# Patient Record
Sex: Female | Born: 1960 | Race: White | Hispanic: No | Smoking: Never smoker
Health system: Southern US, Community
[De-identification: ages and names within clinical notes are randomized; demographics above are authoritative.]

## PROBLEM LIST (undated history)

## (undated) DIAGNOSIS — E876 Hypokalemia: Secondary | ICD-10-CM

## (undated) DIAGNOSIS — I428 Other cardiomyopathies: Secondary | ICD-10-CM

## (undated) DIAGNOSIS — I10 Essential (primary) hypertension: Secondary | ICD-10-CM

## (undated) DIAGNOSIS — I4901 Ventricular fibrillation: Secondary | ICD-10-CM

## (undated) DIAGNOSIS — K589 Irritable bowel syndrome without diarrhea: Secondary | ICD-10-CM

## (undated) DIAGNOSIS — I2510 Atherosclerotic heart disease of native coronary artery without angina pectoris: Secondary | ICD-10-CM

## (undated) DIAGNOSIS — R609 Edema, unspecified: Secondary | ICD-10-CM

## (undated) DIAGNOSIS — R232 Flushing: Secondary | ICD-10-CM

## (undated) DIAGNOSIS — R110 Nausea: Secondary | ICD-10-CM

## (undated) DIAGNOSIS — G43909 Migraine, unspecified, not intractable, without status migrainosus: Secondary | ICD-10-CM

## (undated) HISTORY — DX: Essential (primary) hypertension: I10

## (undated) HISTORY — DX: Nausea: R11.0

## (undated) HISTORY — DX: Other cardiomyopathies: I42.8

## (undated) HISTORY — DX: Edema, unspecified: R60.9

## (undated) HISTORY — DX: Irritable bowel syndrome, unspecified: K58.9

## (undated) HISTORY — DX: Flushing: R23.2

## (undated) HISTORY — DX: Migraine, unspecified, not intractable, without status migrainosus: G43.909

## (undated) HISTORY — DX: Hypokalemia: E87.6

---
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
## 1982-07-08 HISTORY — PX: CHOLECYSTECTOMY: SHX55

## 2010-11-01 DIAGNOSIS — H5210 Myopia, unspecified eye: Secondary | ICD-10-CM | POA: Insufficient documentation

## 2010-11-01 DIAGNOSIS — IMO0002 Reserved for concepts with insufficient information to code with codable children: Secondary | ICD-10-CM | POA: Insufficient documentation

## 2010-11-01 DIAGNOSIS — H524 Presbyopia: Secondary | ICD-10-CM | POA: Insufficient documentation

## 2010-11-02 DIAGNOSIS — H43819 Vitreous degeneration, unspecified eye: Secondary | ICD-10-CM | POA: Insufficient documentation

## 2010-11-02 DIAGNOSIS — H25019 Cortical age-related cataract, unspecified eye: Secondary | ICD-10-CM | POA: Insufficient documentation

## 2011-08-23 DIAGNOSIS — Z01419 Encounter for gynecological examination (general) (routine) without abnormal findings: Secondary | ICD-10-CM | POA: Insufficient documentation

## 2012-08-13 DIAGNOSIS — H4010X0 Unspecified open-angle glaucoma, stage unspecified: Secondary | ICD-10-CM | POA: Insufficient documentation

## 2015-01-30 ENCOUNTER — Other Ambulatory Visit: Payer: Self-pay | Admitting: Family Medicine

## 2015-01-30 ENCOUNTER — Ambulatory Visit
Admission: RE | Admit: 2015-01-30 | Discharge: 2015-01-30 | Disposition: A | Discharge status: Home / Self Care | Payer: BC Managed Care – PPO | Source: Ambulatory Visit | Attending: Family Medicine | Admitting: Family Medicine

## 2015-01-30 DIAGNOSIS — M25472 Effusion, left ankle: Secondary | ICD-10-CM

## 2015-01-30 DIAGNOSIS — M7989 Other specified soft tissue disorders: Secondary | ICD-10-CM

## 2016-08-13 ENCOUNTER — Encounter: Payer: Self-pay | Admitting: Cardiovascular Disease

## 2016-08-16 ENCOUNTER — Ambulatory Visit (INDEPENDENT_AMBULATORY_CARE_PROVIDER_SITE_OTHER): Payer: BC Managed Care – PPO | Admitting: Cardiovascular Disease

## 2016-08-16 ENCOUNTER — Encounter: Payer: Self-pay | Admitting: Cardiovascular Disease

## 2016-08-16 VITALS — BP 138/84 | HR 50 | Ht 60.0 in | Wt 195.5 lb

## 2016-08-16 DIAGNOSIS — E6609 Other obesity due to excess calories: Secondary | ICD-10-CM | POA: Diagnosis not present

## 2016-08-16 DIAGNOSIS — R609 Edema, unspecified: Secondary | ICD-10-CM

## 2016-08-16 DIAGNOSIS — Z6835 Body mass index (BMI) 35.0-35.9, adult: Secondary | ICD-10-CM

## 2016-08-16 DIAGNOSIS — R110 Nausea: Secondary | ICD-10-CM | POA: Diagnosis not present

## 2016-08-16 DIAGNOSIS — E66812 Obesity, class 2: Secondary | ICD-10-CM

## 2016-08-16 DIAGNOSIS — I10 Essential (primary) hypertension: Secondary | ICD-10-CM

## 2016-08-16 MED ORDER — OLMESARTAN MEDOXOMIL 40 MG PO TABS: 40.0000 mg | ORAL_TABLET | Freq: Every day | ORAL | 11 refills | Status: DC

## 2016-08-16 MED ORDER — HYDROCHLOROTHIAZIDE 25 MG PO TABS: 25.0000 mg | ORAL_TABLET | Freq: Every day | ORAL | 3 refills | Status: DC

## 2016-08-16 NOTE — Progress Notes (Addendum)
Cardiology Office Note  Date:  08/16/2016   ID:  Miranda [name], DOB 1961-01-02, MRN [id number]  PCP:  Miranda Hammans, MD   Chief Complaint  Patient presents with  . other    New Patient. per [facility] for controlled HTN, hx of edema. Pt c/o stomach aches/fatigue-she believes is caused by chlorthalidone. Reviewed meds with pt verbally.    HPI:  Miranda [name] Is a very pleasant 56 year old woman with history of hypertension, who presents by referral from Miranda [name], [facility], for consultation of her hypertension, leg swelling. 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 [employer]  She reports long history of leg swelling, possibly exacerbated by amlodipine Symptoms improved with HCTZ She was changed to chlorthalidone for better blood pressure control This regimen has worked particularly in the past week but she reports having abdominal discomfort, cramping on the chlorthalidone, worsening fatigue She would like to change the chlorthalidone  She reports having allergy to ACE inhibitor with cough and hives No significant allergy or intolerance to ARB's, reports they did not work as well She would like to take as few medications as possible  Reports cholesterols typically well controlled Recently has not had any problems with leg swelling, wears compression hose daily as she is on her feet all day  She denies any significant chest pressure, shortness of breath on exertion With strenuous activity, she does have some shortness of breath which she attributes to her weight and conditioning  She has never smoked, no diabetes  Long discussion concerning her family history, father was a heavy smoker, had multiple strokes, cardiomyopathy  EKG on today's visit shows sinus bradycardia rate 50 bpm, no significant ST or T-wave changes  PMH:   has a past medical history of Dependent edema; Hot flashes; HTN (hypertension); Migraines; and Nausea.  PSH:    Past  Surgical History:  Procedure Laterality Date  . APPENDECTOMY  1974  . CHOLECYSTECTOMY  1984  . TONSILLECTOMY  1964    Current Outpatient Prescriptions  Medication Sig Dispense Refill  . acetaminophen (TYLENOL) 500 MG tablet Take 1,000 mg by mouth every 6 (six) hours as needed.    Marland Kitchen amLODipine (NORVASC) 10 MG tablet Take 10 mg by mouth daily.    . chlorthalidone (HYGROTON) 25 MG tablet Take 25 mg by mouth daily.    Marland Kitchen ibuprofen (ADVIL,MOTRIN) 200 MG tablet Take 600 mg by mouth every 6 (six) hours as needed.    . ondansetron (ZOFRAN-ODT) 4 MG disintegrating tablet Take 4 mg by mouth every 8 (eight) hours as needed for nausea.     No current facility-administered medications for this visit.      Allergies:   Ace inhibitors; Angiotensin receptor blockers; Compazine [prochlorperazine edisylate]; Lisinopril; and Penicillins   Social History:  The patient  reports that she has never smoked. She has never used smokeless tobacco. She reports that she does not drink alcohol or use drugs.   Family History:   family history includes Heart disease in her father; Hyperlipidemia in her father; Hypertension in her brother, father, and sister; Lymphoma in her mother; Migraines in her sister; Stroke in her father.    Review of Systems: Review of Systems  Constitutional: Negative.   Respiratory: Negative.   Cardiovascular: Positive for leg swelling.  Gastrointestinal: Negative.        Abdominal cramping  Musculoskeletal: Negative.   Neurological: Negative.   Psychiatric/Behavioral: Negative.   All other systems reviewed and are negative.  PHYSICAL EXAM: VS:  BP 138/84 (BP Location: Left Arm, Patient Position: Sitting, Cuff Size: Normal)   Pulse (!) 50   Ht 5' (1.524 m)   Wt 195 lb 8 oz (88.7 kg)   BMI 38.18 kg/m  , BMI Body mass index is 38.18 kg/m. GEN: Well nourished, well developed, in no acute distress, obese  HEENT: normal  Neck: no JVD, carotid bruits, or masses Cardiac: RRR;  no murmurs, rubs, or gallops,no edema  Respiratory:  clear to auscultation bilaterally, normal work of breathing GI: soft, nontender, nondistended, + BS MS: no deformity or atrophy  Skin: warm and dry, no rash Neuro:  Strength and sensation are intact Psych: euthymic mood, full affect    Recent Labs: No results found for requested labs within last 8760 hours.    Lipid Panel No results found for: CHOL, HDL, LDLCALC, TRIG    Wt Readings from Last 3 Encounters:  08/16/16 195 lb 8 oz (88.7 kg)       ASSESSMENT AND PLAN:  Hypertension, unspecified type - Plan: EKG 12-Lead Long discussion concerning her blood pressure regimen We discussed various intolerances and allergies from the past Ideally she would like to be on as few pills as possible 1 possible regiment would be the triple combination pill amlodipine 100 with thiazide with ARB for example She would be interested in taking one pill per day for blood pressure and willing to retry ARB. She does not like chlorthalidone, feels it has terrible side effects of stomach cramping and fatigue. At her request we will stop the chlorthalidone, restart amlodipine 10 mill grams daily, HCTZ 25 mill grams daily Recommended she start Benicar 20 mg daily for the first week,  If blood pressure runs high, would increase up to Benicar 40 mg daily If blood pressure well controlled on the 3 blood pressure medications, would write prescription for triple combination pill if she has side effects, could try an alternate medication in various other triple combination pills (telmisartan, valsartan for example).   Edema, unspecified type - Plan: EKG 12-Lead Lower extremity edema likely from venous insufficiency, improved with diuretics and compression hose . We did offer echocardiogram if symptoms get worse .she can call us to schedule this at any time . No significant murmur, EKG essentially benign  Less likely pulmonary hypertension  We will check  basic metabolic panel today as she's been on chlorthalidone for 3 weeks    Nausea Takes nausea medications periodically, etiology unclear   Total encounter time more than 60 minutes  Greater than 50% was spent in counseling and coordination of care with the patient   Disposition:   F/U  as needed   Orders Placed This Encounter  Procedures  . EKG 12-Lead     Signed, Dossie Arbour, M.D., Ph.D. 08/16/2016  [facility] [place], [place] [phone] \

## 2016-08-16 NOTE — Patient Instructions (Addendum)
Medication Instructions:   Please continue amlodipine 10 mg daily, HCTZ 25 mg daily Start benicar 1/2 pill per day for 5 days If blood pressure runs high, increase the benicar up to a full pill  Labwork:  We will check BMP today  Testing/Procedures:  No further testing at this time   I recommend watching educational videos on topics of interest to you at:       [email or web address]  Enter code: HEARTCARE    Follow-Up: It was a pleasure seeing you in the office today. Please call us if you have new issues that need to be addressed before your next appt.  [phone]  Your physician wants you to follow-up in: as needed  If you need a refill on your cardiac medications before your next appointment, please call your pharmacy.

## 2016-08-20 ENCOUNTER — Other Ambulatory Visit: Payer: Self-pay

## 2016-08-20 LAB — BASIC METABOLIC PANEL
BUN / CREAT RATIO: 25 — AB (ref 9–23)
BUN: 17 mg/dL (ref 6–24)
CO2: 27 mmol/L (ref 18–29)
CREATININE: 0.69 mg/dL (ref 0.57–1.00)
Calcium: 9.7 mg/dL (ref 8.7–10.2)
Chloride: 97 mmol/L (ref 96–106)
GFR, EST AFRICAN AMERICAN: 112 mL/min/{1.73_m2} (ref >59)
GFR, EST NON AFRICAN AMERICAN: 97 mL/min/{1.73_m2} (ref >59)
Glucose: 93 mg/dL (ref 65–99)
Potassium: 3.5 mmol/L (ref 3.5–5.2)
SODIUM: 141 mmol/L (ref 134–144)

## 2016-08-20 MED ORDER — OLMESARTAN-AMLODIPINE-HCTZ 40-10-25 MG PO TABS: 1.0000 | ORAL_TABLET | Freq: Every day | ORAL | 6 refills | Status: DC

## 2016-08-20 MED ORDER — POTASSIUM CHLORIDE ER 10 MEQ PO TBCR: 10.0000 meq | EXTENDED_RELEASE_TABLET | Freq: Every day | ORAL | 6 refills | Status: DC | PRN

## 2017-01-15 ENCOUNTER — Telehealth: Payer: Self-pay | Admitting: Cardiovascular Disease

## 2017-01-15 NOTE — Telephone Encounter (Signed)
°*  STAT* If patient is at the pharmacy, call can be transferred to refill team.   1. Which medications need to be refilled? (please list name of each medication and dose if known) Benicar 40 mg po daily  2. Which pharmacy/location (including street and city if local pharmacy) is medication to be sent to? [facility] [address]   3. Do they need a 30 day or 90 day supply? 90

## 2017-01-16 ENCOUNTER — Other Ambulatory Visit: Payer: Self-pay | Admitting: *Deleted

## 2017-01-16 MED ORDER — OLMESARTAN MEDOXOMIL 40 MG PO TABS: 40.0000 mg | ORAL_TABLET | Freq: Every day | ORAL | 3 refills | Status: DC

## 2017-01-16 NOTE — Telephone Encounter (Signed)
Benicar 40 mg tablet sent to [facility]. #[phone] #[id number]

## 2017-02-25 ENCOUNTER — Emergency Department: Payer: BC Managed Care – PPO

## 2017-02-25 ENCOUNTER — Inpatient Hospital Stay
Admission: EM | Admit: 2017-02-25 | Discharge: 2017-03-14 | DRG: 286 | Disposition: A | Discharge status: Other Acute Inpatient Hospital | Payer: BC Managed Care – PPO | Attending: Internal Medicine | Admitting: Internal Medicine

## 2017-02-25 DIAGNOSIS — Z79899 Other long term (current) drug therapy: Secondary | ICD-10-CM

## 2017-02-25 DIAGNOSIS — I472 Ventricular tachycardia: Secondary | ICD-10-CM | POA: Diagnosis present

## 2017-02-25 DIAGNOSIS — R739 Hyperglycemia, unspecified: Secondary | ICD-10-CM | POA: Diagnosis present

## 2017-02-25 DIAGNOSIS — Z452 Encounter for adjustment and management of vascular access device: Secondary | ICD-10-CM

## 2017-02-25 DIAGNOSIS — J9690 Respiratory failure, unspecified, unspecified whether with hypoxia or hypercapnia: Secondary | ICD-10-CM

## 2017-02-25 DIAGNOSIS — R402122 Coma scale, eyes open, to pain, at arrival to emergency department: Secondary | ICD-10-CM | POA: Diagnosis present

## 2017-02-25 DIAGNOSIS — I4901 Ventricular fibrillation: Principal | ICD-10-CM | POA: Diagnosis present

## 2017-02-25 DIAGNOSIS — I469 Cardiac arrest, cause unspecified: Secondary | ICD-10-CM

## 2017-02-25 DIAGNOSIS — J0410 Acute tracheitis without obstruction: Secondary | ICD-10-CM | POA: Diagnosis not present

## 2017-02-25 DIAGNOSIS — G931 Anoxic brain damage, not elsewhere classified: Secondary | ICD-10-CM

## 2017-02-25 DIAGNOSIS — Z888 Allergy status to other drugs, medicaments and biological substances status: Secondary | ICD-10-CM

## 2017-02-25 DIAGNOSIS — J810 Acute pulmonary edema: Secondary | ICD-10-CM

## 2017-02-25 DIAGNOSIS — Z6833 Body mass index (BMI) 33.0-33.9, adult: Secondary | ICD-10-CM

## 2017-02-25 DIAGNOSIS — Z66 Do not resuscitate: Secondary | ICD-10-CM | POA: Diagnosis present

## 2017-02-25 DIAGNOSIS — J9600 Acute respiratory failure, unspecified whether with hypoxia or hypercapnia: Secondary | ICD-10-CM

## 2017-02-25 DIAGNOSIS — I2510 Atherosclerotic heart disease of native coronary artery without angina pectoris: Secondary | ICD-10-CM | POA: Diagnosis present

## 2017-02-25 DIAGNOSIS — E876 Hypokalemia: Secondary | ICD-10-CM

## 2017-02-25 DIAGNOSIS — Z8249 Family history of ischemic heart disease and other diseases of the circulatory system: Secondary | ICD-10-CM

## 2017-02-25 DIAGNOSIS — R570 Cardiogenic shock: Secondary | ICD-10-CM

## 2017-02-25 DIAGNOSIS — N170 Acute kidney failure with tubular necrosis: Secondary | ICD-10-CM | POA: Diagnosis present

## 2017-02-25 DIAGNOSIS — Z9911 Dependence on respirator [ventilator] status: Secondary | ICD-10-CM

## 2017-02-25 DIAGNOSIS — I110 Hypertensive heart disease with heart failure: Secondary | ICD-10-CM | POA: Diagnosis present

## 2017-02-25 DIAGNOSIS — J9601 Acute respiratory failure with hypoxia: Secondary | ICD-10-CM | POA: Diagnosis present

## 2017-02-25 DIAGNOSIS — Z4659 Encounter for fitting and adjustment of other gastrointestinal appliance and device: Secondary | ICD-10-CM

## 2017-02-25 DIAGNOSIS — E669 Obesity, unspecified: Secondary | ICD-10-CM | POA: Diagnosis present

## 2017-02-25 DIAGNOSIS — R402332 Coma scale, best motor response, abnormal, at arrival to emergency department: Secondary | ICD-10-CM | POA: Diagnosis present

## 2017-02-25 DIAGNOSIS — Z880 Allergy status to penicillin: Secondary | ICD-10-CM

## 2017-02-25 DIAGNOSIS — R001 Bradycardia, unspecified: Secondary | ICD-10-CM

## 2017-02-25 DIAGNOSIS — R402212 Coma scale, best verbal response, none, at arrival to emergency department: Secondary | ICD-10-CM | POA: Diagnosis present

## 2017-02-25 DIAGNOSIS — K529 Noninfective gastroenteritis and colitis, unspecified: Secondary | ICD-10-CM | POA: Diagnosis present

## 2017-02-25 DIAGNOSIS — I5023 Acute on chronic systolic (congestive) heart failure: Secondary | ICD-10-CM | POA: Diagnosis present

## 2017-02-25 DIAGNOSIS — J1520 Pneumonia due to staphylococcus, unspecified: Secondary | ICD-10-CM | POA: Diagnosis present

## 2017-02-25 DIAGNOSIS — E872 Acidosis: Secondary | ICD-10-CM | POA: Diagnosis present

## 2017-02-25 DIAGNOSIS — T884XXA Failed or difficult intubation, initial encounter: Secondary | ICD-10-CM | POA: Diagnosis present

## 2017-02-25 DIAGNOSIS — D649 Anemia, unspecified: Secondary | ICD-10-CM | POA: Diagnosis present

## 2017-02-25 LAB — CBC WITH DIFFERENTIAL/PLATELET
BASOS ABS: 0.1 10*3/uL (ref 0–0.1)
BASOS PCT: 1 %
EOS PCT: 1 %
Eosinophils Absolute: 0.2 10*3/uL (ref 0–0.7)
HCT: 39.1 % (ref 35.0–47.0)
Hemoglobin: 13.4 g/dL (ref 12.0–16.0)
Lymphocytes Relative: 48 %
Lymphs Abs: 9.3 10*3/uL — ABNORMAL HIGH (ref 1.0–3.6)
MCH: 30.6 pg (ref 26.0–34.0)
MCHC: 34.2 g/dL (ref 32.0–36.0)
MCV: 89.5 fL (ref 80.0–100.0)
MONO ABS: 1.8 10*3/uL — AB (ref 0.2–0.9)
Monocytes Relative: 9 %
Neutro Abs: 7.8 10*3/uL — ABNORMAL HIGH (ref 1.4–6.5)
Neutrophils Relative %: 41 %
PLATELETS: 511 10*3/uL — AB (ref 150–440)
RBC: 4.37 MIL/uL (ref 3.80–5.20)
RDW: 12.6 % (ref 11.5–14.5)
WBC: 19.2 10*3/uL — ABNORMAL HIGH (ref 3.6–11.0)

## 2017-02-25 LAB — PROTIME-INR
INR: 1.07
PROTHROMBIN TIME: 13.9 s (ref 11.4–15.2)

## 2017-02-25 LAB — BASIC METABOLIC PANEL
ANION GAP: 18 — AB (ref 5–15)
BUN: 19 mg/dL (ref 6–20)
CALCIUM: 9.4 mg/dL (ref 8.9–10.3)
CO2: 22 mmol/L (ref 22–32)
CREATININE: 1.14 mg/dL — AB (ref 0.44–1.00)
Chloride: 101 mmol/L (ref 101–111)
GFR, EST NON AFRICAN AMERICAN: 52 mL/min — AB (ref >60)
GLUCOSE: 284 mg/dL — AB (ref 65–99)
Potassium: 2.9 mmol/L — ABNORMAL LOW (ref 3.5–5.1)
Sodium: 141 mmol/L (ref 135–145)

## 2017-02-25 LAB — TROPONIN I: Troponin I: 0.03 ng/mL (ref <0.03)

## 2017-02-25 LAB — LACTIC ACID, PLASMA: LACTIC ACID, VENOUS: 7.2 mmol/L — AB (ref 0.5–1.9)

## 2017-02-25 MED ORDER — SODIUM CHLORIDE 0.9 % IV BOLUS (SEPSIS)
1000.0000 mL | Freq: Once | INTRAVENOUS | Status: AC
  Administered 2017-02-25: 1000 mL via INTRAVENOUS

## 2017-02-25 MED ORDER — AMIODARONE HCL IN DEXTROSE 360-4.14 MG/200ML-% IV SOLN
60.0000 mg/h | Freq: Once | INTRAVENOUS | Status: AC
Start: 2017-02-25 — End: 2017-02-25
  Administered 2017-02-25: 60 mg/h via INTRAVENOUS

## 2017-02-25 MED ORDER — IOPAMIDOL (ISOVUE-370) INJECTION 76%
100.0000 mL | Freq: Once | INTRAVENOUS | Status: AC | PRN
  Administered 2017-02-25: 100 mL via INTRAVENOUS

## 2017-02-25 MED ORDER — PROPOFOL 1000 MG/100ML IV EMUL
5.0000 ug/kg/min | INTRAVENOUS | Status: DC
  Administered 2017-02-25: 10 ug/min via INTRAVENOUS

## 2017-02-25 MED ORDER — MIDAZOLAM HCL 2 MG/2ML IJ SOLN: 2.0000 mg | Freq: Once | INTRAMUSCULAR | Status: DC

## 2017-02-25 MED ORDER — AMIODARONE HCL IN DEXTROSE 360-4.14 MG/200ML-% IV SOLN
INTRAVENOUS | Status: AC
  Filled 2017-02-25: qty 200

## 2017-02-25 MED ORDER — SUCCINYLCHOLINE CHLORIDE 20 MG/ML IJ SOLN
INTRAMUSCULAR | Status: AC | PRN
  Administered 2017-02-25: 100 mg via INTRAVENOUS

## 2017-02-25 MED ORDER — AMIODARONE HCL 150 MG/3ML IV SOLN: 60.0000 mg/h | Freq: Once | INTRAVENOUS | Status: DC

## 2017-02-25 MED ORDER — PROPOFOL 1000 MG/100ML IV EMUL
INTRAVENOUS | Status: AC
  Administered 2017-02-25: 10 ug/min via INTRAVENOUS
  Filled 2017-02-25: qty 100

## 2017-02-25 MED ORDER — CEFEPIME HCL 2 G IJ SOLR
2.0000 g | Freq: Once | INTRAMUSCULAR | Status: AC
  Administered 2017-02-26: 2 g via INTRAVENOUS
  Filled 2017-02-25: qty 2

## 2017-02-25 MED ORDER — ROCURONIUM BROMIDE 50 MG/5ML IV SOLN
INTRAVENOUS | Status: AC | PRN
  Administered 2017-02-25: 100 mg via INTRAVENOUS

## 2017-02-25 MED ORDER — ETOMIDATE 2 MG/ML IV SOLN
INTRAVENOUS | Status: AC | PRN
  Administered 2017-02-25: 20 mg via INTRAVENOUS

## 2017-02-25 MED ORDER — DEXTROSE 5 % IV SOLN: 60.0000 mg/h | Freq: Once | INTRAVENOUS | Status: DC

## 2017-02-25 NOTE — Progress Notes (Signed)
Transported pt to CT on Vent and returned to ED 10 without incident.

## 2017-02-25 NOTE — ED Notes (Addendum)
Pt currently in CT per RN Demetrio Lapping. Report received from [facility]. Per RN Demetrio Lapping needs temp foley and to be cleaned up upon arrival back to ED room 10. Blood cultures need to be collected as well.

## 2017-02-25 NOTE — ED Provider Notes (Signed)
[facility] Emergency Department Provider Note    First MD Initiated Contact with Patient 02/25/17 2242     (approximate)  I have reviewed the triage vital signs and the nursing notes.   HISTORY  Chief Complaint Cardiac Arrest   Level V Caveat:  Cardiac arest  HPI Miranda [name] is a 56 y.o. female status post witnessed arrest. Patient came in via EMS after early bystander CPR witnessed arrest in the bathroom. Fire department immediately started BLS and did have AED advised 1 shock. They continued CPR. When EMS arrived they continued CPR. They did perform 2 defibrillations for V. fib. On the second defibrillation she had return of spontaneous circulation. She did receive 2 rounds of epinephrine as well as 300 mg of amiodarone. The patient was intubated with a King airway for agonal respirations. There was concern for head injury.  Patient arrives via EMS with agonal respirations. Appears to be posturing. No purposeful movement.  Additional history provided from the husband states the patient has been having several days of profuse diarrhea. She has been taking multiple doses of loperamide at home including 10-20 according to the husband. Has not been taking her blood pressure medications  Past Medical History:  Diagnosis Date  . Dependent edema    bilateral legs  . Hot flashes   . HTN (hypertension)   . Migraines   . Nausea    Family History  Problem Relation Age of Onset  . Lymphoma Mother   . Heart disease Father   . Stroke Father   . Hyperlipidemia Father   . Hypertension Father   . Hypertension Sister   . Migraines Sister   . Hypertension Brother    Past Surgical History:  Procedure Laterality Date  . APPENDECTOMY  1974  . CHOLECYSTECTOMY  1984  . TONSILLECTOMY  1964   Patient Active Problem List   Diagnosis Date Noted  . Hypertension 08/16/2016  . Edema 08/16/2016  . Nausea 08/16/2016  . Class 2 obesity due to excess calories without  serious comorbidity with body mass index (BMI) of 35.0 to 35.9 in adult 08/16/2016      Prior to Admission medications   Medication Sig Start Date End Date Taking? Authorizing Provider  acetaminophen (TYLENOL) 500 MG tablet Take 1,000 mg by mouth every 6 (six) hours as needed.    [provider]  amLODipine (NORVASC) 10 MG tablet Take 10 mg by mouth daily.    [provider]  hydrochlorothiazide (HYDRODIURIL) 25 MG tablet Take 1 tablet (25 mg total) by mouth daily. 08/16/16 11/14/16  Antonieta Iba, MD  ibuprofen (ADVIL,MOTRIN) 200 MG tablet Take 600 mg by mouth every 6 (six) hours as needed.    [provider]  olmesartan (BENICAR) 40 MG tablet Take 1 tablet (40 mg total) by mouth daily. 01/16/17   Antonieta Iba, MD  Olmesartan-Amlodipine-HCTZ 40-10-25 MG TABS Take 1 tablet by mouth daily. 08/20/16   Antonieta Iba, MD  potassium chloride (K-DUR) 10 MEQ tablet Take 1 tablet (10 mEq total) by mouth daily as needed. 08/20/16 11/18/16  Antonieta Iba, MD    Allergies Ace inhibitors; Compazine [prochlorperazine edisylate]; Lisinopril; and Penicillins    Social History Social History  Substance Use Topics  . Smoking status: Never Smoker  . Smokeless tobacco: Never Used  . Alcohol use No    Review of Systems Patient denies headaches, rhinorrhea, blurry vision, numbness, shortness of breath, chest pain, edema, cough, abdominal pain, nausea, vomiting, diarrhea,  dysuria, fevers, rashes or hallucinations unless otherwise stated above in HPI. ____________________________________________   PHYSICAL EXAM:  VITAL SIGNS: Vitals:   02/25/17 2300 02/25/17 2330  BP: 119/87 (!) 80/49  Pulse: (!) 117   Resp: (!) 21   SpO2: 92%     Constitutional: critically ill appearing, king airway in place  Eyes: Conjunctivae are normal. Roving EOMI, pupils 19mm reactive Head: Atraumatic. Nose: No congestion/rhinnorhea. Mouth/Throat: king airway in place, multiple  tongue superficial lacerations Neck: No stridor. Painless ROM.  Cardiovascular: tachycardic, regular rhythm. Grossly normal heart sounds.   Respiratory: agonal respirations, rhonchorus breathsounds throughout, Gastrointestinal: Soft and nontender. No distention. No abdominal bruits. No CVA tenderness. Musculoskeletal: No lower extremity tenderness nor edema.  No joint effusions. Neurologic:  GCS, 4,1,2,  Skin:  Skin is cool and dry, abrasion from lucas device on anterior chest wall Psychiatric: NA  ____________________________________________   LABS (all labs ordered are listed, but only abnormal results are displayed)  Results for orders placed or performed during the hospital encounter of 02/25/17 (from the past 24 hour(s))  CBC with Differential/Platelet     Status: Abnormal   Collection Time: 02/25/17 10:40 PM  Result Value Ref Range   WBC 19.2 (H) 3.6 - 11.0 K/uL   RBC 4.37 3.80 - 5.20 MIL/uL   Hemoglobin 13.4 12.0 - 16.0 g/dL   HCT 02.2 33.6 - 12.2 %   MCV 89.5 80.0 - 100.0 fL   MCH 30.6 26.0 - 34.0 pg   MCHC 34.2 32.0 - 36.0 g/dL   RDW 44.9 75.3 - 00.5 %   Platelets 511 (H) 150 - 440 K/uL   Neutrophils Relative % 41 %   Neutro Abs 7.8 (H) 1.4 - 6.5 K/uL   Lymphocytes Relative 48 %   Lymphs Abs 9.3 (H) 1.0 - 3.6 K/uL   Monocytes Relative 9 %   Monocytes Absolute 1.8 (H) 0.2 - 0.9 K/uL   Eosinophils Relative 1 %   Eosinophils Absolute 0.2 0 - 0.7 K/uL   Basophils Relative 1 %   Basophils Absolute 0.1 0 - 0.1 K/uL  Basic metabolic panel     Status: Abnormal   Collection Time: 02/25/17 10:40 PM  Result Value Ref Range   Sodium 141 135 - 145 mmol/L   Potassium 2.9 (L) 3.5 - 5.1 mmol/L   Chloride 101 101 - 111 mmol/L   CO2 22 22 - 32 mmol/L   Glucose, Bld 284 (H) 65 - 99 mg/dL   BUN 19 6 - 20 mg/dL   Creatinine, Ser 1.10 (H) 0.44 - 1.00 mg/dL   Calcium 9.4 8.9 - 21.1 mg/dL   GFR calc non Af Amer 52 (L) >60 mL/min   GFR calc Af Amer >60 >60 mL/min   Anion gap 18 (H)  5 - 15  Troponin I     Status: Abnormal   Collection Time: 02/25/17 10:40 PM  Result Value Ref Range   Troponin I 0.03 (HH) <0.03 ng/mL  Lactic acid, plasma     Status: Abnormal   Collection Time: 02/25/17 10:40 PM  Result Value Ref Range   Lactic Acid, Venous 7.2 (HH) 0.5 - 1.9 mmol/L  Protime-INR     Status: None   Collection Time: 02/25/17 10:40 PM  Result Value Ref Range   Prothrombin Time 13.9 11.4 - 15.2 seconds   INR 1.07    ____________________________________________  EKG My review and personal interpretation at Time: 22:38   Indication: cardiac arrest  Rate: 115  Rhythm: sinus Axis: normal  Other: no stemi but diffuse st changes and depressions ____________________________________________  RADIOLOGY  I personally reviewed all radiographic images ordered to evaluate for the above acute complaints and reviewed radiology reports and findings.  These findings were personally discussed with the patient.  Please see medical record for radiology report.  ____________________________________________   PROCEDURES  Procedure(s) performed:  Procedure Name: Intubation Date/Time: 02/26/2017 12:09 AM Performed by: Willy Eddy Pre-anesthesia Checklist: Patient identified, Emergency Drugs available, Suction available and Patient being monitored Oxygen Delivery Method: Ambu bag Preoxygenation: Pre-oxygenation with 100% oxygen Induction Type: Rapid sequence Laryngoscope Size: Glidescope and 3 Grade View: Grade III Tube size: 7.5 mm Number of attempts: 1 Airway Equipment and Method: Stylet and Video-laryngoscopy Placement Confirmation: ETT inserted through vocal cords under direct vision,  Positive ETCO2,  Breath sounds checked- equal and bilateral and CO2 detector Secured at: 22 cm Tube secured with: ETT holder         Critical Care performed: yes CRITICAL CARE Performed by: Willy Eddy   Total critical care time: 50 minutes  Critical care time was  exclusive of separately billable procedures and treating other patients.  Critical care was necessary to treat or prevent imminent or life-threatening deterioration.  Critical care was time spent personally by me on the following activities: development of treatment plan with patient and/or surrogate as well as nursing, discussions with consultants, evaluation of patient's response to treatment, examination of patient, obtaining history from patient or surrogate, ordering and performing treatments and interventions, ordering and review of laboratory studies, ordering and review of radiographic studies, pulse oximetry and re-evaluation of patient's condition.  ____________________________________________   INITIAL IMPRESSION / ASSESSMENT AND PLAN / ED COURSE  Pertinent labs & imaging results that were available during my care of the patient were reviewed by me and considered in my medical decision making (see chart for details).  DDX: arrest, pna, aspiration, overdose, head bleed, pe, dissection, acs  Jilleen Essner is a 56 y.o. who presents to the ED with cardiac arrest as described above. Glucose normal. Patient with advanced airway placed upon arrival to the ER as she was having agonal respirations and copious secretions around the [facility] airway. She did have a spontaneous circulation. After intubation stat EKG showed ST depressions but no STEMI. She was taken immediately to CAT scan to rule out ICH or dissection or pulmonary embolism. No evidence of ICH. No evidence of dissection. Diffuse edema likely secondary to aspiration versus contusion or edema from cardiogenic shock. Patient does have mild hypokalemia which is repleted IV. Patient given dose of IV antibiotics post arrest. Patient started on amiodarone drip status post V. fib arrest. I spoke with cardiology regarding her presentation and is she had early bystander CPR with a V. fib arrest do feel that cardiac catheter is clinically indicated.  Patient will be admitted to the ICU. She is in critical condition.      ____________________________________________   FINAL CLINICAL IMPRESSION(S) / ED DIAGNOSES  Final diagnoses:  Cardiac arrest with ventricular fibrillation (HCC)  Acute respiratory failure, unspecified whether with hypoxia or hypercapnia (HCC)  Hypokalemia      NEW MEDICATIONS STARTED DURING THIS VISIT:  New Prescriptions   No medications on file     Note:  This document was prepared using Dragon voice recognition software and may include unintentional dictation errors.    Willy Eddy, MD 02/26/17 [phone]

## 2017-02-25 NOTE — ED Triage Notes (Signed)
Pt arrived via EMS from home unresponsive. Husband heard a fall in the bathroom and found her laying on the floor while she was having a bowel movement. Pt was unresponsive. Husband started CPR because she had no pulses and was not breathing. EMS delivered 2 shocks and fire dept delivered 1 shock. Pt received 300 amiodarone, 1 Epinephrine, and 2 narcan via EMS. Pt received 18G in the left [facility] via EMS. Pt has a Hx of HTN. Pt has been sinus tachy since shocks were delivered. Pt CBG- 246.

## 2017-02-25 NOTE — ED Notes (Signed)
97.7 rectal temp 

## 2017-02-26 ENCOUNTER — Encounter: Payer: Self-pay | Admitting: Internal Medicine

## 2017-02-26 ENCOUNTER — Encounter
Admission: EM | Disposition: A | Discharge status: Other Acute Inpatient Hospital | Payer: Self-pay | Source: Home / Self Care | Attending: Internal Medicine

## 2017-02-26 ENCOUNTER — Inpatient Hospital Stay (HOSPITAL_COMMUNITY)
Admit: 2017-02-26 | Discharge: 2017-02-26 | Disposition: A | Discharge status: Home / Self Care | Payer: BC Managed Care – PPO | Attending: Internal Medicine | Admitting: Internal Medicine

## 2017-02-26 ENCOUNTER — Inpatient Hospital Stay: Payer: BC Managed Care – PPO

## 2017-02-26 DIAGNOSIS — I5023 Acute on chronic systolic (congestive) heart failure: Secondary | ICD-10-CM | POA: Diagnosis present

## 2017-02-26 DIAGNOSIS — I340 Nonrheumatic mitral (valve) insufficiency: Secondary | ICD-10-CM

## 2017-02-26 DIAGNOSIS — I10 Essential (primary) hypertension: Secondary | ICD-10-CM | POA: Diagnosis not present

## 2017-02-26 DIAGNOSIS — J810 Acute pulmonary edema: Secondary | ICD-10-CM | POA: Diagnosis not present

## 2017-02-26 DIAGNOSIS — Z888 Allergy status to other drugs, medicaments and biological substances status: Secondary | ICD-10-CM | POA: Diagnosis not present

## 2017-02-26 DIAGNOSIS — J1520 Pneumonia due to staphylococcus, unspecified: Secondary | ICD-10-CM | POA: Diagnosis present

## 2017-02-26 DIAGNOSIS — E876 Hypokalemia: Secondary | ICD-10-CM | POA: Diagnosis present

## 2017-02-26 DIAGNOSIS — G931 Anoxic brain damage, not elsewhere classified: Secondary | ICD-10-CM

## 2017-02-26 DIAGNOSIS — R531 Weakness: Secondary | ICD-10-CM | POA: Diagnosis not present

## 2017-02-26 DIAGNOSIS — E669 Obesity, unspecified: Secondary | ICD-10-CM | POA: Diagnosis present

## 2017-02-26 DIAGNOSIS — Z452 Encounter for adjustment and management of vascular access device: Secondary | ICD-10-CM

## 2017-02-26 DIAGNOSIS — I351 Nonrheumatic aortic (valve) insufficiency: Secondary | ICD-10-CM | POA: Diagnosis not present

## 2017-02-26 DIAGNOSIS — I2510 Atherosclerotic heart disease of native coronary artery without angina pectoris: Secondary | ICD-10-CM | POA: Diagnosis present

## 2017-02-26 DIAGNOSIS — R402212 Coma scale, best verbal response, none, at arrival to emergency department: Secondary | ICD-10-CM | POA: Diagnosis present

## 2017-02-26 DIAGNOSIS — I428 Other cardiomyopathies: Secondary | ICD-10-CM

## 2017-02-26 DIAGNOSIS — Z79899 Other long term (current) drug therapy: Secondary | ICD-10-CM | POA: Diagnosis not present

## 2017-02-26 DIAGNOSIS — R570 Cardiogenic shock: Secondary | ICD-10-CM | POA: Diagnosis present

## 2017-02-26 DIAGNOSIS — J9601 Acute respiratory failure with hypoxia: Secondary | ICD-10-CM

## 2017-02-26 DIAGNOSIS — Z8249 Family history of ischemic heart disease and other diseases of the circulatory system: Secondary | ICD-10-CM | POA: Diagnosis not present

## 2017-02-26 DIAGNOSIS — R402332 Coma scale, best motor response, abnormal, at arrival to emergency department: Secondary | ICD-10-CM | POA: Diagnosis present

## 2017-02-26 DIAGNOSIS — Z9911 Dependence on respirator [ventilator] status: Secondary | ICD-10-CM | POA: Diagnosis not present

## 2017-02-26 DIAGNOSIS — I472 Ventricular tachycardia: Secondary | ICD-10-CM | POA: Diagnosis present

## 2017-02-26 DIAGNOSIS — I469 Cardiac arrest, cause unspecified: Secondary | ICD-10-CM | POA: Diagnosis present

## 2017-02-26 DIAGNOSIS — N170 Acute kidney failure with tubular necrosis: Secondary | ICD-10-CM | POA: Diagnosis present

## 2017-02-26 DIAGNOSIS — R4182 Altered mental status, unspecified: Secondary | ICD-10-CM | POA: Diagnosis not present

## 2017-02-26 DIAGNOSIS — I4901 Ventricular fibrillation: Principal | ICD-10-CM

## 2017-02-26 DIAGNOSIS — I110 Hypertensive heart disease with heart failure: Secondary | ICD-10-CM | POA: Diagnosis present

## 2017-02-26 DIAGNOSIS — D649 Anemia, unspecified: Secondary | ICD-10-CM | POA: Diagnosis present

## 2017-02-26 DIAGNOSIS — R739 Hyperglycemia, unspecified: Secondary | ICD-10-CM | POA: Diagnosis present

## 2017-02-26 DIAGNOSIS — R001 Bradycardia, unspecified: Secondary | ICD-10-CM | POA: Diagnosis not present

## 2017-02-26 DIAGNOSIS — I5021 Acute systolic (congestive) heart failure: Secondary | ICD-10-CM | POA: Diagnosis not present

## 2017-02-26 DIAGNOSIS — J9600 Acute respiratory failure, unspecified whether with hypoxia or hypercapnia: Secondary | ICD-10-CM | POA: Diagnosis not present

## 2017-02-26 DIAGNOSIS — Z880 Allergy status to penicillin: Secondary | ICD-10-CM | POA: Diagnosis not present

## 2017-02-26 DIAGNOSIS — I420 Dilated cardiomyopathy: Secondary | ICD-10-CM | POA: Diagnosis not present

## 2017-02-26 DIAGNOSIS — R402122 Coma scale, eyes open, to pain, at arrival to emergency department: Secondary | ICD-10-CM | POA: Diagnosis present

## 2017-02-26 DIAGNOSIS — R197 Diarrhea, unspecified: Secondary | ICD-10-CM | POA: Diagnosis not present

## 2017-02-26 DIAGNOSIS — E872 Acidosis: Secondary | ICD-10-CM | POA: Diagnosis present

## 2017-02-26 DIAGNOSIS — Z6833 Body mass index (BMI) 33.0-33.9, adult: Secondary | ICD-10-CM | POA: Diagnosis not present

## 2017-02-26 HISTORY — PX: LEFT HEART CATH AND CORONARY ANGIOGRAPHY: CATH118249

## 2017-02-26 LAB — PROTIME-INR
INR: 1.18
INR: 1.29
INR: 1.34
PROTHROMBIN TIME: 16.2 s — AB (ref 11.4–15.2)
Prothrombin Time: 15.1 seconds (ref 11.4–15.2)
Prothrombin Time: 16.7 seconds — ABNORMAL HIGH (ref 11.4–15.2)

## 2017-02-26 LAB — BASIC METABOLIC PANEL
ANION GAP: 12 (ref 5–15)
ANION GAP: 12 (ref 5–15)
ANION GAP: 11 (ref 5–15)
Anion gap: 15 (ref 5–15)
Anion gap: 13 (ref 5–15)
Anion gap: 13 (ref 5–15)
Anion gap: 16 — ABNORMAL HIGH (ref 5–15)
BUN: 19 mg/dL (ref 6–20)
BUN: 21 mg/dL — AB (ref 6–20)
BUN: 22 mg/dL — AB (ref 6–20)
BUN: 21 mg/dL — AB (ref 6–20)
BUN: 22 mg/dL — AB (ref 6–20)
BUN: 23 mg/dL — ABNORMAL HIGH (ref 6–20)
BUN: 22 mg/dL — AB (ref 6–20)
CALCIUM: 8.4 mg/dL — AB (ref 8.9–10.3)
CHLORIDE: 104 mmol/L (ref 101–111)
CHLORIDE: 104 mmol/L (ref 101–111)
CHLORIDE: 106 mmol/L (ref 101–111)
CHLORIDE: 108 mmol/L (ref 101–111)
CHLORIDE: 110 mmol/L (ref 101–111)
CO2: 21 mmol/L — ABNORMAL LOW (ref 22–32)
CO2: 23 mmol/L (ref 22–32)
CO2: 19 mmol/L — ABNORMAL LOW (ref 22–32)
CO2: 20 mmol/L — ABNORMAL LOW (ref 22–32)
CO2: 19 mmol/L — ABNORMAL LOW (ref 22–32)
CO2: 19 mmol/L — ABNORMAL LOW (ref 22–32)
CO2: 14 mmol/L — ABNORMAL LOW (ref 22–32)
CREATININE: 0.92 mg/dL (ref 0.44–1.00)
CREATININE: 1.08 mg/dL — AB (ref 0.44–1.00)
CREATININE: 1.26 mg/dL — AB (ref 0.44–1.00)
Calcium: 8.5 mg/dL — ABNORMAL LOW (ref 8.9–10.3)
Calcium: 8.2 mg/dL — ABNORMAL LOW (ref 8.9–10.3)
Calcium: 7.9 mg/dL — ABNORMAL LOW (ref 8.9–10.3)
Calcium: 7.9 mg/dL — ABNORMAL LOW (ref 8.9–10.3)
Calcium: 7.9 mg/dL — ABNORMAL LOW (ref 8.9–10.3)
Calcium: 7.8 mg/dL — ABNORMAL LOW (ref 8.9–10.3)
Chloride: 103 mmol/L (ref 101–111)
Chloride: 104 mmol/L (ref 101–111)
Creatinine, Ser: 1.05 mg/dL — ABNORMAL HIGH (ref 0.44–1.00)
Creatinine, Ser: 1.05 mg/dL — ABNORMAL HIGH (ref 0.44–1.00)
Creatinine, Ser: 1.16 mg/dL — ABNORMAL HIGH (ref 0.44–1.00)
Creatinine, Ser: 1.07 mg/dL — ABNORMAL HIGH (ref 0.44–1.00)
GFR calc Af Amer: >60 mL/min (ref >60)
GFR calc Af Amer: >60 mL/min (ref >60)
GFR calc Af Amer: >60 mL/min (ref >60)
GFR, EST AFRICAN AMERICAN: 53 mL/min — AB (ref >60)
GFR, EST AFRICAN AMERICAN: 59 mL/min — AB (ref >60)
GFR, EST NON AFRICAN AMERICAN: 55 mL/min — AB (ref >60)
GFR, EST NON AFRICAN AMERICAN: 57 mL/min — AB (ref >60)
GFR, EST NON AFRICAN AMERICAN: 46 mL/min — AB (ref >60)
GFR, EST NON AFRICAN AMERICAN: 57 mL/min — AB (ref >60)
GFR, EST NON AFRICAN AMERICAN: 51 mL/min — AB (ref >60)
GFR, EST NON AFRICAN AMERICAN: 56 mL/min — AB (ref >60)
Glucose, Bld: 273 mg/dL — ABNORMAL HIGH (ref 65–99)
Glucose, Bld: 277 mg/dL — ABNORMAL HIGH (ref 65–99)
Glucose, Bld: 354 mg/dL — ABNORMAL HIGH (ref 65–99)
Glucose, Bld: 340 mg/dL — ABNORMAL HIGH (ref 65–99)
Glucose, Bld: 311 mg/dL — ABNORMAL HIGH (ref 65–99)
Glucose, Bld: 319 mg/dL — ABNORMAL HIGH (ref 65–99)
Glucose, Bld: 352 mg/dL — ABNORMAL HIGH (ref 65–99)
POTASSIUM: 3.4 mmol/L — AB (ref 3.5–5.1)
POTASSIUM: 3.4 mmol/L — AB (ref 3.5–5.1)
POTASSIUM: 2.1 mmol/L — AB (ref 3.5–5.1)
POTASSIUM: 3.6 mmol/L (ref 3.5–5.1)
POTASSIUM: 3.8 mmol/L (ref 3.5–5.1)
Potassium: 2.2 mmol/L — CL (ref 3.5–5.1)
Potassium: 2.3 mmol/L — CL (ref 3.5–5.1)
SODIUM: 139 mmol/L (ref 135–145)
SODIUM: 140 mmol/L (ref 135–145)
SODIUM: 136 mmol/L (ref 135–145)
SODIUM: 140 mmol/L (ref 135–145)
SODIUM: 137 mmol/L (ref 135–145)
SODIUM: 139 mmol/L (ref 135–145)
SODIUM: 135 mmol/L (ref 135–145)

## 2017-02-26 LAB — BLOOD GAS, ARTERIAL
ACID-BASE DEFICIT: 6.3 mmol/L — AB (ref 0.0–2.0)
Acid-base deficit: 9 mmol/L — ABNORMAL HIGH (ref 0.0–2.0)
Acid-base deficit: 7.7 mmol/L — ABNORMAL HIGH (ref 0.0–2.0)
Bicarbonate: 17.5 mmol/L — ABNORMAL LOW (ref 20.0–28.0)
Bicarbonate: 18.8 mmol/L — ABNORMAL LOW (ref 20.0–28.0)
Bicarbonate: 19 mmol/L — ABNORMAL LOW (ref 20.0–28.0)
FIO2: 1
FIO2: 1
FIO2: 1
MECHANICAL RATE: 20
MECHVT: 450 mL
Mechanical Rate: 20
O2 SAT: 97 %
O2 Saturation: 88.1 %
O2 Saturation: 97 %
PATIENT TEMPERATURE: 33.1
PCO2 ART: 39 mmHg (ref 32.0–48.0)
PCO2 ART: 35 mmHg (ref 32.0–48.0)
PEEP: 7 cmH2O
PEEP: 7 cmH2O
PEEP: 7 cmH2O
PH ART: 7.32 — AB (ref 7.350–7.450)
Patient temperature: 35
Patient temperature: 37
RATE: 20 resp/min
RATE: 20 resp/min
RATE: 20 resp/min
VT: 450 mL
VT: 450 mL
pCO2 arterial: 36 mmHg (ref 32.0–48.0)
pH, Arterial: 7.29 — ABNORMAL LOW (ref 7.350–7.450)
pH, Arterial: 7.33 — ABNORMAL LOW (ref 7.350–7.450)
pO2, Arterial: 127 mmHg — ABNORMAL HIGH (ref 83.0–108.0)
pO2, Arterial: 59 mmHg — ABNORMAL LOW (ref 83.0–108.0)
pO2, Arterial: 92 mmHg (ref 83.0–108.0)

## 2017-02-26 LAB — URINALYSIS, COMPLETE (UACMP) WITH MICROSCOPIC
Bilirubin Urine: NEGATIVE
Glucose, UA: 150 mg/dL — AB
KETONES UR: 5 mg/dL — AB
LEUKOCYTES UA: NEGATIVE
Nitrite: NEGATIVE
Protein, ur: 100 mg/dL — AB
Specific Gravity, Urine: 1.038 — ABNORMAL HIGH (ref 1.005–1.030)
pH: 6 (ref 5.0–8.0)

## 2017-02-26 LAB — CBC
HCT: 35.5 % (ref 35.0–47.0)
HCT: 36.8 % (ref 35.0–47.0)
HEMOGLOBIN: 12.7 g/dL (ref 12.0–16.0)
Hemoglobin: 12.1 g/dL (ref 12.0–16.0)
MCH: 30.6 pg (ref 26.0–34.0)
MCH: 30.9 pg (ref 26.0–34.0)
MCHC: 34.1 g/dL (ref 32.0–36.0)
MCHC: 34.6 g/dL (ref 32.0–36.0)
MCV: 89.9 fL (ref 80.0–100.0)
MCV: 89.4 fL (ref 80.0–100.0)
PLATELETS: 448 10*3/uL — AB (ref 150–440)
Platelets: 460 10*3/uL — ABNORMAL HIGH (ref 150–440)
RBC: 3.95 MIL/uL (ref 3.80–5.20)
RBC: 4.11 MIL/uL (ref 3.80–5.20)
RDW: 12.4 % (ref 11.5–14.5)
RDW: 12.4 % (ref 11.5–14.5)
WBC: 29.4 10*3/uL — ABNORMAL HIGH (ref 3.6–11.0)
WBC: 26.1 10*3/uL — ABNORMAL HIGH (ref 3.6–11.0)

## 2017-02-26 LAB — URINE DRUG SCREEN, QUALITATIVE (ARMC ONLY)
AMPHETAMINES, UR SCREEN: NOT DETECTED
BENZODIAZEPINE, UR SCRN: NOT DETECTED
Barbiturates, Ur Screen: NOT DETECTED
COCAINE METABOLITE, UR ~~LOC~~: NOT DETECTED
Cannabinoid 50 Ng, Ur ~~LOC~~: NOT DETECTED
MDMA (ECSTASY) UR SCREEN: NOT DETECTED
METHADONE SCREEN, URINE: NOT DETECTED
OPIATE, UR SCREEN: NOT DETECTED
PHENCYCLIDINE (PCP) UR S: NOT DETECTED
Tricyclic, Ur Screen: NOT DETECTED

## 2017-02-26 LAB — MRSA PCR SCREENING: MRSA BY PCR: NEGATIVE

## 2017-02-26 LAB — ECHOCARDIOGRAM COMPLETE
HEIGHTINCHES: 62 in
WEIGHTICAEL: 3128.77 [oz_av]

## 2017-02-26 LAB — MAGNESIUM
MAGNESIUM: 1.8 mg/dL (ref 1.7–2.4)
MAGNESIUM: 2.8 mg/dL — AB (ref 1.7–2.4)
Magnesium: 2.2 mg/dL (ref 1.7–2.4)

## 2017-02-26 LAB — GLUCOSE, CAPILLARY
GLUCOSE-CAPILLARY: 312 mg/dL — AB (ref 65–99)
GLUCOSE-CAPILLARY: 192 mg/dL — AB (ref 65–99)
GLUCOSE-CAPILLARY: 289 mg/dL — AB (ref 65–99)
GLUCOSE-CAPILLARY: 311 mg/dL — AB (ref 65–99)
GLUCOSE-CAPILLARY: 217 mg/dL — AB (ref 65–99)
GLUCOSE-CAPILLARY: 336 mg/dL — AB (ref 65–99)
GLUCOSE-CAPILLARY: 319 mg/dL — AB (ref 65–99)
GLUCOSE-CAPILLARY: 274 mg/dL — AB (ref 65–99)
GLUCOSE-CAPILLARY: 309 mg/dL — AB (ref 65–99)
GLUCOSE-CAPILLARY: 314 mg/dL — AB (ref 65–99)
Glucose-Capillary: 311 mg/dL — ABNORMAL HIGH (ref 65–99)
Glucose-Capillary: 330 mg/dL — ABNORMAL HIGH (ref 65–99)
Glucose-Capillary: 297 mg/dL — ABNORMAL HIGH (ref 65–99)
Glucose-Capillary: 297 mg/dL — ABNORMAL HIGH (ref 65–99)
Glucose-Capillary: 296 mg/dL — ABNORMAL HIGH (ref 65–99)
Glucose-Capillary: 295 mg/dL — ABNORMAL HIGH (ref 65–99)
Glucose-Capillary: 324 mg/dL — ABNORMAL HIGH (ref 65–99)

## 2017-02-26 LAB — APTT
APTT: 30 s (ref 24–36)
aPTT: 33 seconds (ref 24–36)
aPTT: 34 seconds (ref 24–36)

## 2017-02-26 LAB — ALT: ALT: 416 U/L — AB (ref 14–54)

## 2017-02-26 LAB — TRIGLYCERIDES: TRIGLYCERIDES: 161 mg/dL — AB (ref <150)

## 2017-02-26 LAB — AST: AST: 573 U/L — ABNORMAL HIGH (ref 15–41)

## 2017-02-26 LAB — LACTIC ACID, PLASMA: Lactic Acid, Venous: 5.2 mmol/L (ref 0.5–1.9)

## 2017-02-26 LAB — PHOSPHORUS: Phosphorus: 3.6 mg/dL (ref 2.5–4.6)

## 2017-02-26 SURGERY — LEFT HEART CATH AND CORONARY ANGIOGRAPHY
Anesthesia: Moderate Sedation

## 2017-02-26 MED ORDER — SODIUM BICARBONATE 8.4 % IV SOLN: 100.0000 meq | Freq: Once | INTRAVENOUS | Status: DC

## 2017-02-26 MED ORDER — SODIUM CHLORIDE 0.9% FLUSH
10.0000 mL | INTRAVENOUS | Status: DC | PRN
  Administered 2017-03-13: 20 mL
  Filled 2017-02-26: qty 40

## 2017-02-26 MED ORDER — DEXTROSE 5 % IV SOLN
0.0000 ug/min | INTRAVENOUS | Status: DC
  Administered 2017-02-26: 20 ug/min via INTRAVENOUS
  Filled 2017-02-26: qty 4

## 2017-02-26 MED ORDER — HEPARIN SODIUM (PORCINE) 5000 UNIT/ML IJ SOLN: 5000.0000 [IU] | Freq: Three times a day (TID) | INTRAMUSCULAR | Status: DC

## 2017-02-26 MED ORDER — POTASSIUM CHLORIDE 10 MEQ/100ML IV SOLN: 10.0000 meq | INTRAVENOUS | Status: DC

## 2017-02-26 MED ORDER — POTASSIUM CHLORIDE 20 MEQ/15ML (10%) PO SOLN
60.0000 meq | Freq: Once | ORAL | Status: AC
  Administered 2017-02-26: 60 meq
  Filled 2017-02-26: qty 45

## 2017-02-26 MED ORDER — FENTANYL CITRATE (PF) 100 MCG/2ML IJ SOLN: 100.0000 ug | Freq: Once | INTRAMUSCULAR | Status: DC

## 2017-02-26 MED ORDER — EPINEPHRINE PF 1 MG/ML IJ SOLN
0.5000 ug/min | INTRAVENOUS | Status: DC
  Administered 2017-02-26 (×2): 20 ug/min via INTRAVENOUS
  Administered 2017-02-26: 0.5 ug/min via INTRAVENOUS
  Administered 2017-02-27 (×2): 20 ug/min via INTRAVENOUS
  Administered 2017-02-27: 8 ug/min via INTRAVENOUS
  Filled 2017-02-26 (×9): qty 4

## 2017-02-26 MED ORDER — ARTIFICIAL TEARS OPHTHALMIC OINT
1.0000 "application " | TOPICAL_OINTMENT | Freq: Three times a day (TID) | OPHTHALMIC | Status: DC
  Administered 2017-02-26 – 2017-02-27 (×5): 1 via OPHTHALMIC
  Filled 2017-02-26: qty 3.5

## 2017-02-26 MED ORDER — MIDAZOLAM HCL 2 MG/2ML IJ SOLN: 2.0000 mg | INTRAMUSCULAR | Status: DC | PRN

## 2017-02-26 MED ORDER — SODIUM CHLORIDE 0.9% FLUSH
3.0000 mL | INTRAVENOUS | Status: DC | PRN
  Administered 2017-02-26: 3 mL via INTRAVENOUS
  Filled 2017-02-26: qty 3

## 2017-02-26 MED ORDER — NOREPINEPHRINE BITARTRATE 1 MG/ML IV SOLN
0.0000 ug/min | INTRAVENOUS | Status: DC
  Administered 2017-02-26: 30 ug/min via INTRAVENOUS
  Administered 2017-02-27: 5 ug/min via INTRAVENOUS
  Administered 2017-02-28: 16 ug/min via INTRAVENOUS
  Administered 2017-03-01: 5 ug/min via INTRAVENOUS
  Administered 2017-03-01: 9 ug/min via INTRAVENOUS
  Administered 2017-03-02: 2 ug/min via INTRAVENOUS
  Filled 2017-02-26 (×8): qty 16

## 2017-02-26 MED ORDER — FUROSEMIDE 10 MG/ML IJ SOLN
20.0000 mg | Freq: Once | INTRAMUSCULAR | Status: AC
  Administered 2017-02-26: 20 mg via INTRAVENOUS
  Filled 2017-02-26: qty 2

## 2017-02-26 MED ORDER — HEPARIN SODIUM (PORCINE) 5000 UNIT/ML IJ SOLN
5000.0000 [IU] | Freq: Three times a day (TID) | INTRAMUSCULAR | Status: DC
  Administered 2017-02-26 – 2017-03-11 (×38): 5000 [IU] via SUBCUTANEOUS
  Filled 2017-02-26 (×38): qty 1

## 2017-02-26 MED ORDER — IOPAMIDOL (ISOVUE-300) INJECTION 61%
INTRAVENOUS | Status: DC | PRN
  Administered 2017-02-26: 60 mL via INTRA_ARTERIAL

## 2017-02-26 MED ORDER — SODIUM BICARBONATE 8.4 % IV SOLN
50.0000 meq | Freq: Once | INTRAVENOUS | Status: AC
  Administered 2017-02-26: 50 meq via INTRAVENOUS
  Filled 2017-02-26: qty 50

## 2017-02-26 MED ORDER — SODIUM CHLORIDE 0.9 % IV BOLUS (SEPSIS): 500.0000 mL | Freq: Once | INTRAVENOUS | Status: DC

## 2017-02-26 MED ORDER — SODIUM CHLORIDE 0.9% FLUSH
3.0000 mL | Freq: Two times a day (BID) | INTRAVENOUS | Status: DC
  Administered 2017-02-26 (×2): 3 mL via INTRAVENOUS

## 2017-02-26 MED ORDER — LIDOCAINE HCL (PF) 1 % IJ SOLN
INTRAMUSCULAR | Status: AC
  Filled 2017-02-26: qty 30

## 2017-02-26 MED ORDER — PROPOFOL 1000 MG/100ML IV EMUL
5.0000 ug/kg/min | INTRAVENOUS | Status: DC
  Administered 2017-02-26: 10 ug/kg/min via INTRAVENOUS
  Filled 2017-02-26: qty 100

## 2017-02-26 MED ORDER — SODIUM CHLORIDE 0.9% FLUSH
10.0000 mL | Freq: Two times a day (BID) | INTRAVENOUS | Status: DC
  Administered 2017-02-27: 10 mL
  Administered 2017-02-27: 40 mL
  Administered 2017-02-28 – 2017-03-11 (×19): 10 mL
  Administered 2017-03-11: 30 mL
  Administered 2017-03-12 – 2017-03-13 (×3): 10 mL
  Administered 2017-03-13: 20 mL
  Administered 2017-03-14: 10 mL

## 2017-02-26 MED ORDER — AMIODARONE HCL IN DEXTROSE 360-4.14 MG/200ML-% IV SOLN
30.0000 mg/h | INTRAVENOUS | Status: DC
  Administered 2017-02-26 – 2017-02-27 (×3): 30 mg/h via INTRAVENOUS
  Filled 2017-02-26 (×3): qty 200

## 2017-02-26 MED ORDER — PROPOFOL 1000 MG/100ML IV EMUL
INTRAVENOUS | Status: AC
  Filled 2017-02-26: qty 100

## 2017-02-26 MED ORDER — NOREPINEPHRINE BITARTRATE 1 MG/ML IV SOLN: 0.0000 ug/min | INTRAVENOUS | Status: DC

## 2017-02-26 MED ORDER — HEPARIN (PORCINE) IN NACL 2-0.9 UNIT/ML-% IJ SOLN
INTRAMUSCULAR | Status: AC | PRN
  Administered 2017-02-26: 1000 mL via INTRA_ARTERIAL

## 2017-02-26 MED ORDER — PROPOFOL 1000 MG/100ML IV EMUL
0.0000 ug/kg/min | INTRAVENOUS | Status: DC
  Administered 2017-02-26 (×2): 10 ug/kg/min via INTRAVENOUS
  Filled 2017-02-26: qty 100

## 2017-02-26 MED ORDER — POTASSIUM CHLORIDE 10 MEQ/100ML IV SOLN
10.0000 meq | INTRAVENOUS | Status: AC
  Administered 2017-02-26 (×4): 10 meq via INTRAVENOUS
  Filled 2017-02-26 (×4): qty 100

## 2017-02-26 MED ORDER — CISATRACURIUM BOLUS VIA INFUSION
0.0500 mg/kg | INTRAVENOUS | Status: DC | PRN
  Filled 2017-02-26: qty 5

## 2017-02-26 MED ORDER — FAMOTIDINE IN NACL 20-0.9 MG/50ML-% IV SOLN
20.0000 mg | Freq: Two times a day (BID) | INTRAVENOUS | Status: DC
  Administered 2017-02-26 (×3): 20 mg via INTRAVENOUS
  Filled 2017-02-26 (×3): qty 50

## 2017-02-26 MED ORDER — POTASSIUM CHLORIDE 20 MEQ/15ML (10%) PO SOLN
40.0000 meq | Freq: Once | ORAL | Status: AC
Start: 2017-02-26 — End: 2017-02-26
  Administered 2017-02-26: 40 meq
  Filled 2017-02-26: qty 30

## 2017-02-26 MED ORDER — NOREPINEPHRINE BITARTRATE 1 MG/ML IV SOLN
INTRAVENOUS | Status: AC
  Filled 2017-02-26: qty 4

## 2017-02-26 MED ORDER — SODIUM CHLORIDE 0.9% FLUSH
3.0000 mL | Freq: Two times a day (BID) | INTRAVENOUS | Status: DC
  Administered 2017-02-26: 3 mL via INTRAVENOUS

## 2017-02-26 MED ORDER — ASPIRIN 300 MG RE SUPP: 300.0000 mg | RECTAL | Status: DC

## 2017-02-26 MED ORDER — MIDAZOLAM HCL 2 MG/2ML IJ SOLN: 2.0000 mg | Freq: Once | INTRAMUSCULAR | Status: DC

## 2017-02-26 MED ORDER — ORAL CARE MOUTH RINSE
15.0000 mL | OROMUCOSAL | Status: DC
  Administered 2017-02-26 – 2017-03-11 (×117): 15 mL via OROMUCOSAL

## 2017-02-26 MED ORDER — FENTANYL 2500MCG IN NS 250ML (10MCG/ML) PREMIX INFUSION
50.0000 ug/h | INTRAVENOUS | Status: AC
  Administered 2017-02-26: 100 ug/h via INTRAVENOUS
  Administered 2017-02-27: 75 ug/h via INTRAVENOUS
  Filled 2017-02-26 (×2): qty 250

## 2017-02-26 MED ORDER — NITROGLYCERIN 5 MG/ML IV SOLN
INTRAVENOUS | Status: AC
  Filled 2017-02-26: qty 10

## 2017-02-26 MED ORDER — VERAPAMIL HCL 2.5 MG/ML IV SOLN
INTRAVENOUS | Status: AC
  Filled 2017-02-26: qty 2

## 2017-02-26 MED ORDER — SODIUM CHLORIDE 0.9 % IV SOLN
INTRAVENOUS | Status: DC
  Administered 2017-02-26: 23.5 [IU]/h via INTRAVENOUS
  Administered 2017-02-26: 17.6 [IU]/h via INTRAVENOUS
  Administered 2017-02-26: 2.5 [IU]/h via INTRAVENOUS
  Administered 2017-02-27: 37.4 [IU]/h via INTRAVENOUS
  Administered 2017-02-27: 3.5 [IU]/h via INTRAVENOUS
  Administered 2017-02-27: 37.9 [IU]/h via INTRAVENOUS
  Filled 2017-02-26 (×7): qty 1

## 2017-02-26 MED ORDER — SODIUM CHLORIDE 0.9 % IV SOLN: 250.0000 mL | INTRAVENOUS | Status: DC | PRN

## 2017-02-26 MED ORDER — MAGNESIUM SULFATE 2 GM/50ML IV SOLN
2.0000 g | Freq: Once | INTRAVENOUS | Status: AC
Start: 2017-02-26 — End: 2017-02-26
  Administered 2017-02-26: 2 g via INTRAVENOUS
  Filled 2017-02-26: qty 50

## 2017-02-26 MED ORDER — CHLORHEXIDINE GLUCONATE 0.12% ORAL RINSE (MEDLINE KIT)
15.0000 mL | Freq: Two times a day (BID) | OROMUCOSAL | Status: DC
  Administered 2017-02-26 – 2017-03-11 (×26): 15 mL via OROMUCOSAL

## 2017-02-26 MED ORDER — SODIUM CHLORIDE 0.9 % IV SOLN
2.0000 mg/h | INTRAVENOUS | Status: DC
  Administered 2017-02-26: 2 mg/h via INTRAVENOUS
  Filled 2017-02-26: qty 10

## 2017-02-26 MED ORDER — ATROPINE SULFATE 1 MG/10ML IJ SOSY
1.0000 mg | PREFILLED_SYRINGE | INTRAMUSCULAR | Status: DC | PRN
  Administered 2017-02-26: 1 mg via INTRAVENOUS
  Filled 2017-02-26 (×3): qty 10

## 2017-02-26 MED ORDER — SODIUM CHLORIDE 0.9 % IV SOLN
0.0000 ug/min | INTRAVENOUS | Status: DC
  Administered 2017-02-26: 20 ug/min via INTRAVENOUS
  Filled 2017-02-26 (×3): qty 4

## 2017-02-26 MED ORDER — PANTOPRAZOLE SODIUM 40 MG IV SOLR
40.0000 mg | INTRAVENOUS | Status: DC
Start: 2017-02-26 — End: 2017-02-27
  Administered 2017-02-26 – 2017-02-27 (×2): 40 mg via INTRAVENOUS
  Filled 2017-02-26 (×2): qty 40

## 2017-02-26 MED ORDER — FENTANYL BOLUS VIA INFUSION
50.0000 ug | INTRAVENOUS | Status: DC | PRN
  Filled 2017-02-26: qty 50

## 2017-02-26 MED ORDER — SODIUM CHLORIDE 0.9 % IV SOLN
1.0000 ug/kg/min | INTRAVENOUS | Status: DC
  Administered 2017-02-26: 1 ug/kg/min via INTRAVENOUS
  Filled 2017-02-26 (×2): qty 20

## 2017-02-26 MED ORDER — SODIUM CHLORIDE 0.9 % IV SOLN
INTRAVENOUS | Status: DC
  Administered 2017-02-26: 03:00:00 via INTRAVENOUS

## 2017-02-26 MED ORDER — MIDAZOLAM BOLUS VIA INFUSION
2.0000 mg | INTRAVENOUS | Status: DC | PRN
  Filled 2017-02-26: qty 2

## 2017-02-26 MED ORDER — POTASSIUM CHLORIDE 10 MEQ/50ML IV SOLN
10.0000 meq | INTRAVENOUS | Status: AC
  Administered 2017-02-26 (×4): 10 meq via INTRAVENOUS
  Filled 2017-02-26 (×4): qty 50

## 2017-02-26 MED ORDER — AMIODARONE HCL IN DEXTROSE 360-4.14 MG/200ML-% IV SOLN
60.0000 mg/h | INTRAVENOUS | Status: DC
  Administered 2017-02-26: 60 mg/h via INTRAVENOUS
  Filled 2017-02-26: qty 200

## 2017-02-26 MED ORDER — POTASSIUM CHLORIDE 10 MEQ/100ML IV SOLN
10.0000 meq | INTRAVENOUS | Status: AC
  Administered 2017-02-26 – 2017-02-27 (×4): 10 meq via INTRAVENOUS
  Filled 2017-02-26 (×4): qty 100

## 2017-02-26 MED ORDER — CISATRACURIUM BOLUS VIA INFUSION
0.1000 mg/kg | Freq: Once | INTRAVENOUS | Status: AC
  Administered 2017-02-26: 8.9 mg via INTRAVENOUS
  Filled 2017-02-26: qty 9

## 2017-02-26 SURGICAL SUPPLY — 11 items
CATH INFINITI 5FR JL4 (CATHETERS) ×3 IMPLANT
CATH INFINITI JR4 5F (CATHETERS) ×3 IMPLANT
DEVICE CLOSURE MYNXGRIP 6/7F (Vascular Products) ×3 IMPLANT
DEVICE INFLAT 30 PLUS (MISCELLANEOUS) IMPLANT
GLIDESHEATH SLEND SS 6F .021 (SHEATH) ×3 IMPLANT
KIT MANI 3VAL PERCEP (MISCELLANEOUS) ×3 IMPLANT
NEEDLE PERC 18GX7CM (NEEDLE) ×3 IMPLANT
PACK CARDIAC CATH (CUSTOM PROCEDURE TRAY) ×3 IMPLANT
SHEATH PINNACLE 6F 10CM (SHEATH) ×3 IMPLANT
WIRE EMERALD 3MM-J .035X150CM (WIRE) ×3 IMPLANT
WIRE ROSEN-J .035X260CM (WIRE) IMPLANT

## 2017-02-26 NOTE — Procedures (Signed)
Central Venous Catheter Insertion Procedure Note -Left Internal Jugular Miranda [name] [id number] [birth year]  Procedure: Insertion of Central Venous Catheter Indications: Assessment of intravascular volume, Drug and/or fluid administration and Frequent blood sampling  Procedure Details Consent: Risks of procedure as well as the alternatives and risks of each were explained to the (patient/caregiver).  Consent for procedure obtained. Time Out: Verified patient identification, verified procedure, site/side was marked, verified correct patient position, special equipment/implants available, medications/allergies/relevent history reviewed, required imaging and test results available.  Performed  Maximum sterile technique was used including antiseptics, cap, gloves, gown, hand hygiene, mask and sheet. Skin prep: Chlorhexidine; local anesthetic administered A antimicrobial bonded/coated triple lumen catheter was placed in the left internal jugular vein using the Seldinger technique.  Evaluation Blood flow good Complications: No apparent complications Patient did tolerate procedure well. Chest X-ray ordered to verify placement.  CXR: pending.  Procedure performed under direct ultrasound guidance for real time vessel cannulation.      Hana Trippett,AG-ACNP Pulmonary & Critical Care

## 2017-02-26 NOTE — Progress Notes (Signed)
*  PRELIMINARY RESULTS* Echocardiogram 2D Echocardiogram has been performed.  Miranda [name] 02/26/2017, 8:13 AM

## 2017-02-26 NOTE — ED Notes (Signed)
Per Roxan Hockey repeat EKG, Mayra EDT performed and given to MD.

## 2017-02-26 NOTE — Progress Notes (Signed)
Pharmacy Consult for Electrolyte Monitoring and Replacement Indication: Hypokalemia (Targeted Temperature Management)  Allergies  Allergen Reactions  . Ace Inhibitors   . Compazine [Prochlorperazine Edisylate]   . Lisinopril   . Penicillins     Has patient had a PCN reaction causing immediate rash, facial/tongue/throat swelling, SOB or lightheadedness with hypotension: Unknown Has patient had a PCN reaction causing severe rash involving mucus membranes or skin necrosis: Unknown Has patient had a PCN reaction that required hospitalization: Unknown Has patient had a PCN reaction occurring within the last 10 years: Unknown If all of the above answers are "NO", then may proceed with Cephalosporin use.     Patient Measurements: Height: 5\' 2"  (157.5 cm) Weight: 195 lb 8.8 oz (88.7 kg) IBW/kg (Calculated) : 50.1  Vital Signs: Temp: 92.1 F (33.4 C) (08/22 1500) Temp Source: Core (Comment) (08/22 1200) BP: 100/67 (08/22 1500) Pulse Rate: 71 (08/22 1400) Intake/Output from previous day: 08/21 0701 - 08/22 0700 In: 1281.6 [I.V.:1031.6; IV Piggyback:250] Out: 320 [Urine:320] Intake/Output from this shift: Total I/O In: 1876 [I.V.:1526; IV Piggyback:350] Out: 325 [Urine:325]  Labs:  Recent Labs  02/25/17 2240 02/26/17 0214 02/26/17 0340 02/26/17 0612 02/26/17 0820 02/26/17 1235 02/26/17 1237  WBC 19.2* 29.4* 26.1*  --   --   --   --   HGB 13.4 12.1 12.7  --   --   --   --   HCT 39.1 35.5 36.8  --   --   --   --   PLT 511* 448* 460*  --   --   --   --   APTT  --   --  30 33  --   --  34  CREATININE 1.14* 0.92 1.08* 1.05* 1.26* 1.05*  --   MG  --   --  1.8  --   --   --   --   PHOS  --   --  3.6  --   --   --   --    Estimated Creatinine Clearance: 60.4 mL/min (A) (by C-G formula based on SCr of 1.05 mg/dL (H)).  Potassium (mmol/L)  Date Value  02/26/2017 3.6   Sodium (mmol/L)  Date Value  02/26/2017 139  08/16/2016 141   Calcium (mg/dL)  Date Value  83/72/9021  7.9 (L)     Assessment: 56 y/o F on hypothermia protocol s/p cardiac arrest. Cardiology would like a goal K of > or = 4 mg/dL but will set an arbitrary stop on this goal for 3 am due to rewarming to start around 7 am.   Plan:  K replaced this AM with 4 runs of KCl IV and 60 meq KCl via tube. Will replace KCl with 40 meq VT x 1 and f/u repeat BMET at 2000.  Valentina Gu 02/26/2017,4:06 PM

## 2017-02-26 NOTE — Consult Note (Signed)
PULMONARY / CRITICAL CARE MEDICINE   Name: Miranda [name] MRN: [id number] DOB: [birth year]    ADMISSION DATE:  02/25/2017   PT PROFILE: 77 F suffered OOH arrest with at least 20 mins ACLS/CPR. Initial identified rhythm was VF. Underwent defib X 3 before ROSC. LHC revealed no CAD, severely reduced LV function and moderately elevated LVEDP. TTM 33 degree protocol initiated.  MAJOR EVENTS/TEST RESULTS: 08/21 CT head: no acute findings 08/21 CTA chest: no PE. Diffuse bilateral opacities 08/22 LHC: no CAD, severely reduced LV function and moderately elevated LVEDP 08/22 Echocardiogram:  08/22 Refractory shock despite norepinephrine and phenylephrine infusions. Epinephrine infusion initiated  INDWELLING DEVICES:: ETT 08/21 >>  L IJ CVL 08/22 >>  R femoral A-line 08/22 >>   MICRO DATA: MRSA PCR 08/22 >> NEG Blood 08/22 >>   ANTIMICROBIALS:      HISTORY OF PRESENT ILLNESS: Miranda [name] is a 56 year old female with known history of hypertension.  Patient reportedly had diarrhea over the last 2-3 days for which she was taking upto 12 imodium per day.  She was keeping herself well hydrated . Apparently she had stopped taking her antihypertensives due to her GI disturbance. Patient was found by her husband seated on the toilet. When she attempted to stand up she passed out.  Her husband held her and lowered her to the to the ground.  Patient remained unresponsive with agonal breathing.  Patient did not have a pulse therefore her husband started CPR.  Paramedics arrived with in 5-10 minutes  And administered a single shock with AED.  She was noted to be in V-FIB and received 2 additional shocks with ROSC.  A king airway was placed and patient was brought to [facility] where she was intubated.  Patient was emergently taken to the Cath lab and did left heart cath which was concerning for severely reduced LVEF( 15%) and moderately elevated LVEDP.  CT chest was negative for PE.  Post catheterization patient was  transferred to the ICU.  Hypothermia protocol(33 C) was initiated.    PAST MEDICAL HISTORY :  She  has a past medical history of Dependent edema; Hot flashes; HTN (hypertension); Migraines; and Nausea.  PAST SURGICAL HISTORY: She  has a past surgical history that includes Cholecystectomy (1984); Appendectomy (1974); and Tonsillectomy (1964).  Allergies  Allergen Reactions  . Ace Inhibitors   . Compazine [Prochlorperazine Edisylate]   . Lisinopril   . Penicillins     No current facility-administered medications on file prior to encounter.    Current Outpatient Prescriptions on File Prior to Encounter  Medication Sig  . acetaminophen (TYLENOL) 500 MG tablet Take 1,000 mg by mouth every 6 (six) hours as needed.  Marland Kitchen amLODipine (NORVASC) 10 MG tablet Take 10 mg by mouth daily.  . hydrochlorothiazide (HYDRODIURIL) 25 MG tablet Take 1 tablet (25 mg total) by mouth daily.  Marland Kitchen ibuprofen (ADVIL,MOTRIN) 200 MG tablet Take 600 mg by mouth every 6 (six) hours as needed.  Marland Kitchen olmesartan (BENICAR) 40 MG tablet Take 1 tablet (40 mg total) by mouth daily.  . Olmesartan-Amlodipine-HCTZ 40-10-25 MG TABS Take 1 tablet by mouth daily.  . potassium chloride (K-DUR) 10 MEQ tablet Take 1 tablet (10 mEq total) by mouth daily as needed.    FAMILY HISTORY:  Her indicated that her mother is deceased. She indicated that her father is deceased. She indicated that her sister is alive. She indicated that both of her brothers are alive.    SOCIAL HISTORY: She  reports  that she has never smoked. She has never used smokeless tobacco. She reports that she does not drink alcohol or use drugs.  REVIEW OF SYSTEMS:   Unable to obtain  SUBJECTIVE:  Unable to Obtain  VITAL SIGNS: BP (!) 102/58   Pulse (!) 109   Temp 98.5 F (36.9 C)   Resp (!) 22   Wt 88.7 kg (195 lb 8.8 oz)   SpO2 (!) 89%   BMI 38.19 kg/m   HEMODYNAMICS:    VENTILATOR SETTINGS: Vent Mode: AC FiO2 (%):  [100 %] 100 % Set Rate:  [20  bmp] 20 bmp Vt Set:  [450 mL] 450 mL PEEP:  [7 cmH20] 7 cmH20  INTAKE / OUTPUT: No intake/output data recorded.  PHYSICAL EXAMINATION: General:  Obese, Caucasian female, intubated and on mechanical ventilation Neuro:  unresponsive HEENT:  AT,[place], Pupis 3 non reactive Cardiovascular:  S1s2,regular,no m/r/g Lungs:coarse breath sounds, diminished bibasilar Abdomen: obese,round, hypoactive Bowel sounds Musculoskeletal:  No edema, cyanosis Skin: warm,dry and intact  LABS:  BMET  Recent Labs Lab 02/25/17 2240  NA 141  K 2.9*  CL 101  CO2 22  BUN 19  CREATININE 1.14*  GLUCOSE 284*    Electrolytes  Recent Labs Lab 02/25/17 2240  CALCIUM 9.4    CBC  Recent Labs Lab 02/25/17 2240  WBC 19.2*  HGB 13.4  HCT 39.1  PLT 511*    Coag's  Recent Labs Lab 02/25/17 2240  INR 1.07    Sepsis Markers  Recent Labs Lab 02/25/17 2240  LATICACIDVEN 7.2*    ABG  Recent Labs Lab 02/26/17 0026  PHART 7.33*  PCO2ART 36  PO2ART 59*    Liver Enzymes No results for input(s): AST, ALT, ALKPHOS, BILITOT, ALBUMIN in the last 168 hours.  Cardiac Enzymes  Recent Labs Lab 02/25/17 2240  TROPONINI 0.03*    Glucose No results for input(s): GLUCAP in the last 168 hours.  CXR: edema vs ARDS pattern    ASSESSMENT / PLAN:  PULMONARY A: Acute hypoxemic respiratory failure after cardiac arrest Severe pulmonary edema P:   Cont full vent support - settings reviewed and/or adjusted Cont vent bundle Daily SBT if/when meets criteria  CARDIOVASCULAR A:  Cardiac arrest - initial rhythm VF Hx of Hypertension P:  Cardiology following MAP goal > 75 mmHg Initiate epinephrine infusion 08/22 Wean off phenylephrine first, then NE Continue amiodarone infusion   RENAL A:   AKI Hypokalemia P:   Monitor BMET intermittently Monitor I/Os Correct electrolytes as indicated Try to keep K+ > 4.0  GASTROINTESTINAL A:   Recent diarrheal illness P:   SUP: IV  famotidine No TFs until rewarmed  HEMATOLOGIC A:   No active Issues P:  DVT px: SQ heparin Monitor CBC intermittently Transfuse per usual guidelines  INFECTIOUS A:   Leukocytosis - likely stress response P:   Monitor temp, WBC count Micro and abx as above   ENDOCRINE A:   Stress induced hyperglycemia  P:   Cont insulin gtt per protocol  NEUROLOGIC A:   Anoxic encephalopathy P:   RASS goal: N/A while on NMBs Initiated TTM protocol (33 c) Sedatives and paralytics per TTM protocol   FAMILY: husband updated @ bedside   [name] Varughese,AG-ACNP Pulmonary and Critical Care Medicine [facility]   02/26/2017, 2:08 AM  PCCM ATTENDING ATTESTATION: I have evaluated patient with the APP Varughese, reviewed database in its entirety and discussed care plan in detail. In addition, this patient was discussed on multidisciplinary rounds. The above note  has been modified extensively by me   CCM time 60 mins [name], MD PCCM service Mobile [phone] Pager [phone] 02/26/2017 12:08 PM

## 2017-02-26 NOTE — ED Notes (Signed)
On phone with pharmacy regarding incorrect orders of medications. Pharmacy informed RN of medications to be sent and others that are currently in ED med cart.

## 2017-02-26 NOTE — Progress Notes (Signed)
CH made initial visit to Pt in IC-06. Pt is intubated and unable to respond at this time. Husband is bedside. Per RN, husband is having a terrible time. He is grieving and not eating. He stated to me a couple of times "If she goes, I go." CH spent some time with husband exploring his grief. Husband told many stories of his life and times with his wife. The daughter arrived some time during the visit. Husband asked for prayer, which was provided. CH will follow up with family on my next rounding.    02/26/17 1600  Clinical Encounter Type  Visited With Patient;Patient and family together;Health care provider  Visit Type Initial;Spiritual support;Critical Care  Referral From Nurse  Consult/Referral To Chaplain  Spiritual Encounters  Spiritual Needs Prayer;Emotional;Grief support

## 2017-02-26 NOTE — H&P (Signed)
[facility] - [place] at [facility]   PATIENT NAME: Miranda [name]    MR#:  [id number]  DATE OF BIRTH:  18-Jul-1960  DATE OF ADMISSION:  02/25/2017  PRIMARY CARE PHYSICIAN: Soles, Willaim Rayas, MD   REQUESTING/REFERRING PHYSICIAN:   CHIEF COMPLAINT:   Chief Complaint  Patient presents with  . Cardiac Arrest    HISTORY OF PRESENT ILLNESS: Miranda [name]  is a 56 y.o. female with a known history of Hypertension, migraines presented to the emergency room for cardiac arrest. Patient works at [employer] at [employer]. Patient currently on ventilator and unable to give any history. History of pain from patient's husband was at bedside. Patient had diarrhea for the last couple of days and was taking almost 12 Imodium tablets every day. Yesterday around 9:30 PM she was video chatting with her daughter and around 9:45 PM she was sitting on the toilet, old and became unresponsive. Patient's husband did CPR and EMS arrived and patient was found to be in V. Fib. Patient was shocked twice by the EMS. Patient was intubated and put on ventilator and brought to the emergency room. Workup was done in the emergency room which showed low potassium level. Patient was seen by cardiology immediately in the emergency room and was taken to cardiac catheter lab and underwent cardiac catheterization. No blockage was found but the EF was found to be 15%. Intensivist team was consulted. Central line was placed by intensivist team. Hypothermia protocol was started. Her WBC count was elevated and she was started on IV cefepime antibiotic. Hospitalist service was also consulted.  PAST MEDICAL HISTORY:   Past Medical History:  Diagnosis Date  . Dependent edema    bilateral legs  . Hot flashes   . HTN (hypertension)   . Migraines   . Nausea     PAST SURGICAL HISTORY: Past Surgical History:  Procedure Laterality Date  . APPENDECTOMY  1974  . CHOLECYSTECTOMY  1984  .  TONSILLECTOMY  1964    SOCIAL HISTORY:  Social History  Substance Use Topics  . Smoking status: Never Smoker  . Smokeless tobacco: Never Used  . Alcohol use No    FAMILY HISTORY:  Family History  Problem Relation Age of Onset  . Lymphoma Mother   . Heart disease Father   . Stroke Father   . Hyperlipidemia Father   . Hypertension Father   . Hypertension Sister   . Migraines Sister   . Hypertension Brother     DRUG ALLERGIES:  Allergies  Allergen Reactions  . Ace Inhibitors   . Compazine [Prochlorperazine Edisylate]   . Lisinopril   . Penicillins     REVIEW OF SYSTEMS:  Could not be obtained, patient on ventiilator  MEDICATIONS AT HOME:  Prior to Admission medications   Medication Sig Start Date End Date Taking? Authorizing Provider  acetaminophen (TYLENOL) 500 MG tablet Take 1,000 mg by mouth every 6 (six) hours as needed.    [provider]  amLODipine (NORVASC) 10 MG tablet Take 10 mg by mouth daily.    [provider]  hydrochlorothiazide (HYDRODIURIL) 25 MG tablet Take 1 tablet (25 mg total) by mouth daily. 08/16/16 11/14/16  Antonieta Iba, MD  ibuprofen (ADVIL,MOTRIN) 200 MG tablet Take 600 mg by mouth every 6 (six) hours as needed.    [provider]  olmesartan (BENICAR) 40 MG tablet Take 1 tablet (40 mg total) by mouth daily. 01/16/17   Antonieta Iba, MD  Olmesartan-Amlodipine-HCTZ 40-10-25 MG TABS Take 1 tablet by mouth daily. 08/20/16   Antonieta Iba, MD  potassium chloride (K-DUR) 10 MEQ tablet Take 1 tablet (10 mEq total) by mouth daily as needed. 08/20/16 11/18/16  Antonieta Iba, MD      PHYSICAL EXAMINATION:   VITAL SIGNS: Blood pressure 105/81, pulse (!) 109, temperature (!) 97.5 F (36.4 C), temperature source Core (Comment), resp. rate 20, height 5\' 2"  (1.575 m), weight 88.7 kg (195 lb 8.8 oz), SpO2 96 %.  Vent setting : Tidal volume : 450 Rate : 20 FIO2 : 100% GENERAL:  56 y.o.-year-old patient lying in the  bed on ventilator EYES: Pupils equal, round, reactive to light and accommodation at 3mm. No scleral icterus. Extraocular muscles intact.  HEENT: Head atraumatic, normocephalic. Oropharynx dry and nasopharynx clear.  NECK:  Supple, no jugular venous distention. No thyroid enlargement, no tenderness.  LUNGS: Normal breath sounds bilaterally, no wheezing, rales,rhonchi or crepitation. No use of accessory muscles of respiration.  CARDIOVASCULAR: S1, S2 normal. No murmurs, rubs, or gallops.  ABDOMEN: Soft, nontender, nondistended. Bowel sounds present. No organomegaly or mass.  EXTREMITIES: No pedal edema, cyanosis, or clubbing.  NEUROLOGIC: Cranial nerves II through XII are intact. Muscle strength 5/5 in all extremities. Sensation intact. Gait not checked.  PSYCHIATRIC: The patient is alert and oriented x 3.  SKIN: No obvious rash, lesion, or ulcer.   LABORATORY PANEL:   CBC  Recent Labs Lab 02/25/17 2240 02/26/17 0214  WBC 19.2* 29.4*  HGB 13.4 12.1  HCT 39.1 35.5  PLT 511* 448*  MCV 89.5 89.9  MCH 30.6 30.6  MCHC 34.2 34.1  RDW 12.6 12.4  LYMPHSABS 9.3*  --   MONOABS 1.8*  --   EOSABS 0.2  --   BASOSABS 0.1  --    ------------------------------------------------------------------------------------------------------------------  Chemistries   Recent Labs Lab 02/25/17 2240 02/26/17 0214  NA 141 139  K 2.9* 2.2*  CL 101 103  CO2 22 21*  GLUCOSE 284* 273*  BUN 19 19  CREATININE 1.14* 0.92  CALCIUM 9.4 8.5*   ------------------------------------------------------------------------------------------------------------------ estimated creatinine clearance is 68.9 mL/min (by C-G formula based on SCr of 0.92 mg/dL). ------------------------------------------------------------------------------------------------------------------ No results for input(s): TSH, T4TOTAL, T3FREE, THYROIDAB in the last 72 hours.  Invalid input(s): FREET3   Coagulation profile  Recent  Labs Lab 02/25/17 2240  INR 1.07   ------------------------------------------------------------------------------------------------------------------- No results for input(s): DDIMER in the last 72 hours. -------------------------------------------------------------------------------------------------------------------  Cardiac Enzymes  Recent Labs Lab 02/25/17 2240  TROPONINI 0.03*   ------------------------------------------------------------------------------------------------------------------ Invalid input(s): POCBNP  ---------------------------------------------------------------------------------------------------------------  Urinalysis    Component Value Date/Time   COLORURINE YELLOW (A) 02/26/2017 0005   APPEARANCEUR CLOUDY (A) 02/26/2017 0005   LABSPEC 1.038 (H) 02/26/2017 0005   PHURINE 6.0 02/26/2017 0005   GLUCOSEU 150 (A) 02/26/2017 0005   HGBUR SMALL (A) 02/26/2017 0005   BILIRUBINUR NEGATIVE 02/26/2017 0005   KETONESUR 5 (A) 02/26/2017 0005   PROTEINUR 100 (A) 02/26/2017 0005   NITRITE NEGATIVE 02/26/2017 0005   LEUKOCYTESUR NEGATIVE 02/26/2017 0005     RADIOLOGY: Ct Head Wo Contrast  Result Date: 02/25/2017 CLINICAL DATA:  Focal neuro deficit, greater than 6 hours. Stroke suspected. Unresponsive, post CPR. EXAM: CT HEAD WITHOUT CONTRAST TECHNIQUE: Contiguous axial images were obtained from the base of the skull through the vertex without intravenous contrast. COMPARISON:  None. FINDINGS: Brain: No evidence of acute infarction, hemorrhage, hydrocephalus, extra-axial collection or mass lesion/mass effect. Gray-white differentiation is preserved, no cerebral edema. Vascular: Atherosclerosis of  skullbase vasculature without hyperdense vessel or abnormal calcification. Skull: Normal. Negative for fracture or focal lesion. Sinuses/Orbits: Paranasal sinuses and mastoid air cells are clear. The visualized orbits are unremarkable. Other: None. IMPRESSION: No acute  intracranial abnormality. Electronically Signed   By: Rubye Oaks M.D.   On: 02/25/2017 23:18   Dg Chest Portable 1 View  Result Date: 02/25/2017 CLINICAL DATA:  Status post intubation. EXAM: PORTABLE CHEST 1 VIEW COMPARISON:  None. FINDINGS: The heart size and mediastinal contours are within normal limits. Endotracheal tube is seen projected over tracheal air shadow with distal tip 4 cm above the carina. Nasogastric tube is seen looped within proximal stomach. Bilateral lung opacities are noted concerning for edema or possibly inflammation. No definite pneumothorax or pleural effusion is noted. The visualized skeletal structures are unremarkable. IMPRESSION: Endotracheal and nasogastric tubes in grossly good position. Mild bilateral lung opacities are noted concerning for edema or possibly inflammation. Electronically Signed   By: Lupita Raider, M.D.   On: 02/25/2017 23:01   Ct Angio Chest Aorta W And/or Wo Contrast  Result Date: 02/25/2017 CLINICAL DATA:  Unwitnessed fall, unresponsive. EXAM: CT ANGIOGRAPHY CHEST WITH CONTRAST TECHNIQUE: Multidetector CT imaging of the chest was performed using the standard protocol during bolus administration of intravenous contrast. Multiplanar CT image reconstructions and MIPs were obtained to evaluate the vascular anatomy. CONTRAST:  100 mL of Isovue 370 intravenously. COMPARISON:  Radiograph of same day. FINDINGS: Cardiovascular: Suboptimal opacification of pulmonary arteries is noted due to timing of contrast bolus as well as respiratory motion artifact. Large central pulmonary embolus is not identified in the main pulmonary artery or the main portions of the right or left pulmonary emboli. However, smaller peripheral pulmonary emboli cannot be excluded on the basis of this exam. There is no evidence of thoracic aortic dissection or aneurysm. No pericardial effusion is noted. Mediastinum/Nodes: Endotracheal tube is in grossly good position. No mediastinal  adenopathy is noted. Thyroid gland is unremarkable. Lungs/Pleura: No pneumothorax or pleural effusion is noted. Bilateral diffuse lung opacities are noted concerning for edema or pneumonia. Upper Abdomen: Nasogastric tube tip is seen in distal stomach. Musculoskeletal: No chest wall abnormality. No acute or significant osseous findings. Review of the MIP images confirms the above findings. IMPRESSION: Suboptimal opacification of pulmonary arteries is noted due to timing of contrast bolus as well as respiratory motion artifact. Large central pulmonary embolus is not identified, but smaller peripheral emboli cannot be excluded on the basis of this exam. Bilateral lung opacities are noted concerning for edema or pneumonia. Endotracheal and nasogastric tubes appear to be in good position. Electronically Signed   By: Lupita Raider, M.D.   On: 02/25/2017 23:35    EKG: Orders placed or performed during the hospital encounter of 02/25/17  . EKG 12-Lead  . EKG 12-Lead  . EKG 12-Lead  . EKG 12-Lead  . EKG 12-Lead  . EKG 12-Lead    IMPRESSION AND PLAN: 56 year old female patient with history of hypertension, migraine was brought to emergency room for unresponsiveness. Admitting diagnosis 1. Cardiac arrest 2. Acute cardiorespiratory failure 3. Severe hypokalemia 4. Acute enteritis 5. Altered mental status Treatment plan It patient to ICU Hypothermia protocol IV fluid hydration Potassium supplementation Keep patient nothing by mouth IV propofol drip for sedation Continue mechanical ventilation Appreciate Cardiology consultation Appreciate Intensivist consultation Monitor electrolytes Check echocardiogram Cycle troponin IV cefepime antibiotic DVT prophylaxis subcutaneous heparin    All the records are reviewed and case discussed with ED provider. Management plans  discussed with the patient, family and they are in agreement.  CODE STATUS:FULL CODE Surrogate decision maker : husband     Code Status Orders        Start     Ordered   02/26/17 0156  Full code  Continuous     02/26/17 0205    Code Status History    Date Active Date Inactive Code Status Order ID Comments User Context   02/26/2017  1:27 AM 02/26/2017  2:05 AM Full Code [id number]  End, Cristal Deer, MD Inpatient       TOTAL CRITICAL CARE  TIME TAKING CARE OF THIS PATIENT: 55 minutes.    Ihor Austin M.D on 02/26/2017 at 3:23 AM  Between 7am to 6pm - Pager - [phone]  After 6pm go to [email or web address] - password [email or web address] [facility]  [facility]  Office  [phone]  CC: Primary care physician; Judeen Hammans, MD

## 2017-02-26 NOTE — ED Notes (Addendum)
Pt cleaned up from large BM dark in coloration and runny. Pt linens changed and repositioned in the bed. EKG leads changed and replaced with new stickers.

## 2017-02-26 NOTE — ED Notes (Addendum)
On the phone with Pharmacy trying to straighten out medications to be hung asap per Dr. Roxan Hockey. Unable to obtain blood cultures before transport to cath lab. Per Cardiologist Cath lab now ready for pt. Pt heading to cath lab at this time. Family following staff to waiting area. Bedside report to be given upon arrival.

## 2017-02-26 NOTE — ED Notes (Addendum)
Family giving consent to cardiologist at this time. Husband of pt took home pt's rings from right hand and medications. Rings from left hand unable to get off. Rings taped and cath lab RN's notified.

## 2017-02-26 NOTE — Procedures (Signed)
Arterial Catheter Insertion Procedure Note Miranda [name] [id number] 1960/12/28  Procedure: Insertion of Arterial Catheter  Indications: Blood pressure monitoring and Frequent blood sampling  Procedure Details Consent: Risks of procedure as well as the alternatives and risks of each were explained to the (patient/caregiver).  Consent for procedure obtained. Time Out: Verified patient identification, verified procedure, site/side was marked, verified correct patient position, special equipment/implants available, medications/allergies/relevent history reviewed, required imaging and test results available.  Performed  Maximum sterile technique was used including antiseptics, cap, gloves, gown, hand hygiene, mask and sheet. Skin prep: Chlorhexidine; local anesthetic administered 20 gauge catheter was inserted into right femoral artery using the Seldinger technique.  Evaluation Blood flow good; BP tracing good. Complications: No apparent complications.  Right femoral arterial line placed utilizing ultrasound no complications noted during or following procedure.  Sonda Rumble, AGNP  Pulmonary/Critical Care Pager [phone] (please enter 7 digits) PCCM Consult Pager [phone] (please enter 7 digits)    Eugenie Norrie 02/26/2017

## 2017-02-26 NOTE — Progress Notes (Addendum)
Pharmacy Consult for Electrolyte Monitoring and Replacement Indication: Hypokalemia (Targeted Temperature Management)  Allergies  Allergen Reactions  . Ace Inhibitors   . Compazine [Prochlorperazine Edisylate]   . Lisinopril   . Penicillins     Has patient had a PCN reaction causing immediate rash, facial/tongue/throat swelling, SOB or lightheadedness with hypotension: Unknown Has patient had a PCN reaction causing severe rash involving mucus membranes or skin necrosis: Unknown Has patient had a PCN reaction that required hospitalization: Unknown Has patient had a PCN reaction occurring within the last 10 years: Unknown If all of the above answers are "NO", then may proceed with Cephalosporin use.     Patient Measurements: Height: 5\' 2"  (157.5 cm) Weight: 195 lb 8.8 oz (88.7 kg) IBW/kg (Calculated) : 50.1  Vital Signs: Temp: 91.9 F (33.3 C) (08/22 2100) Temp Source: Core (Comment) (08/22 2100) BP: 92/71 (08/22 2100) Pulse Rate: 53 (08/22 2100) Intake/Output from previous day: 08/21 0701 - 08/22 0700 In: 1281.6 [I.V.:1031.6; IV Piggyback:250] Out: 320 [Urine:320] Intake/Output from this shift: Total I/O In: 358.5 [I.V.:358.5] Out: 35 [Urine:35]  Labs:  Recent Labs  02/25/17 2240 02/26/17 0214 02/26/17 0340 02/26/17 0612  02/26/17 1235 02/26/17 1237 02/26/17 1620 02/26/17 2049  WBC 19.2* 29.4* 26.1*  --   --   --   --   --   --   HGB 13.4 12.1 12.7  --   --   --   --   --   --   HCT 39.1 35.5 36.8  --   --   --   --   --   --   PLT 511* 448* 460*  --   --   --   --   --   --   APTT  --   --  30 33  --   --  34  --   --   CREATININE 1.14* 0.92 1.08* 1.05*  < > 1.05*  --  1.16* 1.07*  MG  --   --  1.8  --   --  2.2  --  2.8*  --   PHOS  --   --  3.6  --   --   --   --   --   --   AST  --   --   --   --   --   --   --  573*  --   ALT  --   --   --   --   --   --   --  416*  --   < > = values in this interval not displayed. Estimated Creatinine Clearance: 59.3  mL/min (A) (by C-G formula based on SCr of 1.07 mg/dL (H)).  Potassium (mmol/L)  Date Value  02/26/2017 3.8   Sodium (mmol/L)  Date Value  02/26/2017 135  08/16/2016 141   Calcium (mg/dL)  Date Value  98/42/1031 7.8 (L)     Assessment: 56 y/o F on hypothermia protocol s/p cardiac arrest. Cardiology would like a goal K of > or = 4 mg/dL but will set an arbitrary stop on this goal for 3 am due to rewarming to start around 7 am.   Plan:  K replaced this AM with 4 runs of KCl IV and 60 meq KCl via tube. Will replace KCl with 40 meq VT x 1 and f/u repeat BMET at 2000.  8/22: K @ 20:49 = 3.8.  Will order KCl 10 mEq IV X 4 and  recheck electrolytes on 8/23 @ 00:00.    Robbins,Jason D 02/26/2017,9:45 PM    8/23 0030 K+ 3.6. Supplementation continuing. Will not order additional potassium supplement as this order will finish near the above mentioned stop time of 0300.  Fulton Reek, PharmD, BCPS  02/27/17 1:51 AM

## 2017-02-26 NOTE — Progress Notes (Addendum)
[facility] [place] [facility]   PATIENT NAME: Miranda [name]    MR#:  [id number]  DATE OF BIRTH:  May 02, 1961  SUBJECTIVE:   Patient came in with cardiac arrest at home. She is currently intubated on the ventilator REVIEW OF SYSTEMS:   Review of Systems  Unable to perform ROS: Intubated    DRUG ALLERGIES:   Allergies  Allergen Reactions  . Ace Inhibitors   . Compazine [Prochlorperazine Edisylate]   . Lisinopril   . Penicillins     Has patient had a PCN reaction causing immediate rash, facial/tongue/throat swelling, SOB or lightheadedness with hypotension: Unknown Has patient had a PCN reaction causing severe rash involving mucus membranes or skin necrosis: Unknown Has patient had a PCN reaction that required hospitalization: Unknown Has patient had a PCN reaction occurring within the last 10 years: Unknown If all of the above answers are "NO", then may proceed with Cephalosporin use.     VITALS:  Blood pressure 101/79, pulse 71, temperature (!) 93.2 F (34 C), resp. rate (!) 26, height 5\' 2"  (1.575 m), weight 88.7 kg (195 lb 8.8 oz), SpO2 95 %.  PHYSICAL EXAMINATION:   Physical Exam  GENERAL:  56 y.o.-year-old patient lying in the bed with no acute distress. Critically ill EYES: Pupils equal, round, reactive to light and accommodation. No scleral icterus. Extraocular muscles intact.  HEENT: Head atraumatic, normocephalic. Oropharynx and nasopharynx clear.  NECK:  Supple, no jugular venous distention. No thyroid enlargement, no tenderness NG tube.  LUNGS: Normal breath sounds bilaterally, no wheezing, rales, rhonchi. No use of accessory muscles of respiration.  CARDIOVASCULAR: S1, S2 normal. No murmurs, rubs, or gallops.  ABDOMEN: Soft, nontender, nondistended. Bowel sounds present. No organomegaly or mass.  EXTREMITIES: No cyanosis, clubbing or edema b/l.    NEUROLOGIC: Intubated on the ventilator PSYCHIATRIC:  Sedated and intubated on the  ventilator  SKIN: No obvious rash, lesion, or ulcer.   LABORATORY PANEL:  CBC  Recent Labs Lab 02/26/17 0340  WBC 26.1*  HGB 12.7  HCT 36.8  PLT 460*    Chemistries   Recent Labs Lab 02/26/17 0340  02/26/17 1235  NA 140  < > 137  K 2.3*  < > 2.1*  CL 104  < > 106  CO2 23  < > 19*  GLUCOSE 277*  < > 311*  BUN 21*  < > 22*  CREATININE 1.08*  < > 1.05*  CALCIUM 8.4*  < > 7.9*  MG 1.8  --   --   < > = values in this interval not displayed. Cardiac Enzymes  Recent Labs Lab 02/25/17 2240  TROPONINI 0.03*   RADIOLOGY:  Dg Chest 1 View  Result Date: 02/26/2017 CLINICAL DATA:  Central line placement EXAM: CHEST 1 VIEW COMPARISON:  02/25/2017 CXR and CT FINDINGS: New left IJ approach central line catheter tip is seen at the cavoatrial junction. No pneumothorax. There is cardiomegaly without aortic aneurysm. Diffuse airspace opacities consistent with pulmonary edema is noted. Superimposed pneumonia would be difficult to entirely exclude. Satisfactory endotracheal and gastric tube positions with endotracheal tube tip 5.3 cm above the carina and gastric tube extending into the expected location the stomach. Cholecystectomy clips are seen in the right upper quadrant. IMPRESSION: 1. Satisfactory support line and tube positions. 2. New left IJ approach central venous line is noted with tip at the cavoatrial junction. No pneumothorax. 3. Cardiomegaly with diffuse bilateral airspace opacities suspicious for pulmonary edema. Superimposed pneumonia would be  difficult to entirely exclude. Electronically Signed   By: Tollie Eth M.D.   On: 02/26/2017 03:28   Ct Head Wo Contrast  Result Date: 02/25/2017 CLINICAL DATA:  Focal neuro deficit, greater than 6 hours. Stroke suspected. Unresponsive, post CPR. EXAM: CT HEAD WITHOUT CONTRAST TECHNIQUE: Contiguous axial images were obtained from the base of the skull through the vertex without intravenous contrast. COMPARISON:  None. FINDINGS: Brain: No  evidence of acute infarction, hemorrhage, hydrocephalus, extra-axial collection or mass lesion/mass effect. Gray-white differentiation is preserved, no cerebral edema. Vascular: Atherosclerosis of skullbase vasculature without hyperdense vessel or abnormal calcification. Skull: Normal. Negative for fracture or focal lesion. Sinuses/Orbits: Paranasal sinuses and mastoid air cells are clear. The visualized orbits are unremarkable. Other: None. IMPRESSION: No acute intracranial abnormality. Electronically Signed   By: Rubye Oaks M.D.   On: 02/25/2017 23:18   Dg Chest Portable 1 View  Result Date: 02/25/2017 CLINICAL DATA:  Status post intubation. EXAM: PORTABLE CHEST 1 VIEW COMPARISON:  None. FINDINGS: The heart size and mediastinal contours are within normal limits. Endotracheal tube is seen projected over tracheal air shadow with distal tip 4 cm above the carina. Nasogastric tube is seen looped within proximal stomach. Bilateral lung opacities are noted concerning for edema or possibly inflammation. No definite pneumothorax or pleural effusion is noted. The visualized skeletal structures are unremarkable. IMPRESSION: Endotracheal and nasogastric tubes in grossly good position. Mild bilateral lung opacities are noted concerning for edema or possibly inflammation. Electronically Signed   By: Lupita Raider, M.D.   On: 02/25/2017 23:01   Ct Angio Chest Aorta W And/or Wo Contrast  Result Date: 02/25/2017 CLINICAL DATA:  Unwitnessed fall, unresponsive. EXAM: CT ANGIOGRAPHY CHEST WITH CONTRAST TECHNIQUE: Multidetector CT imaging of the chest was performed using the standard protocol during bolus administration of intravenous contrast. Multiplanar CT image reconstructions and MIPs were obtained to evaluate the vascular anatomy. CONTRAST:  100 mL of Isovue 370 intravenously. COMPARISON:  Radiograph of same day. FINDINGS: Cardiovascular: Suboptimal opacification of pulmonary arteries is noted due to timing of  contrast bolus as well as respiratory motion artifact. Large central pulmonary embolus is not identified in the main pulmonary artery or the main portions of the right or left pulmonary emboli. However, smaller peripheral pulmonary emboli cannot be excluded on the basis of this exam. There is no evidence of thoracic aortic dissection or aneurysm. No pericardial effusion is noted. Mediastinum/Nodes: Endotracheal tube is in grossly good position. No mediastinal adenopathy is noted. Thyroid gland is unremarkable. Lungs/Pleura: No pneumothorax or pleural effusion is noted. Bilateral diffuse lung opacities are noted concerning for edema or pneumonia. Upper Abdomen: Nasogastric tube tip is seen in distal stomach. Musculoskeletal: No chest wall abnormality. No acute or significant osseous findings. Review of the MIP images confirms the above findings. IMPRESSION: Suboptimal opacification of pulmonary arteries is noted due to timing of contrast bolus as well as respiratory motion artifact. Large central pulmonary embolus is not identified, but smaller peripheral emboli cannot be excluded on the basis of this exam. Bilateral lung opacities are noted concerning for edema or pneumonia. Endotracheal and nasogastric tubes appear to be in good position. Electronically Signed   By: Lupita Raider, M.D.   On: 02/25/2017 23:35   ASSESSMENT AND PLAN:   Ms. Miyasato reportedly had diarrhea over the last 2-3 days for which she was taking up to 12 Imodium per day. She was trying to stay well hydrated and had also stopped taking her blood pressure medications on  account of her GI illness. Around 9 PM, Ms. Dye's husband found the patient seated on the toilet. She attempted to stand up but passed out.  * Cardiac arrest due to ventricular fibrillation -Inciting event is still somewhat unclear, though given recent diarrhea and significant electrolyte abnormalities  Vs suspect that she may have had a primary arrhythmogenic  event. -Patient underwent urgent Catheterization last night showed no significant coronary artery disease. 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 -Currently on IV amiodarone drip, Levothroid and Neo-Synephrine secondary to cardiogenic shock -Patient currently on hypothermia protocol -Appreciate ICU attending help -CT chest negative for PE  *Hypokalemia being repeated  *Acute renal failure secondary to #1  *Metabolic acidosis secondary to #1  *Hyperglycemia -Patient currently on insulin drip -Check A1c  *DVT prophylaxis subcutaneous heparin  Spoke with daughter in the room. Patient is critically ill. Family understands. Case discussed with Care Management/Social Worker. Management plans discussed with the  family and they are in agreement.  CODE STATUS: Full  DVT Prophylaxis: Subcutaneous heparin  TOTAL TIME TAKING CARE OF THIS PATIENT: 30 minutes.  >50% time spent on counselling and coordination of care    Note: This dictation was prepared with Dragon dictation along with smaller phrase technology. Any transcriptional errors that result from this process are unintentional.  Sha Amer M.D on 02/26/2017 at 2:23 PM  Between 7am to 6pm - Pager - [phone]  After 6pm go to [email or web address] - Social research officer, government  [facility]  Office  [phone]  CC: Primary care physician; Merwyn Katos, MD

## 2017-02-26 NOTE — Progress Notes (Addendum)
Patient Name: Miranda [name] Date of Encounter: 02/26/2017  Primary Cardiologist: [facility] Problem List     Principal Problem:   Cardiac arrest with ventricular fibrillation [facility]) Active Problems:   Cardiac arrest (HCC)   Acute respiratory failure (HCC)   Hypokalemia   Encounter for central line placement     Subjective   Admitted overnight with cardiac arrest due to VF. Cardiac cath showed no significant CAD with severely reduced LVEF, which could have been a risk factor for VF, though it remains possible the CM is secondary to cardiac arrest, CPR, and defibrillation. No further ventricular ectopy noted on telemetry. Potassium improving, though remains low still at 3.4 (up from a low of 2.2). SCr 1.05, Mg++ 1.8, WBC 26.1. HGB 12.7, PLT 460. Blood cultures no growth < 12 hours x 2, UDS negative.   She remains intubated and sedated. Pressors continue to maintain MAP > 65 mmHg.   Inpatient Medications    Scheduled Meds: . artificial tears  1 application Both Eyes Q8H  . aspirin  300 mg Rectal NOW  . fentaNYL (SUBLIMAZE) injection  100 mcg Intravenous Once  . heparin  5,000 Units Subcutaneous Q8H  . midazolam  2 mg Intravenous Once  . midazolam  2 mg Intravenous Once  . pantoprazole (PROTONIX) IV  40 mg Intravenous Q24H  . sodium bicarbonate  50 mEq Intravenous Once  . sodium chloride flush  3 mL Intravenous Q12H   Continuous Infusions: . sodium chloride    . sodium chloride 75 mL/hr at 02/26/17 0320  . amiodarone 60 mg/hr (02/26/17 0321)  . amiodarone    . cisatracurium (NIMBEX) infusion 1 mcg/kg/min (02/26/17 0358)  . famotidine (PEPCID) IV Stopped (02/26/17 0417)  . fentaNYL infusion INTRAVENOUS 50 mcg/hr (02/26/17 0506)  . midazolam (VERSED) infusion 1 mg/hr (02/26/17 1610)  . norepinephrine (LEVOPHED) Adult infusion 50 mcg/min (02/26/17 9604)  . phenylephrine (NEO-SYNEPHRINE) Adult infusion 40 mcg/min (02/26/17 5409)   PRN Meds: sodium chloride,  [COMPLETED] cisatracurium **AND** cisatracurium (NIMBEX) infusion **AND** cisatracurium, fentaNYL, midazolam, midazolam, midazolam, sodium chloride flush   Vital Signs    Vitals:   02/26/17 0500 02/26/17 0501 02/26/17 0600 02/26/17 0700  BP: (!) 75/40  (!) 63/51 (!) 73/59  Pulse: 82     Resp: 20  20 20   Temp: (!) 95 F (35 C)  (!) 93 F (33.9 C) (!) 91.8 F (33.2 C)  TempSrc: Core (Comment)  Core (Comment) Core (Comment)  SpO2: 100% 100%  98%  Weight:      Height:        Intake/Output Summary (Last 24 hours) at 02/26/17 0718 Last data filed at 02/26/17 0705  Gross per 24 hour  Intake          1481.59 ml  Output              320 ml  Net          1161.59 ml   Filed Weights   02/25/17 2304 02/26/17 0226  Weight: 195 lb 8.8 oz (88.7 kg) 195 lb 8.8 oz (88.7 kg)    Physical Exam    GEN: Critically ill appearing, in no acute distress.  HEENT: Grossly normal.  Neck: Supple, no JVD, carotid bruits, or masses. Cardiac: RRR, no murmurs, rubs, or gallops. No clubbing, cyanosis, edema.  Radials/DP/PT 2+ and equal bilaterally.  Respiratory:  Diminished breath sounds bilaterally. Intubated.  GI: Soft, nontender, nondistended, BS + x 4. MS: No deformity or atrophy. Skin: Cool and  dry, no rash. Neuro:  Intubated and sedated. Psych: Intubated and sedated.  Labs    CBC  Recent Labs  02/25/17 2240 02/26/17 0214 02/26/17 0340  WBC 19.2* 29.4* 26.1*  NEUTROABS 7.8*  --   --   HGB 13.4 12.1 12.7  HCT 39.1 35.5 36.8  MCV 89.5 89.9 89.4  PLT 511* 448* 460*   Basic Metabolic Panel  Recent Labs  02/26/17 0340 02/26/17 0612  NA 140 136  K 2.3* 3.4*  CL 104 104  CO2 23 19*  GLUCOSE 277* 354*  BUN 21* 22*  CREATININE 1.08* 1.05*  CALCIUM 8.4* 8.2*  MG 1.8  --   PHOS 3.6  --    Liver Function Tests No results for input(s): AST, ALT, ALKPHOS, BILITOT, PROT, ALBUMIN in the last 72 hours. No results for input(s): LIPASE, AMYLASE in the last 72 hours. Cardiac  Enzymes  Recent Labs  02/25/17 2240  TROPONINI 0.03*   BNP Invalid input(s): POCBNP D-Dimer No results for input(s): DDIMER in the last 72 hours. Hemoglobin A1C No results for input(s): HGBA1C in the last 72 hours. Fasting Lipid Panel No results for input(s): CHOL, HDL, LDLCALC, TRIG, CHOLHDL, LDLDIRECT in the last 72 hours. Thyroid Function Tests No results for input(s): TSH, T4TOTAL, T3FREE, THYROIDAB in the last 72 hours.  Invalid input(s): FREET3  Telemetry    NSR to sinus tach, 80s to 110s bpm, no ventricular ectopy - Personally Reviewed  ECG    n/a - Personally Reviewed  Radiology    Dg Chest 1 View  Result Date: 02/26/2017 IMPRESSION: 1. Satisfactory support line and tube positions. 2. New left IJ approach central venous line is noted with tip at the cavoatrial junction. No pneumothorax. 3. Cardiomegaly with diffuse bilateral airspace opacities suspicious for pulmonary edema. Superimposed pneumonia would be difficult to entirely exclude. Electronically Signed   By: Tollie Eth M.D.   On: 02/26/2017 03:28   Ct Head Wo Contrast  Result Date: 02/25/2017 IMPRESSION: No acute intracranial abnormality. Electronically Signed   By: Rubye Oaks M.D.   On: 02/25/2017 23:18   Dg Chest Portable 1 View  Result Date: 02/25/2017 IMPRESSION: Endotracheal and nasogastric tubes in grossly good position. Mild bilateral lung opacities are noted concerning for edema or possibly inflammation. Electronically Signed   By: Lupita Raider, M.D.   On: 02/25/2017 23:01   Ct Angio Chest Aorta W And/or Wo Contrast  Result Date: 02/25/2017 IMPRESSION: Suboptimal opacification of pulmonary arteries is noted due to timing of contrast bolus as well as respiratory motion artifact. Large central pulmonary embolus is not identified, but smaller peripheral emboli cannot be excluded on the basis of this exam. Bilateral lung opacities are noted concerning for edema or pneumonia. Endotracheal and  nasogastric tubes appear to be in good position. Electronically Signed   By: Lupita Raider, M.D.   On: 02/25/2017 23:35    Cardiac Studies   LHC 02/26/2017: Conclusion   Conclusions: 1. No angiographically significant coronary artery disease. 2. Severely reduced left ventricular contraction (<25%) with moderately elevated left ventricular filling pressure (LVEDP 28 mmHg).  Recommendations: 1. Hypothermia protocol per hospitalist and ICU teams. 2. Check magnesium level; replete electrolytes for goal potassium and magnesium greater than 4.0 and 2.0, respectively. 3. Consider gentle diuresis, given elevated LVEDP and severely reduced LVEF. 4. Obtain transthoracic echocardiogram.     TTE pending  Patient Profile     56 y.o. female with history of HTN, migraine disorder, and dependent edema who  has had diarrhea for the past 2-3 days, taking up to 12 Imodium daily presented to [facility] with VF arrest. Cardiac cath showed no significant CAD.  Assessment & Plan    1. VF arrest: -Inciting event is still somewhat unclear, though given recent diarrhea and significant electrolyte abnormalities on admission. It has been suspect that she may have had a primary arrhythmogenic event. Catheterization showed no significant coronary artery disease. 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 -Continue to replete potassium via IV to a goal of at least 4.0 -Replete magnesium to a goal of at least 2.0 -Discontinue amiodarone once potassium is > 4.0 and magnesium > 2.0 as long as there are no further significant arrhythmias noted on telemetry  -Hypothermia protocol per PCCM -TTE pending -If meaningful neurologic recovery occurs, patient will need EP consultation to discuss ICD placement for secondary prevention  2. Acute hypoxic respiratory failure: -Remains  intubated and sedate -Wean oxygen as able per PCCM -CT chest with bilateral airspace disease, which could reflect aspiration pneumonitis, contusions, and/or pulmonary edema -Gentle diuresis as below  3. Acute systolic CHF: -Severely reduced LVEF by LV gram -TTE pending -Gentle diuresis as BP will allow -Not on BB, ACEi, ARB, ANRI given cardiogenic shock  4. Cardiogenic shock: -Pressors per PCCM -On Fentanyl, Propofol, Versed, consider weaning some of this -May need transfer to [facility] for supportive care/Impella/possible LVAD if stable -BP taken by cuff -Consider A-line -Maintain MAP > 65 mmHg  5. AKI: -Likely ATN in the setting of cardiogenic shock -Gentle IV fluids -Monitor  6. Hypokalemia: -Replete via IV to goal of > 4.0  7. Leukocytosis: -Possibly inflammatory given the above -Cultures pending -Per PCCM  8. Hyperglycemia: -Per IM    Signed, Eula Listen, PA-C [facility] Pager: [phone] 02/26/2017, 7:18 AM   Attending Note Patient seen and examined, agree with detailed note above,  Patient presentation and plan discussed on rounds.   EKG lab work, chest x-ray, echocardiogram reviewed independently by myself  Echo showing EF 10 to 15%, basal region motion best preserved Currently on propofol, versed, fentanyl Neo 150 ug, SBP 80 to 90s 100% FiO2 Tele reviewed, no arrhythmia overnight Currently intubated, sedated Chest x-ray reviewed showing bilateral pulmonary edema  On physical exam, intubated and sedated,heart sounds regular with no murmurs appreciated normal S1-S2, lungs with crackles at the bases, unable to estimate JVP, abdomen soft nontender, no significant LE  edema  Lab work reviewed showing potassim.3 up to 3.4, creatinine 1.05  --- A. Fib arest Severely depressed ejection fraction 10-15% global Cardiac catheterization  No significant coronary disease  nonischemic cardiomyopathy  currently on high-dose pressors, Hypotension, SBP 80 to  90 Critical state,  I will touch base with [place] so they are aware, If there is no clear improvement in hemodynamics, may need to transfer for additional supportive care --We will wean off neo, up levo, start epi.   ---Hypokalemia: Continue to replete to >4  Case discussed with critical care doctor, [place], and Dr. Gala Romney in [place] Greater than 50% was spent in counseling and coordination of care with patient Total encounter time 60 minutes or more   Signed: Dossie Arbour  M.D., Ph.D. [facility]

## 2017-02-26 NOTE — Progress Notes (Signed)
Inpatient Diabetes Program Recommendations  AACE/ADA: New Consensus Statement on Inpatient Glycemic Control (2015)  Target Ranges:  Prepandial:   less than 140 mg/dL      Peak postprandial:   less than 180 mg/dL (1-2 hours)      Critically ill patients:  140 - 180 mg/dL  Results for Miranda [name], Miranda [name] (MRN [id number]) as of 02/26/2017 09:57  Ref. Range 02/25/2017 22:40 02/26/2017 02:14 02/26/2017 03:40 02/26/2017 06:12 02/26/2017 08:20  Glucose Latest Ref Range: 65 - 99 mg/dL 160 (H) 737 (H) 106 (H) 354 (H) 340 (H)    Review of Glycemic Control  Diabetes history: No Outpatient Diabetes medications: NA Current orders for Inpatient glycemic control: ICU Glycemic Control order set Phase 2 (IV insulin)  Inpatient Diabetes Program Recommendations:  HgbA1C: Please consider ordering an A1C to evaluate glycemic control over the past 2-3 months.  Thanks, Orlando Penner, RN, MSN, CDE Diabetes Coordinator Inpatient Diabetes Program [phone] (Team Pager from 8am to 5pm)

## 2017-02-26 NOTE — Consult Note (Signed)
Cardiology Consultation:   Patient ID: Nichele Slawson; [id number]; 09/17/1960   Admit date: 02/25/2017 Date of Consult: 02/26/2017  Primary Care Provider: Judeen Hammans, MD Primary Cardiologist: Dossie Arbour, MD PhD Primary Electrophysiologist:  None   Patient Profile:   Wilna Pennie is a 56 y.o. female with a hx of hypertension who is being seen today for the evaluation of cardiac arrest due to reported ventricular fibrillation at the request of Willy Eddy, MD. History is obtained from the patient's husband and the chart, as Ms. Brammer is intubated and unresponsive.  History of Present Illness:   Ms. Bradt reportedly had diarrhea over the last 2-3 days for which she was taking up to 12 Imodium per day. She was trying to stay well hydrated and had also stopped taking her blood pressure medications on account of her GI illness. Around 9 PM, Ms. Divis's husband found the patient seated on the toilet. She attempted to stand up but passed out. Her husband was able to catch her and lower her to the ground. She remained unresponsive with agonal breathing and absent pulse. He began performing CPR. EMS arrived ~5-10 minutes later and administered a single shock via AED. When paramedics arrived, Ms. Bodine was noted to be in ventricular fibrillation and received 2 additional shocks with restoration of sinus rhythm and return of a pulse. A king airway was placed and the patient transported to [facility] ED. She remained unresponsive and underwent CTA of the chest, which was negative for PE but showed bilateral airspace disease. Labs were notable for K 2.9 and lactic acid of 7.2. Initial troponin was 0.03.  Given VF arrest, Ms. Edberg was referred for urgent left heart catheterization, though EKG did not show evidence of ST segment elevation. Catheterization showed severely reduced LVEF with moderately elevated LVEDP but no significant coronary artery disease.  Past Medical History:  Diagnosis  Date  . Dependent edema    bilateral legs  . Hot flashes   . HTN (hypertension)   . Migraines   . Nausea     Past Surgical History:  Procedure Laterality Date  . APPENDECTOMY  1974  . CHOLECYSTECTOMY  1984  . TONSILLECTOMY  1964     Home Medications:  Prior to Admission medications   Medication Sig Start Date Lonney Revak Date Taking? Authorizing Provider  acetaminophen (TYLENOL) 500 MG tablet Take 1,000 mg by mouth every 6 (six) hours as needed.    [provider]  amLODipine (NORVASC) 10 MG tablet Take 10 mg by mouth daily.    [provider]  hydrochlorothiazide (HYDRODIURIL) 25 MG tablet Take 1 tablet (25 mg total) by mouth daily. 08/16/16 11/14/16  Antonieta Iba, MD  ibuprofen (ADVIL,MOTRIN) 200 MG tablet Take 600 mg by mouth every 6 (six) hours as needed.    [provider]  olmesartan (BENICAR) 40 MG tablet Take 1 tablet (40 mg total) by mouth daily. 01/16/17   Antonieta Iba, MD  Olmesartan-Amlodipine-HCTZ 40-10-25 MG TABS Take 1 tablet by mouth daily. 08/20/16   Antonieta Iba, MD  potassium chloride (K-DUR) 10 MEQ tablet Take 1 tablet (10 mEq total) by mouth daily as needed. 08/20/16 11/18/16  Antonieta Iba, MD    Inpatient Medications: Scheduled Meds: . [MAR Hold] midazolam  2 mg Intravenous Once   Continuous Infusions: . [MAR Hold] potassium chloride    . [MAR Hold] propofol (DIPRIVAN) infusion     PRN Meds:   Allergies:    Allergies  Allergen Reactions  .  Ace Inhibitors   . Compazine [Prochlorperazine Edisylate]   . Lisinopril   . Penicillins     Social History:   Social History   Social History  . Marital status: Married    Spouse name: N/A  . Number of children: N/A  . Years of education: N/A   Occupational History  . Not on file.   Social History Main Topics  . Smoking status: Never Smoker  . Smokeless tobacco: Never Used  . Alcohol use No  . Drug use: No  . Sexual activity: Not on file   Other Topics Concern    . Not on file   Social History Narrative  . No narrative on file    Family History:    Family History  Problem Relation Age of Onset  . Lymphoma Mother   . Heart disease Father   . Stroke Father   . Hyperlipidemia Father   . Hypertension Father   . Hypertension Sister   . Migraines Sister   . Hypertension Brother      ROS:   Review of Systems  Unable to perform ROS: intubated     Physical Exam/Data:   Vitals:   02/26/17 0011 02/26/17 0013 02/26/17 0015 02/26/17 0016  BP:   (!) 102/58   Pulse: (!) 106 (!) 105 (!) 110 (!) 109  Resp: 20 (!) 23 (!) 22 (!) 22  Temp: 98.6 F (37 C) 98.6 F (37 C) 98.6 F (37 C) 98.5 F (36.9 C)  SpO2: 90% (!) 88% 90% (!) 89%  Weight:        Intake/Output Summary (Last 24 hours) at 02/26/17 0126 Last data filed at 02/26/17 0005  Gross per 24 hour  Intake                0 ml  Output               20 ml  Net              -20 ml   Filed Weights   02/25/17 2304  Weight: 195 lb 8.8 oz (88.7 kg)   Body mass index is 38.19 kg/m.  General:  Obese woman, intubated and unresponsive HEENT: normal Lymph: no adenopathy Neck: Unable to assess JVP, as patient is supine and intubated. Endocrine:  No thryomegaly Vascular: No carotid bruits; FA pulses 2+ bilaterally without bruits  Cardiac:  normal S1, S2; RRR; no murmur Lungs:  Coarse breath sounds anteriorly.  Abd: soft, nontender, no hepatomegaly; absent bowel sounds Ext: no edema Musculoskeletal:  No deformities, BUE and BLE strength normal and equal Skin: Cool and clamy Neuro:  Intubated and unresponsive. Psych:  Normal affect   EKG:  The EKG was personally reviewed and demonstrates:  Sinus tachycardia with 1st degree AV block with non-specific IVCD, left axis deviation, non-specific ST changes, and prolonged QT.  Relevant CV Studies: LHC (02/26/17): Conclusions: 1. No angiographically significant coronary artery disease. 2. Severely reduced left ventricular contraction (<25%)  with moderately elevated left ventricular filling pressure (LVEDP 28 mmHg).  Laboratory Data:  Chemistry Recent Labs Lab 02/25/17 2240  NA 141  K 2.9*  CL 101  CO2 22  GLUCOSE 284*  BUN 19  CREATININE 1.14*  CALCIUM 9.4  GFRNONAA 52*  GFRAA >60  ANIONGAP 18*    No results for input(s): PROT, ALBUMIN, AST, ALT, ALKPHOS, BILITOT in the last 168 hours. Hematology Recent Labs Lab 02/25/17 2240  WBC 19.2*  RBC 4.37  HGB 13.4  HCT  39.1  MCV 89.5  MCH 30.6  MCHC 34.2  RDW 12.6  PLT 511*   Cardiac Enzymes Recent Labs Lab 02/25/17 2240  TROPONINI 0.03*   No results for input(s): TROPIPOC in the last 168 hours.  BNPNo results for input(s): BNP, PROBNP in the last 168 hours.  DDimer No results for input(s): DDIMER in the last 168 hours.  Radiology/Studies:  Ct Head Wo Contrast  Result Date: 02/25/2017 CLINICAL DATA:  Focal neuro deficit, greater than 6 hours. Stroke suspected. Unresponsive, post CPR. EXAM: CT HEAD WITHOUT CONTRAST TECHNIQUE: Contiguous axial images were obtained from the base of the skull through the vertex without intravenous contrast. COMPARISON:  None. FINDINGS: Brain: No evidence of acute infarction, hemorrhage, hydrocephalus, extra-axial collection or mass lesion/mass effect. Gray-white differentiation is preserved, no cerebral edema. Vascular: Atherosclerosis of skullbase vasculature without hyperdense vessel or abnormal calcification. Skull: Normal. Negative for fracture or focal lesion. Sinuses/Orbits: Paranasal sinuses and mastoid air cells are clear. The visualized orbits are unremarkable. Other: None. IMPRESSION: No acute intracranial abnormality. Electronically Signed   By: Rubye Oaks M.D.   On: 02/25/2017 23:18   Dg Chest Portable 1 View  Result Date: 02/25/2017 CLINICAL DATA:  Status post intubation. EXAM: PORTABLE CHEST 1 VIEW COMPARISON:  None. FINDINGS: The heart size and mediastinal contours are within normal limits. Endotracheal tube  is seen projected over tracheal air shadow with distal tip 4 cm above the carina. Nasogastric tube is seen looped within proximal stomach. Bilateral lung opacities are noted concerning for edema or possibly inflammation. No definite pneumothorax or pleural effusion is noted. The visualized skeletal structures are unremarkable. IMPRESSION: Endotracheal and nasogastric tubes in grossly good position. Mild bilateral lung opacities are noted concerning for edema or possibly inflammation. Electronically Signed   By: Lupita Raider, M.D.   On: 02/25/2017 23:01   Ct Angio Chest Aorta W And/or Wo Contrast  Result Date: 02/25/2017 CLINICAL DATA:  Unwitnessed fall, unresponsive. EXAM: CT ANGIOGRAPHY CHEST WITH CONTRAST TECHNIQUE: Multidetector CT imaging of the chest was performed using the standard protocol during bolus administration of intravenous contrast. Multiplanar CT image reconstructions and MIPs were obtained to evaluate the vascular anatomy. CONTRAST:  100 mL of Isovue 370 intravenously. COMPARISON:  Radiograph of same day. FINDINGS: Cardiovascular: Suboptimal opacification of pulmonary arteries is noted due to timing of contrast bolus as well as respiratory motion artifact. Large central pulmonary embolus is not identified in the main pulmonary artery or the main portions of the right or left pulmonary emboli. However, smaller peripheral pulmonary emboli cannot be excluded on the basis of this exam. There is no evidence of thoracic aortic dissection or aneurysm. No pericardial effusion is noted. Mediastinum/Nodes: Endotracheal tube is in grossly good position. No mediastinal adenopathy is noted. Thyroid gland is unremarkable. Lungs/Pleura: No pneumothorax or pleural effusion is noted. Bilateral diffuse lung opacities are noted concerning for edema or pneumonia. Upper Abdomen: Nasogastric tube tip is seen in distal stomach. Musculoskeletal: No chest wall abnormality. No acute or significant osseous findings.  Review of the MIP images confirms the above findings. IMPRESSION: Suboptimal opacification of pulmonary arteries is noted due to timing of contrast bolus as well as respiratory motion artifact. Large central pulmonary embolus is not identified, but smaller peripheral emboli cannot be excluded on the basis of this exam. Bilateral lung opacities are noted concerning for edema or pneumonia. Endotracheal and nasogastric tubes appear to be in good position. Electronically Signed   By: Lupita Raider, M.D.   On:  02/25/2017 23:35    Assessment and Plan:   Cardiac arrest due to ventricular fibrillation Inciting event is still somewhat unclear, though given recent diarrhea and significant electrolyte abnormalities on admission, I suspect that she may have had a primary arrhythmogenic event. Catheterization showed no significant coronary artery disease. 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  Replete potassium for goal > 4.  Check magnesium and replete to keep > 2.  Obtain transthoracic echocardiogram.  Continue amiodarone infusion for now; low threshold to discontinue once electrolytes have been repleted and no significant arrhythmias noted on telemetry.  Therapeutic hypothermia per hospitalist and critical care teams.  If meaningful neurologic recovery occurs, patient will need EP consultation to discuss ICD placement for secondary prevention.  Hypertension Blood pressure borderline low at the moment.  Hold home blood pressure medications.  Acute hypoxic respiratory failure Patient requiring significant ventilator support to maintain oxygen saturation. CT chest with bilateral airspace disease, which could reflect aspiration pneumonitis, contusions, and/or pulmonary edema.  Vent management and antimicrobial therapy per CCM and hospitalist  service.  Consider gentle diuresis.  Disposition:  Admit to ICU under hospitalist service. We will continue to follow along with you.  Signed, Yvonne Kendall, MD  02/26/2017 1:26 AM

## 2017-02-26 NOTE — Progress Notes (Signed)
Initial Nutrition Assessment  DOCUMENTATION CODES:   Obesity unspecified  INTERVENTION:  No plan for tube feeds at this time. Patient is hemodynamically unstable and is on hypothermia protocol.  Once patient is hemodynamically stable and rewarmed, consider initiation of adult tube feeding protocol at that time. If TFs are initiated, goal regimen would be Vital High Protein at 50 ml/hr (1200 kcal, 105 grams of protein, 1008 ml H2O daily).  NUTRITION DIAGNOSIS:   Inadequate oral intake related to inability to eat (pt ventilated and sedated) as evidenced by NPO status.  GOAL:   Provide needs based on ASPEN/SCCM guidelines  MONITOR:   Vent status, Labs, Weight trends, TF tolerance, I & O's  REASON FOR ASSESSMENT:   Ventilator    ASSESSMENT:   56 year old female with PMHx of HTN, migraines, edema to bilateral legs. Patient had diarrhea for 2-3 days PTA and was taking up to 12 imodium per day, staying well-hydrated, and stopped taking antihypertensives. She suffered V-fib arrest at home, was intubated in the field after ROSC.   -Patient was emergently taken to Cath lab and underwent left heart catheterization concerning for severely reduced LVEF (15%). -Hypothermia protocol was initiated.  Per weight history in chart patient is weight stable. She was 195.5 lbs on 08/16/2016.   Access: OGT placed 8/21 currently on LIS; No abdominal x-ray available, but per chest x-ray on 8/22 gastric tube extends into expected location of the stomach  MAP: 51-91 mmHg  Patient is currently intubated on ventilator support MV: 9 L/min Temp (24hrs), Avg:94.7 F (34.8 C), Min:88.3 F (31.3 C), Max:98.6 F (37 C)  Propofol: 5.3 ml/hr (140 kcal/day)  Medications reviewed and include: pantoprazole, amiodarone, Nimbex gtt, epinephrine gtt (currently 3.8 ml/hr), famotidine, fentanyl, regular insulin gtt, norepinephrine gtt (currently at 46.9 ml/hr - max), phenylephrine gtt (currently at 56.3 ml/hr -  earlier this AM was at 150 ml/hr, which is max), propofol gtt.  Labs reviewed: CBG 192-330, Potassium 3.4, CO2 20, BUN 21, Creatinine 1.26, Anion gap 16.  Unable to complete Nutrition-Focused physical exam at this time.  Diet Order:     Skin:  Reviewed, no issues  Last BM:  Unknown  Height:   Ht Readings from Last 1 Encounters:  02/26/17 5\' 2"  (1.575 m)    Weight:   Wt Readings from Last 1 Encounters:  02/26/17 195 lb 8.8 oz (88.7 kg)    Ideal Body Weight:  50 kg  BMI:  Body mass index is 35.77 kg/m.  Estimated Nutritional Needs:   Kcal:  919-1660 (11-14 kcal/kg)  Protein:  >/= 100 grams (>/= 2 grams/kg IBW)  Fluid:  1.5-1.75 L/day (30-35 ml/kg IBW)  EDUCATION NEEDS:   No education needs identified at this time  Helane Rima, MS, RD, LDN Pager: [phone] After Hours Pager: [phone]

## 2017-02-26 NOTE — ED Notes (Signed)
Hypothermia protocols initiated. Ice packs applied to pt. Family informed and educated on reasons why.

## 2017-02-26 NOTE — ED Notes (Signed)
Cardiologist at bedside, Family updating doctor on what had happened prior to arrest. RT at bedside.

## 2017-02-26 NOTE — Progress Notes (Signed)
[facility] received a phone call that PT in Cardiac distress was brought into ED. PT's husband and son were in family room and in need of [facility] presence. [facility] arrived at ED and met PT's family and set with them until husband said that he was ok without me. Ch let husband know that Ch was available if needed. [place] prayed silently and departed.    02/25/17 2250  Clinical Encounter Type  Visited With Patient and family together  Visit Type Code  Referral From Nurse  Consult/Referral To Chaplain  Spiritual Encounters  Spiritual Needs Prayer;Emotional;Grief support

## 2017-02-26 NOTE — Progress Notes (Signed)
Spoke to [employer], NP regarding pt's HR decreasing to 49bpm over last 30 minutes.  Epi drip still at max dose.  Orders received to keep atropine at bedside for HR <40.  Sedation also titrated to help improve HR.  BP remains stable. Will continue to monitor.

## 2017-02-26 NOTE — Progress Notes (Signed)
Chaplain visit to provide pastoral care and support to pt and her family. Pt's family were in the visitors room at the time as the nurse were attending to pt. CH offered silent prayers and left to attend to a pt in ED. CH returned again to see pt at 2:58 and met with pt, pt's husband and daughter, pt's workmate. [place] bed was also present in the Rm. Pt's family appeared anxious. At the family request, [place] offered prayers for pt, family, and pt's co-workers. [facility] will follow up this pt as needed.   02/26/17 1646  Clinical Encounter Type  Visited With Patient and family together  Visit Type Initial;Follow-up  Referral From Nurse  Consult/Referral To Chaplain  Spiritual Encounters  Spiritual Needs Prayer;Emotional

## 2017-02-27 ENCOUNTER — Inpatient Hospital Stay: Payer: BC Managed Care – PPO

## 2017-02-27 DIAGNOSIS — E876 Hypokalemia: Secondary | ICD-10-CM

## 2017-02-27 DIAGNOSIS — R001 Bradycardia, unspecified: Secondary | ICD-10-CM

## 2017-02-27 DIAGNOSIS — I420 Dilated cardiomyopathy: Secondary | ICD-10-CM

## 2017-02-27 LAB — BASIC METABOLIC PANEL
ANION GAP: 6 (ref 5–15)
Anion gap: 10 (ref 5–15)
Anion gap: 6 (ref 5–15)
Anion gap: 7 (ref 5–15)
Anion gap: 9 (ref 5–15)
BUN: 21 mg/dL — AB (ref 6–20)
BUN: 21 mg/dL — ABNORMAL HIGH (ref 6–20)
BUN: 22 mg/dL — AB (ref 6–20)
BUN: 22 mg/dL — AB (ref 6–20)
BUN: 23 mg/dL — AB (ref 6–20)
CALCIUM: 7.9 mg/dL — AB (ref 8.9–10.3)
CALCIUM: 7.9 mg/dL — AB (ref 8.9–10.3)
CHLORIDE: 110 mmol/L (ref 101–111)
CHLORIDE: 109 mmol/L (ref 101–111)
CO2: 15 mmol/L — ABNORMAL LOW (ref 22–32)
CO2: 17 mmol/L — ABNORMAL LOW (ref 22–32)
CO2: 17 mmol/L — ABNORMAL LOW (ref 22–32)
CO2: 18 mmol/L — ABNORMAL LOW (ref 22–32)
CO2: 19 mmol/L — ABNORMAL LOW (ref 22–32)
CREATININE: 0.86 mg/dL (ref 0.44–1.00)
CREATININE: 0.97 mg/dL (ref 0.44–1.00)
CREATININE: 0.99 mg/dL (ref 0.44–1.00)
CREATININE: 1.09 mg/dL — AB (ref 0.44–1.00)
Calcium: 8 mg/dL — ABNORMAL LOW (ref 8.9–10.3)
Calcium: 8.2 mg/dL — ABNORMAL LOW (ref 8.9–10.3)
Calcium: 8.1 mg/dL — ABNORMAL LOW (ref 8.9–10.3)
Chloride: 110 mmol/L (ref 101–111)
Chloride: 111 mmol/L (ref 101–111)
Chloride: 111 mmol/L (ref 101–111)
Creatinine, Ser: 0.98 mg/dL (ref 0.44–1.00)
GFR calc Af Amer: >60 mL/min (ref >60)
GFR calc Af Amer: >60 mL/min (ref >60)
GFR calc Af Amer: >60 mL/min (ref >60)
GFR calc Af Amer: >60 mL/min (ref >60)
GFR, EST NON AFRICAN AMERICAN: 55 mL/min — AB (ref >60)
Glucose, Bld: 135 mg/dL — ABNORMAL HIGH (ref 65–99)
Glucose, Bld: 173 mg/dL — ABNORMAL HIGH (ref 65–99)
Glucose, Bld: 253 mg/dL — ABNORMAL HIGH (ref 65–99)
Glucose, Bld: 312 mg/dL — ABNORMAL HIGH (ref 65–99)
Glucose, Bld: 358 mg/dL — ABNORMAL HIGH (ref 65–99)
POTASSIUM: 3.7 mmol/L (ref 3.5–5.1)
POTASSIUM: 3.5 mmol/L (ref 3.5–5.1)
Potassium: 3.6 mmol/L (ref 3.5–5.1)
Potassium: 4 mmol/L (ref 3.5–5.1)
Potassium: 4.1 mmol/L (ref 3.5–5.1)
SODIUM: 134 mmol/L — AB (ref 135–145)
SODIUM: 136 mmol/L (ref 135–145)
SODIUM: 136 mmol/L (ref 135–145)
SODIUM: 136 mmol/L (ref 135–145)
Sodium: 133 mmol/L — ABNORMAL LOW (ref 135–145)

## 2017-02-27 LAB — GLUCOSE, CAPILLARY
GLUCOSE-CAPILLARY: 130 mg/dL — AB (ref 65–99)
GLUCOSE-CAPILLARY: 135 mg/dL — AB (ref 65–99)
GLUCOSE-CAPILLARY: 165 mg/dL — AB (ref 65–99)
GLUCOSE-CAPILLARY: 188 mg/dL — AB (ref 65–99)
GLUCOSE-CAPILLARY: 223 mg/dL — AB (ref 65–99)
GLUCOSE-CAPILLARY: 238 mg/dL — AB (ref 65–99)
GLUCOSE-CAPILLARY: 327 mg/dL — AB (ref 65–99)
Glucose-Capillary: 332 mg/dL — ABNORMAL HIGH (ref 65–99)
Glucose-Capillary: 279 mg/dL — ABNORMAL HIGH (ref 65–99)
Glucose-Capillary: 283 mg/dL — ABNORMAL HIGH (ref 65–99)
Glucose-Capillary: 255 mg/dL — ABNORMAL HIGH (ref 65–99)
Glucose-Capillary: 240 mg/dL — ABNORMAL HIGH (ref 65–99)
Glucose-Capillary: 233 mg/dL — ABNORMAL HIGH (ref 65–99)
Glucose-Capillary: 173 mg/dL — ABNORMAL HIGH (ref 65–99)
Glucose-Capillary: 116 mg/dL — ABNORMAL HIGH (ref 65–99)
Glucose-Capillary: 117 mg/dL — ABNORMAL HIGH (ref 65–99)
Glucose-Capillary: 117 mg/dL — ABNORMAL HIGH (ref 65–99)
Glucose-Capillary: 130 mg/dL — ABNORMAL HIGH (ref 65–99)

## 2017-02-27 LAB — CBC
HEMATOCRIT: 35.7 % (ref 35.0–47.0)
Hemoglobin: 12.2 g/dL (ref 12.0–16.0)
MCH: 30.4 pg (ref 26.0–34.0)
MCHC: 34.2 g/dL (ref 32.0–36.0)
MCV: 89 fL (ref 80.0–100.0)
PLATELETS: 361 10*3/uL (ref 150–440)
RBC: 4.01 MIL/uL (ref 3.80–5.20)
RDW: 12.5 % (ref 11.5–14.5)
WBC: 27.2 10*3/uL — AB (ref 3.6–11.0)

## 2017-02-27 LAB — HIV ANTIBODY (ROUTINE TESTING W REFLEX): HIV SCREEN 4TH GENERATION: NONREACTIVE

## 2017-02-27 LAB — MAGNESIUM: Magnesium: 1.7 mg/dL (ref 1.7–2.4)

## 2017-02-27 MED ORDER — POTASSIUM CHLORIDE CRYS ER 20 MEQ PO TBCR: 40.0000 meq | EXTENDED_RELEASE_TABLET | Freq: Once | ORAL | Status: DC

## 2017-02-27 MED ORDER — POTASSIUM CHLORIDE 10 MEQ/50ML IV SOLN
10.0000 meq | INTRAVENOUS | Status: AC
  Administered 2017-02-27 (×2): 10 meq via INTRAVENOUS
  Filled 2017-02-27 (×2): qty 50

## 2017-02-27 MED ORDER — LORAZEPAM 2 MG/ML IJ SOLN
2.0000 mg | INTRAMUSCULAR | Status: AC
  Administered 2017-02-27: 2 mg via INTRAVENOUS

## 2017-02-27 MED ORDER — INSULIN ASPART 100 UNIT/ML ~~LOC~~ SOLN
2.0000 [IU] | SUBCUTANEOUS | Status: DC
  Administered 2017-02-27: 2 [IU] via SUBCUTANEOUS
  Administered 2017-02-28 (×2): 6 [IU] via SUBCUTANEOUS
  Administered 2017-02-28: 4 [IU] via SUBCUTANEOUS
  Administered 2017-02-28: 2 [IU] via SUBCUTANEOUS
  Administered 2017-02-28: 4 [IU] via SUBCUTANEOUS
  Administered 2017-03-01: 2 [IU] via SUBCUTANEOUS
  Administered 2017-03-01: 4 [IU] via SUBCUTANEOUS
  Administered 2017-03-01 – 2017-03-02 (×5): 2 [IU] via SUBCUTANEOUS
  Administered 2017-03-03 (×2): 4 [IU] via SUBCUTANEOUS
  Administered 2017-03-03 – 2017-03-04 (×3): 2 [IU] via SUBCUTANEOUS
  Filled 2017-02-27 (×19): qty 1

## 2017-02-27 MED ORDER — FAMOTIDINE IN NACL 20-0.9 MG/50ML-% IV SOLN
20.0000 mg | INTRAVENOUS | Status: DC
  Administered 2017-02-27 – 2017-03-05 (×7): 20 mg via INTRAVENOUS
  Filled 2017-02-27 (×7): qty 50

## 2017-02-27 MED ORDER — LORAZEPAM 2 MG/ML IJ SOLN
2.0000 mg | INTRAMUSCULAR | Status: DC | PRN
  Administered 2017-02-27 – 2017-03-01 (×3): 2 mg via INTRAVENOUS
  Filled 2017-02-27 (×2): qty 1

## 2017-02-27 MED ORDER — PNEUMOCOCCAL VAC POLYVALENT 25 MCG/0.5ML IJ INJ: 0.5000 mL | INJECTION | INTRAMUSCULAR | Status: DC | PRN

## 2017-02-27 MED ORDER — LORAZEPAM 2 MG/ML IJ SOLN
INTRAMUSCULAR | Status: AC
Start: 2017-02-27 — End: 2017-02-27
  Filled 2017-02-27: qty 1

## 2017-02-27 MED ORDER — DEXMEDETOMIDINE HCL IN NACL 400 MCG/100ML IV SOLN
0.4000 ug/kg/h | INTRAVENOUS | Status: DC
  Administered 2017-02-27: 0.4 ug/kg/h via INTRAVENOUS
  Administered 2017-02-28: 1 ug/kg/h via INTRAVENOUS
  Administered 2017-02-28: 0.5 ug/kg/h via INTRAVENOUS
  Administered 2017-02-28 (×2): 0.6 ug/kg/h via INTRAVENOUS
  Administered 2017-03-01: 0.4 ug/kg/h via INTRAVENOUS
  Administered 2017-03-01: 0.2 ug/kg/h via INTRAVENOUS
  Administered 2017-03-02: 0.8 ug/kg/h via INTRAVENOUS
  Filled 2017-02-27 (×8): qty 100

## 2017-02-27 MED ORDER — LORAZEPAM 2 MG/ML IJ SOLN
INTRAMUSCULAR | Status: AC
  Administered 2017-02-27: 2 mg via INTRAVENOUS
  Filled 2017-02-27: qty 1

## 2017-02-27 MED ORDER — INSULIN GLARGINE 100 UNIT/ML ~~LOC~~ SOLN
10.0000 [IU] | SUBCUTANEOUS | Status: DC
  Administered 2017-02-27 – 2017-03-04 (×6): 10 [IU] via SUBCUTANEOUS
  Filled 2017-02-27 (×7): qty 0.1

## 2017-02-27 MED ORDER — MILRINONE LACTATE IN DEXTROSE 20-5 MG/100ML-% IV SOLN
0.1250 ug/kg/min | INTRAVENOUS | Status: DC
  Administered 2017-02-27: 0.25 ug/kg/min via INTRAVENOUS
  Administered 2017-02-27 – 2017-03-03 (×13): 0.5 ug/kg/min via INTRAVENOUS
  Administered 2017-03-04: 0.3 ug/kg/min via INTRAVENOUS
  Filled 2017-02-27 (×15): qty 100

## 2017-02-27 MED ORDER — DEXTROSE 10 % IV SOLN: INTRAVENOUS | Status: DC | PRN

## 2017-02-27 MED ORDER — POTASSIUM CHLORIDE 10 MEQ/50ML IV SOLN
10.0000 meq | INTRAVENOUS | Status: DC
  Filled 2017-02-27 (×4): qty 50

## 2017-02-27 NOTE — Progress Notes (Signed)
Patient Name: Miranda [name] Date of Encounter: 02/27/2017  Primary Cardiologist: [facility] Problem List     Principal Problem:   Cardiac arrest with ventricular fibrillation [facility]) Active Problems:   Cardiac arrest ([place])   Acute respiratory failure ([place])   Hypokalemia   Encounter for central line placement   Cardiogenic shock ([place])   Acute pulmonary edema ([facility])   Anoxic encephalopathy ([place])     Subjective   Remains intubated and sedated. On epi alone. Required atropine overnight. Bigeminy. Heart rates in the 60s bpm currently. WBC remains elevated at 27.2, HGB stable, K+ 3.7, SCr improved at 0.97, magnesium pending this morning. TTE as below with EF 20-25% with diffuse HK. BP in the 242A systolic. Planning to start warming ~ 7:30 AM today.   Inpatient Medications    Scheduled Meds: . artificial tears  1 application Both Eyes S3M  . chlorhexidine gluconate (MEDLINE KIT)  15 mL Mouth Rinse BID  . heparin  5,000 Units Subcutaneous Q8H  . mouth rinse  15 mL Mouth Rinse 10 times per day  . pantoprazole (PROTONIX) IV  40 mg Intravenous Q24H  . sodium chloride flush  10-40 mL Intracatheter Q12H  . sodium chloride flush  3 mL Intravenous Q12H  . sodium chloride flush  3 mL Intravenous Q12H   Continuous Infusions: . sodium chloride    . amiodarone 30 mg/hr (02/27/17 0600)  . cisatracurium (NIMBEX) infusion 0.507 mcg/kg/min (02/27/17 0600)  . epinephrine 20 mcg/min (02/27/17 0600)  . famotidine (PEPCID) IV Stopped (02/26/17 2235)  . fentaNYL infusion INTRAVENOUS 50 mcg/hr (02/27/17 0600)  . insulin (NOVOLIN-R) infusion 37.1 Units/hr (02/27/17 0605)  . norepinephrine (LEVOPHED) Adult infusion Stopped (02/27/17 0100)  . propofol (DIPRIVAN) infusion 5.073 mcg/kg/min (02/27/17 0600)   PRN Meds: sodium chloride, atropine, [COMPLETED] cisatracurium **AND** cisatracurium (NIMBEX) infusion **AND** cisatracurium, pneumococcal 23 valent vaccine, sodium chloride flush, sodium  chloride flush   Vital Signs    Vitals:   02/27/17 0300 02/27/17 0400 02/27/17 0500 02/27/17 0600  BP: 100/84 121/86 (!) 121/91 (!) 104/59  Pulse: (!) 45 (!) 56 63 (!) 50  Resp: 20 (!) '21 20 20  ' Temp: (!) 89.1 F (31.7 C) (!) 90.3 F (32.4 C) (!) 91 F (32.8 C) (!) 91.9 F (33.3 C)  TempSrc: Core (Comment) Core (Comment) Core (Comment) Core (Comment)  SpO2: 96% 96% 96% 94%  Weight:      Height:        Intake/Output Summary (Last 24 hours) at 02/27/17 0710 Last data filed at 02/27/17 0600  Gross per 24 hour  Intake          4268.15 ml  Output              735 ml  Net          3533.15 ml   Filed Weights   02/25/17 2304 02/26/17 0226  Weight: 195 lb 8.8 oz (88.7 kg) 195 lb 8.8 oz (88.7 kg)    Physical Exam    GEN: Critically ill appearing, in no acute distress.  HEENT: Grossly normal.  Neck: Supple, no JVD, carotid bruits, or masses. Cardiac: Irregular, no murmurs, rubs, or gallops. No clubbing, cyanosis, edema.  Radials/DP/PT 2+ and equal bilaterally.  Respiratory:  Diminished breath sounds bilaterally. Intubated.  GI: Soft, nontender, nondistended, BS + x 4. MS: No deformity or atrophy. Skin: Cool and dry, no rash. Neuro:  Intubated and sedated. Psych: Intubated and sedated.  Labs    CBC  Recent  Labs  02/25/17 2240  02/26/17 0340 02/27/17 0425  WBC 19.2*  < > 26.1* 27.2*  NEUTROABS 7.8*  --   --   --   HGB 13.4  < > 12.7 12.2  HCT 39.1  < > 36.8 35.7  MCV 89.5  < > 89.4 89.0  PLT 511*  < > 460* 361  < > = values in this interval not displayed. Basic Metabolic Panel  Recent Labs  02/26/17 0340  02/26/17 1235 02/26/17 1620  02/27/17 0025 02/27/17 0425  NA 140  < > 137 139  < > 136 136  K 2.3*  < > 2.1* 3.6  < > 3.6 3.7  CL 104  < > 106 108  < > 111 110  CO2 23  < > 19* 19*  < > 15* 17*  GLUCOSE 277*  < > 311* 319*  < > 358* 312*  BUN 21*  < > 22* 23*  < > 22* 23*  CREATININE 1.08*  < > 1.05* 1.16*  < > 0.99 0.97  CALCIUM 8.4*  < > 7.9* 7.9*  <  > 7.9* 8.0*  MG 1.8  --  2.2 2.8*  --   --   --   PHOS 3.6  --   --   --   --   --   --   < > = values in this interval not displayed. Liver Function Tests  Recent Labs  02/26/17 1620  AST 573*  ALT 416*   No results for input(s): LIPASE, AMYLASE in the last 72 hours. Cardiac Enzymes  Recent Labs  02/25/17 2240  TROPONINI 0.03*   BNP Invalid input(s): POCBNP D-Dimer No results for input(s): DDIMER in the last 72 hours. Hemoglobin A1C No results for input(s): HGBA1C in the last 72 hours. Fasting Lipid Panel  Recent Labs  02/26/17 1235  TRIG 161*   Thyroid Function Tests No results for input(s): TSH, T4TOTAL, T3FREE, THYROIDAB in the last 72 hours.  Invalid input(s): FREET3  Telemetry    Bradycardic overnight into the 40s bpm, bigeminy, currently sinus rhythm, 60s bpm - Personally Reviewed  ECG    n/a - Personally Reviewed  Radiology    Dg Chest 1 View  Result Date: 02/26/2017 CLINICAL DATA:  Central line placement EXAM: CHEST 1 VIEW COMPARISON:  02/25/2017 CXR and CT FINDINGS: New left IJ approach central line catheter tip is seen at the cavoatrial junction. No pneumothorax. There is cardiomegaly without aortic aneurysm. Diffuse airspace opacities consistent with pulmonary edema is noted. Superimposed pneumonia would be difficult to entirely exclude. Satisfactory endotracheal and gastric tube positions with endotracheal tube tip 5.3 cm above the carina and gastric tube extending into the expected location the stomach. Cholecystectomy clips are seen in the right upper quadrant. IMPRESSION: 1. Satisfactory support line and tube positions. 2. New left IJ approach central venous line is noted with tip at the cavoatrial junction. No pneumothorax. 3. Cardiomegaly with diffuse bilateral airspace opacities suspicious for pulmonary edema. Superimposed pneumonia would be difficult to entirely exclude. Electronically Signed   By: Ashley Royalty M.D.   On: 02/26/2017 03:28   Ct  Head Wo Contrast  Result Date: 02/25/2017 CLINICAL DATA:  Focal neuro deficit, greater than 6 hours. Stroke suspected. Unresponsive, post CPR. EXAM: CT HEAD WITHOUT CONTRAST TECHNIQUE: Contiguous axial images were obtained from the base of the skull through the vertex without intravenous contrast. COMPARISON:  None. FINDINGS: Brain: No evidence of acute infarction, hemorrhage, hydrocephalus, extra-axial collection or  mass lesion/mass effect. Gray-white differentiation is preserved, no cerebral edema. Vascular: Atherosclerosis of skullbase vasculature without hyperdense vessel or abnormal calcification. Skull: Normal. Negative for fracture or focal lesion. Sinuses/Orbits: Paranasal sinuses and mastoid air cells are clear. The visualized orbits are unremarkable. Other: None. IMPRESSION: No acute intracranial abnormality. Electronically Signed   By: Jeb Levering M.D.   On: 02/25/2017 23:18   Dg Chest Portable 1 View  Result Date: 02/25/2017 CLINICAL DATA:  Status post intubation. EXAM: PORTABLE CHEST 1 VIEW COMPARISON:  None. FINDINGS: The heart size and mediastinal contours are within normal limits. Endotracheal tube is seen projected over tracheal air shadow with distal tip 4 cm above the carina. Nasogastric tube is seen looped within proximal stomach. Bilateral lung opacities are noted concerning for edema or possibly inflammation. No definite pneumothorax or pleural effusion is noted. The visualized skeletal structures are unremarkable. IMPRESSION: Endotracheal and nasogastric tubes in grossly good position. Mild bilateral lung opacities are noted concerning for edema or possibly inflammation. Electronically Signed   By: Marijo Conception, M.D.   On: 02/25/2017 23:01   Dg Abd Portable 1v  Result Date: 02/26/2017 CLINICAL DATA:  56 y/o  F; encounter for feeding tube placement. EXAM: PORTABLE ABDOMEN - 1 VIEW COMPARISON:  None. FINDINGS: Right hemiabdomen is excluded from field of view. Normal bowel gas  pattern. Enteric tube tip projects over the distal stomach. Cholecystectomy clips. Bones are unremarkable. IMPRESSION: Enteric tube tip projects over distal stomach. Electronically Signed   By: Kristine Garbe M.D.   On: 02/26/2017 14:39   Ct Angio Chest Aorta W And/or Wo Contrast  Result Date: 02/25/2017 CLINICAL DATA:  Unwitnessed fall, unresponsive. EXAM: CT ANGIOGRAPHY CHEST WITH CONTRAST TECHNIQUE: Multidetector CT imaging of the chest was performed using the standard protocol during bolus administration of intravenous contrast. Multiplanar CT image reconstructions and MIPs were obtained to evaluate the vascular anatomy. CONTRAST:  100 mL of Isovue 370 intravenously. COMPARISON:  Radiograph of same day. FINDINGS: Cardiovascular: Suboptimal opacification of pulmonary arteries is noted due to timing of contrast bolus as well as respiratory motion artifact. Large central pulmonary embolus is not identified in the main pulmonary artery or the main portions of the right or left pulmonary emboli. However, smaller peripheral pulmonary emboli cannot be excluded on the basis of this exam. There is no evidence of thoracic aortic dissection or aneurysm. No pericardial effusion is noted. Mediastinum/Nodes: Endotracheal tube is in grossly good position. No mediastinal adenopathy is noted. Thyroid gland is unremarkable. Lungs/Pleura: No pneumothorax or pleural effusion is noted. Bilateral diffuse lung opacities are noted concerning for edema or pneumonia. Upper Abdomen: Nasogastric tube tip is seen in distal stomach. Musculoskeletal: No chest wall abnormality. No acute or significant osseous findings. Review of the MIP images confirms the above findings. IMPRESSION: Suboptimal opacification of pulmonary arteries is noted due to timing of contrast bolus as well as respiratory motion artifact. Large central pulmonary embolus is not identified, but smaller peripheral emboli cannot be excluded on the basis of this  exam. Bilateral lung opacities are noted concerning for edema or pneumonia. Endotracheal and nasogastric tubes appear to be in good position. Electronically Signed   By: Marijo Conception, M.D.   On: 02/25/2017 23:35    Cardiac Studies   LHC 02/26/2017: Conclusion   Conclusions: 1. No angiographically significant coronary artery disease. 2. Severely reduced left ventricular contraction (<25%) with moderately elevated left ventricular filling pressure (LVEDP 28 mmHg).  Recommendations: 1. Hypothermia protocol per hospitalist and ICU teams.  2. Check magnesium level; replete electrolytes for goal potassium and magnesium greater than 4.0 and 2.0, respectively. 3. Consider gentle diuresis, given elevated LVEDP and severely reduced LVEF. 4. Obtain transthoracic echocardiogram    TTE 02/26/2017: Study Conclusions  - Left ventricle: The cavity size was mildly dilated. Systolic   function was severely reduced. The estimated ejection fraction   was <20%. Diffuse hypokinesis. Regional wall motion abnormalities   cannot be excluded. The study is not technically sufficient to   allow evaluation of LV diastolic function. - Mitral valve: There was mild regurgitation. - Right ventricle: Systolic function was mildly reduced. - Pulmonary arteries: Systolic pressure could not be accurately   estimated. - Inferior vena cava: The vessel was normal in size. The   respirophasic diameter changes were in the normal range (>= 50%),   consistent with normal central venous pressure.  Patient Profile     56 y.o. female with history of HTN, migraine disorder, and dependent edema who has had diarrhea for the past 2-3 days, taking up to 12 Imodium daily presented to [facility] with VF arrest. Cardiac cath showed no significant CAD.  Assessment & Plan    1.  VF arrest: -Inciting event is still somewhat unclear, though given recent diarrhea and significant electrolyte abnormalities on admission, it has been  suspected that she may have had a primary arrhythmogenic event. Catheterization showed no significant coronary artery disease. 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 -Continue to replete potassium to a goal of at least 4.0 -Magnesium at goal on 8/22, level pending on 8/23 AM -High risk of recurrent arrhythmia, continue IV amiodarone for now -Hypothermia protocol per PCCM, planing to start warming protocol ~ 7:30 AM -TTE as above -If meaningful neurologic recovery occurs, patient will need EP consultation to discuss ICD placement for secondary prevention  2. Acute hypoxic respiratory failure: -Remains intubated and sedated -Wean oxygen as able per PCCM -CT chest with bilateral airspace disease, which could reflect aspiration pneumonitis, contusions, and/or pulmonary edema -Gentle diuresis as below  3. Acute systolic CHF: -Severely reduced LVEF by LV gram -TTE pending -Gentle diuresis as BP will allow, consider starting IV Lasix this morning  -Not on BB, ACEi, ARB, ANRI given cardiogenic shock  4. Cardiogenic shock: -Remains on epi alone, per PCCM -Remains sedated -May need transfer to [facility] for supportive care/Impella/possible LVAD if stable -A-line in place with SBP 110 mmHg -Maintain MAP > 65 mmHg  5. AKI: -Improving -Likely ATN in the setting of cardiogenic shock -Gentle IV fluids -Monitor  6. Hypokalemia: -Replete via IV to goal of > 4.0  7. Leukocytosis: -Possibly inflammatory given the above -Cultures pending -Per PCCM  8. Hyperglycemia: -Per IM  Signed, Christell Faith, PA-C [facility] Pager: [phone] 02/27/2017, 7:10 AM   1. VF arrest: -Inciting event is still somewhat unclear, though given recent diarrhea and significant electrolyte abnormalities on admission. It has been suspect that she may have had a  primary arrhythmogenic event. Catheterization showed no significant coronary artery disease. 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 -Continue to replete potassium via IV to a goal of at least 4.0 -Replete magnesium to a goal of at least 2.0 -Discontinue amiodarone once potassium is > 4.0 and magnesium > 2.0 as long as there  are no further significant arrhythmias noted on telemetry  -Hypothermia protocol per PCCM -TTE pending -If meaningful neurologic recovery occurs, patient will need EP consultation to discuss ICD placement for secondary prevention  2. Acute hypoxic respiratory failure: -Remains intubated and sedate -Wean oxygen as able per PCCM -CT chest with bilateral airspace disease, which could reflect aspiration pneumonitis, contusions, and/or pulmonary edema -Gentle diuresis as below  3. Acute systolic CHF: -Severely reduced LVEF by LV gram -TTE pending -Gentle diuresis as BP will allow -Not on BB, ACEi, ARB, ANRI given cardiogenic shock  4. Cardiogenic shock: -Pressors per PCCM -On Fentanyl, Propofol, Versed, consider weaning some of this -May need transfer to [facility] for supportive care/Impella/possible LVAD if stable -BP taken by cuff -Consider A-line -Maintain MAP > 65 mmHg  5. AKI: -Likely ATN in the setting of cardiogenic shock -Gentle IV fluids -Monitor  6. Hypokalemia: -Replete via IV to goal of > 4.0  7. Leukocytosis: -Possibly inflammatory given the above -Cultures pending -Per PCCM  8. Hyperglycemia: -Per IM    Signed, Christell Faith, PA-C [place] Pager: [phone] 02/26/2017, 7:18 AM   Attending Note Patient seen and examined, agree with detailed note above,  Patient presentation and plan discussed on rounds.  EKG lab work, chest x-ray, echocardiogram reviewed independently by  myself  Stable overnight, bradycardia, sinus bradycardia frequent runs of junctional Warming protocol started Echo showing EF 10 to 15%, yesterday Currently on propofol, versed, fentanyl On epinephrine infusion SBP 115-120 40% FiO2 Currently intubated, sedated  On physical exam, intubated and sedated,heart sounds regular, bradycardic with no murmurs appreciated normal S1-S2, lungs with crackles at the bases, unable to estimate JVP, abdomen soft nontender, no significant LE  edema  Lab work reviewed showing potassim 3.5  A/P --- A. Fib arest Severely depressed ejection fraction 10-15% global seen yesterday  Cardiac catheterization  No significant coronary disease  unable to exclude underlying  nonischemic cardiomyopathy prior to arrest  Blood pressure improved, stable On lower dose epi infusion  -Case discussed with intensivist,  Dr. Alva Garnet, agree with milrinone infusion   for junctional rhythm, can hold amiodarone infusion Rate may improve with warming  ---Hypokalemia: Continue to replete to >4  Case discussed with critical care doctor, [place] Discussed with husband at the bedside Greater than 50% was spent in counseling and coordination of care with patient Total encounter time 35 minutes or more   Signed: Esmond Plants  M.D., Ph.D. [facility]

## 2017-02-27 NOTE — Progress Notes (Signed)
[facility] - [place] [facility]   PATIENT NAME: Miranda [name]    MR#:  [id number]  DATE OF BIRTH:  19-Sep-1960  SUBJECTIVE:   Patient came in with cardiac arrest at home. She is currently intubated on the ventilator. Daughter in the room. Patient is being rewarmed REVIEW OF SYSTEMS:   Review of Systems  Unable to perform ROS: Intubated    DRUG ALLERGIES:   Allergies  Allergen Reactions  . Ace Inhibitors   . Compazine [Prochlorperazine Edisylate]   . Lisinopril   . Penicillins     Has patient had a PCN reaction causing immediate rash, facial/tongue/throat swelling, SOB or lightheadedness with hypotension: Unknown Has patient had a PCN reaction causing severe rash involving mucus membranes or skin necrosis: Unknown Has patient had a PCN reaction that required hospitalization: Unknown Has patient had a PCN reaction occurring within the last 10 years: Unknown If all of the above answers are "NO", then may proceed with Cephalosporin use.     VITALS:  Blood pressure 95/65, pulse (!) 57, temperature (!) 89.8 F (32.1 C), temperature source Core (Comment), resp. rate 20, height 5\' 2"  (1.575 m), weight 88.7 kg (195 lb 8.8 oz), SpO2 (!) 89 %.  PHYSICAL EXAMINATION:   Physical Exam  GENERAL:  56 y.o.-year-old patient lying in the bed with no acute distress. Critically ill EYES: Pupils equal, round, reactive to light and accommodation. No scleral icterus. Extraocular muscles intact.  HEENT: Head atraumatic, normocephalic. Oropharynx and nasopharynx clear. Intubated NECK:  Supple, no jugular venous distention. No thyroid enlargement, no tenderness, NG tube.  LUNGS: Normal breath sounds bilaterally, no wheezing, rales, rhonchi. No use of accessory muscles of respiration.  CARDIOVASCULAR: S1, S2 normal. No murmurs, rubs, or gallops.  ABDOMEN: Soft, nontender, nondistended. Bowel sounds present. No organomegaly or mass.  EXTREMITIES: No cyanosis, clubbing or  edema b/l.    NEUROLOGIC: Intubated on the ventilator PSYCHIATRIC:  Sedated and intubated on the ventilator  SKIN: No obvious rash, lesion, or ulcer.   LABORATORY PANEL:  CBC  Recent Labs Lab 02/27/17 0425  WBC 27.2*  HGB 12.2  HCT 35.7  PLT 361    Chemistries   Recent Labs Lab 02/26/17 1620  02/27/17 0425 02/27/17 0804  NA 139  < > 136 133*  K 3.6  < > 3.7 3.5  CL 108  < > 110 110  CO2 19*  < > 17* 17*  GLUCOSE 319*  < > 312* 253*  BUN 23*  < > 23* 21*  CREATININE 1.16*  < > 0.97 0.98  CALCIUM 7.9*  < > 8.0* 7.9*  MG 2.8*  --  1.7  --   AST 573*  --   --   --   ALT 416*  --   --   --   < > = values in this interval not displayed. Cardiac Enzymes  Recent Labs Lab 02/25/17 2240  TROPONINI 0.03*   RADIOLOGY:  Dg Chest 1 View  Result Date: 02/26/2017 CLINICAL DATA:  Central line placement EXAM: CHEST 1 VIEW COMPARISON:  02/25/2017 CXR and CT FINDINGS: New left IJ approach central line catheter tip is seen at the cavoatrial junction. No pneumothorax. There is cardiomegaly without aortic aneurysm. Diffuse airspace opacities consistent with pulmonary edema is noted. Superimposed pneumonia would be difficult to entirely exclude. Satisfactory endotracheal and gastric tube positions with endotracheal tube tip 5.3 cm above the carina and gastric tube extending into the expected location the stomach. Cholecystectomy clips  are seen in the right upper quadrant. IMPRESSION: 1. Satisfactory support line and tube positions. 2. New left IJ approach central venous line is noted with tip at the cavoatrial junction. No pneumothorax. 3. Cardiomegaly with diffuse bilateral airspace opacities suspicious for pulmonary edema. Superimposed pneumonia would be difficult to entirely exclude. Electronically Signed   By: Tollie Eth M.D.   On: 02/26/2017 03:28   Ct Head Wo Contrast  Result Date: 02/25/2017 CLINICAL DATA:  Focal neuro deficit, greater than 6 hours. Stroke suspected. Unresponsive,  post CPR. EXAM: CT HEAD WITHOUT CONTRAST TECHNIQUE: Contiguous axial images were obtained from the base of the skull through the vertex without intravenous contrast. COMPARISON:  None. FINDINGS: Brain: No evidence of acute infarction, hemorrhage, hydrocephalus, extra-axial collection or mass lesion/mass effect. Gray-white differentiation is preserved, no cerebral edema. Vascular: Atherosclerosis of skullbase vasculature without hyperdense vessel or abnormal calcification. Skull: Normal. Negative for fracture or focal lesion. Sinuses/Orbits: Paranasal sinuses and mastoid air cells are clear. The visualized orbits are unremarkable. Other: None. IMPRESSION: No acute intracranial abnormality. Electronically Signed   By: Rubye Oaks M.D.   On: 02/25/2017 23:18   Dg Chest Port 1 View  Result Date: 02/27/2017 CLINICAL DATA:  Respiratory failure. EXAM: PORTABLE CHEST 1 VIEW COMPARISON:  02/26/2017. FINDINGS: Endotracheal tube, left IJ line, NG tube in stable position. Heart size stable. Diffuse bilateral airspace disease again noted. Slight interim improvement. No prominent pleural effusion or pneumothorax. IMPRESSION: 1. Lines and tubes in stable position. 2. Diffuse bilateral airspace disease again noted. Slight interim improvement. Electronically Signed   By: Maisie Fus  Register   On: 02/27/2017 06:23   Dg Chest Portable 1 View  Result Date: 02/25/2017 CLINICAL DATA:  Status post intubation. EXAM: PORTABLE CHEST 1 VIEW COMPARISON:  None. FINDINGS: The heart size and mediastinal contours are within normal limits. Endotracheal tube is seen projected over tracheal air shadow with distal tip 4 cm above the carina. Nasogastric tube is seen looped within proximal stomach. Bilateral lung opacities are noted concerning for edema or possibly inflammation. No definite pneumothorax or pleural effusion is noted. The visualized skeletal structures are unremarkable. IMPRESSION: Endotracheal and nasogastric tubes in grossly  good position. Mild bilateral lung opacities are noted concerning for edema or possibly inflammation. Electronically Signed   By: Lupita Raider, M.D.   On: 02/25/2017 23:01   Dg Abd Portable 1v  Result Date: 02/26/2017 CLINICAL DATA:  56 y/o  F; encounter for feeding tube placement. EXAM: PORTABLE ABDOMEN - 1 VIEW COMPARISON:  None. FINDINGS: Right hemiabdomen is excluded from field of view. Normal bowel gas pattern. Enteric tube tip projects over the distal stomach. Cholecystectomy clips. Bones are unremarkable. IMPRESSION: Enteric tube tip projects over distal stomach. Electronically Signed   By: Mitzi Hansen M.D.   On: 02/26/2017 14:39   Ct Angio Chest Aorta W And/or Wo Contrast  Result Date: 02/25/2017 CLINICAL DATA:  Unwitnessed fall, unresponsive. EXAM: CT ANGIOGRAPHY CHEST WITH CONTRAST TECHNIQUE: Multidetector CT imaging of the chest was performed using the standard protocol during bolus administration of intravenous contrast. Multiplanar CT image reconstructions and MIPs were obtained to evaluate the vascular anatomy. CONTRAST:  100 mL of Isovue 370 intravenously. COMPARISON:  Radiograph of same day. FINDINGS: Cardiovascular: Suboptimal opacification of pulmonary arteries is noted due to timing of contrast bolus as well as respiratory motion artifact. Large central pulmonary embolus is not identified in the main pulmonary artery or the main portions of the right or left pulmonary emboli. However, smaller peripheral pulmonary  emboli cannot be excluded on the basis of this exam. There is no evidence of thoracic aortic dissection or aneurysm. No pericardial effusion is noted. Mediastinum/Nodes: Endotracheal tube is in grossly good position. No mediastinal adenopathy is noted. Thyroid gland is unremarkable. Lungs/Pleura: No pneumothorax or pleural effusion is noted. Bilateral diffuse lung opacities are noted concerning for edema or pneumonia. Upper Abdomen: Nasogastric tube tip is seen in  distal stomach. Musculoskeletal: No chest wall abnormality. No acute or significant osseous findings. Review of the MIP images confirms the above findings. IMPRESSION: Suboptimal opacification of pulmonary arteries is noted due to timing of contrast bolus as well as respiratory motion artifact. Large central pulmonary embolus is not identified, but smaller peripheral emboli cannot be excluded on the basis of this exam. Bilateral lung opacities are noted concerning for edema or pneumonia. Endotracheal and nasogastric tubes appear to be in good position. Electronically Signed   By: Lupita Raider, M.D.   On: 02/25/2017 23:35   ASSESSMENT AND PLAN:   Ms. Vergel reportedly had diarrhea over the last 2-3 days for which she was taking up to 12 Imodium per day. She was trying to stay well hydrated and had also stopped taking her blood pressure medications on account of her GI illness. Around 9 PM, Ms. Derderian's husband found the patient seated on the toilet. She attempted to stand up but passed out.  * Cardiac arrest due to ventricular fibrillation -Inciting event is still somewhat unclear, though given recent diarrhea and significant electrolyte abnormalities  Vs suspect that she may have had a primary arrhythmogenic event. -Patient underwent urgent Catheterization last night showed no significant coronary artery disease. 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 -Patient now off IV amiodarone drip, Levophed  and Neo-Synephrine secondary to cardiogenic shock---she is currently on;y on epinephrine drip -Patient currently on hypothermia protocol---being rewarmed -Appreciate ICU attending help -CT chest negative for PE  *Hypokalemia being repeated  *Acute renal failure secondary to #1  *Metabolic acidosis secondary to #1  *Hyperglycemia -Patient  currently on insulin drip -Check A1c  *DVT prophylaxis subcutaneous heparin  Spoke with daughter in the room. Patient is critically ill. Family understands.  Case discussed with Care Management/Social Worker. Management plans discussed with the  family and they are in agreement.  CODE STATUS: Full  DVT Prophylaxis: Subcutaneous heparin  TOTAL CRITICAL TIME TAKING CARE OF THIS PATIENT: 30 minutes.  >50% time spent on counselling and coordination of care  Note: This dictation was prepared with Dragon dictation along with smaller phrase technology. Any transcriptional errors that result from this process are unintentional.  Azrielle Springsteen M.D on 02/27/2017 at 12:28 PM  Between 7am to 6pm - Pager - [phone]  After 6pm go to [email or web address] - Social research officer, government  [facility]  Office  [phone]  CC: Primary care physician; Merwyn Katos, MD

## 2017-02-27 NOTE — Plan of Care (Signed)
Problem: Fluid Volume: Goal: Ability to maintain a balanced intake and output will improve Outcome: Not Progressing Urine output less than 54ml/hr throughout shift; total UOP for shift . Will continue to assess and monitor closely.

## 2017-02-27 NOTE — Progress Notes (Signed)
Rewarming process began at 0732, verified with Adron Bene, RN. Will continue to monitor patient.

## 2017-02-27 NOTE — Progress Notes (Signed)
Pt's rr on vent was lowered from 26 down to 20 due to her ETCO2 being roughly 17-20.  Will continue to monitor Pt.

## 2017-02-27 NOTE — Progress Notes (Signed)
Inpatient Diabetes Program Recommendations  AACE/ADA: New Consensus Statement on Inpatient Glycemic Control (2015)  Target Ranges:  Prepandial:   less than 140 mg/dL      Peak postprandial:   less than 180 mg/dL (1-2 hours)      Critically ill patients:  140 - 180 mg/dL   Lab Results  Component Value Date   GLUCAP 240 (H) 02/27/2017    Review of Glycemic Control  Results for JAMILYNN, ROFFERS (MRN [id number]) as of 02/27/2017 08:00  Ref. Range 02/27/2017 02:51 02/27/2017 03:58 02/27/2017 05:02 02/27/2017 06:02 02/27/2017 07:09  Glucose-Capillary Latest Ref Range: 65 - 99 mg/dL 099 (H) 833 (H) 825 (H) 255 (H) 240 (H)     Diabetes history: none noted Outpatient Diabetes medications: none Current orders for Inpatient glycemic control: IV insulin - ICU Glycemic control order set  Inpatient Diabetes Program Recommendations: IV insulin discontinued for 30 minutes since drip rate was greater than 30units/hour- after 30 minutes, the multiplier was reset at the original multiplier of 0.01 and drip resumed.   Susette Racer, RN, BA, MHA, CDE Diabetes Coordinator Inpatient Diabetes Program  [phone] (Team Pager) [phone] [facility] Office) 02/27/2017 8:02 AM

## 2017-02-27 NOTE — Progress Notes (Signed)
CH made a follow up visit. Pt is intubated and asleep. Daughter is bedside. RN stated that they are beginning to warm the Pt, which will take around 8-10 hours. Daughter spoke of her admiration of her mother and her concern for her younger brother and father. Both have been dependent upon the Pt in many ways. 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 CH will follow up later in the day.    02/27/17 1100  Clinical Encounter Type  Visited With Patient;Patient and family together  Visit Type Follow-up;Spiritual support;Critical Care  Consult/Referral To Chaplain  Spiritual Encounters  Spiritual Needs Prayer;Emotional

## 2017-02-27 NOTE — Progress Notes (Signed)
PULMONARY / CRITICAL CARE MEDICINE   Name: Miranda [name] MRN: [id number] DOB: [birth year]    ADMISSION DATE:  02/25/2017   PT PROFILE: 33 F suffered OOH arrest with at least 20 mins ACLS/CPR. Initial identified rhythm was VF. Underwent defib X 3 before ROSC. LHC revealed no CAD, severely reduced LV function and moderately elevated LVEDP. TTM 33 degree protocol initiated.  MAJOR EVENTS/TEST RESULTS: 08/21 CT head: no acute findings 08/21 CTA chest: no PE. Diffuse bilateral opacities 08/22 LHC: no CAD, severely reduced LV function and moderately elevated LVEDP 08/22 Echocardiogram:  08/22 Refractory shock despite norepinephrine and phenylephrine infusions. Epinephrine infusion initiated 08/23 Rewarming initiated. Sinus bradyarrhythmias. Amiodarone DC'd. Milrinone initiated. Improved gas exchange. Off of phenylephrine and norepi. Weaning of of epinephrine  INDWELLING DEVICES:: ETT 08/21 >>  L IJ CVL 08/22 >>  R femoral A-line 08/22 >>   MICRO DATA: MRSA PCR 08/22 >> NEG Blood 08/22 >>   ANTIMICROBIALS:     SUBJECTIVE:  Sedated. Intubated. Paralyzed  VITAL SIGNS: BP (!) 106/45   Pulse (!) 49   Temp (!) 89.5 F (31.9 C)   Resp 20   Ht 5\' 2"  (1.575 m)   Wt 195 lb 8.8 oz (88.7 kg)   SpO2 97%   BMI 35.77 kg/m   HEMODYNAMICS:    VENTILATOR SETTINGS: Vent Mode: PRVC FiO2 (%):  [30 %-100 %] 30 % Set Rate:  [20 bmp-26 bmp] 20 bmp Vt Set:  [450 mL] 450 mL PEEP:  [5 cmH20-7 cmH20] 5 cmH20  INTAKE / OUTPUT: I/O last 3 completed shifts: In: 5749.7 [I.V.:4549.7; IV Piggyback:1200] Out: 1055 [Urine:1055]  PHYSICAL EXAMINATION: General:  Intubated, sedated, paralyzed Neuro:  PERRL, cannot assess rest of exam due to NMBs HEENT:  AT,Chalmers, Pupis 3 non reactive Cardiovascular:  Irreg, brady, no M  RHythm is sinus brady with junctional escape beats and PACs Lungs: Clear anteriorly Abdomen: soft, diminished to absent BS Ext: no edema  LABS:  BMET  Recent Labs Lab  02/27/17 0025 02/27/17 0425 02/27/17 0804  NA 136 136 133*  K 3.6 3.7 3.5  CL 111 110 110  CO2 15* 17* 17*  BUN 22* 23* 21*  CREATININE 0.99 0.97 0.98  GLUCOSE 358* 312* 253*    Electrolytes  Recent Labs Lab 02/26/17 0340  02/26/17 1235 02/26/17 1620  02/27/17 0025 02/27/17 0425 02/27/17 0804  CALCIUM 8.4*  < > 7.9* 7.9*  < > 7.9* 8.0* 7.9*  MG 1.8  --  2.2 2.8*  --   --  1.7  --   PHOS 3.6  --   --   --   --   --   --   --   < > = values in this interval not displayed.  CBC  Recent Labs Lab 02/26/17 0214 02/26/17 0340 02/27/17 0425  WBC 29.4* 26.1* 27.2*  HGB 12.1 12.7 12.2  HCT 35.5 36.8 35.7  PLT 448* 460* 361    Coag's  Recent Labs Lab 02/26/17 0340 02/26/17 0612 02/26/17 1237  APTT 30 33 34  INR 1.18 1.29 1.34    Sepsis Markers  Recent Labs Lab 02/25/17 2240 02/26/17 0210  LATICACIDVEN 7.2* 5.2*    ABG  Recent Labs Lab 02/26/17 0026 02/26/17 0205 02/26/17 1227  PHART 7.33* 7.29* 7.32*  PCO2ART 36 39 35  PO2ART 59* 92 127*    Liver Enzymes  Recent Labs Lab 02/26/17 1620  AST 573*  ALT 416*    Cardiac Enzymes  Recent Labs Lab 02/25/17 2240  TROPONINI 0.03*    Glucose  Recent Labs Lab 02/27/17 0502 02/27/17 0602 02/27/17 0709 02/27/17 0801 02/27/17 0906 02/27/17 1005  GLUCAP 238* 255* 240* 233* 223* 188*    CXR: somewhat improved edema pattern    ASSESSMENT / PLAN:  PULMONARY A: Acute hypoxemic respiratory failure after cardiac arrest Pulmonary edema P:   Cont vent support - settings reviewed and/or adjusted Cont vent bundle Daily SBT if/when meets criteria  CARDIOVASCULAR A:  Cardiac arrest - initial rhythm VF Sinus bradycardia with junctional escape and PACs Hx of Hypertension P:  Cardiology following MAP goal changed to > 65 mmHg Wean epi as able Amiodarone DC'd 08/23 Milrinone initiated 08/23 Might require temporary pacemaker placement  RENAL A:   AKI Hypokalemia P:   Monitor  BMET intermittently Monitor I/Os Correct electrolytes as indicated  GASTROINTESTINAL A:   Recent diarrheal illness P:   SUP: IV famotidine Consider TF protocol 08/24  HEMATOLOGIC A:   No acute Issues P:  DVT px: SQ heparin Monitor CBC intermittently Transfuse per usual guidelines  INFECTIOUS A:   Leukocytosis - likely stress/epinephrine response P:   Monitor temp, WBC count Micro and abx as above   ENDOCRINE A:   Stress induced hyperglycemia  P:   Cont insulin gtt per protocol  NEUROLOGIC A:   Anoxic encephalopathy ICU/vent associated discomfort P:   RASS goal: N/A while on NMBs.  RASS goal after DC NMB: -1, -2 Rewarm per protocol After rewarmed, DC continuous sedative infusions    FAMILY: husband updated @ bedside   CCM time 45 mins [name], MD PCCM service Mobile [phone] Pager [phone] 02/27/2017 11:27 AM

## 2017-02-28 ENCOUNTER — Inpatient Hospital Stay: Payer: BC Managed Care – PPO

## 2017-02-28 LAB — LACTIC ACID, PLASMA
LACTIC ACID, VENOUS: 2.9 mmol/L — AB (ref 0.5–1.9)
LACTIC ACID, VENOUS: 2.6 mmol/L — AB (ref 0.5–1.9)
LACTIC ACID, VENOUS: 2 mmol/L — AB (ref 0.5–1.9)

## 2017-02-28 LAB — BASIC METABOLIC PANEL
ANION GAP: 9 (ref 5–15)
ANION GAP: 10 (ref 5–15)
BUN: 22 mg/dL — AB (ref 6–20)
BUN: 20 mg/dL (ref 6–20)
CALCIUM: 8.3 mg/dL — AB (ref 8.9–10.3)
CHLORIDE: 105 mmol/L (ref 101–111)
CO2: 16 mmol/L — ABNORMAL LOW (ref 22–32)
CO2: 18 mmol/L — ABNORMAL LOW (ref 22–32)
CREATININE: 1.03 mg/dL — AB (ref 0.44–1.00)
Calcium: 8.2 mg/dL — ABNORMAL LOW (ref 8.9–10.3)
Chloride: 105 mmol/L (ref 101–111)
Creatinine, Ser: 1.17 mg/dL — ABNORMAL HIGH (ref 0.44–1.00)
GFR calc Af Amer: >60 mL/min (ref >60)
GFR, EST AFRICAN AMERICAN: 58 mL/min — AB (ref >60)
GFR, EST NON AFRICAN AMERICAN: 50 mL/min — AB (ref >60)
GFR, EST NON AFRICAN AMERICAN: 59 mL/min — AB (ref >60)
GLUCOSE: 204 mg/dL — AB (ref 65–99)
Glucose, Bld: 214 mg/dL — ABNORMAL HIGH (ref 65–99)
POTASSIUM: 4.4 mmol/L (ref 3.5–5.1)
Potassium: 4.8 mmol/L (ref 3.5–5.1)
SODIUM: 130 mmol/L — AB (ref 135–145)
Sodium: 133 mmol/L — ABNORMAL LOW (ref 135–145)

## 2017-02-28 LAB — GLUCOSE, CAPILLARY
GLUCOSE-CAPILLARY: 211 mg/dL — AB (ref 65–99)
GLUCOSE-CAPILLARY: 114 mg/dL — AB (ref 65–99)
Glucose-Capillary: 146 mg/dL — ABNORMAL HIGH (ref 65–99)
Glucose-Capillary: 152 mg/dL — ABNORMAL HIGH (ref 65–99)
Glucose-Capillary: 182 mg/dL — ABNORMAL HIGH (ref 65–99)
Glucose-Capillary: 215 mg/dL — ABNORMAL HIGH (ref 65–99)

## 2017-02-28 LAB — PROCALCITONIN: Procalcitonin: 2.79 ng/mL

## 2017-02-28 LAB — POTASSIUM
POTASSIUM: 5.2 mmol/L — AB (ref 3.5–5.1)
Potassium: 5.4 mmol/L — ABNORMAL HIGH (ref 3.5–5.1)

## 2017-02-28 LAB — CBC
HEMATOCRIT: 31.6 % — AB (ref 35.0–47.0)
HEMOGLOBIN: 11 g/dL — AB (ref 12.0–16.0)
MCH: 30.4 pg (ref 26.0–34.0)
MCHC: 34.8 g/dL (ref 32.0–36.0)
MCV: 87.3 fL (ref 80.0–100.0)
Platelets: 376 10*3/uL (ref 150–440)
RBC: 3.61 MIL/uL — AB (ref 3.80–5.20)
RDW: 12.8 % (ref 11.5–14.5)
WBC: 47.4 10*3/uL — AB (ref 3.6–11.0)

## 2017-02-28 LAB — MAGNESIUM
MAGNESIUM: 1.2 mg/dL — AB (ref 1.7–2.4)
MAGNESIUM: 2.7 mg/dL — AB (ref 1.7–2.4)

## 2017-02-28 MED ORDER — DOCUSATE SODIUM 50 MG/5ML PO LIQD
100.0000 mg | Freq: Two times a day (BID) | ORAL | Status: DC
  Administered 2017-02-28 – 2017-03-02 (×6): 100 mg
  Filled 2017-02-28 (×6): qty 10

## 2017-02-28 MED ORDER — VANCOMYCIN HCL IN DEXTROSE 1-5 GM/200ML-% IV SOLN
1000.0000 mg | Freq: Once | INTRAVENOUS | Status: AC
  Administered 2017-02-28: 1000 mg via INTRAVENOUS
  Filled 2017-02-28: qty 200

## 2017-02-28 MED ORDER — FENTANYL 2500MCG IN NS 250ML (10MCG/ML) PREMIX INFUSION
50.0000 ug/h | INTRAVENOUS | Status: DC
  Administered 2017-02-28: 50 ug/h via INTRAVENOUS
  Administered 2017-03-01 (×2): 80 ug/h via INTRAVENOUS
  Administered 2017-03-02: 100 ug/h via INTRAVENOUS
  Administered 2017-03-03: 75 ug/h via INTRAVENOUS
  Filled 2017-02-28 (×4): qty 250

## 2017-02-28 MED ORDER — VITAL HIGH PROTEIN PO LIQD
1000.0000 mL | ORAL | Status: DC
  Administered 2017-02-28 (×3)
  Administered 2017-02-28: 1000 mL
  Administered 2017-02-28 (×2)
  Administered 2017-03-01: 1000 mL
  Administered 2017-03-01 (×3)
  Administered 2017-03-02: 1000 mL

## 2017-02-28 MED ORDER — PIPERACILLIN-TAZOBACTAM 3.375 G IVPB
3.3750 g | Freq: Three times a day (TID) | INTRAVENOUS | Status: DC
  Administered 2017-02-28 – 2017-03-03 (×10): 3.375 g via INTRAVENOUS
  Filled 2017-02-28 (×10): qty 50

## 2017-02-28 MED ORDER — SENNOSIDES 8.8 MG/5ML PO SYRP
5.0000 mL | ORAL_SOLUTION | Freq: Two times a day (BID) | ORAL | Status: DC
Start: 2017-02-28 — End: 2017-03-03
  Administered 2017-02-28 – 2017-03-02 (×6): 5 mL
  Filled 2017-02-28 (×9): qty 5

## 2017-02-28 MED ORDER — MAGNESIUM SULFATE 4 GM/100ML IV SOLN
4.0000 g | Freq: Once | INTRAVENOUS | Status: AC
  Administered 2017-02-28: 4 g via INTRAVENOUS
  Filled 2017-02-28: qty 100

## 2017-02-28 MED ORDER — EPINEPHRINE PF 1 MG/ML IJ SOLN
0.5000 ug/min | INTRAVENOUS | Status: DC
  Administered 2017-02-28: 20 ug/min via INTRAVENOUS
  Administered 2017-02-28: 4 ug/min via INTRAVENOUS
  Filled 2017-02-28 (×2): qty 4

## 2017-02-28 MED ORDER — VANCOMYCIN HCL IN DEXTROSE 1-5 GM/200ML-% IV SOLN
1000.0000 mg | INTRAVENOUS | Status: DC
  Administered 2017-02-28 – 2017-03-03 (×4): 1000 mg via INTRAVENOUS
  Filled 2017-02-28 (×5): qty 200

## 2017-02-28 NOTE — Progress Notes (Signed)
Pt was placed into the pressure control mode with an IP of 25.

## 2017-02-28 NOTE — Progress Notes (Signed)
MEDICATION RELATED CONSULT NOTE  Pharmacy Consult for Electrolyte Monitoring Indication: Hypomagnesemia  Allergies  Allergen Reactions  . Ace Inhibitors   . Compazine [Prochlorperazine Edisylate]   . Lisinopril   . Penicillins     Has patient had a PCN reaction causing immediate rash, facial/tongue/throat swelling, SOB or lightheadedness with hypotension: Unknown Has patient had a PCN reaction causing severe rash involving mucus membranes or skin necrosis: Unknown Has patient had a PCN reaction that required hospitalization: Unknown Has patient had a PCN reaction occurring within the last 10 years: Unknown If all of the above answers are "NO", then may proceed with Cephalosporin use.     Patient Measurements: Height: 5\' 2"  (157.5 cm) Weight: 195 lb 8.8 oz (88.7 kg) IBW/kg (Calculated) : 50.1  Vital Signs: Temp: 95.2 F (35.1 C) (08/24 0800) Temp Source: Core (Comment) (08/24 0700) BP: 113/53 (08/24 1300) Pulse Rate: 68 (08/24 1330) Intake/Output from previous day: 08/23 0701 - 08/24 0700 In: 2355.6 [I.V.:1955.6; IV Piggyback:400] Out: 640 [Urine:410; Emesis/NG output:230] Intake/Output from this shift: Total I/O In: 511.4 [I.V.:485.4; NG/GT:26] Out: 950 [Urine:950]  Labs:  Recent Labs  02/26/17 0340 02/26/17 0612  02/26/17 1237 02/26/17 1620  02/27/17 0425  02/27/17 1619 02/28/17 0431 02/28/17 1026  WBC 26.1*  --   --   --   --   --  27.2*  --   --  47.4*  --   HGB 12.7  --   --   --   --   --  12.2  --   --  11.0*  --   HCT 36.8  --   --   --   --   --  35.7  --   --  31.6*  --   PLT 460*  --   --   --   --   --  361  --   --  376  --   APTT 30 33  --  34  --   --   --   --   --   --   --   CREATININE 1.08* 1.05*  < >  --  1.16*  < > 0.97  < > 1.09* 1.17* 1.03*  MG 1.8  --   < >  --  2.8*  --  1.7  --   --   --  1.2*  PHOS 3.6  --   --   --   --   --   --   --   --   --   --   AST  --   --   --   --  573*  --   --   --   --   --   --   ALT  --   --   --    --  416*  --   --   --   --   --   --   < > = values in this interval not displayed. Estimated Creatinine Clearance: 61.6 mL/min (A) (by C-G formula based on SCr of 1.03 mg/dL (H)).  Sodium (mmol/L)  Date Value  02/28/2017 133 (L)  08/16/2016 141   Potassium (mmol/L)  Date Value  02/28/2017 4.8   Calcium (mg/dL)  Date Value  97/35/3299 8.3 (L)    Assessment: 56 y/o F s/p cardiac arrest and hypothermia protocol with hypomagnesemia. Due to high risk of recurrent arrhythmia, cardiology wants a goal K of > or = 4 and Mg >  or = 2.  Plan:  Will order magnesium 4 g iv once. Will recheck magnesium and potassium at 1800.   Luisa Hart D 02/28/2017,1:50 PM

## 2017-02-28 NOTE — Progress Notes (Signed)
Nutrition Follow-up  DOCUMENTATION CODES:   Obesity unspecified  INTERVENTION:  Received verbal order to begin trickle feeds, initiate vital high protein via OGT at 63mL/hr  Goal rate 28mL/hr, provides 1200 calories, 105 grams protein, H2O  NUTRITION DIAGNOSIS:   Inadequate oral intake related to inability to eat (pt ventilated and sedated) as evidenced by NPO status. -ongoing  GOAL:   Provide needs based on ASPEN/SCCM guidelines -not meeting currently  MONITOR:   Vent status, Labs, Weight trends, TF tolerance, I & O's  REASON FOR ASSESSMENT:   Ventilator    ASSESSMENT:   56 year old female with PMHx of HTN, migraines, edema to bilateral legs. Patient had diarrhea for 2-3 days PTA and was taking up to 12 imodium per day, staying well-hydrated, and stopped taking antihypertensives. She suffered V-fib arrest at home, was intubated in the field after ROSC.  Patient is currently intubated on ventilator support MV: 13.6 L/min Temp (24hrs), Avg:96.2 F (35.7 C), Min:89.8 F (32.1 C), Max:98.8 F (37.1 C) Propofol: None  410cc UOP last 24 hrs, 6L Fluid positive Rewarmed - holding at 36.9 during visit. OGT tip in stomach per abd x-ray 8/22  Labs reviewed:  CBGs 215, 211, 152 Na 133, WBC 47.4 Map: 67  Medications reviewed and include:  Novolog 2-6 units Q4H Lantus 10 Units Precedex gtt, Fentanyl gtt Epinephrine gtt, Milrinone gtt, Levo gtt  Diet Order:     Skin:  Reviewed, no issues  Last BM:  Unknown  Height:   Ht Readings from Last 1 Encounters:  02/26/17 5\' 2"  (1.575 m)    Weight:   Wt Readings from Last 1 Encounters:  02/26/17 195 lb 8.8 oz (88.7 kg)    Ideal Body Weight:  50 kg  BMI:  Body mass index is 35.77 kg/m.  Estimated Nutritional Needs:   Kcal:  [phone] (11-14 kcal/kg)  Protein:  >/= 100 grams (>/= 2 grams/kg IBW)  Fluid:  1.5-1.75 L/day (30-35 ml/kg IBW)  EDUCATION NEEDS:   No education needs identified at this  time  Dionne Ano. [place] Brogden, MS, RD LDN Inpatient Clinical Dietitian Pager [phone]

## 2017-02-28 NOTE — Progress Notes (Signed)
Chaplain made a follow up visit with pt. Miranda [name] met pt, no family was not present. RN and other health provider where in the Rm. CH offered silent prayer and a ministry of presence.    02/28/17 1100  Clinical Encounter Type  Visited With Patient;Health care provider  Visit Type Follow-up;Spiritual support;Other (Comment)  Referral From Chaplain  Consult/Referral To Chaplain  Spiritual Encounters  Spiritual Needs Prayer;Emotional

## 2017-02-28 NOTE — Progress Notes (Signed)
[facility] [place] [facility]   PATIENT NAME: Miranda [name]    MR#:  [id number]  DATE OF BIRTH:  1961/03/28  SUBJECTIVE:   Patient came in with cardiac arrest at home. She is currently intubated on the ventilator. husband in the room. Patient is  rewarmed REVIEW OF SYSTEMS:   Review of Systems  Unable to perform ROS: Intubated    DRUG ALLERGIES:   Allergies  Allergen Reactions  . Ace Inhibitors   . Compazine [Prochlorperazine Edisylate]   . Lisinopril   . Penicillins     Has patient had a PCN reaction causing immediate rash, facial/tongue/throat swelling, SOB or lightheadedness with hypotension: Unknown Has patient had a PCN reaction causing severe rash involving mucus membranes or skin necrosis: Unknown Has patient had a PCN reaction that required hospitalization: Unknown Has patient had a PCN reaction occurring within the last 10 years: Unknown If all of the above answers are "NO", then may proceed with Cephalosporin use.     VITALS:  Blood pressure (!) 108/50, pulse 60, temperature 98.6 F (37 C), resp. rate 20, height 5\' 2"  (1.575 m), weight 88.7 kg (195 lb 8.8 oz), SpO2 98 %.  PHYSICAL EXAMINATION:   Physical Exam  GENERAL:  56 y.o.-year-old patient lying in the bed with no acute distress. Critically ill EYES: Pupils equal, round, reactive to light and accommodation. No scleral icterus. Extraocular muscles intact.  HEENT: Head atraumatic, normocephalic. Oropharynx and nasopharynx clear. Intubated NECK:  Supple, no jugular venous distention. No thyroid enlargement, no tenderness, NG tube.  LUNGS: Normal breath sounds bilaterally, no wheezing, rales, rhonchi. No use of accessory muscles of respiration.  CARDIOVASCULAR: S1, S2 normal. No murmurs, rubs, or gallops.  ABDOMEN: Soft, nontender, nondistended. Bowel sounds feeble. No organomegaly or mass.  EXTREMITIES: No cyanosis, clubbing or edema b/l.    NEUROLOGIC: Intubated on the  ventilator PSYCHIATRIC:  Sedated and intubated on the ventilator  SKIN: No obvious rash, lesion, or ulcer.   LABORATORY PANEL:  CBC  Recent Labs Lab 02/28/17 0431  WBC 47.4*  HGB 11.0*  HCT 31.6*  PLT 376    Chemistries   Recent Labs Lab 02/26/17 1620  02/28/17 1026  NA 139  < > 133*  K 3.6  < > 4.8  CL 108  < > 105  CO2 19*  < > 18*  GLUCOSE 319*  < > 204*  BUN 23*  < > 20  CREATININE 1.16*  < > 1.03*  CALCIUM 7.9*  < > 8.3*  MG 2.8*  < > 1.2*  AST 573*  --   --   ALT 416*  --   --   < > = values in this interval not displayed. Cardiac Enzymes  Recent Labs Lab 02/25/17 2240  TROPONINI 0.03*   RADIOLOGY:  Dg Chest Port 1 View  Result Date: 02/28/2017 CLINICAL DATA:  Respiratory failure. EXAM: PORTABLE CHEST 1 VIEW COMPARISON:  09/2016 FINDINGS: Endotracheal tube, left IJ line, NG tube in stable position. Heart size stable. Persistent bilateral airspace disease again noted without significant change. No prominent pleural effusion. No pneumothorax. IMPRESSION: 1. Lines and tubes in stable position. 2. Persistent bilateral diffuse airspace disease without significant change. Electronically Signed   By: Maisie Fus  Register   On: 02/28/2017 06:21   Dg Chest Port 1 View  Result Date: 02/27/2017 CLINICAL DATA:  Respiratory failure. EXAM: PORTABLE CHEST 1 VIEW COMPARISON:  02/26/2017. FINDINGS: Endotracheal tube, left IJ line, NG tube in stable position.  Heart size stable. Diffuse bilateral airspace disease again noted. Slight interim improvement. No prominent pleural effusion or pneumothorax. IMPRESSION: 1. Lines and tubes in stable position. 2. Diffuse bilateral airspace disease again noted. Slight interim improvement. Electronically Signed   By: Maisie Fus  Register   On: 02/27/2017 06:23   ASSESSMENT AND PLAN:   Ms. Medlock reportedly had diarrhea over the last 2-3 days for which she was taking up to 12 Imodium per day. She was trying to stay well hydrated and had also stopped  taking her blood pressure medications on account of her GI illness. Around 9 PM, Ms. Neth's husband found the patient seated on the toilet. She attempted to stand up but passed out.  * Cardiac arrest due to ventricular fibrillation -Inciting event is still somewhat unclear, though given recent diarrhea and significant electrolyte abnormalities  Vs suspect that she may have had a primary arrhythmogenic event. -Patient underwent urgent Catheterization last night showed no significant coronary artery disease. 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 -Patient now off IV amiodarone drip, and Neo-Synephrine secondary to cardiogenic shock---she is currently being tapered off IV epinephrine drip -On IV levophed,Milrinone,fentanyl, [recedex gtt -Patient was  on hypothermia protocol -Appreciate ICU attending help -CT chest negative for PE  *Hypokalemia being repeated  *Acute renal failure secondary to #1 -making good urine  *Metabolic acidosis secondary to #1  *Hyperglycemia -Patient currently on insulin drip  * Nutrition -started on Tube feeding  *DVT prophylaxis subcutaneous heparin  Spoke with husband in the room. Patient is critically ill. Family understands.  Case discussed with Care Management/Social Worker. Management plans discussed with the  family and they are in agreement.  CODE STATUS: Full  DVT Prophylaxis: Subcutaneous heparin  TOTAL CRITICAL TIME TAKING CARE OF THIS PATIENT: 30 minutes.  >50% time spent on counselling and coordination of care  Note: This dictation was prepared with Dragon dictation along with smaller phrase technology. Any transcriptional errors that result from this process are unintentional.  Velta Rockholt M.D on 02/28/2017 at 4:59 PM  Between 7am to 6pm - Pager - [phone]  After 6pm go to  [email or web address] - Social research officer, government  [facility]  Office  [phone]  CC: Primary care physician; Merwyn Katos, MD

## 2017-02-28 NOTE — Progress Notes (Signed)
MEDICATION RELATED CONSULT NOTE  Pharmacy Consult for Electrolyte Monitoring Indication: Hypomagnesemia  Allergies  Allergen Reactions  . Ace Inhibitors   . Compazine [Prochlorperazine Edisylate]   . Lisinopril   . Penicillins     Has patient had a PCN reaction causing immediate rash, facial/tongue/throat swelling, SOB or lightheadedness with hypotension: Unknown Has patient had a PCN reaction causing severe rash involving mucus membranes or skin necrosis: Unknown Has patient had a PCN reaction that required hospitalization: Unknown Has patient had a PCN reaction occurring within the last 10 years: Unknown If all of the above answers are "NO", then may proceed with Cephalosporin use.     Patient Measurements: Height: 5\' 2"  (157.5 cm) Weight: 195 lb 8.8 oz (88.7 kg) IBW/kg (Calculated) : 50.1  Vital Signs: Temp: 98.6 F (37 C) (08/24 1800) Temp Source: Core (Comment) (08/24 0700) BP: 106/60 (08/24 1800) Pulse Rate: 54 (08/24 1800) Intake/Output from previous day: 08/23 0701 - 08/24 0700 In: 2355.6 [I.V.:1955.6; IV Piggyback:400] Out: 640 [Urine:410; Emesis/NG output:230] Intake/Output from this shift: Total I/O In: 982.3 [I.V.:801.3; NG/GT:126; IV Piggyback:55] Out: 1635 [Urine:1635]  Labs:  Recent Labs  02/26/17 0340 02/26/17 0612  02/26/17 1237 02/26/17 1620  02/27/17 0425  02/27/17 1619 02/28/17 0431 02/28/17 1026 02/28/17 1754  WBC 26.1*  --   --   --   --   --  27.2*  --   --  47.4*  --   --   HGB 12.7  --   --   --   --   --  12.2  --   --  11.0*  --   --   HCT 36.8  --   --   --   --   --  35.7  --   --  31.6*  --   --   PLT 460*  --   --   --   --   --  361  --   --  376  --   --   APTT 30 33  --  34  --   --   --   --   --   --   --   --   CREATININE 1.08* 1.05*  < >  --  1.16*  < > 0.97  < > 1.09* 1.17* 1.03*  --   MG 1.8  --   < >  --  2.8*  --  1.7  --   --   --  1.2* 2.7*  PHOS 3.6  --   --   --   --   --   --   --   --   --   --   --   AST  --    --   --   --  573*  --   --   --   --   --   --   --   ALT  --   --   --   --  416*  --   --   --   --   --   --   --   < > = values in this interval not displayed. Estimated Creatinine Clearance: 61.6 mL/min (A) (by C-G formula based on SCr of 1.03 mg/dL (H)).  Sodium (mmol/L)  Date Value  02/28/2017 133 (L)  08/16/2016 141   Potassium (mmol/L)  Date Value  02/28/2017 5.2 (H)   Calcium (mg/dL)  Date Value  16/04/9603 8.3 (L)  Assessment: 56 y/o F s/p cardiac arrest and hypothermia protocol with hypomagnesemia. Due to high risk of recurrent arrhythmia, cardiology wants a goal K of > or = 4 and Mg > or = 2.  Plan:  K = 5.2, Mg = 2.7 this evening. No supplementation needed at this time.  Will recheck K at 2300 tonight to ensure it is not increasing Will recheck electrolytes with AM labs tomorrow morning.  Cindi Carbon, PharmD Clinical Pharmacist 02/28/2017,6:47 PM

## 2017-02-28 NOTE — Progress Notes (Signed)
Patient Name: Miranda [name] Date of Encounter: 02/28/2017  Primary Cardiologist: [facility] Problem List     Principal Problem:   Cardiac arrest with ventricular fibrillation [facility]) Active Problems:   Cardiac arrest ([place])   Acute respiratory failure ([place])   Hypokalemia   Encounter for central line placement   Cardiogenic shock ([place])   Acute pulmonary edema ([facility])   Anoxic encephalopathy ([place])   Bradycardia     Subjective   Remains intubated and sedated. Sedation was held this morning and she was responsive. She became agitated.she is still on milrinone, norepinephrine and epinephrine drips.  Inpatient Medications    Scheduled Meds: . chlorhexidine gluconate (MEDLINE KIT)  15 mL Mouth Rinse BID  . heparin  5,000 Units Subcutaneous Q8H  . insulin aspart  2-6 Units Subcutaneous Q4H  . insulin glargine  10 Units Subcutaneous Q24H  . mouth rinse  15 mL Mouth Rinse 10 times per day  . sodium chloride flush  10-40 mL Intracatheter Q12H   Continuous Infusions: . dexmedetomidine (PRECEDEX) IV infusion 1.2 mcg/kg/hr (02/28/17 0850)  . dextrose    . epinephrine 13.5 mcg/min (02/28/17 0900)  . famotidine (PEPCID) IV Stopped (02/27/17 2230)  . fentaNYL infusion INTRAVENOUS 12.5 mcg/hr (02/28/17 0850)  . milrinone 0.5 mcg/kg/min (02/28/17 0840)  . norepinephrine (LEVOPHED) Adult infusion 8 mcg/min (02/28/17 0840)  . piperacillin-tazobactam (ZOSYN)  IV 3.375 g (02/28/17 0620)  . vancomycin     PRN Meds: atropine, dextrose, LORazepam, pneumococcal 23 valent vaccine, sodium chloride flush   Vital Signs    Vitals:   02/28/17 0815 02/28/17 0830 02/28/17 0845 02/28/17 0930  BP:  (!) 145/61  (!) 117/57  Pulse: 90 88 100 93  Resp: (!) 34 (!) 38 (!) 21 20  Temp:      TempSrc:      SpO2: 100% 98% 100% 100%  Weight:      Height:        Intake/Output Summary (Last 24 hours) at 02/28/17 0939 Last data filed at 02/28/17 0900  Gross per 24 hour  Intake          2410.36 ml   Output              890 ml  Net          1520.36 ml   Filed Weights   02/25/17 2304 02/26/17 0226  Weight: 195 lb 8.8 oz (88.7 kg) 195 lb 8.8 oz (88.7 kg)    Physical Exam    GEN: Critically ill appearing, in no acute distress.  HEENT: Grossly normal.  Neck: Supple, no JVD, carotid bruits, or masses. Cardiac: Irregular, no murmurs, rubs, or gallops. No clubbing, cyanosis, edema.  Radials/DP/PT 2+ and equal bilaterally.  Respiratory:  Diminished breath sounds bilaterally. Intubated.  GI: Soft, nontender, nondistended, BS + x 4. MS: No deformity or atrophy. Skin: Cool and dry, no rash. Neuro:  Intubated and sedated. Psych: Intubated and sedated.  Labs    CBC  Recent Labs  02/25/17 2240  02/27/17 0425 02/28/17 0431  WBC 19.2*  < > 27.2* 47.4*  NEUTROABS 7.8*  --   --   --   HGB 13.4  < > 12.2 11.0*  HCT 39.1  < > 35.7 31.6*  MCV 89.5  < > 89.0 87.3  PLT 511*  < > 361 376  < > = values in this interval not displayed. Basic Metabolic Panel  Recent Labs  02/26/17 0340  02/26/17 1620  02/27/17 0425  02/27/17 1619 02/28/17 0431  NA 140  < > 139  < > 136  < > 134* 130*  K 2.3*  < > 3.6  < > 3.7  < > 4.1 4.4  CL 104  < > 108  < > 110  < > 109 105  CO2 23  < > 19*  < > 17*  < > 19* 16*  GLUCOSE 277*  < > 319*  < > 312*  < > 135* 214*  BUN 21*  < > 23*  < > 23*  < > 22* 22*  CREATININE 1.08*  < > 1.16*  < > 0.97  < > 1.09* 1.17*  CALCIUM 8.4*  < > 7.9*  < > 8.0*  < > 8.1* 8.2*  MG 1.8  < > 2.8*  --  1.7  --   --   --   PHOS 3.6  --   --   --   --   --   --   --   < > = values in this interval not displayed. Liver Function Tests  Recent Labs  02/26/17 1620  AST 573*  ALT 416*   No results for input(s): LIPASE, AMYLASE in the last 72 hours. Cardiac Enzymes  Recent Labs  02/25/17 2240  TROPONINI 0.03*   BNP Invalid input(s): POCBNP D-Dimer No results for input(s): DDIMER in the last 72 hours. Hemoglobin A1C No results for input(s): HGBA1C in the last  72 hours. Fasting Lipid Panel  Recent Labs  02/26/17 1235  TRIG 161*   Thyroid Function Tests No results for input(s): TSH, T4TOTAL, T3FREE, THYROIDAB in the last 72 hours.  Invalid input(s): FREET3  Telemetry    Sinus rhythm with PACs - Personally Reviewed  ECG    n/a - Personally Reviewed  Radiology    Dg Chest Port 1 View  Result Date: 02/28/2017 CLINICAL DATA:  Respiratory failure. EXAM: PORTABLE CHEST 1 VIEW COMPARISON:  09/2016 FINDINGS: Endotracheal tube, left IJ line, NG tube in stable position. Heart size stable. Persistent bilateral airspace disease again noted without significant change. No prominent pleural effusion. No pneumothorax. IMPRESSION: 1. Lines and tubes in stable position. 2. Persistent bilateral diffuse airspace disease without significant change. Electronically Signed   By: Marcello Moores  Register   On: 02/28/2017 06:21   Dg Chest Port 1 View  Result Date: 02/27/2017 CLINICAL DATA:  Respiratory failure. EXAM: PORTABLE CHEST 1 VIEW COMPARISON:  02/26/2017. FINDINGS: Endotracheal tube, left IJ line, NG tube in stable position. Heart size stable. Diffuse bilateral airspace disease again noted. Slight interim improvement. No prominent pleural effusion or pneumothorax. IMPRESSION: 1. Lines and tubes in stable position. 2. Diffuse bilateral airspace disease again noted. Slight interim improvement. Electronically Signed   By: Marcello Moores  Register   On: 02/27/2017 06:23   Dg Abd Portable 1v  Result Date: 02/26/2017 CLINICAL DATA:  56 y/o  F; encounter for feeding tube placement. EXAM: PORTABLE ABDOMEN - 1 VIEW COMPARISON:  None. FINDINGS: Right hemiabdomen is excluded from field of view. Normal bowel gas pattern. Enteric tube tip projects over the distal stomach. Cholecystectomy clips. Bones are unremarkable. IMPRESSION: Enteric tube tip projects over distal stomach. Electronically Signed   By: Kristine Garbe M.D.   On: 02/26/2017 14:39    Cardiac Studies   LHC  02/26/2017: Conclusion   Conclusions: 1. No angiographically significant coronary artery disease. 2. Severely reduced left ventricular contraction (<25%) with moderately elevated left ventricular filling pressure (LVEDP 28 mmHg).  Recommendations: 1.  Hypothermia protocol per hospitalist and ICU teams. 2. Check magnesium level; replete electrolytes for goal potassium and magnesium greater than 4.0 and 2.0, respectively. 3. Consider gentle diuresis, given elevated LVEDP and severely reduced LVEF. 4. Obtain transthoracic echocardiogram    TTE 02/26/2017: Study Conclusions  - Left ventricle: The cavity size was mildly dilated. Systolic   function was severely reduced. The estimated ejection fraction   was <20%. Diffuse hypokinesis. Regional wall motion abnormalities   cannot be excluded. The study is not technically sufficient to   allow evaluation of LV diastolic function. - Mitral valve: There was mild regurgitation. - Right ventricle: Systolic function was mildly reduced. - Pulmonary arteries: Systolic pressure could not be accurately   estimated. - Inferior vena cava: The vessel was normal in size. The   respirophasic diameter changes were in the normal range (>= 50%),   consistent with normal central venous pressure.  Patient Profile     56 y.o. female with history of HTN, migraine disorder, and dependent edema who has had diarrhea for the past 2-3 days, taking up to 12 Imodium daily presented to [facility] with VF arrest. Cardiac cath showed no significant CAD.  Assessment & Plan    1.  VF arrest: -Inciting event is still somewhat unclear, though given recent diarrhea and significant electrolyte abnormalities on admission, it has been suspected that she may have had a primary arrhythmogenic event. Catheterization showed no significant coronary artery disease. Severely reduced LVEF could have been a risk factor for ventricular arrhythmia, though it is possible that her cardiomyopathy  is secondary to cardiac arrest, CPR, and defibrillation.  - continue supportive care.  2. Acute hypoxic respiratory failure: -Remains intubated and sedated -Wean oxygen as able per PCCM . White cell count continues to go up. She is on antibiotics.  3. Acute systolic CHF: -Severely reduced LVEF by LV gram - Echo with an EF of 10-15% with global hypokinesis. - Will try to wean off epinephrine drip and keep her on milrinone and norepinephrine. - CVP was 9. No need to diurese and we might need to consider gentle hydration. - once clinically improved, we should repeat echocardiogram to reevaluate ejection fraction.  4. Cardiogenic shock: - Continue pressors as outlined above  5. AKI: - stable.   I discussed the case with the patient's husband and with critical care.    Signed, Kathlyn Sacramento, MD [facility] 02/28/2017, 9:39 AM

## 2017-02-28 NOTE — Progress Notes (Signed)
Pharmacy Antibiotic Note  Miranda [name] is a 56 y.o. female admitted on 02/25/2017 with cardiac arrest now with significant leukocytosis starting on empiric antibiotics per CCM.  Pharmacy has been consulted for vancomycin and Zosyn dosing.  Plan: DW 66kg  Vd 46L kei 0.049 hr-1  T1/2 14 hours Vancomycin 1 gram q 18 hours ordered with stacked dosing. Level before 5th dose. Goal trough 15-20.  Zosyn 3.375 grams q 8 hours ordered.  Height: 5\' 2"  (157.5 cm) Weight: 195 lb 8.8 oz (88.7 kg) IBW/kg (Calculated) : 50.1  Temp (24hrs), Avg:94.8 F (34.9 C), Min:89.5 F (31.9 C), Max:98.8 F (37.1 C)   Recent Labs Lab 02/25/17 2240 02/26/17 0210 02/26/17 0214 02/26/17 0340  02/27/17 0425 02/27/17 0804 02/27/17 1211 02/27/17 1619 02/28/17 0431  WBC 19.2*  --  29.4* 26.1*  --  27.2*  --   --   --  47.4*  CREATININE 1.14*  --  0.92 1.08*  < > 0.97 0.98 0.86 1.09* 1.17*  LATICACIDVEN 7.2* 5.2*  --   --   --   --   --   --   --   --   < > = values in this interval not displayed.  Estimated Creatinine Clearance: 54.2 mL/min (A) (by C-G formula based on SCr of 1.17 mg/dL (H)).    Allergies  Allergen Reactions  . Ace Inhibitors   . Compazine [Prochlorperazine Edisylate]   . Lisinopril   . Penicillins     Has patient had a PCN reaction causing immediate rash, facial/tongue/throat swelling, SOB or lightheadedness with hypotension: Unknown Has patient had a PCN reaction causing severe rash involving mucus membranes or skin necrosis: Unknown Has patient had a PCN reaction that required hospitalization: Unknown Has patient had a PCN reaction occurring within the last 10 years: Unknown If all of the above answers are "NO", then may proceed with Cephalosporin use.     Antimicrobials this admission: Cefepime x1 8/21; Vancomycin, Zosyn 8/24  >>    >>   Dose adjustments this admission:   Microbiology results: 8/22 BCx: pending 8/22 MRSA PCR: (-)      8/24: CXR pending 8/22 UA:  LE(-) NO2(-) WBC 6-30  Thank you for allowing pharmacy to be a part of this patient's care.  Miranda [name] S 02/28/2017 5:48 AM

## 2017-02-28 NOTE — Progress Notes (Addendum)
Normothermic. During WUA and at short intervals today,pt, arouses to follow simple commands. Shakes head yes and no to questions.  Agitated, anxious and gagging on ETT if fully awake. Currently on levophed . Milrinone at 0.73mcg. Fentany at 73mcg/hr. Precedex at 0.6.   Epinephrine gtt weaned off at 1600. Sputum and urine cx sent today. Lactic acid now 2.0. On Vancomycin and zosyn abx coverage. Lungs clear and diminished. Sputum white small amounts. UOP much improved. Continue to wean off pressor. Milrinone gtt is not to be weaned yet per Dr Kirke Corin. Attempt to keep sedation at lowest rate to keep pt comfortable and minimize agitation.  Family at bedside all day. Numerous friends and coworkers provide much support for both family and pt.

## 2017-02-28 NOTE — Progress Notes (Signed)
PULMONARY / CRITICAL CARE MEDICINE   Name: Miranda [name] MRN: [id number] DOB: Nov 06, 1960    ADMISSION DATE:  02/25/2017   PT PROFILE: 73 F suffered OOH arrest with at least 20 mins ACLS/CPR. Initial identified rhythm was VF. Underwent defib X 3 before ROSC. LHC revealed no CAD, severely reduced LV function and moderately elevated LVEDP. TTM 33 degree protocol initiated.  MAJOR EVENTS/TEST RESULTS: 08/21 CT head: no acute findings 08/21 CTA chest: no PE. Diffuse bilateral opacities 08/22 LHC: no CAD, severely reduced LV function and moderately elevated LVEDP 08/22 Echocardiogram:  08/22 Refractory shock despite norepinephrine and phenylephrine infusions. Epinephrine infusion initiated 08/23 Rewarming initiated. Sinus bradyarrhythmias. Amiodarone DC'd. Milrinone initiated. Improved gas exchange. Off of phenylephrine and norepi. Weaning of of epinephrine  INDWELLING DEVICES:: ETT 08/21 >>  L IJ CVL 08/22 >>  R femoral A-line 08/22 >>   MICRO DATA: MRSA PCR 08/22 >> NEG Blood 08/22 >>   ANTIMICROBIALS:     SUBJECTIVE:  Patient sedation was held this morning. She does wake up and responds appropriately to commands. This morning she became agitated and was started back on Precedex. With her requiring multiple pressors will hold on spontaneous breathing trial at this time  VITAL SIGNS: BP 109/66   Pulse 79   Temp (!) 95.2 F (35.1 C)   Resp (!) 30   Ht 5\' 2"  (1.575 m)   Wt 88.7 kg (195 lb 8.8 oz)   SpO2 100%   BMI 35.77 kg/m   HEMODYNAMICS:    VENTILATOR SETTINGS: Vent Mode: PCV FiO2 (%):  [30 %-50 %] 50 % Set Rate:  [20 bmp] 20 bmp Vt Set:  [450 mL] 450 mL PEEP:  [5 cmH20-7 cmH20] 5 cmH20  INTAKE / OUTPUT: I/O last 3 completed shifts: In: 4417 [I.V.:3567; IV Piggyback:850] Out: 850 [Urine:620; Emesis/NG output:230]  PHYSICAL EXAMINATION: General:  Intubated, sedated, paralyzed Neuro:  PERRL, cannot assess rest of exam due to NMBs HEENT:  AT,[place], Pupis 3 non  reactive Cardiovascular:  Irreg, brady, no M RHythm is sinus brady with junctional escape beats and PACs Lungs: Clear anteriorly Abdomen: soft, diminished to absent BS Ext: no edema  LABS:  BMET  Recent Labs Lab 02/27/17 1211 02/27/17 1619 02/28/17 0431  NA 136 134* 130*  K 4.0 4.1 4.4  CL 111 109 105  CO2 18* 19* 16*  BUN 21* 22* 22*  CREATININE 0.86 1.09* 1.17*  GLUCOSE 173* 135* 214*    Electrolytes  Recent Labs Lab 02/26/17 0340  02/26/17 1235 02/26/17 1620  02/27/17 0425  02/27/17 1211 02/27/17 1619 02/28/17 0431  CALCIUM 8.4*  < > 7.9* 7.9*  < > 8.0*  < > 8.2* 8.1* 8.2*  MG 1.8  --  2.2 2.8*  --  1.7  --   --   --   --   PHOS 3.6  --   --   --   --   --   --   --   --   --   < > = values in this interval not displayed.  CBC  Recent Labs Lab 02/26/17 0340 02/27/17 0425 02/28/17 0431  WBC 26.1* 27.2* 47.4*  HGB 12.7 12.2 11.0*  HCT 36.8 35.7 31.6*  PLT 460* 361 376    Coag's  Recent Labs Lab 02/26/17 0340 02/26/17 0612 02/26/17 1237  APTT 30 33 34  INR 1.18 1.29 1.34    Sepsis Markers  Recent Labs Lab 02/25/17 2240 02/26/17 0210 02/28/17 0431 02/28/17 0625  LATICACIDVEN  7.2* 5.2*  --  2.9*  PROCALCITON  --   --  2.79  --     ABG  Recent Labs Lab 02/26/17 0205 02/26/17 1227 02/28/17 0604  PHART 7.29* 7.32* 7.44  PCO2ART 39 35 24*  PO2ART 92 127* 83    Liver Enzymes  Recent Labs Lab 02/26/17 1620  AST 573*  ALT 416*    Cardiac Enzymes  Recent Labs Lab 02/25/17 2240  TROPONINI 0.03*    Glucose  Recent Labs Lab 02/27/17 1620 02/27/17 1729 02/27/17 1958 02/28/17 0007 02/28/17 0422 02/28/17 0734  GLUCAP 117* 130* 130* 152* 211* 215*    CXR: somewhat improved edema pattern    ASSESSMENT / PLAN:  PULMONARY A: Acute hypoxemic respiratory failure after cardiac arrest Pulmonary edema P:   Cont vent support - settings reviewed and/or adjusted Cont vent bundle Daily SBT if/when meets  criteria  CARDIOVASCULAR A:  Cardiac arrest - initial rhythm VF Sinus bradycardia with junctional escape and PACs Hx of Hypertension P:  Cardiology following MAP goal changed to > 65 mmHg Wean epi and norepi as able Amiodarone DC'd 08/23 Milrinone initiated 08/23   RENAL A:   AKI  P:   Monitor BMET intermittently Monitor I/Os Correct electrolytes as indicated  GASTROINTESTINAL A:   Recent diarrheal illness P:   SUP: IV famotidine Consider TF protocol 08/24  HEMATOLOGIC A:   No acute Issues P:  DVT px: SQ heparin Monitor CBC intermittently Transfuse per usual guidelines  INFECTIOUS A:   Leukocytosis - significantly increased white count up to 47.4. Vancomycin and Zosyn started, blood urine and sputum cultures obtained P:   Monitor temp, WBC count Micro and abx as above   ENDOCRINE A:   Stress induced hyperglycemia  P:   Cont insulin gtt per protocol  NEUROLOGIC A:   Anoxic encephalopathy ICU/vent associated discomfort P:   RASS goal: N/A while on NMBs.  RASS goal after DC NMB: -1, -2 Rewarm per protocol After rewarmed, DC continuous sedative infusions    FAMILY: husband updated @ bedside   CCM time 45 mins Tora Kindred, DO

## 2017-03-01 ENCOUNTER — Inpatient Hospital Stay: Payer: BC Managed Care – PPO

## 2017-03-01 DIAGNOSIS — I5021 Acute systolic (congestive) heart failure: Secondary | ICD-10-CM

## 2017-03-01 LAB — BASIC METABOLIC PANEL
ANION GAP: 7 (ref 5–15)
BUN: 16 mg/dL (ref 6–20)
CALCIUM: 8.3 mg/dL — AB (ref 8.9–10.3)
CO2: 20 mmol/L — ABNORMAL LOW (ref 22–32)
Chloride: 108 mmol/L (ref 101–111)
Creatinine, Ser: 0.84 mg/dL (ref 0.44–1.00)
GFR calc Af Amer: >60 mL/min (ref >60)
GLUCOSE: 120 mg/dL — AB (ref 65–99)
Potassium: 4.8 mmol/L (ref 3.5–5.1)
Sodium: 135 mmol/L (ref 135–145)

## 2017-03-01 LAB — CBC WITH DIFFERENTIAL/PLATELET
Basophils Absolute: 0 10*3/uL (ref 0–0.1)
Basophils Relative: 0 %
EOS PCT: 0 %
Eosinophils Absolute: 0 10*3/uL (ref 0–0.7)
HCT: 30.3 % — ABNORMAL LOW (ref 35.0–47.0)
Hemoglobin: 10.5 g/dL — ABNORMAL LOW (ref 12.0–16.0)
LYMPHS ABS: 1 10*3/uL (ref 1.0–3.6)
LYMPHS PCT: 4 %
MCH: 30.9 pg (ref 26.0–34.0)
MCHC: 34.5 g/dL (ref 32.0–36.0)
MCV: 89.4 fL (ref 80.0–100.0)
MONO ABS: 0.4 10*3/uL (ref 0.2–0.9)
Monocytes Relative: 2 %
Neutro Abs: 25 10*3/uL — ABNORMAL HIGH (ref 1.4–6.5)
Neutrophils Relative %: 94 %
Platelets: 266 10*3/uL (ref 150–440)
RBC: 3.39 MIL/uL — ABNORMAL LOW (ref 3.80–5.20)
RDW: 13 % (ref 11.5–14.5)
WBC: 26.5 10*3/uL — ABNORMAL HIGH (ref 3.6–11.0)

## 2017-03-01 LAB — GLUCOSE, CAPILLARY
GLUCOSE-CAPILLARY: 95 mg/dL (ref 65–99)
GLUCOSE-CAPILLARY: 142 mg/dL — AB (ref 65–99)
GLUCOSE-CAPILLARY: 117 mg/dL — AB (ref 65–99)
Glucose-Capillary: 132 mg/dL — ABNORMAL HIGH (ref 65–99)
Glucose-Capillary: 101 mg/dL — ABNORMAL HIGH (ref 65–99)
Glucose-Capillary: 152 mg/dL — ABNORMAL HIGH (ref 65–99)

## 2017-03-01 LAB — LACTIC ACID, PLASMA: LACTIC ACID, VENOUS: 0.6 mmol/L (ref 0.5–1.9)

## 2017-03-01 LAB — URINE CULTURE
Culture: NO GROWTH
SPECIAL REQUESTS: NORMAL

## 2017-03-01 LAB — MAGNESIUM: Magnesium: 2 mg/dL (ref 1.7–2.4)

## 2017-03-01 LAB — COOXEMETRY PANEL
Carboxyhemoglobin: 2.2 % — ABNORMAL HIGH (ref 0.5–1.5)
METHEMOGLOBIN: 1.1 % (ref 0.0–1.5)
O2 Saturation: 96.7 %

## 2017-03-01 LAB — PROCALCITONIN: PROCALCITONIN: 0.81 ng/mL

## 2017-03-01 LAB — PHOSPHORUS: Phosphorus: 2.5 mg/dL (ref 2.5–4.6)

## 2017-03-01 MED ORDER — METHYLPREDNISOLONE SODIUM SUCC 125 MG IJ SOLR
INTRAMUSCULAR | Status: AC
  Administered 2017-03-01: 80 mg via INTRAVENOUS
  Filled 2017-03-01: qty 2

## 2017-03-01 MED ORDER — ETOMIDATE 2 MG/ML IV SOLN
20.0000 mg | Freq: Once | INTRAVENOUS | Status: AC
  Administered 2017-03-01: 20 mg via INTRAVENOUS

## 2017-03-01 MED ORDER — METHYLPREDNISOLONE SODIUM SUCC 125 MG IJ SOLR
80.0000 mg | Freq: Once | INTRAMUSCULAR | Status: AC
  Administered 2017-03-01: 80 mg via INTRAVENOUS

## 2017-03-01 MED ORDER — MIDAZOLAM HCL 2 MG/2ML IJ SOLN
2.0000 mg | INTRAMUSCULAR | Status: DC | PRN
  Administered 2017-03-01 – 2017-03-04 (×8): 2 mg via INTRAVENOUS
  Filled 2017-03-01 (×7): qty 2

## 2017-03-01 MED ORDER — ROCURONIUM BROMIDE 50 MG/5ML IV SOLN
50.0000 mg | Freq: Once | INTRAVENOUS | Status: AC
  Administered 2017-03-01: 50 mg via INTRAVENOUS

## 2017-03-01 MED ORDER — NALOXONE HCL 0.4 MG/ML IJ SOLN
INTRAMUSCULAR | Status: AC
  Administered 2017-03-01: 0.2 mg via INTRAVENOUS
  Filled 2017-03-01: qty 1

## 2017-03-01 MED ORDER — MIDAZOLAM HCL 2 MG/2ML IJ SOLN
INTRAMUSCULAR | Status: AC
  Administered 2017-03-01: 2 mg via INTRAVENOUS
  Filled 2017-03-01: qty 2

## 2017-03-01 MED ORDER — LEVALBUTEROL HCL 1.25 MG/0.5ML IN NEBU
1.2500 mg | INHALATION_SOLUTION | Freq: Four times a day (QID) | RESPIRATORY_TRACT | Status: DC | PRN
  Filled 2017-03-01: qty 0.5

## 2017-03-01 MED ORDER — FENTANYL CITRATE (PF) 100 MCG/2ML IJ SOLN
INTRAMUSCULAR | Status: AC
  Filled 2017-03-01: qty 2

## 2017-03-01 MED ORDER — ROCURONIUM BROMIDE 50 MG/5ML IV SOLN
INTRAVENOUS | Status: AC
  Administered 2017-03-01: 50 mg via INTRAVENOUS
  Filled 2017-03-01: qty 1

## 2017-03-01 MED ORDER — ACETAMINOPHEN 325 MG PO TABS
650.0000 mg | ORAL_TABLET | ORAL | Status: DC | PRN
  Administered 2017-03-01 – 2017-03-11 (×5): 650 mg via ORAL
  Filled 2017-03-01 (×5): qty 2

## 2017-03-01 MED ORDER — ETOMIDATE 2 MG/ML IV SOLN
INTRAVENOUS | Status: AC
  Administered 2017-03-01: 20 mg via INTRAVENOUS
  Filled 2017-03-01: qty 10

## 2017-03-01 MED ORDER — LEVALBUTEROL HCL 0.63 MG/3ML IN NEBU
INHALATION_SOLUTION | RESPIRATORY_TRACT | Status: AC
  Administered 2017-03-01: 0.63 mg
  Filled 2017-03-01: qty 6

## 2017-03-01 MED ORDER — NALOXONE HCL 0.4 MG/ML IJ SOLN
0.2000 mg | INTRAMUSCULAR | Status: DC | PRN
  Administered 2017-03-01 – 2017-03-05 (×3): 0.2 mg via INTRAVENOUS
  Filled 2017-03-01: qty 1

## 2017-03-01 NOTE — Progress Notes (Signed)
Critical carboxyhemoglobin 2.2 results received from bedside RN and relayed to Dr Arsenio Loader, eMD.

## 2017-03-01 NOTE — Progress Notes (Signed)
With turning and repositioning, left IJ has migrated out about 4 inches.  Continues to flush easily and give blood return back.  Notified Dr. Lonn Georgia of concern for central line no longer being correctly placed.  [facility] is currently in unit.  Per Dr. Lonn Georgia, consult [facility] to place PICC/Central line.  Once line is placed, remove Left IJ per Dr. Lonn Georgia order.

## 2017-03-01 NOTE — Progress Notes (Signed)
Pt remains on vent throughout our shift.  This morning pt was extremely agitated and anxious, would follow simple commands at time.  However, pt was gagging and fighting vent, therefore fentanyl and precedex infusions were restarted.  Per Dr. Lonn Georgia, goal was to rest pt and keep her comfortable for today (as pt was extubated early this morning around 0430 and had to be reintubated shortly after).  Pt is NSR/ Sinus Bradycardia on telemetry.  LUngs sounds range from rhonchi, to clear this afternoon and evening.  OG tube placed and verified by Abdominal X-ray, tube feedings then started.  Pt has tolerated tube feedings, with no vomiting.  Adequate urine output from foley.  Unable to maintain pt MAP >65 while on sedation, therefore pt restarted back on Levophed gtt to maintain MAP goal >65.  Pt requires frequent titration of sedatives due to fluatuating agitation vs. Bradycardia.  She also requires frequent titration of levophed to maintain MAP >65 vs. Hypertensive with agitation.  Remains on Milnirone infusion.  Upon repositioning and foley/pericare this evening, pt's left IJ was noted to have withdrawn approximately 3 inches (continued to flush well and positive blood return).  Dr. Lonn Georgia was notified, and [facility] placed right arm PICC.  Left IJ TLC catheter to be removed, Alecia (night shift RN) will remove IJ and transfer all IV fluids to PICC for infusion.  Pt's husband Annette Stable, son, and daughter are aware of IJ being withdrawn and gave consent for PICC placement.

## 2017-03-01 NOTE — Progress Notes (Signed)
Progress Note  Patient Name: Miranda [name] Date of Encounter: 03/01/2017  Primary Cardiologist: Rockey Situ  Subjective   Pt intubated, sedated    Inpatient Medications    Scheduled Meds: . chlorhexidine gluconate (MEDLINE KIT)  15 mL Mouth Rinse BID  . sennosides  5 mL Per Tube BID   And  . docusate  100 mg Per Tube BID  . feeding supplement (VITAL HIGH PROTEIN)  1,000 mL Per Tube Q24H  . heparin  5,000 Units Subcutaneous Q8H  . insulin aspart  2-6 Units Subcutaneous Q4H  . insulin glargine  10 Units Subcutaneous Q24H  . mouth rinse  15 mL Mouth Rinse 10 times per day  . sodium chloride flush  10-40 mL Intracatheter Q12H   Continuous Infusions: . dexmedetomidine (PRECEDEX) IV infusion Stopped (02/28/17 2155)  . dextrose    . epinephrine Stopped (02/28/17 1600)  . famotidine (PEPCID) IV Stopped (02/28/17 2220)  . fentaNYL infusion INTRAVENOUS 60 mcg/hr (03/01/17 0815)  . milrinone 0.5 mcg/kg/min (03/01/17 0900)  . norepinephrine (LEVOPHED) Adult infusion 8 mcg/min (03/01/17 0843)  . piperacillin-tazobactam (ZOSYN)  IV 3.375 g (03/01/17 0624)  . vancomycin Stopped (02/28/17 1930)   PRN Meds: acetaminophen, atropine, dextrose, levalbuterol, LORazepam, naLOXone (NARCAN)  injection, pneumococcal 23 valent vaccine, sodium chloride flush   Vital Signs    Vitals:   03/01/17 0800 03/01/17 0815 03/01/17 0830 03/01/17 0900  BP: (!) 84/44 (!) 152/78 109/64 130/70  Pulse: 75 85 77 84  Resp: (!) 27 (!) 26 18 (!) 25  Temp: 100 F (37.8 C) 100.2 F (37.9 C) (!) 100.4 F (38 C) (!) 100.6 F (38.1 C)  TempSrc:      SpO2: 100% 95% 100% 97%  Weight:      Height:        Intake/Output Summary (Last 24 hours) at 03/01/17 0920 Last data filed at 03/01/17 0900  Gross per 24 hour  Intake          1795.89 ml  Output             2735 ml  Net          -939.11 ml   I/O  +5.4 L  Filed Weights   02/25/17 2304 02/26/17 0226  Weight: 195 lb 8.8 oz (88.7 kg) 195 lb 8.8 oz (88.7 kg)     Telemetry    SR   - Personally Reviewed  ECG      Physical Exam   GEN:  Pt intubated, sedated    Neck: No JVD Cardiac: RRR, no murmurs, rubs, or gallops.  Respiratory: Clear to auscultation bilaterally. GI: Soft, nontender, non-distended  MS: No edema; No deformity. Neuro:  Nonfocal  Psych: Normal affect   Labs    Chemistry Recent Labs Lab 02/26/17 1620  02/28/17 0431 02/28/17 1026 02/28/17 1754 02/28/17 2257 03/01/17 0606  NA 139  < > 130* 133*  --   --  135  K 3.6  < > 4.4 4.8 5.2* 5.4* 4.8  CL 108  < > 105 105  --   --  108  CO2 19*  < > 16* 18*  --   --  20*  GLUCOSE 319*  < > 214* 204*  --   --  120*  BUN 23*  < > 22* 20  --   --  16  CREATININE 1.16*  < > 1.17* 1.03*  --   --  0.84  CALCIUM 7.9*  < > 8.2* 8.3*  --   --  8.3*  AST 573*  --   --   --   --   --   --   ALT 416*  --   --   --   --   --   --   GFRNONAA 51*  < > 50* 59*  --   --  >60  GFRAA 59*  < > 58* >60  --   --  >60  ANIONGAP 12  < > 9 10  --   --  7  < > = values in this interval not displayed.   Hematology Recent Labs Lab 02/27/17 0425 02/28/17 0431 03/01/17 0606  WBC 27.2* 47.4* 26.5*  RBC 4.01 3.61* 3.39*  HGB 12.2 11.0* 10.5*  HCT 35.7 31.6* 30.3*  MCV 89.0 87.3 89.4  MCH 30.4 30.4 30.9  MCHC 34.2 34.8 34.5  RDW 12.5 12.8 13.0  PLT 361 376 266    Cardiac Enzymes Recent Labs Lab 02/25/17 2240  TROPONINI 0.03*   No results for input(s): TROPIPOC in the last 168 hours.   BNPNo results for input(s): BNP, PROBNP in the last 168 hours.   DDimer No results for input(s): DDIMER in the last 168 hours.   Radiology    Dg Chest Port 1 View  Addendum Date: 03/01/2017   ADDENDUM REPORT: 03/01/2017 06:41 ADDENDUM: Study discussed by telephone with NP Dinzy on 03/01/2017 at 0635 hours. She advised the patient had been extubated and was re-intubated at 0430 hours today, and that the re-intubation was traumatic. Therefore, favor that trauma as the origin of the  pneumomediastinum and soft tissue gas. I advised that if this gas accumulations seem to be progressing on clinical exam or subsequent radiographs then further evaluation of the extent of trauma would be recommended, such as with Chest CT (IV contrast preferred). Electronically Signed   By: Genevie Ann M.D.   On: 03/01/2017 06:41   Result Date: 03/01/2017 CLINICAL DATA:  56 year old female cardiac arrest with V fib. Intubated. EXAM: PORTABLE CHEST 1 VIEW COMPARISON:  02/28/2017 and earlier. FINDINGS: Portable AP semi upright view at 0542 hours. Endotracheal tube tip projects about 10 mm above the carina. Left IJ central line appears stable. Enteric tube has been removed. New pneumomediastinum and supraclavicular subcutaneous gas. There is no pneumothorax identified. There is moderate gaseous distension of the visible stomach. Increasing left lower lobe collapse or consolidation. Continued bilateral hazy pulmonary opacity. No pleural effusion identified. Stable cardiac size and mediastinal contours. IMPRESSION: 1. New pneumomediastinum and supraclavicular subcutaneous emphysema. Etiology is unclear, with no pneumothorax identified. 2. Enteric tube removed. ET tube tip 10 mm above the carina and stable left IJ central line. 3. New left lower lobe collapse or consolidation. Continued diffuse hazy pulmonary opacity which might reflect pulmonary edema or ARDS. 4. Interval gaseous distension of the stomach. Electronically Signed: By: Genevie Ann M.D. On: 03/01/2017 06:19   Dg Chest Port 1 View  Result Date: 02/28/2017 CLINICAL DATA:  Respiratory failure. EXAM: PORTABLE CHEST 1 VIEW COMPARISON:  09/2016 FINDINGS: Endotracheal tube, left IJ line, NG tube in stable position. Heart size stable. Persistent bilateral airspace disease again noted without significant change. No prominent pleural effusion. No pneumothorax. IMPRESSION: 1. Lines and tubes in stable position. 2. Persistent bilateral diffuse airspace disease without  significant change. Electronically Signed   By: Marcello Moores  Register   On: 02/28/2017 06:21    Cardiac Studies    Patient Profile     56 y.o. female with history of HTN, migraine disorder,  and dependent edema who has had diarrhea for the past 2-3 days, taking up to 12 Imodium daily presented to [facility] with VF arrest. Cardiac cath showed no significant CAD.  Assessment & Plan    1  VF arrest  Etiol unclear  ? Electrolytes  ? Primary in setting of cardiomyopathy  Remains intubated    2  Acute systolic CHF  LVEF 10 to 35%   Normal coronary arteries  LVEDP 28  Currently on pressors and milrinone  Wean pressors  As bp tolerates  Check COOX    Signed, Dorris Carnes, MD  03/01/2017, 9:20 AM

## 2017-03-01 NOTE — Progress Notes (Signed)
eLink Physician-Brief Progress Note Patient Name: Miranda [name] DOB: June 11, 1961 MRN: [id number]   Date of Service  03/01/2017  HPI/Events of Note  Carboxyhemoglobin on COOX = 2.2. Etiology uncertain. No hx of tobacco use or smoke inhalation. Lab error?  eICU Interventions  Will repeat Carboxyhemoglobin at 9 PM.      Intervention Category Major Interventions: Other:  Miranda [name] 03/01/2017, 4:00 PM

## 2017-03-01 NOTE — Progress Notes (Signed)
ART BP 73/45.  Correlating with cuff BP 73/43.  Levophed increased to 16mcg/min and fentanyl decreased to 58mcg/hr with improved BP resulting.  See VS flowsheet.

## 2017-03-01 NOTE — Progress Notes (Signed)
MEDICATION RELATED CONSULT NOTE  Pharmacy Consult for Electrolyte Monitoring Indication: Hypomagnesemia  Allergies  Allergen Reactions  . Ace Inhibitors   . Compazine [Prochlorperazine Edisylate]   . Lisinopril   . Penicillins     Has patient had a PCN reaction causing immediate rash, facial/tongue/throat swelling, SOB or lightheadedness with hypotension: Unknown Has patient had a PCN reaction causing severe rash involving mucus membranes or skin necrosis: Unknown Has patient had a PCN reaction that required hospitalization: Unknown Has patient had a PCN reaction occurring within the last 10 years: Unknown If all of the above answers are "NO", then may proceed with Cephalosporin use.     Patient Measurements: Height: 5\' 2"  (157.5 cm) Weight: 195 lb 8.8 oz (88.7 kg) IBW/kg (Calculated) : 50.1  Vital Signs: Temp: 100.6 F (38.1 C) (08/25 0900) Temp Source: Core (Comment) (08/25 0700) BP: 130/70 (08/25 0900) Pulse Rate: 84 (08/25 0900) Intake/Output from previous day: 08/24 0701 - 08/25 0700 In: 1964.2 [I.V.:1203.2; NG/GT:356; IV Piggyback:405] Out: 2785 [Urine:2785] Intake/Output from this shift: Total I/O In: -  Out: 200 [Urine:200]  Labs:  Recent Labs  02/26/17 1237 02/26/17 1620  02/27/17 0425  02/28/17 0431 02/28/17 1026 02/28/17 1754 03/01/17 0606  WBC  --   --   --  27.2*  --  47.4*  --   --  26.5*  HGB  --   --   --  12.2  --  11.0*  --   --  10.5*  HCT  --   --   --  35.7  --  31.6*  --   --  30.3*  PLT  --   --   --  361  --  376  --   --  266  APTT 34  --   --   --   --   --   --   --   --   CREATININE  --  1.16*  < > 0.97  < > 1.17* 1.03*  --  0.84  MG  --  2.8*  --  1.7  --   --  1.2* 2.7* 2.0  PHOS  --   --   --   --   --   --   --   --  2.5  AST  --  573*  --   --   --   --   --   --   --   ALT  --  416*  --   --   --   --   --   --   --   < > = values in this interval not displayed. Estimated Creatinine Clearance: 75.5 mL/min (by C-G formula  based on SCr of 0.84 mg/dL).  Sodium (mmol/L)  Date Value  03/01/2017 135  08/16/2016 141   Potassium (mmol/L)  Date Value  03/01/2017 4.8   Calcium (mg/dL)  Date Value  42/70/6237 8.3 (L)    Assessment: 56 y/o F s/p cardiac arrest and hypothermia protocol with hypomagnesemia. Due to high risk of recurrent arrhythmia, cardiology wants a goal K of > or = 4 and Mg > or = 2.  Plan:  K = 4.8, Mg = 2.0 this morning. No supplementation needed at this time.  Will recheck K and Magnesium with am labs.  Demetrius Charity, PharmD Clinical Pharmacist 03/01/2017,9:49 AM

## 2017-03-01 NOTE — Progress Notes (Signed)
Extubated Pt today 03/01/17 at 0430.

## 2017-03-01 NOTE — Progress Notes (Signed)
PULMONARY / CRITICAL CARE MEDICINE   Name: Miranda [name] MRN: [id number] DOB: 1961/03/16    ADMISSION DATE:  02/25/2017   PT PROFILE: 33 F suffered OOH arrest with at least 20 mins ACLS/CPR. Initial identified rhythm was VF. Underwent defib X 3 before ROSC. LHC revealed no CAD, severely reduced LV function and moderately elevated LVEDP. TTM 33 degree protocol initiated.  MAJOR EVENTS/TEST RESULTS: 08/21 CT head: no acute findings 08/21 CTA chest: no PE. Diffuse bilateral opacities 08/22 LHC: no CAD, severely reduced LV function and moderately elevated LVEDP 08/22 Echocardiogram:  08/22 Refractory shock despite norepinephrine and phenylephrine infusions. Epinephrine infusion initiated 08/23 Rewarming initiated. Sinus bradyarrhythmias. Amiodarone DC'd. Milrinone initiated. Improved gas exchange. Off of phenylephrine and norepi. Weaning of of epinephrine  INDWELLING DEVICES:: ETT 08/21 >>  L IJ CVL 08/22 >>  R femoral A-line 08/22 >>   MICRO DATA: MRSA PCR 08/22 >> NEG Blood 08/22 >>   ANTIMICROBIALS:     SUBJECTIVE:  Events of the evening were noted. Patient unfortunately did not tolerate extubation, was a significantly difficult reintubation. Required emergency department to place tube. Postintubation she was noted to have subcutaneous air in her right neck and possible pneumopericardium on her chest x-ray. Presently has been weaned off of norepinephrine and epinephrine only on no known. Stable at this time.  VITAL SIGNS: BP (!) 155/91   Pulse 89   Temp 99.3 F (37.4 C) (Core (Comment))   Resp (!) 26   Ht 5\' 2"  (1.575 m)   Wt 88.7 kg (195 lb 8.8 oz)   SpO2 91%   BMI 35.77 kg/m   HEMODYNAMICS: CVP:  [11 mmHg] 11 mmHg  VENTILATOR SETTINGS: Vent Mode: PRVC FiO2 (%):  [50 %-100 %] 100 % Set Rate:  [20 bmp] 20 bmp Vt Set:  [408 mL] 408 mL PEEP:  [5 cmH20-8 cmH20] 8 cmH20  INTAKE / OUTPUT: I/O last 3 completed shifts: In: 3182 [I.V.:2191; NG/GT:336; IV  Piggyback:655] Out: 2931 [Urine:2710; Emesis/NG output:220; Stool:1]  PHYSICAL EXAMINATION: General:  Intubated Neuro: patient is responsive, communicating appropriately Cardiovascular:  Irreg, brady, no M RHythm is sinus brady with junctional escape beats and PACs Lungs: almost subcutaneous air noted in the right anterior neck Abdomen: soft, diminished to absent BS Ext: edema noted  LABS:  BMET  Recent Labs Lab 02/28/17 0431 02/28/17 1026 02/28/17 1754 02/28/17 2257 03/01/17 0606  NA 130* 133*  --   --  135  K 4.4 4.8 5.2* 5.4* 4.8  CL 105 105  --   --  108  CO2 16* 18*  --   --  20*  BUN 22* 20  --   --  16  CREATININE 1.17* 1.03*  --   --  0.84  GLUCOSE 214* 204*  --   --  120*    Electrolytes  Recent Labs Lab 02/26/17 0340  02/28/17 0431 02/28/17 1026 02/28/17 1754 03/01/17 0606  CALCIUM 8.4*  < > 8.2* 8.3*  --  8.3*  MG 1.8  < >  --  1.2* 2.7* 2.0  PHOS 3.6  --   --   --   --  2.5  < > = values in this interval not displayed.  CBC  Recent Labs Lab 02/27/17 0425 02/28/17 0431 03/01/17 0606  WBC 27.2* 47.4* 26.5*  HGB 12.2 11.0* 10.5*  HCT 35.7 31.6* 30.3*  PLT 361 376 266    Coag's  Recent Labs Lab 02/26/17 0340 02/26/17 0612 02/26/17 1237  APTT 30 33 34  INR 1.18 1.29 1.34    Sepsis Markers  Recent Labs Lab 02/28/17 0431 02/28/17 0625 02/28/17 0809 02/28/17 1321 03/01/17 0606  LATICACIDVEN  --  2.9* 2.6* 2.0*  --   PROCALCITON 2.79  --   --   --  0.81    ABG  Recent Labs Lab 02/26/17 0205 02/26/17 1227 02/28/17 0604  PHART 7.29* 7.32* 7.44  PCO2ART 39 35 24*  PO2ART 92 127* 83    Liver Enzymes  Recent Labs Lab 02/26/17 1620  AST 573*  ALT 416*    Cardiac Enzymes  Recent Labs Lab 02/25/17 2240  TROPONINI 0.03*    Glucose  Recent Labs Lab 02/28/17 0422 02/28/17 0734 02/28/17 1116 02/28/17 1645 02/28/17 2021 03/01/17 0053  GLUCAP 211* 215* 182* 114* 146* 132*    CXR: somewhat improved edema  pattern    ASSESSMENT / PLAN:  PULMONARY A: Acute hypoxemic respiratory failure after cardiac arrest Pulmonary edema. atient noted post reintubation to have subcutaneous air and possible pneumopericardium. At the present time no evidence of pneumothorax. Will follow chest x-rays.Presently hemodynamically stable has been weaned off to pressors now on milrinone only.  CARDIOVASCULAR A:  Cardiac arrest - initial rhythm VF Sinus bradycardia with junctional escape and PACs Hx of Hypertension P:  Cardiology following MAP goal changed to > 65 mmHg Wean epi and norepi as able Amiodarone DC'd 08/23 Milrinone initiated 08/23  RENAL A:   AKI  P:   Monitor BMET intermittently Monitor I/Os Correct electrolytes as indicated  GASTROINTESTINAL A:   Recent diarrheal illness P:   SUP: IV famotidine Consider TF protocol 08/24  HEMATOLOGIC A:   No acute Issues P:  DVT px: SQ heparin Monitor CBC intermittently Transfuse per usual guidelines  INFECTIOUS A:   Leukocytosis - patient white count significantly improved on Vancomycin and Zosyn, blood urine and sputum cultures pending.   P:   Monitor temp, WBC count Micro and abx as above   ENDOCRINE A:   Stress induced hyperglycemia  P:   Cont insulin gtt per protocol  NEUROLOGIC A:   Anoxic encephalopathy ICU/vent associated discomfort P:   RASS goal: N/A while on NMBs.  RASS goal after DC NMB: -1, -2 Rewarm per protocol After rewarmed, DC continuous sedative infusions    CCM time 35 mins Tora Kindred, DO

## 2017-03-01 NOTE — Progress Notes (Signed)
Spoke with [name], NP regarding pt's HR in 50s due to precedex drip; discussed weaning precedex drip off and increasing current order of fentanyl drip for sedation.

## 2017-03-01 NOTE — ED Provider Notes (Signed)
INTUBATION Performed by: Charlesetta Ivory P  Required items: required blood products, implants, devices, and special equipment available Patient identity confirmed: provided demographic data and hospital-assigned identification number Time out: Immediately prior to procedure a "time out" was called to verify the correct patient, procedure, equipment, support staff and site/side marked as required.  Indications: hypoxia and respiratory failure  Intubation method: Mac 4  Preoxygenation: BVM  Paralytic: Rocuronium  Tube Size: 7.5 cuffed  Post-procedure assessment: chest rise and ETCO2 monitor Breath sounds: equal and absent over the epigastrium Tube secured with: ETT holder at 22cm at the lip   Patient tolerated the procedure well with no immediate complications.  Please see code sheet for further details   Loney Hering, MD 03/01/17 [phone]

## 2017-03-01 NOTE — Progress Notes (Signed)
[facility] - [place] at [facility]   PATIENT NAME: Miranda [name]    MR#:  [id number]  DATE OF BIRTH:  1961-01-02  SUBJECTIVE:   Evidence of early morning noted. Patient got extubated at 4:30 in the morning. Patient went into respiratory distress and was a difficult intubation. ER physician ended up intubated patient. She is somewhat agitated and tachycardic. Low-grade fever.  Currently on IV milrinone drip and back on pressors. IV fentanyl for sedation.  Husband in the room.--- He expressed concern about the events from this morning  REVIEW OF SYSTEMS:   Review of Systems  Unable to perform ROS: Intubated    DRUG ALLERGIES:   Allergies  Allergen Reactions  . Ace Inhibitors   . Compazine [Prochlorperazine Edisylate]   . Lisinopril   . Penicillins     Has patient had a PCN reaction causing immediate rash, facial/tongue/throat swelling, SOB or lightheadedness with hypotension: Unknown Has patient had a PCN reaction causing severe rash involving mucus membranes or skin necrosis: Unknown Has patient had a PCN reaction that required hospitalization: Unknown Has patient had a PCN reaction occurring within the last 10 years: Unknown If all of the above answers are "NO", then may proceed with Cephalosporin use.     VITALS:  Blood pressure (!) 81/52, pulse 88, temperature 100.2 F (37.9 C), resp. rate (!) 21, height 5\' 2"  (1.575 m), weight 88.7 kg (195 lb 8.8 oz), SpO2 98 %.  PHYSICAL EXAMINATION:   Physical Exam  GENERAL:  56 y.o.-year-old patient lying in the bed with no acute distress. Critically ill EYES: Pupils equal, round, reactive to light and accommodation. No scleral icterus. Extraocular muscles intact.  HEENT: Head atraumatic, normocephalic. Oropharynx and nasopharynx clear. Intubated NECK:  Supple, no jugular venous distention. No thyroid enlargement, no tenderness, NG tube.  LUNGS: Normal breath sounds bilaterally, no wheezing, rales, rhonchi.  No use of accessory muscles of respiration.  CARDIOVASCULAR: S1, S2 normal. No murmurs, rubs, or gallops.  ABDOMEN: Soft, nontender, nondistended. Bowel sounds feeble. No organomegaly or mass.  EXTREMITIES: No cyanosis, clubbing or edema b/l.    NEUROLOGIC: Intubated on the ventilator PSYCHIATRIC:  Sedated and intubated on the ventilator  SKIN: No obvious rash, lesion, or ulcer.   LABORATORY PANEL:  CBC  Recent Labs Lab 03/01/17 0606  WBC 26.5*  HGB 10.5*  HCT 30.3*  PLT 266    Chemistries   Recent Labs Lab 02/26/17 1620  03/01/17 0606  NA 139  < > 135  K 3.6  < > 4.8  CL 108  < > 108  CO2 19*  < > 20*  GLUCOSE 319*  < > 120*  BUN 23*  < > 16  CREATININE 1.16*  < > 0.84  CALCIUM 7.9*  < > 8.3*  MG 2.8*  < > 2.0  AST 573*  --   --   ALT 416*  --   --   < > = values in this interval not displayed. Cardiac Enzymes  Recent Labs Lab 02/25/17 2240  TROPONINI 0.03*   RADIOLOGY:  Dg Chest Port 1 View  Addendum Date: 03/01/2017   ADDENDUM REPORT: 03/01/2017 06:41 ADDENDUM: Study discussed by telephone with NP Dinzy on 03/01/2017 at 0635 hours. She advised the patient had been extubated and was re-intubated at 0430 hours today, and that the re-intubation was traumatic. Therefore, favor that trauma as the origin of the pneumomediastinum and soft tissue gas. I advised that if this gas accumulations seem to be  progressing on clinical exam or subsequent radiographs then further evaluation of the extent of trauma would be recommended, such as with Chest CT (IV contrast preferred). Electronically Signed   By: Odessa Fleming M.D.   On: 03/01/2017 06:41   Result Date: 03/01/2017 CLINICAL DATA:  56 year old female cardiac arrest with V fib. Intubated. EXAM: PORTABLE CHEST 1 VIEW COMPARISON:  02/28/2017 and earlier. FINDINGS: Portable AP semi upright view at 0542 hours. Endotracheal tube tip projects about 10 mm above the carina. Left IJ central line appears stable. Enteric tube has been  removed. New pneumomediastinum and supraclavicular subcutaneous gas. There is no pneumothorax identified. There is moderate gaseous distension of the visible stomach. Increasing left lower lobe collapse or consolidation. Continued bilateral hazy pulmonary opacity. No pleural effusion identified. Stable cardiac size and mediastinal contours. IMPRESSION: 1. New pneumomediastinum and supraclavicular subcutaneous emphysema. Etiology is unclear, with no pneumothorax identified. 2. Enteric tube removed. ET tube tip 10 mm above the carina and stable left IJ central line. 3. New left lower lobe collapse or consolidation. Continued diffuse hazy pulmonary opacity which might reflect pulmonary edema or ARDS. 4. Interval gaseous distension of the stomach. Electronically Signed: By: Odessa Fleming M.D. On: 03/01/2017 06:19   Dg Chest Port 1 View  Result Date: 02/28/2017 CLINICAL DATA:  Respiratory failure. EXAM: PORTABLE CHEST 1 VIEW COMPARISON:  09/2016 FINDINGS: Endotracheal tube, left IJ line, NG tube in stable position. Heart size stable. Persistent bilateral airspace disease again noted without significant change. No prominent pleural effusion. No pneumothorax. IMPRESSION: 1. Lines and tubes in stable position. 2. Persistent bilateral diffuse airspace disease without significant change. Electronically Signed   By: Maisie Fus  Register   On: 02/28/2017 06:21   ASSESSMENT AND PLAN:   Miranda [name] reportedly had diarrhea over the last 2-3 days for which she was taking up to 12 Imodium per day. She was trying to stay well hydrated and had also stopped taking her blood pressure medications on account of her GI illness. Around 9 PM, Miranda [name]'s husband found the patient seated on the toilet. She attempted to stand up but passed out.  * Cardiac arrest due to ventricular fibrillation -Inciting event is still somewhat unclear, though given recent diarrhea and significant electrolyte abnormalities  Vs suspect that she may have had a  primary arrhythmogenic event. -Patient underwent urgent Catheterization last night showed no significant coronary artery disease. 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 -On IV levophed,Milrinone,fentanyl -Patient was  on hypothermia protocol -Appreciate ICU attending help -CT chest negative for PE -Patient after he was extubated by nurse practitioner this morning at 4:30 AM. She had to be reintubated apparently was a difficult reintubation and was intubated by ER physician. Currently intubated and on the ventilator  *Hypokalemia being repeated  *Acute renal failure secondary to #1 -making good urine -Creatinine stable  *Febrile illness -Low-grade fever, tachycardia elevated white count of 40,000 -Blood cultures so far negative -Empiric IV vancomycin and Zosyn  *Metabolic acidosis secondary to #1  *Hyperglycemia -Patient currently off insulin drip  * Nutrition -started on Tube feeding  *DVT prophylaxis subcutaneous heparin  Spoke with husband in the room.Discussed with Dr. Lonn Georgia  Patient is critically ill. Family understands.  Case discussed with Care Management/Social Worker. Management plans discussed with the  family and they are in agreement.  CODE STATUS: Full  DVT Prophylaxis: Subcutaneous heparin  TOTAL CRITICAL TIME TAKING CARE OF THIS PATIENT: 30 minutes.  >50% time spent on counselling and coordination of care  Note: This dictation was prepared with Dragon dictation along with smaller phrase technology. Any transcriptional errors that result from this process are unintentional.  Miranda [name] M.D on 03/01/2017 at 11:54 AM  Between 7am to 6pm - Pager - [phone]  After 6pm go to [email or web address] - Social research officer, government  [facility]  Office  [phone]  CC: Primary care physician;  Merwyn Katos, MD

## 2017-03-02 ENCOUNTER — Inpatient Hospital Stay: Payer: BC Managed Care – PPO

## 2017-03-02 LAB — CBC WITH DIFFERENTIAL/PLATELET
Basophils Absolute: 0 10*3/uL (ref 0–0.1)
Basophils Relative: 0 %
Eosinophils Absolute: 0 10*3/uL (ref 0–0.7)
Eosinophils Relative: 0 %
HEMATOCRIT: 25.7 % — AB (ref 35.0–47.0)
Hemoglobin: 8.8 g/dL — ABNORMAL LOW (ref 12.0–16.0)
LYMPHS PCT: 4 %
Lymphs Abs: 0.8 10*3/uL — ABNORMAL LOW (ref 1.0–3.6)
MCH: 30.2 pg (ref 26.0–34.0)
MCHC: 34.2 g/dL (ref 32.0–36.0)
MCV: 88.4 fL (ref 80.0–100.0)
MONO ABS: 0.6 10*3/uL (ref 0.2–0.9)
MONOS PCT: 3 %
NEUTROS ABS: 22.4 10*3/uL — AB (ref 1.4–6.5)
Neutrophils Relative %: 93 %
Platelets: 225 10*3/uL (ref 150–440)
RBC: 2.9 MIL/uL — ABNORMAL LOW (ref 3.80–5.20)
RDW: 13.1 % (ref 11.5–14.5)
WBC: 23.9 10*3/uL — ABNORMAL HIGH (ref 3.6–11.0)

## 2017-03-02 LAB — MAGNESIUM
Magnesium: 2 mg/dL (ref 1.7–2.4)
Magnesium: 1.9 mg/dL (ref 1.7–2.4)

## 2017-03-02 LAB — GLUCOSE, CAPILLARY
GLUCOSE-CAPILLARY: 133 mg/dL — AB (ref 65–99)
GLUCOSE-CAPILLARY: 112 mg/dL — AB (ref 65–99)
Glucose-Capillary: 115 mg/dL — ABNORMAL HIGH (ref 65–99)
Glucose-Capillary: 129 mg/dL — ABNORMAL HIGH (ref 65–99)
Glucose-Capillary: 133 mg/dL — ABNORMAL HIGH (ref 65–99)
Glucose-Capillary: 139 mg/dL — ABNORMAL HIGH (ref 65–99)
Glucose-Capillary: 145 mg/dL — ABNORMAL HIGH (ref 65–99)

## 2017-03-02 LAB — BASIC METABOLIC PANEL
ANION GAP: 7 (ref 5–15)
BUN: 27 mg/dL — AB (ref 6–20)
CHLORIDE: 109 mmol/L (ref 101–111)
CO2: 22 mmol/L (ref 22–32)
Calcium: 8.2 mg/dL — ABNORMAL LOW (ref 8.9–10.3)
Creatinine, Ser: 0.76 mg/dL (ref 0.44–1.00)
GFR calc Af Amer: >60 mL/min (ref >60)
GLUCOSE: 100 mg/dL — AB (ref 65–99)
POTASSIUM: 4.3 mmol/L (ref 3.5–5.1)
Sodium: 138 mmol/L (ref 135–145)

## 2017-03-02 LAB — COOXEMETRY PANEL
CARBOXYHEMOGLOBIN: 2.2 % — AB (ref 0.5–1.5)
CARBOXYHEMOGLOBIN: 2.6 % — AB (ref 0.5–1.5)
METHEMOGLOBIN: 1.2 % (ref 0.0–1.5)
Methemoglobin: 0.6 % (ref 0.0–1.5)
O2 SAT: 100 %
O2 Saturation: 98.9 %

## 2017-03-02 LAB — BLOOD GAS, ARTERIAL
ACID-BASE DEFICIT: 6.4 mmol/L — AB (ref 0.0–2.0)
Bicarbonate: 16.3 mmol/L — ABNORMAL LOW (ref 20.0–28.0)
LHR: 25 {breaths}/min
Map: 12 cmH20
Mechanical Rate: 20
O2 Saturation: 96.6 %
PATIENT TEMPERATURE: 37
PEEP/CPAP: 5 cmH2O
PH ART: 7.44 (ref 7.350–7.450)
Pressure control: 25 cmH2O
pCO2 arterial: 24 mmHg — ABNORMAL LOW (ref 32.0–48.0)
pO2, Arterial: 83 mmHg (ref 83.0–108.0)

## 2017-03-02 LAB — BRAIN NATRIURETIC PEPTIDE: B Natriuretic Peptide: 983 pg/mL — ABNORMAL HIGH (ref 0.0–100.0)

## 2017-03-02 LAB — PROCALCITONIN: PROCALCITONIN: 0.54 ng/mL

## 2017-03-02 LAB — TRIGLYCERIDES: TRIGLYCERIDES: 101 mg/dL (ref <150)

## 2017-03-02 LAB — BASIC METABOLIC PANEL WITH GFR
Anion gap: 6 (ref 5–15)
BUN: 16 mg/dL (ref 6–20)
CO2: 23 mmol/L (ref 22–32)
Calcium: 8.3 mg/dL — ABNORMAL LOW (ref 8.9–10.3)
Chloride: 105 mmol/L (ref 101–111)
Creatinine, Ser: 0.69 mg/dL (ref 0.44–1.00)
GFR calc Af Amer: >60 mL/min
GFR calc non Af Amer: >60 mL/min
Glucose, Bld: 149 mg/dL — ABNORMAL HIGH (ref 65–99)
Potassium: 4.6 mmol/L (ref 3.5–5.1)
Sodium: 134 mmol/L — ABNORMAL LOW (ref 135–145)

## 2017-03-02 LAB — VANCOMYCIN, TROUGH: Vancomycin Tr: 12 ug/mL — ABNORMAL LOW (ref 15–20)

## 2017-03-02 LAB — CULTURE, RESPIRATORY W GRAM STAIN

## 2017-03-02 LAB — CULTURE, RESPIRATORY

## 2017-03-02 MED ORDER — MIDAZOLAM HCL 2 MG/2ML IJ SOLN
2.0000 mg | Freq: Once | INTRAMUSCULAR | Status: AC
Start: 2017-03-02 — End: 2017-03-02
  Administered 2017-03-02: 2 mg via INTRAVENOUS

## 2017-03-02 MED ORDER — ALPRAZOLAM 0.5 MG PO TABS
0.5000 mg | ORAL_TABLET | Freq: Two times a day (BID) | ORAL | Status: DC
  Administered 2017-03-02: 0.5 mg via ORAL
  Filled 2017-03-02: qty 1

## 2017-03-02 MED ORDER — VITAL HIGH PROTEIN PO LIQD
1000.0000 mL | ORAL | Status: DC
  Administered 2017-03-02 (×4)
  Administered 2017-03-02: 1000 mL
  Administered 2017-03-02 – 2017-03-03 (×9)

## 2017-03-02 MED ORDER — HYDROCORTISONE NA SUCCINATE PF 100 MG IJ SOLR
50.0000 mg | Freq: Four times a day (QID) | INTRAMUSCULAR | Status: DC
  Administered 2017-03-03 – 2017-03-04 (×7): 50 mg via INTRAVENOUS
  Filled 2017-03-02 (×7): qty 2

## 2017-03-02 MED ORDER — PROPOFOL 1000 MG/100ML IV EMUL
5.0000 ug/kg/min | INTRAVENOUS | Status: DC
  Administered 2017-03-02: 20 ug/kg/min via INTRAVENOUS
  Administered 2017-03-02: 70 ug/kg/min via INTRAVENOUS
  Administered 2017-03-03: 40 ug/kg/min via INTRAVENOUS
  Administered 2017-03-03: 60 ug/kg/min via INTRAVENOUS
  Administered 2017-03-03: 50 ug/kg/min via INTRAVENOUS
  Administered 2017-03-03: 60 ug/kg/min via INTRAVENOUS
  Administered 2017-03-03: 45 ug/kg/min via INTRAVENOUS
  Administered 2017-03-03 (×2): 60 ug/kg/min via INTRAVENOUS
  Administered 2017-03-04: 35 ug/kg/min via INTRAVENOUS
  Administered 2017-03-04 (×2): 60 ug/kg/min via INTRAVENOUS
  Administered 2017-03-04: 50 ug/kg/min via INTRAVENOUS
  Administered 2017-03-04 (×2): 60 ug/kg/min via INTRAVENOUS
  Administered 2017-03-05: 50 ug/kg/min via INTRAVENOUS
  Administered 2017-03-05 (×3): 45 ug/kg/min via INTRAVENOUS
  Administered 2017-03-06 (×2): 50 ug/kg/min via INTRAVENOUS
  Filled 2017-03-02 (×12): qty 100
  Filled 2017-03-02: qty 200
  Filled 2017-03-02 (×7): qty 100

## 2017-03-02 NOTE — Progress Notes (Signed)
Took over care of patient from [facility] RN.

## 2017-03-02 NOTE — Progress Notes (Signed)
MEDICATION RELATED CONSULT NOTE  Pharmacy Consult for Electrolyte Monitoring Indication: Hypomagnesemia  Allergies  Allergen Reactions  . Ace Inhibitors   . Compazine [Prochlorperazine Edisylate]   . Lisinopril   . Penicillins     Has patient had a PCN reaction causing immediate rash, facial/tongue/throat swelling, SOB or lightheadedness with hypotension: Unknown Has patient had a PCN reaction causing severe rash involving mucus membranes or skin necrosis: Unknown Has patient had a PCN reaction that required hospitalization: Unknown Has patient had a PCN reaction occurring within the last 10 years: Unknown If all of the above answers are "NO", then may proceed with Cephalosporin use.     Patient Measurements: Height: 5\' 2"  (157.5 cm) Weight: 200 lb 9.9 oz (91 kg) IBW/kg (Calculated) : 50.1  Vital Signs: Temp: 98.1 F (36.7 C) (08/26 1000) Temp Source: Core (Comment) (08/26 0000) BP: 102/64 (08/26 1000) Pulse Rate: 57 (08/26 1000) Intake/Output from previous day: 08/25 0701 - 08/26 0700 In: 857.3 [I.V.:757.3; NG/GT:100] Out: 1265 [Urine:1265] Intake/Output from this shift: Total I/O In: 183 [I.V.:116.8; NG/GT:66.2] Out: 260 [Urine:260]  Labs:  Recent Labs  02/28/17 0431  02/28/17 1026 02/28/17 1754 03/01/17 0606 03/02/17 0741  WBC 47.4*  --   --   --  26.5* 23.9*  HGB 11.0*  --   --   --  10.5* 8.8*  HCT 31.6*  --   --   --  30.3* 25.7*  PLT 376  --   --   --  266 225  CREATININE 1.17*  --  1.03*  --  0.84 0.69  MG  --   < > 1.2* 2.7* 2.0 2.0  PHOS  --   --   --   --  2.5  --   < > = values in this interval not displayed. Estimated Creatinine Clearance: 80.5 mL/min (by C-G formula based on SCr of 0.69 mg/dL).  Sodium (mmol/L)  Date Value  03/02/2017 134 (L)  08/16/2016 141   Potassium (mmol/L)  Date Value  03/02/2017 4.6   Calcium (mg/dL)  Date Value  04/59/9774 8.3 (L)    Assessment: 56 y/o F s/p cardiac arrest and hypothermia protocol with  hypomagnesemia. Due to high risk of recurrent arrhythmia, cardiology wants a goal K of > or = 4 and Mg > or = 2.  Plan:  K = 4.6, Mg = 2.0 this morning. No supplementation needed at this time.  Will recheck K and Magnesium In am.  Demetrius Charity, PharmD Clinical Pharmacist 03/02/2017,11:10 AM

## 2017-03-02 NOTE — Progress Notes (Signed)
Assumed pt. care from [place], rn.

## 2017-03-02 NOTE — Progress Notes (Signed)
PULMONARY / CRITICAL CARE MEDICINE   Name: Savon Cobbs MRN: [id number] DOB: September 08, 1960    ADMISSION DATE:  02/25/2017   PT PROFILE: 32 F suffered OOH arrest with at least 20 mins ACLS/CPR. Initial identified rhythm was VF. Underwent defib X 3 before ROSC. LHC revealed no CAD, severely reduced LV function and moderately elevated LVEDP. TTM 33 degree protocol initiated.  MAJOR EVENTS/TEST RESULTS: 08/21 CT head: no acute findings 08/21 CTA chest: no PE. Diffuse bilateral opacities 08/22 LHC: no CAD, severely reduced LV function and moderately elevated LVEDP 08/22 Echocardiogram:  08/22 Refractory shock despite norepinephrine and phenylephrine infusions. Epinephrine infusion initiated 08/23 Rewarming initiated. Sinus bradyarrhythmias. Amiodarone DC'd. Milrinone initiated. Improved gas exchange. Off of phenylephrine and norepi. Weaning of of epinephrine  INDWELLING DEVICES:: ETT 08/21 >>  L IJ CVL 08/22 >>  R femoral A-line 08/22 >>   MICRO DATA: MRSA PCR 08/22 >> NEG Blood 08/22 >>   ANTIMICROBIALS:     SUBJECTIVE:  Over the last 24 hours no significant improvement. Patient has required increase sedation secondary to ventilator patient dyssynchrony. Is on Precedex, fentanyl infusion with as needed doses of Versed.Has required every 2 hour boluses Family is frustrated spent time counseling husband explaining her situation. Patient still requiring low-dose norepinephrine and is on milrinone per cardiology.  VITAL SIGNS: BP 101/63   Pulse 61   Temp 98.4 F (36.9 C)   Resp 20   Ht 5\' 2"  (1.575 m)   Wt 91 kg (200 lb 9.9 oz)   SpO2 98%   BMI 36.69 kg/m   HEMODYNAMICS:    VENTILATOR SETTINGS: Vent Mode: PCV FiO2 (%):  [55 %-100 %] 55 % Set Rate:  [20 bmp] 20 bmp PEEP:  [8 cmH20] 8 cmH20 Plateau Pressure:  [30 cmH20-33 cmH20] 30 cmH20  INTAKE / OUTPUT: I/O last 3 completed shifts: In: 1574.6 [I.V.:1114.6; NG/GT:310; IV Piggyback:150] Out: 2415 [Urine:2415]  PHYSICAL  EXAMINATION: General:  Intubated Neuro: patient is responsive, communicating appropriately Cardiovascular:  Irreg, brady, no M RHythm is sinus brady with junctional escape beats and PACs Lungs: almost subcutaneous air noted in the right anterior neck Abdomen: soft, diminished to absent BS Ext: edema noted  LABS:  BMET  Recent Labs Lab 02/28/17 1026  02/28/17 2257 03/01/17 0606 03/02/17 0741  NA 133*  --   --  135 134*  K 4.8  < > 5.4* 4.8 4.6  CL 105  --   --  108 105  CO2 18*  --   --  20* 23  BUN 20  --   --  16 16  CREATININE 1.03*  --   --  0.84 0.69  GLUCOSE 204*  --   --  120* 149*  < > = values in this interval not displayed.  Electrolytes  Recent Labs Lab 02/26/17 0340  02/28/17 1026 02/28/17 1754 03/01/17 0606 03/02/17 0741  CALCIUM 8.4*  < > 8.3*  --  8.3* 8.3*  MG 1.8  < > 1.2* 2.7* 2.0 2.0  PHOS 3.6  --   --   --  2.5  --   < > = values in this interval not displayed.  CBC  Recent Labs Lab 02/27/17 0425 02/28/17 0431 03/01/17 0606  WBC 27.2* 47.4* 26.5*  HGB 12.2 11.0* 10.5*  HCT 35.7 31.6* 30.3*  PLT 361 376 266    Coag's  Recent Labs Lab 02/26/17 0340 02/26/17 0612 02/26/17 1237  APTT 30 33 34  INR 1.18 1.29 1.34  Sepsis Markers  Recent Labs Lab 02/28/17 0431  02/28/17 0809 02/28/17 1321 03/01/17 0606 03/01/17 1241 03/02/17 0521  LATICACIDVEN  --   < > 2.6* 2.0*  --  0.6  --   PROCALCITON 2.79  --   --   --  0.81  --  0.54  < > = values in this interval not displayed.  ABG  Recent Labs Lab 02/26/17 0205 02/26/17 1227 02/28/17 0604  PHART 7.29* 7.32* 7.44  PCO2ART 39 35 24*  PO2ART 92 127* 83    Liver Enzymes  Recent Labs Lab 02/26/17 1620  AST 573*  ALT 416*    Cardiac Enzymes  Recent Labs Lab 02/25/17 2240  TROPONINI 0.03*    Glucose  Recent Labs Lab 03/01/17 1601 03/01/17 1950 03/01/17 2106 03/01/17 2354 03/02/17 0354 03/02/17 0755  GLUCAP 152* 142* 117* 115* 139* 133*    CXR:  somewhat improved edema pattern    ASSESSMENT / PLAN:  PULMONARY A: Acute hypoxemic respiratory failure after cardiac arrest Pulmonary edema. atient noted post reintubation to have subcutaneous air and possible pneumopericardium. At the present time no evidence of pneumothorax. Will follow chest x-rays.till requiring low doses of norepinephrine and milrinone.  CARDIOVASCULAR A:  Cardiac arrest  Cardiology following MAP goal changed to > 65 mmHg Wean  norepi as able Milrinone initiated 08/23  RENAL A:   AKI  P:   Monitor BMET intermittently Monitor I/Os Correct electrolytes as indicated  GASTROINTESTINAL A:   Recent diarrheal illness P:   SUP: IV famotidine Consider TF protocol 08/24  HEMATOLOGIC A:   No acute Issues P:  DVT px: SQ heparin Monitor CBC intermittently Transfuse per usual guidelines  INFECTIOUS A:   Leukocytosis - patient white count significantly improved on Vancomycin and Zosyn, blood urine and sputum cultures pending. Will repeat CBC  ENDOCRINE A:   Stress induced hyperglycemia  P:   Cont insulin gtt per protocol  NEUROLOGIC Patient has had difficulty with agitation while on ventilator.Presently on Precedex, fentanyl, will start low-dose Versed infusion secondary to frequent boluses  CCM time 35 mins Tora Kindred, DO

## 2017-03-02 NOTE — Progress Notes (Signed)
Notified MD, Dr. Lonn Georgia, in person about patient's BNP result of 983. MD acknowledged and reviewed patient's chart and condition. No new orders at this time.

## 2017-03-02 NOTE — Progress Notes (Signed)
Progress Note  Patient Name: Miranda [name] Date of Encounter: 03/02/2017  Primary Cardiologist: Rockey Situ  Subjective   Pt intubated, sedated   Inpatient Medications    Scheduled Meds: . chlorhexidine gluconate (MEDLINE KIT)  15 mL Mouth Rinse BID  . sennosides  5 mL Per Tube BID   And  . docusate  100 mg Per Tube BID  . feeding supplement (VITAL HIGH PROTEIN)  1,000 mL Per Tube Q24H  . heparin  5,000 Units Subcutaneous Q8H  . insulin aspart  2-6 Units Subcutaneous Q4H  . insulin glargine  10 Units Subcutaneous Q24H  . mouth rinse  15 mL Mouth Rinse 10 times per day  . sodium chloride flush  10-40 mL Intracatheter Q12H   Continuous Infusions: . dexmedetomidine (PRECEDEX) IV infusion 0.8 mcg/kg/hr (03/02/17 0800)  . dextrose    . epinephrine Stopped (02/28/17 1600)  . famotidine (PEPCID) IV Stopped (03/01/17 2239)  . fentaNYL infusion INTRAVENOUS 60 mcg/hr (03/02/17 1200)  . milrinone 0.5 mcg/kg/min (03/02/17 1228)  . norepinephrine (LEVOPHED) Adult infusion 2 mcg/min (03/02/17 1200)  . piperacillin-tazobactam (ZOSYN)  IV Stopped (03/02/17 1038)  . vancomycin Stopped (03/02/17 6060)   PRN Meds: acetaminophen, atropine, dextrose, levalbuterol, midazolam, naLOXone (NARCAN)  injection, pneumococcal 23 valent vaccine, sodium chloride flush   Vital Signs    Vitals:   03/02/17 1100 03/02/17 1130 03/02/17 1200 03/02/17 1230  BP: 127/76 124/73 (!) 90/56 103/67  Pulse: 60 60 (!) 58 (!) 59  Resp: _0 Temp: 97.9 F (36.6 C) 97.9 F (36.6 C) 97.7 F (36.5 C) 97.7 F (36.5 C)  TempSrc:      SpO2: 99% 99% 99% 98%  Weight:      Height:        Intake/Output Summary (Last 24 hours) at 03/02/17 1258 Last data filed at 03/02/17 1200  Gross per 24 hour  Intake          1181.31 ml  Output             1145 ml  Net            36.31 ml   Net + 5.2 L   Filed Weights   02/25/17 2304 02/26/17 0226 03/02/17 0500  Weight: 195 lb 8.8 oz (88.7 kg) 195 lb 8.8 oz (88.7 kg)  200 lb 9.9 oz (91 kg)    Telemetry    SR - Personally Reviewed  ECG      Physical Exam   GEN: No acute distress. Pt intubated, sedated  Neck: Neck full  Cardiac: RRR, no murmurs, rubs, or gallops.  Respiratory: Clear to auscultation bilaterally. GI: Soft, nontender, non-distended  MS: No edema; No deformity.   Labs    Chemistry Recent Labs Lab 02/26/17 1620  02/28/17 1026  02/28/17 2257 03/01/17 0606 03/02/17 0741  NA 139  < > 133*  --   --  135 134*  K 3.6  < > 4.8  < > 5.4* 4.8 4.6  CL 108  < > 105  --   --  108 105  CO2 19*  < > 18*  --   --  20* 23  GLUCOSE 319*  < > 204*  --   --  120* 149*  BUN 23*  < > 20  --   --  16 16  CREATININE 1.16*  < > 1.03*  --   --  0.84 0.69  CALCIUM 7.9*  < > 8.3*  --   --  8.3* 8.3*  AST 573*  --   --   --   --   --   --   ALT 416*  --   --   --   --   --   --   GFRNONAA 51*  < > 59*  --   --  >60 >60  GFRAA 59*  < > >60  --   --  >60 >60  ANIONGAP 12  < > 10  --   --  7 6  < > = values in this interval not displayed.   Hematology Recent Labs Lab 02/28/17 0431 03/01/17 0606 03/02/17 0741  WBC 47.4* 26.5* 23.9*  RBC 3.61* 3.39* 2.90*  HGB 11.0* 10.5* 8.8*  HCT 31.6* 30.3* 25.7*  MCV 87.3 89.4 88.4  MCH 30.4 30.9 30.2  MCHC 34.8 34.5 34.2  RDW 12.8 13.0 13.1  PLT 376 266 225    Cardiac Enzymes Recent Labs Lab 02/25/17 2240  TROPONINI 0.03*   No results for input(s): TROPIPOC in the last 168 hours.   BNPNo results for input(s): BNP, PROBNP in the last 168 hours.   DDimer No results for input(s): DDIMER in the last 168 hours.   Radiology    Dg Abd 1 View  Result Date: 03/01/2017 CLINICAL DATA:  NG tube placement EXAM: ABDOMEN - 1 VIEW COMPARISON:  02/26/2017 FINDINGS: Enteric tube terminates in the distal gastric body. No evidence of small bowel obstruction. Mild gaseous distention of a loop of colon. IMPRESSION: Enteric tube terminates in the distal gastric body. Electronically Signed   By: Julian Hy M.D.   On: 03/01/2017 15:02   Dg Chest Port 1 View  Result Date: 03/02/2017 CLINICAL DATA:  Acute respiratory failure. On ventilator. Cardiac arrest with ventricular fibrillation. EXAM: PORTABLE CHEST 1 VIEW COMPARISON:  03/01/2017 FINDINGS: Endotracheal tube remains in appropriate position. New nasogastric tube is seen entering the stomach. New right arm PICC line is seen with tip in the region of the cavoatrial junction. Stable mild cardiomegaly. Previously seen pneumomediastinum no longer visualized. No evidence of pneumothorax. Mild diffuse bilateral pulmonary airspace disease shows no significant interval change. IMPRESSION: Stable cardiomegaly and diffuse bilateral airspace disease/ edema. Interval resolution of pneumomediastinum since previous study. No evidence of pneumothorax. Electronically Signed   By: Earle Gell M.D.   On: 03/02/2017 09:38   Dg Chest Port 1 View  Addendum Date: 03/01/2017   ADDENDUM REPORT: 03/01/2017 06:41 ADDENDUM: Study discussed by telephone with NP Dinzy on 03/01/2017 at 0635 hours. She advised the patient had been extubated and was re-intubated at 0430 hours today, and that the re-intubation was traumatic. Therefore, favor that trauma as the origin of the pneumomediastinum and soft tissue gas. I advised that if this gas accumulations seem to be progressing on clinical exam or subsequent radiographs then further evaluation of the extent of trauma would be recommended, such as with Chest CT (IV contrast preferred). Electronically Signed   By: Genevie Ann M.D.   On: 03/01/2017 06:41   Result Date: 03/01/2017 CLINICAL DATA:  56 year old female cardiac arrest with V fib. Intubated. EXAM: PORTABLE CHEST 1 VIEW COMPARISON:  02/28/2017 and earlier. FINDINGS: Portable AP semi upright view at 0542 hours. Endotracheal tube tip projects about 10 mm above the carina. Left IJ central line appears stable. Enteric tube has been removed. New pneumomediastinum and supraclavicular  subcutaneous gas. There is no pneumothorax identified. There is moderate gaseous distension of the visible stomach. Increasing left lower lobe collapse or  consolidation. Continued bilateral hazy pulmonary opacity. No pleural effusion identified. Stable cardiac size and mediastinal contours. IMPRESSION: 1. New pneumomediastinum and supraclavicular subcutaneous emphysema. Etiology is unclear, with no pneumothorax identified. 2. Enteric tube removed. ET tube tip 10 mm above the carina and stable left IJ central line. 3. New left lower lobe collapse or consolidation. Continued diffuse hazy pulmonary opacity which might reflect pulmonary edema or ARDS. 4. Interval gaseous distension of the stomach. Electronically Signed: By: Genevie Ann M.D. On: 03/01/2017 06:19    Cardiac Studies     Patient Profile     56 y.o. female with history of HTN admitted s/p VF  Assessment & Plan    1  VF arrest  Normal coronary arteries.  Etiol  Unclear  ? Primary cardiomyopathy  ? Electrolytes Further care based on recovery  2  Acute systolic CHF  LVEF 10 to 75%  Normal coronary arteries  LVEDP at cath 28   Now coming off of pressors.  On milrinone  Repeat Coox  CVP reported at 11  3  Pulm  Remains intubated  WBC 23  On IVABX  4  Heme  WBC remains elevated 24K  Hgb decreased to 8.8  Follow     Signed, Dorris Carnes, MD  03/02/2017, 12:58 PM

## 2017-03-02 NOTE — Progress Notes (Signed)
Patient with several bradycardia events (HR dropping to 30's or 20's very briefly, never sustained long enough to even prepare atropine let alone administer it) relative to patient's resting heart rate in upper 50s. While interacting with or moving patient would almost certainly cause a bradycardia event, sometimes events occurred with no apparent trigger.   Titrated precedex down to try and improve heart rate and bradycardia events became less frequent. Events that did not appear to have an apparent trigger seemed to decrease on lower dose of precedex but did not cease altogether. Increased fentanyl for agitation and restlessness, no versed pushes required. Levophed titrated down as tolerated by patient. Temperate decreased slightly to 36.1 C so blanket provided to patient (had just had sheet before) and room temperature adjusted. Urine output very low,. With 0-10 mL of output per hour since took over from [facility]. Bladder scanned patient but only 87 mL detected in bladder.

## 2017-03-02 NOTE — Progress Notes (Signed)
[facility] - [place] [facility]   PATIENT NAME: Miranda [name]    MR#:  [id number]  DATE OF BIRTH:  05/03/1961  SUBJECTIVE:   Currently on IV milrinone drip and back on pressors. IV fentanyl +precedex for sedation.    REVIEW OF SYSTEMS:   Review of Systems  Unable to perform ROS: Intubated    DRUG ALLERGIES:   Allergies  Allergen Reactions  . Ace Inhibitors   . Compazine [Prochlorperazine Edisylate]   . Lisinopril   . Penicillins     Has patient had a PCN reaction causing immediate rash, facial/tongue/throat swelling, SOB or lightheadedness with hypotension: Unknown Has patient had a PCN reaction causing severe rash involving mucus membranes or skin necrosis: Unknown Has patient had a PCN reaction that required hospitalization: Unknown Has patient had a PCN reaction occurring within the last 10 years: Unknown If all of the above answers are "NO", then may proceed with Cephalosporin use.     VITALS:  Blood pressure 105/64, pulse (!) 57, temperature (!) 97.2 F (36.2 C), temperature source Core (Comment), resp. rate (!) 21, height 5\' 2"  (1.575 m), weight 91 kg (200 lb 9.9 oz), SpO2 92 %.  PHYSICAL EXAMINATION:   Physical Exam  GENERAL:  56 y.o.-year-old patient lying in the bed with no acute distress. Critically ill EYES: Pupils equal, round, reactive to light and accommodation. No scleral icterus. Extraocular muscles intact.  HEENT: Head atraumatic, normocephalic. Oropharynx and nasopharynx clear. Intubated NECK:  Supple, no jugular venous distention. No thyroid enlargement, no tenderness, NG tube.  LUNGS: Normal breath sounds bilaterally, no wheezing, rales, rhonchi. No use of accessory muscles of respiration.  CARDIOVASCULAR: S1, S2 normal. No murmurs, rubs, or gallops.  ABDOMEN: Soft, nontender, nondistended. Bowel sounds feeble. No organomegaly or mass.  EXTREMITIES: No cyanosis, clubbing or edema b/l.    NEUROLOGIC: Intubated on the  ventilator PSYCHIATRIC:  Sedated and intubated on the ventilator  SKIN: No obvious rash, lesion, or ulcer.   LABORATORY PANEL:  CBC  Recent Labs Lab 03/02/17 0741  WBC 23.9*  HGB 8.8*  HCT 25.7*  PLT 225    Chemistries   Recent Labs Lab 02/26/17 1620  03/02/17 0741  NA 139  < > 134*  K 3.6  < > 4.6  CL 108  < > 105  CO2 19*  < > 23  GLUCOSE 319*  < > 149*  BUN 23*  < > 16  CREATININE 1.16*  < > 0.69  CALCIUM 7.9*  < > 8.3*  MG 2.8*  < > 2.0  AST 573*  --   --   ALT 416*  --   --   < > = values in this interval not displayed. Cardiac Enzymes  Recent Labs Lab 02/25/17 2240  TROPONINI 0.03*   RADIOLOGY:  Dg Abd 1 View  Result Date: 03/01/2017 CLINICAL DATA:  NG tube placement EXAM: ABDOMEN - 1 VIEW COMPARISON:  02/26/2017 FINDINGS: Enteric tube terminates in the distal gastric body. No evidence of small bowel obstruction. Mild gaseous distention of a loop of colon. IMPRESSION: Enteric tube terminates in the distal gastric body. Electronically Signed   By: Charline Bills M.D.   On: 03/01/2017 15:02   Dg Chest Port 1 View  Result Date: 03/02/2017 CLINICAL DATA:  Acute respiratory failure. On ventilator. Cardiac arrest with ventricular fibrillation. EXAM: PORTABLE CHEST 1 VIEW COMPARISON:  03/01/2017 FINDINGS: Endotracheal tube remains in appropriate position. New nasogastric tube is seen entering the stomach. New  right arm PICC line is seen with tip in the region of the cavoatrial junction. Stable mild cardiomegaly. Previously seen pneumomediastinum no longer visualized. No evidence of pneumothorax. Mild diffuse bilateral pulmonary airspace disease shows no significant interval change. IMPRESSION: Stable cardiomegaly and diffuse bilateral airspace disease/ edema. Interval resolution of pneumomediastinum since previous study. No evidence of pneumothorax. Electronically Signed   By: Myles Rosenthal M.D.   On: 03/02/2017 09:38   Dg Chest Port 1 View  Addendum Date: 03/01/2017    ADDENDUM REPORT: 03/01/2017 06:41 ADDENDUM: Study discussed by telephone with NP Dinzy on 03/01/2017 at 0635 hours. She advised the patient had been extubated and was re-intubated at 0430 hours today, and that the re-intubation was traumatic. Therefore, favor that trauma as the origin of the pneumomediastinum and soft tissue gas. I advised that if this gas accumulations seem to be progressing on clinical exam or subsequent radiographs then further evaluation of the extent of trauma would be recommended, such as with Chest CT (IV contrast preferred). Electronically Signed   By: Odessa Fleming M.D.   On: 03/01/2017 06:41   Result Date: 03/01/2017 CLINICAL DATA:  56 year old female cardiac arrest with V fib. Intubated. EXAM: PORTABLE CHEST 1 VIEW COMPARISON:  02/28/2017 and earlier. FINDINGS: Portable AP semi upright view at 0542 hours. Endotracheal tube tip projects about 10 mm above the carina. Left IJ central line appears stable. Enteric tube has been removed. New pneumomediastinum and supraclavicular subcutaneous gas. There is no pneumothorax identified. There is moderate gaseous distension of the visible stomach. Increasing left lower lobe collapse or consolidation. Continued bilateral hazy pulmonary opacity. No pleural effusion identified. Stable cardiac size and mediastinal contours. IMPRESSION: 1. New pneumomediastinum and supraclavicular subcutaneous emphysema. Etiology is unclear, with no pneumothorax identified. 2. Enteric tube removed. ET tube tip 10 mm above the carina and stable left IJ central line. 3. New left lower lobe collapse or consolidation. Continued diffuse hazy pulmonary opacity which might reflect pulmonary edema or ARDS. 4. Interval gaseous distension of the stomach. Electronically Signed: By: Odessa Fleming M.D. On: 03/01/2017 06:19   ASSESSMENT AND PLAN:   Ms. Luepke reportedly had diarrhea over the last 2-3 days for which she was taking up to 12 Imodium per day. She was trying to stay well  hydrated and had also stopped taking her blood pressure medications on account of her GI illness. Around 9 PM, Ms. Flessner's husband found the patient seated on the toilet. She attempted to stand up but passed out.  * Cardiac arrest due to ventricular fibrillation -Inciting event is still somewhat unclear, though given recent diarrhea and significant electrolyte abnormalities  Vs suspect that she may have had a primary arrhythmogenic event. -Patient underwent urgent Catheterization last night showed no significant coronary artery disease. 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 -On IV levophed,Milrinone,fentanyl -Patient was  on hypothermia protocol -Appreciate ICU attending help -CT chest negative for PE  *Hypokalemia being repeated  *Acute renal failure secondary to #1 -making good urine -Creatinine stable  *Febrile illness -Low-grade fever, tachycardia elevated white count of 40,000 -Blood cultures so far negative -Empiric IV vancomycin and Zosyn  *Metabolic acidosis secondary to #1  *Hyperglycemia -Patient currently off insulin drip  * Nutrition -started on Tube feeding  *DVT prophylaxis subcutaneous heparin  .Discussed with Dr. Lonn Georgia  Patient is critically ill. Family understands.  Case discussed with  Care Management/Social Worker. Management plans discussed with the  family and they are in agreement.  CODE STATUS: Full  DVT Prophylaxis: Subcutaneous heparin  TOTAL CRITICAL TIME TAKING CARE OF THIS PATIENT: 30 minutes.  >50% time spent on counselling and coordination of care  Note: This dictation was prepared with Dragon dictation along with smaller phrase technology. Any transcriptional errors that result from this process are unintentional.  Sophia Cubero M.D on 03/02/2017 at 3:40 PM  Between 7am to 6pm -  Pager - [phone]  After 6pm go to [email or web address] - Social research officer, government  [facility]  Office  [phone]  CC: Primary care physician; Merwyn Katos, MD

## 2017-03-02 NOTE — Progress Notes (Signed)
Nutrition Follow-up  DOCUMENTATION CODES:   Obesity unspecified  INTERVENTION:  Advance to goal tube feed regimen of Vital High Protein at 50 ml/hr via OGT. Provides 1200 kcal, 105 grams of protein, 1008 ml H2O daily. Initiate PEPuP as patient is now advanced past trickle TF regimen.  NUTRITION DIAGNOSIS:   Inadequate oral intake related to inability to eat (pt ventilated and sedated) as evidenced by NPO status.  Ongoing - addressing with TF.  GOAL:   Provide needs based on ASPEN/SCCM guidelines  Progressing - will meet with advancement to goal TF rate today.  MONITOR:   Vent status, Labs, Weight trends, TF tolerance, I & O's  REASON FOR ASSESSMENT:   Ventilator    ASSESSMENT:   56 year old female with PMHx of HTN, migraines, edema to bilateral legs. Patient had diarrhea for 2-3 days PTA and was taking up to 12 imodium per day, staying well-hydrated, and stopped taking antihypertensives. She suffered V-fib arrest at home, was intubated in the field after ROSC.  -Re-warming was initiated on 8/23. -On 8/24 patient was initiated on trickle feeds (Vital High Protein at 20 ml/hr). -On 8/25 patient was extubated at 0430. Original OGT placed 8/21 was removed with extubation. Patient subsequently required re-intubation on 8/25 at 0800 in setting of hypoxia and respiratory failure. New OGT was placed on 8/25 at 1145. -Also on 8/25 left IJ migrated out and was removed. Triple lumen PICC was placed on 8/25.  Access: 16 Fr. OGT placed 8/25 (external marking 65 cm); confirmed to terminate in distal gastric body per abdominal x-ray on 8/25  MAP: 73-115 today  TF: patient tolerating trickle tube feed rate of Vital High Protein at 20 ml/hr  Patient is currently intubated on ventilator support MV: 10.6 L/min Temp (24hrs), Avg:99.2 F (37.3 C), Min:98.1 F (36.7 C), Max:100.2 F (37.9 C)  Propofol: N/A  Medications reviewed and include: Senokot, Colace, Novolog 2-6 units Q4hrs,  Lantus 10 units Q24hrs, Precedex gtt, famotidine, fentanyl gtt, milrinone, Levophed gtt (currently at 1.9 ml/hr), Zosyn, vancomycin. Epinephrine was discontinued 8/24 at 1600.  Labs reviewed: CBG 95-152 past 24 hrs, Sodium 134.  I/O 0700 8/25 to 0700 8/26: 1265 ml UOP (0.6 ml/kg/hr)  Weight trend: 91 kg on 8/26 (+2.3 kg from 8/21)  Nutrition-Focused physical exam completed. Findings are no fat depletion, no muscle depletion, and mild edema.   Discussed nutrition plan of care with MD. Molli Knock to advance to patient's goal TF regimen today.  Discussed with RN. Patient has hypoactive bowel sounds, but has not had two bowel movements (first was smear, second was somewhat larger).  Diet Order:     Skin:  Reviewed, no issues  Last BM:  03/02/2017 - small type 7  Height:   Ht Readings from Last 1 Encounters:  02/26/17 5\' 2"  (1.575 m)    Weight:   Wt Readings from Last 1 Encounters:  03/02/17 200 lb 9.9 oz (91 kg)    Ideal Body Weight:  50 kg  BMI:  Body mass index is 36.69 kg/m.  Estimated Nutritional Needs:   Kcal:  574-7340 (11-14 kcal/kg)  Protein:  >/= 100 grams (>/= 2 grams/kg IBW)  Fluid:  1.5-1.75 L/day (30-35 ml/kg IBW)  EDUCATION NEEDS:   No education needs identified at this time  Helane Rima, MS, RD, LDN Pager: [phone] After Hours Pager: [phone]

## 2017-03-02 NOTE — Progress Notes (Addendum)
0530 patient husband to RN desk, starts discussion regarding patient POC stating agressively " something needs to be done, Fentanyl has all kinds of side effects like anxiety" this RN explained to Mr. Mangram that we could discuss plan of care with MD further but it was not reasonable for [place] to extubate the patient given current condition.  Mr. Tardif pointed finger at this RN and stated " we will see about that". NP [name] notified of family concerns. Nursing supervisor updated on situation, will continue to assess for changes/need.

## 2017-03-02 NOTE — Progress Notes (Signed)
At 0715 pt was agitated, restless, tachypneic, BP very elevated.  Gagging and coughing frequently. Bradycardia to 30's when gagging. Dr Lonn Georgia at bedside. Sedation re-achieved with Fentanyl and precedex gtts. Versed gtt is not needed at present. Fentanyl gtt currently at and precedex at 0.57mcg.  Levophed 2 mcg. Milrinone at 0.5 mcg. She will brady down to 30's with oral care, suctioning and position changes. No loss of p waves noted. Dr Tenny Craw here for cardiology and aware of bradycardia with positioning,oral care and ett suctioning.  BNP pending. UOP amber and decreased.Dr Lonn Georgia aware. Dr Lonn Georgia spent time with pt husband explaining rationale for all care and her critical state. Son is currently at bedside. Pt husband has gone home to sleep.  Report given to Adron Bene RN.

## 2017-03-03 ENCOUNTER — Inpatient Hospital Stay (HOSPITAL_COMMUNITY)
Admit: 2017-03-03 | Discharge: 2017-03-03 | Disposition: A | Discharge status: Home / Self Care | Payer: BC Managed Care – PPO | Attending: Internal Medicine | Admitting: Internal Medicine

## 2017-03-03 ENCOUNTER — Inpatient Hospital Stay: Payer: BC Managed Care – PPO

## 2017-03-03 DIAGNOSIS — I351 Nonrheumatic aortic (valve) insufficiency: Secondary | ICD-10-CM

## 2017-03-03 LAB — BLOOD GAS, ARTERIAL
Acid-base deficit: 4.3 mmol/L — ABNORMAL HIGH (ref 0.0–2.0)
Bicarbonate: 18.1 mmol/L — ABNORMAL LOW (ref 20.0–28.0)
FIO2: 0.45
HI FREQUENCY JET VENT RATE: 20
O2 SAT: 96.4 %
PEEP/CPAP: 8 cmH2O
PO2 ART: 81 mmHg — AB (ref 83.0–108.0)
PRESSURE CONTROL: 25 cmH2O
Patient temperature: 37
pCO2 arterial: 26 mmHg — ABNORMAL LOW (ref 32.0–48.0)
pH, Arterial: 7.45 (ref 7.350–7.450)

## 2017-03-03 LAB — BASIC METABOLIC PANEL
ANION GAP: 6 (ref 5–15)
BUN: 30 mg/dL — ABNORMAL HIGH (ref 6–20)
CALCIUM: 8.1 mg/dL — AB (ref 8.9–10.3)
CO2: 22 mmol/L (ref 22–32)
Chloride: 108 mmol/L (ref 101–111)
Creatinine, Ser: 0.72 mg/dL (ref 0.44–1.00)
GLUCOSE: 149 mg/dL — AB (ref 65–99)
POTASSIUM: 4.1 mmol/L (ref 3.5–5.1)
Sodium: 136 mmol/L (ref 135–145)

## 2017-03-03 LAB — CBC WITH DIFFERENTIAL/PLATELET
BASOS ABS: 0 10*3/uL (ref 0–0.1)
BASOS PCT: 0 %
EOS PCT: 0 %
Eosinophils Absolute: 0 10*3/uL (ref 0–0.7)
HEMATOCRIT: 24.6 % — AB (ref 35.0–47.0)
Hemoglobin: 8.5 g/dL — ABNORMAL LOW (ref 12.0–16.0)
LYMPHS PCT: 6 %
Lymphs Abs: 1.2 10*3/uL (ref 1.0–3.6)
MCH: 30.9 pg (ref 26.0–34.0)
MCHC: 34.4 g/dL (ref 32.0–36.0)
MCV: 89.8 fL (ref 80.0–100.0)
Monocytes Absolute: 0.7 10*3/uL (ref 0.2–0.9)
Monocytes Relative: 3 %
NEUTROS ABS: 17.9 10*3/uL — AB (ref 1.4–6.5)
Neutrophils Relative %: 91 %
PLATELETS: 191 10*3/uL (ref 150–440)
RBC: 2.74 MIL/uL — AB (ref 3.80–5.20)
RDW: 12.9 % (ref 11.5–14.5)
WBC: 19.7 10*3/uL — AB (ref 3.6–11.0)

## 2017-03-03 LAB — CULTURE, BLOOD (ROUTINE X 2)
CULTURE: NO GROWTH
CULTURE: NO GROWTH
SPECIAL REQUESTS: ADEQUATE
Special Requests: ADEQUATE

## 2017-03-03 LAB — GLUCOSE, CAPILLARY
GLUCOSE-CAPILLARY: 146 mg/dL — AB (ref 65–99)
GLUCOSE-CAPILLARY: 123 mg/dL — AB (ref 65–99)
GLUCOSE-CAPILLARY: 105 mg/dL — AB (ref 65–99)
Glucose-Capillary: 119 mg/dL — ABNORMAL HIGH (ref 65–99)
Glucose-Capillary: 141 mg/dL — ABNORMAL HIGH (ref 65–99)
Glucose-Capillary: 154 mg/dL — ABNORMAL HIGH (ref 65–99)
Glucose-Capillary: 160 mg/dL — ABNORMAL HIGH (ref 65–99)

## 2017-03-03 LAB — PROCALCITONIN: Procalcitonin: 0.69 ng/mL

## 2017-03-03 LAB — MAGNESIUM: MAGNESIUM: 1.9 mg/dL (ref 1.7–2.4)

## 2017-03-03 LAB — ECHOCARDIOGRAM COMPLETE
HEIGHTINCHES: 62 in
WEIGHTICAEL: 3273.39 [oz_av]

## 2017-03-03 MED ORDER — VITAL HIGH PROTEIN PO LIQD
1000.0000 mL | ORAL | Status: DC
  Administered 2017-03-03: 18:00:00
  Administered 2017-03-03: 1000 mL
  Administered 2017-03-04 (×9)
  Administered 2017-03-04: 1000 mL
  Administered 2017-03-04 (×5)
  Administered 2017-03-05: 1000 mL

## 2017-03-03 MED ORDER — DEXTROSE 5 % IV SOLN
2.0000 g | Freq: Every day | INTRAVENOUS | Status: AC
  Administered 2017-03-03 – 2017-03-07 (×5): 2 g via INTRAVENOUS
  Filled 2017-03-03 (×5): qty 2

## 2017-03-03 MED ORDER — ADULT MULTIVITAMIN LIQUID CH
15.0000 mL | Freq: Every day | ORAL | Status: DC
  Administered 2017-03-03 – 2017-03-10 (×8): 15 mL
  Filled 2017-03-03 (×9): qty 15

## 2017-03-03 MED ORDER — PRO-STAT SUGAR FREE PO LIQD
60.0000 mL | Freq: Two times a day (BID) | ORAL | Status: DC
  Administered 2017-03-03 – 2017-03-10 (×15): 60 mL

## 2017-03-03 MED ORDER — ALPRAZOLAM 1 MG PO TABS
1.0000 mg | ORAL_TABLET | Freq: Three times a day (TID) | ORAL | Status: DC
  Administered 2017-03-03 – 2017-03-05 (×7): 1 mg via ORAL
  Filled 2017-03-03 (×7): qty 1

## 2017-03-03 MED ORDER — POTASSIUM CHLORIDE 20 MEQ/15ML (10%) PO SOLN
40.0000 meq | Freq: Once | ORAL | Status: AC
  Administered 2017-03-03: 40 meq
  Filled 2017-03-03: qty 30

## 2017-03-03 MED ORDER — DOCUSATE SODIUM 50 MG/5ML PO LIQD
100.0000 mg | Freq: Two times a day (BID) | ORAL | Status: DC | PRN
  Filled 2017-03-03: qty 10

## 2017-03-03 MED ORDER — MAGNESIUM SULFATE 4 GM/100ML IV SOLN
4.0000 g | Freq: Once | INTRAVENOUS | Status: AC
  Administered 2017-03-03: 4 g via INTRAVENOUS
  Filled 2017-03-03: qty 100

## 2017-03-03 MED ORDER — SENNOSIDES 8.8 MG/5ML PO SYRP
5.0000 mL | ORAL_SOLUTION | Freq: Two times a day (BID) | ORAL | Status: DC | PRN
  Filled 2017-03-03: qty 5

## 2017-03-03 MED ORDER — VANCOMYCIN HCL IN DEXTROSE 1-5 GM/200ML-% IV SOLN
1000.0000 mg | Freq: Two times a day (BID) | INTRAVENOUS | Status: DC
  Filled 2017-03-03 (×2): qty 200

## 2017-03-03 MED ORDER — FUROSEMIDE 10 MG/ML IJ SOLN
20.0000 mg | INTRAMUSCULAR | Status: AC
  Administered 2017-03-03: 20 mg via INTRAVENOUS
  Filled 2017-03-03: qty 2

## 2017-03-03 NOTE — Progress Notes (Signed)
*  PRELIMINARY RESULTS* Echocardiogram 2D Echocardiogram has been performed.  Cristela Blue 03/03/2017, 1:30 PM

## 2017-03-03 NOTE — Progress Notes (Signed)
While rounding, CH made a follow up visit. Pt is intubated and resting. Husband is bedside. Husband states it was a hard weekend, and did not want to relive it. He also stated they are in financial trouble and is worried as to how this will all get taken care of. CH provided the ministry of empathetic listening and prayer. CH will continue to monitor this family.    03/03/17 1600  Clinical Encounter Type  Visited With Patient;Patient and family together;Health care provider  Visit Type Follow-up;Spiritual support  Consult/Referral To Chaplain  Spiritual Encounters  Spiritual Needs Emotional

## 2017-03-03 NOTE — Progress Notes (Signed)
MEDICATION RELATED CONSULT NOTE  Pharmacy Consult for Electrolyte Monitoring Indication: Hypomagnesemia  Allergies  Allergen Reactions  . Ace Inhibitors   . Compazine [Prochlorperazine Edisylate]   . Lisinopril   . Penicillins     Has patient had a PCN reaction causing immediate rash, facial/tongue/throat swelling, SOB or lightheadedness with hypotension: Unknown Has patient had a PCN reaction causing severe rash involving mucus membranes or skin necrosis: Unknown Has patient had a PCN reaction that required hospitalization: Unknown Has patient had a PCN reaction occurring within the last 10 years: Unknown If all of the above answers are "NO", then may proceed with Cephalosporin use.     Patient Measurements: Height: 5\' 2"  (157.5 cm) Weight: 204 lb 9.4 oz (92.8 kg) IBW/kg (Calculated) : 50.1  Vital Signs: Temp: 98.4 F (36.9 C) (08/27 0645) Temp Source: Core (Comment) (08/27 0400) BP: 110/57 (08/27 0600) Pulse Rate: 66 (08/27 0645) Intake/Output from previous day: 08/26 0701 - 08/27 0700 In: 2397.4 [I.V.:1100.1; NG/GT:1097.3; IV Piggyback:200] Out: 1485 [Urine:1485] Intake/Output from this shift: No intake/output data recorded.  Labs:  Recent Labs  03/01/17 0606 03/02/17 0741 03/02/17 2059 03/03/17 0445  WBC 26.5* 23.9*  --  19.7*  HGB 10.5* 8.8*  --  8.5*  HCT 30.3* 25.7*  --  24.6*  PLT 266 225  --  191  CREATININE 0.84 0.69 0.76 0.72  MG 2.0 2.0 1.9 1.9  PHOS 2.5  --   --   --    Estimated Creatinine Clearance: 81.3 mL/min (by C-G formula based on SCr of 0.72 mg/dL).  Sodium (mmol/L)  Date Value  03/03/2017 136  08/16/2016 141   Potassium (mmol/L)  Date Value  03/03/2017 4.1   Calcium (mg/dL)  Date Value  18/29/9371 8.1 (L)    Assessment: 56 y/o F s/p cardiac arrest and hypothermia protocol with hypomagnesemia. Due to high risk of recurrent arrhythmia, cardiology wants a goal K of > or = 4 and Mg > or = 2.  Plan:  Will replace electrolytes  with KCl 40 meq via tube and magnesium sulfate 1 g iv once. Will f/u AM labs.   Valentina Gu, PharmD Clinical Pharmacist 03/03/2017,8:46 AM

## 2017-03-03 NOTE — Progress Notes (Signed)
Nutrition Follow-up  DOCUMENTATION CODES:   Obesity unspecified  INTERVENTION:  Recommend new goal TF regimen of Vital High Protein at 20 ml/hr (480 ml goal daily volume) + Pro-Stat 60 ml BID via OGT. Goal regimen provides 880 kcal, 102 grams of protein, 403 ml H2O. With current propofol rate provides 1746 kcal daily. Continue PEPuP.  Recommend liquid multivitamin with minerals per tube daily as new goal TF regimen does not meet 100% RDIs.  NUTRITION DIAGNOSIS:   Inadequate oral intake related to inability to eat (pt ventilated and sedated) as evidenced by NPO status.  Ongoing - addressing with TF regimen.  GOAL:   Provide needs based on ASPEN/SCCM guidelines  Met.  MONITOR:   Vent status, Labs, Weight trends, TF tolerance, I & O's  REASON FOR ASSESSMENT:   Ventilator    ASSESSMENT:   56 year old female with PMHx of HTN, migraines, edema to bilateral legs. Patient had diarrhea for 2-3 days PTA and was taking up to 12 imodium per day, staying well-hydrated, and stopped taking antihypertensives. She suffered V-fib arrest at home, was intubated in the field after ROSC.  Patient discussed in rounds. Overnight she transitioned from Precedex to propofol. HR improved after transition to propofol.  Access: 16 Fr. OGT placed 8/25 (external marking 65 cm); confirmed to terminate in distal gastric body per abdominal x-ray on 8/25  TF: tolerating Vital High Protein at 50 ml/hr  Patient is currently intubated on ventilator support MV: 11 L/min Temp (24hrs), Avg:97.1 F (36.2 C), Min:95.2 F (35.1 C), Max:98.8 F (37.1 C)  Propofol: 32.8 ml/hr (866 kcal daily)  Medications reviewed and include: Senokot, Colace, Novolog 2-6 units Q4hrs, Lantus 10 units Q24hrs, potassium chloride 40 mEq once today, famotidine, fentanyl gtt, magnesium sulfate 4 grams once today, milirinone, Levophed, Zosyn, propofol gtt, vancomycin.  Labs reviewed: CBG 112-160 past 24 hrs, BUN 30.  Diet Order:      Skin:  Reviewed, no issues  Last BM:  03/02/2017 - small type 7  Height:   Ht Readings from Last 1 Encounters:  02/26/17 '5\' 2"'  (1.575 m)    Weight:   Wt Readings from Last 1 Encounters:  03/03/17 204 lb 9.4 oz (92.8 kg)    Ideal Body Weight:  50 kg  BMI:  Body mass index is 37.42 kg/m.  Estimated Nutritional Needs:   Kcal:  592-9244 (11-14 kcal/kg)  Protein:  >/= 100 grams (>/= 2 grams/kg IBW)  Fluid:  1.5-1.75 L/day (30-35 ml/kg IBW)  EDUCATION NEEDS:   No education needs identified at this time  Willey Blade, MS, RD, LDN Pager: [phone] After Hours Pager: [phone]

## 2017-03-03 NOTE — Progress Notes (Signed)
Pt. Transitioned from precedex to propofol- pt.'s HR improved from 40's-50's to 50's to 70's. Pt.'s UOP increased after changing the pt.'s foley at 0100.  Total for shift: 1,151.  Pt. Remains on levo, milrinone, propofol and fentanyl. Report given to [facility].

## 2017-03-03 NOTE — Progress Notes (Signed)
MEDICATION RELATED CONSULT NOTE  Pharmacy Consult for Electrolyte Monitoring Indication: Hypomagnesemia  Allergies  Allergen Reactions  . Ace Inhibitors   . Compazine [Prochlorperazine Edisylate]   . Lisinopril   . Penicillins     Has patient had a PCN reaction causing immediate rash, facial/tongue/throat swelling, SOB or lightheadedness with hypotension: Unknown Has patient had a PCN reaction causing severe rash involving mucus membranes or skin necrosis: Unknown Has patient had a PCN reaction that required hospitalization: Unknown Has patient had a PCN reaction occurring within the last 10 years: Unknown If all of the above answers are "NO", then may proceed with Cephalosporin use.     Patient Measurements: Height: 5\' 2"  (157.5 cm) Weight: 204 lb 9.4 oz (92.8 kg) IBW/kg (Calculated) : 50.1  Vital Signs: Temp: 98.8 F (37.1 C) (08/27 1100) Temp Source: Core (Comment) (08/27 0900) BP: 115/56 (08/27 1100) Pulse Rate: 67 (08/27 1100) Intake/Output from previous day: 08/26 0701 - 08/27 0700 In: 2397.4 [I.V.:1100.1; NG/GT:1097.3; IV Piggyback:200] Out: 1485 [Urine:1485] Intake/Output from this shift: Total I/O In: 286.8 [I.V.:161.8; NG/GT:125] Out: 550 [Emesis/NG output:550]  Labs:  Recent Labs  03/01/17 0606 03/02/17 0741 03/02/17 2059 03/03/17 0445  WBC 26.5* 23.9*  --  19.7*  HGB 10.5* 8.8*  --  8.5*  HCT 30.3* 25.7*  --  24.6*  PLT 266 225  --  191  CREATININE 0.84 0.69 0.76 0.72  MG 2.0 2.0 1.9 1.9  PHOS 2.5  --   --   --    Estimated Creatinine Clearance: 81.3 mL/min (by C-G formula based on SCr of 0.72 mg/dL).  Sodium (mmol/L)  Date Value  03/03/2017 136  08/16/2016 141   Potassium (mmol/L)  Date Value  03/03/2017 4.1   Calcium (mg/dL)  Date Value  67/59/1638 8.1 (L)    Assessment: 56 y/o F s/p cardiac arrest and hypothermia protocol with hypomagnesemia. Due to high risk of recurrent arrhythmia, cardiology wants a goal K of > or = 4 and Mg >  or = 2.  Plan:  Will replace electrolytes with KCl 40 meq via tube and magnesium sulfate 1 g iv once. Will f/u AM labs.   Valentina Gu, PharmD Clinical Pharmacist 03/03/2017,12:35 PM

## 2017-03-03 NOTE — Progress Notes (Signed)
[facility] [place] [facility]   PATIENT NAME: Miranda [name]    MR#:  [id number]  DATE OF BIRTH:  26-Feb-1961  SUBJECTIVE:   Intubated,sedated and on the vent  REVIEW OF SYSTEMS:   Review of Systems  Unable to perform ROS: Intubated    DRUG ALLERGIES:   Allergies  Allergen Reactions  . Ace Inhibitors   . Compazine [Prochlorperazine Edisylate]   . Lisinopril   . Penicillins     Has patient had a PCN reaction causing immediate rash, facial/tongue/throat swelling, SOB or lightheadedness with hypotension: Unknown Has patient had a PCN reaction causing severe rash involving mucus membranes or skin necrosis: Unknown Has patient had a PCN reaction that required hospitalization: Unknown Has patient had a PCN reaction occurring within the last 10 years: Unknown If all of the above answers are "NO", then may proceed with Cephalosporin use.     VITALS:  Blood pressure (!) 115/56, pulse 67, temperature 98.8 F (37.1 C), resp. rate (!) 22, height 5\' 2"  (1.575 m), weight 92.8 kg (204 lb 9.4 oz), SpO2 96 %.  PHYSICAL EXAMINATION:   Physical Exam  GENERAL:  56 y.o.-year-old patient lying in the bed with no acute distress. Critically ill EYES: Pupils equal, round, reactive to light and accommodation. No scleral icterus. Extraocular muscles intact.  HEENT: Head atraumatic, normocephalic. Oropharynx and nasopharynx clear. Intubated NECK:  Supple, no jugular venous distention. No thyroid enlargement, no tenderness, NG tube.  LUNGS: Normal breath sounds bilaterally, no wheezing, rales, rhonchi. No use of accessory muscles of respiration.  CARDIOVASCULAR: S1, S2 normal. No murmurs, rubs, or gallops.  ABDOMEN: Soft, nontender, nondistended. Bowel sounds feeble. No organomegaly or mass.  EXTREMITIES: No cyanosis, clubbing or edema b/l.    NEUROLOGIC: Intubated on the ventilator PSYCHIATRIC:  Sedated and intubated on the ventilator  SKIN: No obvious rash, lesion, or  ulcer.   LABORATORY PANEL:  CBC  Recent Labs Lab 03/03/17 0445  WBC 19.7*  HGB 8.5*  HCT 24.6*  PLT 191    Chemistries   Recent Labs Lab 02/26/17 1620  03/03/17 0445  NA 139  < > 136  K 3.6  < > 4.1  CL 108  < > 108  CO2 19*  < > 22  GLUCOSE 319*  < > 149*  BUN 23*  < > 30*  CREATININE 1.16*  < > 0.72  CALCIUM 7.9*  < > 8.1*  MG 2.8*  < > 1.9  AST 573*  --   --   ALT 416*  --   --   < > = values in this interval not displayed. Cardiac Enzymes  Recent Labs Lab 02/25/17 2240  TROPONINI 0.03*   RADIOLOGY:  Dg Chest Port 1 View  Result Date: 03/03/2017 CLINICAL DATA:  Respiratory failure. EXAM: PORTABLE CHEST 1 VIEW COMPARISON:  03/02/2017. FINDINGS: Endotracheal tube, NG tube, right PICC line stable position. Heart size stable. Diffuse bilateral pulmonary infiltrates. Small left pleural effusion. No pneumothorax . IMPRESSION: 1.  Lines and tubes in stable position. 2. Diffuse bilateral pulmonary infiltrates/ edema. Small left pleural effusion. No interim change. Electronically Signed   By: Maisie Fus  Register   On: 03/03/2017 06:37   Dg Chest Port 1 View  Result Date: 03/02/2017 CLINICAL DATA:  Acute respiratory failure. On ventilator. Cardiac arrest with ventricular fibrillation. EXAM: PORTABLE CHEST 1 VIEW COMPARISON:  03/01/2017 FINDINGS: Endotracheal tube remains in appropriate position. New nasogastric tube is seen entering the stomach. New right arm PICC  line is seen with tip in the region of the cavoatrial junction. Stable mild cardiomegaly. Previously seen pneumomediastinum no longer visualized. No evidence of pneumothorax. Mild diffuse bilateral pulmonary airspace disease shows no significant interval change. IMPRESSION: Stable cardiomegaly and diffuse bilateral airspace disease/ edema. Interval resolution of pneumomediastinum since previous study. No evidence of pneumothorax. Electronically Signed   By: Myles Rosenthal M.D.   On: 03/02/2017 09:38   ASSESSMENT AND PLAN:    Ms. Mattera reportedly had diarrhea over the last 2-3 days for which she was taking up to 12 Imodium per day. She was trying to stay well hydrated and had also stopped taking her blood pressure medications on account of her GI illness. Around 9 PM, Ms. Dura's husband found the patient seated on the toilet. She attempted to stand up but passed out.  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 -repeat echo today -On IV levophed,Milrinone,fentanyl and propofol -Patient was  on hypothermia protocol -Appreciate ICU attending help -CT chest negative for PE -CXR shows pulmonary edema. Received Lasix x1 today  *Acute renal failure secondary to #1 -making good urine -Creatinine stable  *Febrile illness -Low-grade fever, tachycardia elevated white count of 40,000--- 19K -Blood cultures so far negative -Empiric IV vancomycin and Zosyn  *Metabolic acidosis secondary to #1  *Hyperglycemia -Patient currently off insulin drip  * Nutrition -started on Tube feeding  *DVT prophylaxis subcutaneous heparin  .Discussed with Dr. Belia Heman Patient is critically ill. Family understands.  Case discussed with Care Management/Social Worker. Management plans discussed with the  family and they are in agreement.  CODE STATUS: Full  DVT Prophylaxis: Subcutaneous heparin  TOTAL CRITICAL TIME TAKING CARE OF THIS PATIENT: 30 minutes.  >50% time spent on counselling and coordination of care  Note: This dictation was prepared with Dragon dictation along with smaller phrase technology. Any transcriptional errors that result from this process are unintentional.  Linley Moskal M.D on 03/03/2017 at 12:11  PM  Between 7am to 6pm - Pager - [phone]  After 6pm go to [email or web address] - Social research officer, government  [facility]  Office  [phone]  CC: Primary care physician; Merwyn Katos, MD

## 2017-03-03 NOTE — Progress Notes (Signed)
Pharmacy Antibiotic Note  Miranda [name] is a 56 y.o. female admitted on 02/25/2017 with cardiac arrest now with significant leukocytosis starting on empiric antibiotics per CCM.  Pharmacy has been consulted for vancomycin and Zosyn dosing.  Plan: DW 66kg  Vd 46L kei 0.049 hr-1  T1/2 14 hours Vancomycin 1 gram q 18 hours ordered with stacked dosing. Level before 5th dose. Goal trough 15-20.  Zosyn 3.375 grams q 8 hours ordered.  Height: 5\' 2"  (157.5 cm) Weight: 200 lb 9.9 oz (91 kg) IBW/kg (Calculated) : 50.1  Temp (24hrs), Avg:97.6 F (36.4 C), Min:97 F (36.1 C), Max:99.1 F (37.3 C)   Recent Labs Lab 02/26/17 0210  02/26/17 0340  02/27/17 0425  02/28/17 0431 02/28/17 0625 02/28/17 0809 02/28/17 1026 02/28/17 1321 03/01/17 0606 03/01/17 1241 03/02/17 0741 03/02/17 2059 03/02/17 2338  WBC  --   < > 26.1*  --  27.2*  --  47.4*  --   --   --   --  26.5*  --  23.9*  --   --   CREATININE  --   < > 1.08*  < > 0.97  < > 1.17*  --   --  1.03*  --  0.84  --  0.69 0.76  --   LATICACIDVEN 5.2*  --   --   --   --   --   --  2.9* 2.6*  --  2.0*  --  0.6  --   --   --   VANCOTROUGH  --   --   --   --   --   --   --   --   --   --   --   --   --   --   --  12*  < > = values in this interval not displayed.  Estimated Creatinine Clearance: 80.5 mL/min (by C-G formula based on SCr of 0.76 mg/dL).    Allergies  Allergen Reactions  . Ace Inhibitors   . Compazine [Prochlorperazine Edisylate]   . Lisinopril   . Penicillins     Has patient had a PCN reaction causing immediate rash, facial/tongue/throat swelling, SOB or lightheadedness with hypotension: Unknown Has patient had a PCN reaction causing severe rash involving mucus membranes or skin necrosis: Unknown Has patient had a PCN reaction that required hospitalization: Unknown Has patient had a PCN reaction occurring within the last 10 years: Unknown If all of the above answers are "NO", then may proceed with Cephalosporin use.      Antimicrobials this admission: Cefepime x1 8/21; Vancomycin, Zosyn 8/24  >>    >>   Dose adjustments this admission: 8/26 2330 vanc level 12. Changed to 1 gram q 12 hours. Level before 4th new dose.   Microbiology results: 8/22 BCx: pending 8/22 MRSA PCR: (-) 8/24 Resp cx: abundant Staph aureus      8/24: CXR pending 8/22 UA: LE(-) NO2(-) WBC 6-30  Thank you for allowing pharmacy to be a part of this patient's care.  Le Fer[place] S 03/03/2017 12:20 AM

## 2017-03-03 NOTE — Progress Notes (Signed)
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 She continues to be on milrinone and very small dose of norepinephrine drip. Reviewed the events over the weekend including extubation early Saturday morning with reintubation within 2 hours due to respiratory distress. Bradycardia improved after she was switched from Precedex to propofol.  Inpatient Medications    Scheduled Meds: . ALPRAZolam  1 mg Oral TID  . chlorhexidine gluconate (MEDLINE KIT)  15 mL Mouth Rinse BID  . feeding supplement (PRO-STAT SUGAR FREE 64)  60 mL Per Tube BID  . feeding supplement (VITAL HIGH PROTEIN)  1,000 mL Per Tube Q24H  . heparin  5,000 Units Subcutaneous Q8H  . hydrocortisone sod succinate (SOLU-CORTEF) inj  50 mg Intravenous Q6H  . insulin aspart  2-6 Units Subcutaneous Q4H  . insulin glargine  10 Units Subcutaneous Q24H  . mouth rinse  15 mL Mouth Rinse 10 times per day  . multivitamin  15 mL Per Tube Daily  . potassium chloride  40 mEq Per Tube Once  . sodium chloride flush  10-40 mL Intracatheter Q12H   Continuous Infusions: . cefTRIAXone (ROCEPHIN)  IV    . dextrose    . epinephrine Stopped (02/28/17 1600)  . famotidine (PEPCID) IV Stopped (03/02/17 2242)  . fentaNYL infusion INTRAVENOUS 100 mcg/hr (03/03/17 0830)  . magnesium sulfate 1 - 4 g bolus IVPB 4 g (03/03/17 1057)  . milrinone 0.5 mcg/kg/min (03/03/17 0830)  . norepinephrine (LEVOPHED) Adult infusion 4 mcg/min (03/03/17 0830)  . propofol (DIPRIVAN) infusion 60 mcg/kg/min (03/03/17 1054)   PRN  Meds: acetaminophen, atropine, dextrose, sennosides **AND** docusate, levalbuterol, midazolam, naLOXone (NARCAN)  injection, pneumococcal 23 valent vaccine, sodium chloride flush   Vital Signs    Vitals:   03/03/17 0800 03/03/17 0900 03/03/17 1000 03/03/17 1100  BP: (!) 98/50 (!) 115/57 (!) 113/52 (!) 115/56  Pulse: 67 67 69 67  Resp: (!) 21 20 (!) 23 (!) 22  Temp: 98.8 F (37.1 C) 98.8 F (37.1 C) 98.8 F (37.1 C) 98.8 F (37.1 C)  TempSrc:  Core (Comment)    SpO2: 96% 96% 96% 96%  Weight:      Height:        Intake/Output Summary (Last 24 hours) at 03/03/17 1127 Last data filed at 03/03/17 1055  Gross per 24 hour  Intake          2430.89 ml  Output             1750 ml  Net           680.89 ml   Filed Weights   02/26/17 0226 03/02/17 0500 03/03/17 0500  Weight: 195 lb 8.8 oz (88.7 kg) 200 lb 9.9 oz (91 kg) 204 lb 9.4 oz (92.8 kg)    Physical Exam    GEN: Critically ill appearing, in no acute distress.  HEENT: Grossly normal.  Neck: Supple, no JVD, carotid bruits, or masses. Cardiac: Irregular, no murmurs, rubs, or gallops. No clubbing, cyanosis, edema.  Radials/DP/PT 2+ and equal bilaterally.  Respiratory:  Diminished breath sounds  bilaterally. Intubated.  GI: Soft, nontender, nondistended, BS + x 4. MS: No deformity or atrophy. Skin: Cool and dry, no rash. Neuro:  Intubated and sedated. Psych: Intubated and sedated.  Labs    CBC  Recent Labs  03/02/17 0741 03/03/17 0445  WBC 23.9* 19.7*  NEUTROABS 22.4* 17.9*  HGB 8.8* 8.5*  HCT 25.7* 24.6*  MCV 88.4 89.8  PLT 225 779   Basic Metabolic Panel  Recent Labs  03/01/17 0606  03/02/17 2059 03/03/17 0445  NA 135  < > 138 136  K 4.8  < > 4.3 4.1  CL 108  < > 109 108  CO2 20*  < > 22 22  GLUCOSE 120*  < > 100* 149*  BUN 16  < > 27* 30*  CREATININE 0.84  < > 0.76 0.72  CALCIUM 8.3*  < > 8.2* 8.1*  MG 2.0  < > 1.9 1.9  PHOS 2.5  --   --   --   < > = values in this interval not displayed. Liver  Function Tests No results for input(s): AST, ALT, ALKPHOS, BILITOT, PROT, ALBUMIN in the last 72 hours. No results for input(s): LIPASE, AMYLASE in the last 72 hours. Cardiac Enzymes No results for input(s): CKTOTAL, CKMB, CKMBINDEX, TROPONINI in the last 72 hours. BNP Invalid input(s): POCBNP D-Dimer No results for input(s): DDIMER in the last 72 hours. Hemoglobin A1C No results for input(s): HGBA1C in the last 72 hours. Fasting Lipid Panel  Recent Labs  03/02/17 2059  TRIG 101   Thyroid Function Tests No results for input(s): TSH, T4TOTAL, T3FREE, THYROIDAB in the last 72 hours.  Invalid input(s): FREET3  Telemetry    Sinus rhythm with PACs - Personally Reviewed  ECG    n/a - Personally Reviewed  Radiology    Dg Abd 1 View  Result Date: 03/01/2017 CLINICAL DATA:  NG tube placement EXAM: ABDOMEN - 1 VIEW COMPARISON:  02/26/2017 FINDINGS: Enteric tube terminates in the distal gastric body. No evidence of small bowel obstruction. Mild gaseous distention of a loop of colon. IMPRESSION: Enteric tube terminates in the distal gastric body. Electronically Signed   By: Julian Hy M.D.   On: 03/01/2017 15:02   Dg Chest Port 1 View  Result Date: 03/03/2017 CLINICAL DATA:  Respiratory failure. EXAM: PORTABLE CHEST 1 VIEW COMPARISON:  03/02/2017. FINDINGS: Endotracheal tube, NG tube, right PICC line stable position. Heart size stable. Diffuse bilateral pulmonary infiltrates. Small left pleural effusion. No pneumothorax . IMPRESSION: 1.  Lines and tubes in stable position. 2. Diffuse bilateral pulmonary infiltrates/ edema. Small left pleural effusion. No interim change. Electronically Signed   By: Marcello Moores  Register   On: 03/03/2017 06:37   Dg Chest Port 1 View  Result Date: 03/02/2017 CLINICAL DATA:  Acute respiratory failure. On ventilator. Cardiac arrest with ventricular fibrillation. EXAM: PORTABLE CHEST 1 VIEW COMPARISON:  03/01/2017 FINDINGS: Endotracheal tube remains in  appropriate position. New nasogastric tube is seen entering the stomach. New right arm PICC line is seen with tip in the region of the cavoatrial junction. Stable mild cardiomegaly. Previously seen pneumomediastinum no longer visualized. No evidence of pneumothorax. Mild diffuse bilateral pulmonary airspace disease shows no significant interval change. IMPRESSION: Stable cardiomegaly and diffuse bilateral airspace disease/ edema. Interval resolution of pneumomediastinum since previous study. No evidence of pneumothorax. Electronically Signed   By: Earle Gell M.D.   On: 03/02/2017 09:38    Cardiac Studies   LHC 02/26/2017: Conclusion   Conclusions: 1. No angiographically  significant coronary artery disease. 2. Severely reduced left ventricular contraction (<25%) with moderately elevated left ventricular filling pressure (LVEDP 28 mmHg).  Recommendations: 1. Hypothermia protocol per hospitalist and ICU teams. 2. Check magnesium level; replete electrolytes for goal potassium and magnesium greater than 4.0 and 2.0, respectively. 3. Consider gentle diuresis, given elevated LVEDP and severely reduced LVEF. 4. Obtain transthoracic echocardiogram    TTE 02/26/2017: Study Conclusions  - Left ventricle: The cavity size was mildly dilated. Systolic   function was severely reduced. The estimated ejection fraction   was <20%. Diffuse hypokinesis. Regional wall motion abnormalities   cannot be excluded. The study is not technically sufficient to   allow evaluation of LV diastolic function. - Mitral valve: There was mild regurgitation. - Right ventricle: Systolic function was mildly reduced. - Pulmonary arteries: Systolic pressure could not be accurately   estimated. - Inferior vena cava: The vessel was normal in size. The   respirophasic diameter changes were in the normal range (>= 50%),   consistent with normal central venous pressure.  Patient Profile     56 y.o. female with history of  HTN, migraine disorder, and dependent edema who has had diarrhea for the past 2-3 days, taking up to 12 Imodium daily presented [facility] with VF arrest. Cardiac cath showed no significant CAD.  Assessment & Plan    1.  VF arrest: -Inciting event is still somewhat unclear, though given recent diarrhea and significant electrolyte abnormalities on admission, it has been suspected that she may have had a primary arrhythmogenic event. Catheterization showed no significant coronary artery disease. 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  - continue supportive care.  2. Acute hypoxic respiratory failure: -Remains intubated and sedated. Avoid Precedex due to cardiomyopathy and bradycardia. -Wean oxygen as able per PCCM .   3. Acute systolic CHF: -Severely reduced LVEF by LV gram - Echo with an EF of 10-15% with global hypokinesis. -  I personally reviewed her chest x-ray which showed evidence of volume overload. The patient and reduce used one dose of IV furosemide 20 mg this morning. Continue to monitor and diuresis as needed. Continue current dose of milrinone. Repeat echocardiogram is ordered for today. Not able to start a beta blocker or an ACE inhibitor at this time due to hypotension.  4. Cardiogenic shock: - Continue pressors as outlined above  5. AKI: - Resolved.     Signed, Kathlyn Sacramento, MD [facility] 03/03/2017, 11:27 AM

## 2017-03-03 NOTE — Progress Notes (Signed)
PULMONARY / CRITICAL CARE MEDICINE   Name: Miranda [name] MRN: [id number] DOB: Jan 31, 1961    ADMISSION DATE:  02/25/2017   PT PROFILE: 32 F suffered OOH arrest with at least 20 mins ACLS/CPR. Initial identified rhythm was VF. Underwent defib X 3 before ROSC. LHC revealed no CAD, severely reduced LV function and moderately elevated LVEDP. TTM 33 degree protocol initiated.  MAJOR EVENTS/TEST RESULTS: 08/21 CT head: no acute findings 08/21 CTA chest: no PE. Diffuse bilateral opacities 08/22 LHC: no CAD, severely reduced LV function and moderately elevated LVEDP 08/22 Echocardiogram:  08/22 Refractory shock despite norepinephrine and phenylephrine infusions. Epinephrine infusion initiated 08/23 Rewarming initiated. Sinus bradyarrhythmias. Amiodarone DC'd. Milrinone initiated. Improved gas exchange. Off of phenylephrine and norepi. Weaning of of epinephrine 08/25 Pt failed extubation requiring emergent reintubation within minutes, difficult reintubation   INDWELLING DEVICES:: ETT 08/21 >>  L IJ CVL 08/22 >>  R femoral A-line 08/22 >>   MICRO DATA: MRSA PCR 08/22 >>negative  Blood 08/22 >>negative  Urine 08/24>>negative Respiratory 08/24>>staph aureus   ANTIMICROBIALS:  Zosyn 08/24>> Vancomycin 08/24>>  SUBJECTIVE:  Pt did not tolerate precedex gtt due to persistent bradycardia, therefore medication discontinued heart rate improved.  VITAL SIGNS: BP (!) 93/51   Pulse 63   Temp (!) 97.2 F (36.2 C)   Resp (!) 23   Ht 5\' 2"  (1.575 m)   Wt 91 kg (200 lb 9.9 oz)   SpO2 96%   BMI 36.69 kg/m   HEMODYNAMICS:    VENTILATOR SETTINGS: Vent Mode: PCV FiO2 (%):  [45 %-60 %] 50 % Set Rate:  [20 bmp] 20 bmp PEEP:  [8 cmH20] 8 cmH20 Plateau Pressure:  [30 cmH20] 30 cmH20  INTAKE / OUTPUT: I/O last 3 completed shifts: In: 2030.3 [I.V.:1212.9; NG/GT:567.3; IV Piggyback:250] Out: 1599 [Urine:1599]  PHYSICAL EXAMINATION: General: acutely ill appearing Caucasian female, intubated  NAD  Neuro: sedated, withdraws from painful stimulation, PERRL Cardiovascular: irreg, bradycardia with junctional escape beats and PACs, no M/R/G Lungs: rhonchi and crackles throughout, even, non labored; no wheezes or rales  Abdomen: hypoactive BS x4, soft, obese, non distended  Ext: 2+ generalized edema, moves all extremities   LABS:  BMET  Recent Labs Lab 03/01/17 0606 03/02/17 0741 03/02/17 2059  NA 135 134* 138  K 4.8 4.6 4.3  CL 108 105 109  CO2 20* 23 22  BUN 16 16 27*  CREATININE 0.84 0.69 0.76  GLUCOSE 120* 149* 100*    Electrolytes  Recent Labs Lab 02/26/17 0340  03/01/17 0606 03/02/17 0741 03/02/17 2059  CALCIUM 8.4*  < > 8.3* 8.3* 8.2*  MG 1.8  < > 2.0 2.0 1.9  PHOS 3.6  --  2.5  --   --   < > = values in this interval not displayed.  CBC  Recent Labs Lab 02/28/17 0431 03/01/17 0606 03/02/17 0741  WBC 47.4* 26.5* 23.9*  HGB 11.0* 10.5* 8.8*  HCT 31.6* 30.3* 25.7*  PLT 376 266 225    Coag's  Recent Labs Lab 02/26/17 0340 02/26/17 0612 02/26/17 1237  APTT 30 33 34  INR 1.18 1.29 1.34    Sepsis Markers  Recent Labs Lab 02/28/17 0431  02/28/17 0809 02/28/17 1321 03/01/17 0606 03/01/17 1241 03/02/17 0521  LATICACIDVEN  --   < > 2.6* 2.0*  --  0.6  --   PROCALCITON 2.79  --   --   --  0.81  --  0.54  < > = values in this interval not displayed.  ABG  Recent Labs Lab 02/26/17 0205 02/26/17 1227 02/28/17 0604  PHART 7.29* 7.32* 7.44  PCO2ART 39 35 24*  PO2ART 92 127* 83    Liver Enzymes  Recent Labs Lab 02/26/17 1620  AST 573*  ALT 416*    Cardiac Enzymes  Recent Labs Lab 02/25/17 2240  TROPONINI 0.03*    Glucose  Recent Labs Lab 03/02/17 0755 03/02/17 1219 03/02/17 1557 03/02/17 2001 03/02/17 2113 03/03/17 0028  GLUCAP 133* 145* 133* 129* 112* 160*    ASSESSMENT / PLAN:  PULMONARY A: Acute hypoxemic respiratory failure after cardiac arrest Post reintubation subcutaneous air and possible  pneumopericardium without pneumothorax 08/25-resolved P: Full vent support for now Daily SBT if/when meets crtieria   Maintain O2 sats >92% Repeat CXR today  Continue VAP bundle   CARDIOVASCULAR A:  Cardiac Arrest-initial rhythm VF Acute systolic CHF  Bradycardia-resolved Cardiology following appreciate input  Prn levophed and epinephrine drips to maintain map >65 mmHg Added stress dose steroids 08/26 Milrinone initiated 08/23 20 mg iv lasix x1 dose  Prn Atropine for hr <40  RENAL A:   AKI-resolved  P:   Monitor BMET intermittently Monitor I/Os Correct electrolytes as indicated  GASTROINTESTINAL A:   Recent diarrheal illness P:   SUP: IV famotidine Continue TF protocol   HEMATOLOGIC A:   Anemia without acute blood loss  P:  DVT px: SQ heparin Monitor CBC intermittently Transfuse for hgb <7  INFECTIOUS A:   Leukocytosis P: Trend WBC and monitor fever curve Trend PCT Continue abx as listed above Follow cultures   ENDOCRINE A:   Stress induced hyperglycemia  P:   Continue SSI and lantus CBG's q4hrs  NEUROLOGIC A: Mechanical Intubation Discomfort  P: Maintain RASS goal 0 to -1 Discontinued Precedex gtt and started Propofol gtt to maintain RASS goal  Prn fentanyl for pain management WUA daily Promote family presence at bedside   Updates: Spoke with pts husband the night of 03/02/17 regarding plan of care and pt condition all questions answered.  Sonda Rumble, AGNP  Pulmonary/Critical Care Pager [phone] (please enter 7 digits) PCCM Consult Pager [phone] (please enter 7 digits)

## 2017-03-04 ENCOUNTER — Encounter: Payer: Self-pay | Admitting: *Deleted

## 2017-03-04 ENCOUNTER — Inpatient Hospital Stay: Payer: BC Managed Care – PPO

## 2017-03-04 DIAGNOSIS — R4182 Altered mental status, unspecified: Secondary | ICD-10-CM

## 2017-03-04 LAB — BLOOD GAS, ARTERIAL
ACID-BASE EXCESS: 1.7 mmol/L (ref 0.0–2.0)
BICARBONATE: 26.6 mmol/L (ref 20.0–28.0)
FIO2: 0.45
LHR: 16 {breaths}/min
O2 SAT: 96.7 %
PCO2 ART: 42 mmHg (ref 32.0–48.0)
PEEP/CPAP: 5 cmH2O
PRESSURE CONTROL: 20 cmH2O
Patient temperature: 37
pH, Arterial: 7.41 (ref 7.350–7.450)
pO2, Arterial: 87 mmHg (ref 83.0–108.0)

## 2017-03-04 LAB — BASIC METABOLIC PANEL
Anion gap: 7 (ref 5–15)
BUN: 36 mg/dL — ABNORMAL HIGH (ref 6–20)
CO2: 25 mmol/L (ref 22–32)
CREATININE: 0.67 mg/dL (ref 0.44–1.00)
Calcium: 7.6 mg/dL — ABNORMAL LOW (ref 8.9–10.3)
Chloride: 105 mmol/L (ref 101–111)
GFR calc Af Amer: >60 mL/min (ref >60)
GFR calc non Af Amer: >60 mL/min (ref >60)
GLUCOSE: 138 mg/dL — AB (ref 65–99)
Potassium: 4.5 mmol/L (ref 3.5–5.1)
SODIUM: 137 mmol/L (ref 135–145)

## 2017-03-04 LAB — MAGNESIUM: Magnesium: 2.6 mg/dL — ABNORMAL HIGH (ref 1.7–2.4)

## 2017-03-04 LAB — GLUCOSE, CAPILLARY
GLUCOSE-CAPILLARY: 91 mg/dL (ref 65–99)
Glucose-Capillary: 102 mg/dL — ABNORMAL HIGH (ref 65–99)
Glucose-Capillary: 89 mg/dL (ref 65–99)
Glucose-Capillary: 93 mg/dL (ref 65–99)
Glucose-Capillary: 96 mg/dL (ref 65–99)

## 2017-03-04 LAB — PHOSPHORUS: PHOSPHORUS: 4.5 mg/dL (ref 2.5–4.6)

## 2017-03-04 LAB — C DIFFICILE QUICK SCREEN W PCR REFLEX
C DIFFICLE (CDIFF) ANTIGEN: NEGATIVE
C Diff interpretation: NOT DETECTED
C Diff toxin: NEGATIVE

## 2017-03-04 LAB — PROCALCITONIN: PROCALCITONIN: 0.42 ng/mL

## 2017-03-04 MED ORDER — HYDROCORTISONE NA SUCCINATE PF 100 MG IJ SOLR
50.0000 mg | Freq: Two times a day (BID) | INTRAMUSCULAR | Status: DC
  Administered 2017-03-04 – 2017-03-05 (×3): 50 mg via INTRAVENOUS
  Filled 2017-03-04 (×3): qty 2

## 2017-03-04 MED ORDER — VECURONIUM BROMIDE 10 MG IV SOLR
10.0000 mg | INTRAVENOUS | Status: DC | PRN
  Administered 2017-03-04 – 2017-03-07 (×2): 10 mg via INTRAVENOUS
  Filled 2017-03-04 (×2): qty 10

## 2017-03-04 MED ORDER — HYDROMORPHONE HCL 1 MG/ML IJ SOLN
2.0000 mg | INTRAMUSCULAR | Status: DC | PRN
  Administered 2017-03-04 – 2017-03-05 (×3): 2 mg via INTRAVENOUS
  Filled 2017-03-04 (×3): qty 2

## 2017-03-04 MED ORDER — FUROSEMIDE 10 MG/ML IJ SOLN
20.0000 mg | Freq: Once | INTRAMUSCULAR | Status: AC
  Administered 2017-03-04: 20 mg via INTRAVENOUS
  Filled 2017-03-04: qty 2

## 2017-03-04 MED ORDER — HYDROMORPHONE HCL 1 MG/ML IJ SOLN: 2.0000 mg | INTRAMUSCULAR | Status: DC | PRN

## 2017-03-04 NOTE — Progress Notes (Signed)
Patient Name: Miranda [name] Date of Encounter: 03/04/2017  Primary Cardiologist: New to [facility] - consult by End  Hospital Problem List     Principal Problem:   Cardiac arrest with ventricular fibrillation [facility]) Active Problems:   Cardiac arrest ([place])   Acute respiratory failure ([place])   Hypokalemia   Encounter for central line placement   Cardiogenic shock ([place])   Acute pulmonary edema (H[facility])   Anoxic encephalopathy ([place])   Bradycardia     Subjective   Remains intubated. Sedation has been weaned. Remains on milrinone 0.3. Repeat echo on 8/27 showed improved EF of 55-60%, no RWMA, mild AI, PASP 46 mmHg.   Inpatient Medications    Scheduled Meds: . ALPRAZolam  1 mg Oral TID  . chlorhexidine gluconate (MEDLINE KIT)  15 mL Mouth Rinse BID  . feeding supplement (PRO-STAT SUGAR FREE 64)  60 mL Per Tube BID  . feeding supplement (VITAL HIGH PROTEIN)  1,000 mL Per Tube Q24H  . heparin  5,000 Units Subcutaneous Q8H  . hydrocortisone sod succinate (SOLU-CORTEF) inj  50 mg Intravenous Q6H  . insulin aspart  2-6 Units Subcutaneous Q4H  . insulin glargine  10 Units Subcutaneous Q24H  . mouth rinse  15 mL Mouth Rinse 10 times per day  . multivitamin  15 mL Per Tube Daily  . sodium chloride flush  10-40 mL Intracatheter Q12H   Continuous Infusions: . cefTRIAXone (ROCEPHIN)  IV Stopped (03/03/17 1349)  . dextrose    . epinephrine Stopped (02/28/17 1600)  . famotidine (PEPCID) IV Stopped (03/03/17 2216)  . fentaNYL infusion INTRAVENOUS Stopped (03/04/17 0747)  . milrinone 0.3 mcg/kg/min (03/04/17 0044)  . norepinephrine (LEVOPHED) Adult infusion Stopped (03/04/17 0747)  . propofol (DIPRIVAN) infusion Stopped (03/04/17 0747)   PRN Meds: acetaminophen, atropine, dextrose, sennosides **AND** docusate, levalbuterol, midazolam, naLOXone (NARCAN)  injection, pneumococcal 23 valent vaccine, sodium chloride flush   Vital Signs    Vitals:   03/04/17 0500 03/04/17 0600 03/04/17 0700  03/04/17 0800  BP: 119/65 (!) 95/53 125/69 (!) 153/79  Pulse: (!) 54 (!) 56 (!) 54 65  Resp: '20 20 20 ' (!) 31  Temp: 98.8 F (37.1 C) 98.8 F (37.1 C) 98.8 F (37.1 C) 99.1 F (37.3 C)  TempSrc:      SpO2: 96% 97% 96% 93%  Weight:      Height:        Intake/Output Summary (Last 24 hours) at 03/04/17 0915 Last data filed at 03/04/17 0600  Gross per 24 hour  Intake           1791.2 ml  Output             1775 ml  Net             16.2 ml   Filed Weights   03/02/17 0500 03/03/17 0500 03/04/17 0451  Weight: 200 lb 9.9 oz (91 kg) 204 lb 9.4 oz (92.8 kg) 212 lb 11.9 oz (96.5 kg)    Physical Exam    GEN: Ill appearing, in no acute distress.  HEENT: Grossly normal.  Neck: Supple, no JVD, carotid bruits, or masses. Cardiac: RRR, no murmurs, rubs, or gallops. No clubbing, cyanosis, edema.  Radials/DP/PT 2+ and equal bilaterally.  Respiratory:  Decreased and coarse breath sounds bilaterally. Intubated.  GI: Soft, nontender, nondistended, BS + x 4. MS: no deformity or atrophy. Skin: warm and dry, no rash. Neuro:  Intubated. Psych: Intubated.  Labs    CBC  Recent Labs  03/02/17  0741 03/03/17 0445  WBC 23.9* 19.7*  NEUTROABS 22.4* 17.9*  HGB 8.8* 8.5*  HCT 25.7* 24.6*  MCV 88.4 89.8  PLT 225 168   Basic Metabolic Panel  Recent Labs  03/03/17 0445 03/04/17 0500  NA 136 137  K 4.1 4.5  CL 108 105  CO2 22 25  GLUCOSE 149* 138*  BUN 30* 36*  CREATININE 0.72 0.67  CALCIUM 8.1* 7.6*  MG 1.9 2.6*  PHOS  --  4.5   Liver Function Tests No results for input(s): AST, ALT, ALKPHOS, BILITOT, PROT, ALBUMIN in the last 72 hours. No results for input(s): LIPASE, AMYLASE in the last 72 hours. Cardiac Enzymes No results for input(s): CKTOTAL, CKMB, CKMBINDEX, TROPONINI in the last 72 hours. BNP Invalid input(s): POCBNP D-Dimer No results for input(s): DDIMER in the last 72 hours. Hemoglobin A1C No results for input(s): HGBA1C in the last 72 hours. Fasting Lipid  Panel  Recent Labs  03/02/17 2059  TRIG 101   Thyroid Function Tests No results for input(s): TSH, T4TOTAL, T3FREE, THYROIDAB in the last 72 hours.  Invalid input(s): FREET3  Telemetry    NSR - Personally Reviewed  ECG    n/a - Personally Reviewed  Radiology    Dg Chest Port 1 View  Result Date: 03/04/2017 CLINICAL DATA:  Respiratory failure. EXAM: PORTABLE CHEST 1 VIEW COMPARISON:  03/03/2017. FINDINGS: Endotracheal tube, NG tube, right PICC line in stable position. Heart size normal. Diffuse bilateral pulmonary infiltrates/edema again noted. Slight improvement from prior exam. Small left pleural effusion again noted. No pneumothorax. IMPRESSION: 1. Lines and tubes stable position. 2. Diffuse bilateral pulmonary infiltrates/edema again noted. Slight improvement from prior exam. Small left pleural effusion again noted. Electronically Signed   By: Marcello Moores  Register   On: 03/04/2017 06:42   Dg Chest Port 1 View  Result Date: 03/03/2017 CLINICAL DATA:  Respiratory failure. EXAM: PORTABLE CHEST 1 VIEW COMPARISON:  03/02/2017. FINDINGS: Endotracheal tube, NG tube, right PICC line stable position. Heart size stable. Diffuse bilateral pulmonary infiltrates. Small left pleural effusion. No pneumothorax . IMPRESSION: 1.  Lines and tubes in stable position. 2. Diffuse bilateral pulmonary infiltrates/ edema. Small left pleural effusion. No interim change. Electronically Signed   By: Marcello Moores  Register   On: 03/03/2017 06:37    Cardiac Studies   TTE 03/03/2017: Study Conclusions  - Left ventricle: The cavity size was normal. There was mild   concentric hypertrophy. Systolic function was normal. The   estimated ejection fraction was in the range of 55% to 60%. Wall   motion was normal; there were no regional wall motion   abnormalities. - Aortic valve: There was mild regurgitation. - Pulmonary arteries: Systolic pressure was mildly to moderately   increased. PA peak pressure: 46 mm Hg  (S).  Impressions:  - Limited echo to evaluate ejection fraction.   When compared to recent echo, LV systolic function normalized   completely with improvement in ejection fraction of from 10% to   55%.  LHC 02/26/2017: Conclusion   Conclusions: 1. No angiographically significant coronary artery disease. 2. Severely reduced left ventricular contraction (<25%) with moderately elevated left ventricular filling pressure (LVEDP 28 mmHg).  Recommendations: 1. Hypothermia protocol per hospitalist and ICU teams. 2. Check magnesium level; replete electrolytes for goal potassium and magnesium greater than 4.0 and 2.0, respectively. 3. Consider gentle diuresis, given elevated LVEDP and severely reduced LVEF. 4. Obtain transthoracic echocardiogram    TTE 02/26/2017: Study Conclusions  - Left ventricle: The cavity size was  mildly dilated. Systolic function was severely reduced. The estimated ejection fraction was <20%. Diffuse hypokinesis. Regional wall motion abnormalities cannot be excluded. The study is not technically sufficient to allow evaluation of LV diastolic function. - Mitral valve: There was mild regurgitation. - Right ventricle: Systolic function was mildly reduced. - Pulmonary arteries: Systolic pressure could not be accurately estimated. - Inferior vena cava: The vessel was normal in size. The respirophasic diameter changes were in the normal range (>= 50%), consistent with normal central venous pressure.   Patient Profile     56 y.o. female with history of HTN, migraine disorder, and dependent edema who has had diarrhea for the past 2-3 days, taking up to 12 Imodium daily presented to [facility] with VF arrest. Cardiac cath showed no significant CAD.  Assessment & Plan    1. VF arrest: -Inciting event is still somewhat unclear, though given recent diarrhea and significant electrolyte abnormalities on admission, it has been suspected that she may have  had a primary arrhythmogenic event. Catheterization showed no significant coronary artery disease. 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  -Continue supportive care  2. Acute hypoxic respiratory failure: -Remains intubated  -Sedation weaned -Avoid Precedex due to cardiomyopathy and bradycardia -Wean oxygen as able per PCCM   3. Acute systolic CHF: -Severely reduced LVEF by LV gram -Echo 8/22 with an EF of 10-15% with global hypokinesis -Repeat echo 8/27 showed improved EF to 55-60%, no RWMA -Wean milrinone gtt today, possibly discontinue late this AM/early PM -She does continue to appear volume overloaded and had elevated PASP on repeat echo -Diurese as able   -Has not been on beta blocker, ACE inhibitor/ARB/ANRI due to hypotension  4. Cardiogenic shock: -Resolved  5. AKI: - Resolved  Signed, Christell Faith, PA-C [place] Pager: [phone] 03/04/2017, 9:15 AM

## 2017-03-04 NOTE — Progress Notes (Signed)
CH made a follow up visit. Husband and friend are bedside. Husband appears strained. He feels there is not much change. CH offered a time of being with him. CH will follow up on later rounding.    03/04/17 1000  Clinical Encounter Type  Visited With Patient;Patient and family together  Visit Type Follow-up  Consult/Referral To Chaplain

## 2017-03-04 NOTE — Progress Notes (Signed)
   03/04/17 2200  Vitals  BP (!) 146/75  MAP (mmHg) 97  BP Location Left Arm  BP Method Automatic  Patient Position (if appropriate) Lying  Pulse Rate 66  Pulse Rate Source Monitor  ECG Heart Rate 66  Cardiac Rhythm NSR  Resp (!) 30  Oxygen Therapy  SpO2 95 %  O2 Device Ventilator  FiO2 (%) 45 %  Pulse Oximetry Type Continuous  End [place] CO2 (EtCO2) 39  Art Line  Arterial Line BP 182/82  Arterial Line MAP (mmHg) 116 mmHg  Arterial Line Location Right femoral  Pain Assessment  Pain Assessment CPOT  Critical Care Pain Observation Tool (CPOT)  Facial Expression 1  Body Movements 0  Muscle Tension 0  Compliance with ventilator (intubated pts.) 1 (pt with tachypnea and tachycardia)  Vocalization (extubated pts.) N/A  CPOT Total 2  Pt with tachypnea, hypertensive, and muscle rigidity.  Not tolerating ventilator mode.  Medicated with Dilaudid 2mg  IVP.  Blake Divine, RN

## 2017-03-04 NOTE — Progress Notes (Signed)
PULMONARY / CRITICAL CARE MEDICINE   Name: Miranda [name] MRN: [id number] DOB: 1961-05-25    ADMISSION DATE:  02/25/2017   PT PROFILE: 40 F suffered OOH arrest with at least 20 mins ACLS/CPR. Initial identified rhythm was VF. Underwent defib X 3 before ROSC. LHC revealed no CAD, severely reduced LV function and moderately elevated LVEDP. TTM 33 degree protocol initiated.  MAJOR EVENTS/TEST RESULTS: 08/21 CT head: no acute findings 08/21 CTA chest: no PE. Diffuse bilateral opacities 08/22 LHC: no CAD, severely reduced LV function and moderately elevated LVEDP 08/22 Echocardiogram:  08/22 Refractory shock despite norepinephrine and phenylephrine infusions. Epinephrine infusion initiated 08/23 Rewarming initiated. Sinus bradyarrhythmias. Amiodarone DC'd. Milrinone initiated. Improved gas exchange. Off of phenylephrine and norepi. Weaning of of epinephrine 08/25 Pt failed extubation requiring emergent reintubation within minutes, difficult reintubation  0 8/27 failed weaning trials INDWELLING DEVICES:: ETT 08/21 >>  L IJ CVL 08/22 >>  R femoral A-line 08/22 >>   MICRO DATA: MRSA PCR 08/22 >>negative  Blood 08/22 >>negative  Urine 08/24>>negative Respiratory 08/24>>staph aureus   ANTIMICROBIALS:  Zosyn 08/24>> Vancomycin 08/24>>  SUBJECTIVE:  Patient remains critically ill On full vent support Lasix as tolerated FiO2 down to 45% Husband at bedside and updated Patient failed weaning trials yesterday we will try again today  VITAL SIGNS: BP (!) 153/79   Pulse 65   Temp 99.1 F (37.3 C)   Resp (!) 31   Ht 5\' 2"  (1.575 m)   Wt 212 lb 11.9 oz (96.5 kg)   SpO2 93%   BMI 38.91 kg/m   HEMODYNAMICS:    VENTILATOR SETTINGS: Vent Mode: PCV FiO2 (%):  [45 %] 45 % Set Rate:  [20 bmp] 20 bmp PEEP:  [8 cmH20] 8 cmH20 Plateau Pressure:  [30 cmH20-31 cmH20] 30 cmH20  INTAKE / OUTPUT: I/O last 3 completed shifts: In: 3492.5 [I.V.:1821; NG/GT:1421.5; IV Piggyback:250] Out:  3476 [Urine:3476] PHYSICAL EXAMINATION: General: acutely ill appearing Caucasian female, intubated NAD  Neuro: sedated, withdraws from painful stimulation, PERRL Cardiovascular: irreg, bradycardia with junctional escape beats and PACs, no M/R/G Lungs: rhonchi and crackles throughout, even, non labored; no wheezes or rales  Abdomen: hypoactive BS x4, soft, obese, non distended  Ext: 2+ generalized edema, moves all extremities    LABS:  BMET  Recent Labs Lab 03/02/17 2059 03/03/17 0445 03/04/17 0500  NA 138 136 137  K 4.3 4.1 4.5  CL 109 108 105  CO2 22 22 25   BUN 27* 30* 36*  CREATININE 0.76 0.72 0.67  GLUCOSE 100* 149* 138*    Electrolytes  Recent Labs Lab 02/26/17 0340  03/01/17 0606  03/02/17 2059 03/03/17 0445 03/04/17 0500  CALCIUM 8.4*  < > 8.3*  < > 8.2* 8.1* 7.6*  MG 1.8  < > 2.0  < > 1.9 1.9 2.6*  PHOS 3.6  --  2.5  --   --   --  4.5  < > = values in this interval not displayed.  CBC  Recent Labs Lab 03/01/17 0606 03/02/17 0741 03/03/17 0445  WBC 26.5* 23.9* 19.7*  HGB 10.5* 8.8* 8.5*  HCT 30.3* 25.7* 24.6*  PLT 266 225 191    Coag's  Recent Labs Lab 02/26/17 0340 02/26/17 0612 02/26/17 1237  APTT 30 33 34  INR 1.18 1.29 1.34    Sepsis Markers  Recent Labs Lab 02/28/17 0809 02/28/17 1321  03/01/17 1241 03/02/17 0521 03/03/17 0445 03/04/17 0500  LATICACIDVEN 2.6* 2.0*  --  0.6  --   --   --  PROCALCITON  --   --   < >  --  0.54 0.69 0.42  < > = values in this interval not displayed.  ABG  Recent Labs Lab 02/26/17 1227 02/28/17 0604 03/03/17 0810  PHART 7.32* 7.44 7.45  PCO2ART 35 24* 26*  PO2ART 127* 83 81*    Liver Enzymes  Recent Labs Lab 02/26/17 1620  AST 573*  ALT 416*    Cardiac Enzymes  Recent Labs Lab 02/25/17 2240  TROPONINI 0.03*    Glucose  Recent Labs Lab 03/03/17 1216 03/03/17 1636 03/03/17 1949 03/03/17 2332 03/04/17 0336 03/04/17 0759  GLUCAP 146* 123* 105* 141* 96 102*     ASSESSMENT / PLAN: 56 year old white female admitted to the ICU for acute cardiac arrest CPR for approximately 20 minutes status post hypothermia protocol with 1 failed extubation and is now on full vent support and has failed weaning trials over the last several days   PULMONARY A: Acute hypoxemic respiratory failure after cardiac arrest Post reintubation subcutaneous air and possible pneumopericardium without pneumothorax 08/25-resolved P: Full vent support for now Daily SBT if/when meets crtieria   Maintain O2 sats >92% Repeat CXR today  Continue VAP bundle   CARDIOVASCULAR A:  Cardiac Arrest-initial rhythm VF Acute systolic CHF  Bradycardia-resolved Cardiology following appreciate input  Prn levophed and epinephrine drips to maintain map >65 mmHg Added stress dose steroids 08/26 Milrinone initiated 08/23 Prn Atropine for hr <40 Lasix as tolerated  RENAL A:   AKI-resolved  P:   Monitor BMET intermittently Monitor I/Os Correct electrolytes as indicated  GASTROINTESTINAL A:   Recent diarrheal illness P:   SUP: IV famotidine Continue TF protocol   HEMATOLOGIC A:   Anemia without acute blood loss  P:  DVT px: SQ heparin Monitor CBC intermittently Transfuse for hgb <7  INFECTIOUS A:   Leukocytosis P: Trend WBC and monitor fever curve Trend PCT Continue abx as listed above Follow cultures   ENDOCRINE A:   Stress induced hyperglycemia  P:   Continue SSI and lantus CBG's q4hrs  NEUROLOGIC A: Mechanical Intubation Discomfort  P: Maintain RASS goal 0 to -1 Discontinued Precedex gtt and started Propofol gtt to maintain RASS goal  Prn fentanyl for pain management WUA ongoing Promote family presence at bedside   Updates: Spoke with pts husband the night of 03/04/17 regarding plan of care and pt condition all questions answered.   Critical Care Time devoted to patient care services described in this note is 45 minutes.   Overall, patient is  critically ill, prognosis is guarded.  Patient with Multiorgan failure and at high risk for cardiac arrest and death.    Lucie Leather, M.D.  [facility]  Medical Director [facility] Medical Director [facility] Cardio-Pulmonary Department

## 2017-03-04 NOTE — Progress Notes (Signed)
MEDICATION RELATED CONSULT NOTE  Pharmacy Consult for Electrolyte Monitoring Indication: Hypomagnesemia  Allergies  Allergen Reactions  . Ace Inhibitors   . Compazine [Prochlorperazine Edisylate]   . Lisinopril   . Penicillins     Has patient had a PCN reaction causing immediate rash, facial/tongue/throat swelling, SOB or lightheadedness with hypotension: Unknown Has patient had a PCN reaction causing severe rash involving mucus membranes or skin necrosis: Unknown Has patient had a PCN reaction that required hospitalization: Unknown Has patient had a PCN reaction occurring within the last 10 years: Unknown If all of the above answers are "NO", then may proceed with Cephalosporin use.     Patient Measurements: Height: 5\' 2"  (157.5 cm) Weight: 212 lb 11.9 oz (96.5 kg) IBW/kg (Calculated) : 50.1  Vital Signs: Temp: 98.8 F (37.1 C) (08/28 0600) Temp Source: Core (Comment) (08/28 0400) BP: 95/53 (08/28 0600) Pulse Rate: 56 (08/28 0600) Intake/Output from previous day: 08/27 0701 - 08/28 0700 In: 2068 [I.V.:1176.5; NG/GT:791.5; IV Piggyback:100] Out: 2325 [Urine:2325] Intake/Output from this shift: Total I/O In: 826.1 [I.V.:486.1; NG/GT:240; IV Piggyback:100] Out: 900 [Urine:900]  Labs:  Recent Labs  03/02/17 0741 03/02/17 2059 03/03/17 0445 03/04/17 0500  WBC 23.9*  --  19.7*  --   HGB 8.8*  --  8.5*  --   HCT 25.7*  --  24.6*  --   PLT 225  --  191  --   CREATININE 0.69 0.76 0.72 0.67  MG 2.0 1.9 1.9 2.6*  PHOS  --   --   --  4.5   Estimated Creatinine Clearance: 83.1 mL/min (by C-G formula based on SCr of 0.67 mg/dL).  Sodium (mmol/L)  Date Value  03/04/2017 137  08/16/2016 141   Potassium (mmol/L)  Date Value  03/04/2017 4.5   Calcium (mg/dL)  Date Value  72/53/6644 7.6 (L)    Assessment: 56 y/o F s/p cardiac arrest and hypothermia protocol with hypomagnesemia. Due to high risk of recurrent arrhythmia, cardiology wants a goal K of > or = 4 and Mg  > or = 2.  Plan:  Will replace electrolytes with KCl 40 meq via tube and magnesium sulfate 1 g iv once. Will f/u AM labs.   8/28: 0500 K = 4.5,  Mag = 2.6 .   No additional electrolyte supplementation needed.   Will recheck electrolytes on 8/29 with AM labs.  Scherrie Gerlach, PharmD Clinical Pharmacist 03/04/2017,6:25 AM

## 2017-03-04 NOTE — Progress Notes (Signed)
[facility] - [place] [facility]   PATIENT NAME: Miranda [name]    MR#:  [id number]  DATE OF BIRTH:  19-May-1961  SUBJECTIVE:   Intubated,sedated and on the vent . Somewhat agitated earlier. Now on propofol drip. Patient has been off pressors. Weaning milrinone. REVIEW OF SYSTEMS:   Review of Systems  Unable to perform ROS: Intubated   DRUG ALLERGIES:   Allergies  Allergen Reactions  . Ace Inhibitors   . Compazine [Prochlorperazine Edisylate]   . Lisinopril   . Penicillins     Has patient had a PCN reaction causing immediate rash, facial/tongue/throat swelling, SOB or lightheadedness with hypotension: Unknown Has patient had a PCN reaction causing severe rash involving mucus membranes or skin necrosis: Unknown Has patient had a PCN reaction that required hospitalization: Unknown Has patient had a PCN reaction occurring within the last 10 years: Unknown If all of the above answers are "NO", then may proceed with Cephalosporin use.     VITALS:  Blood pressure (!) 142/74, pulse 72, temperature 100 F (37.8 C), resp. rate (!) 24, height 5\' 2"  (1.575 m), weight 96.5 kg (212 lb 11.9 oz), SpO2 91 %.  PHYSICAL EXAMINATION:   Physical Exam  GENERAL:  56 y.o.-year-old patient lying in the bed with no acute distress. Critically ill EYES: Pupils equal, round, reactive to light and accommodation. No scleral icterus. Extraocular muscles intact.  HEENT: Head atraumatic, normocephalic. Oropharynx and nasopharynx clear. Intubated NECK:  Supple, no jugular venous distention. No thyroid enlargement, no tenderness, NG tube.  LUNGS: Normal breath sounds bilaterally, no wheezing, rales, rhonchi. No use of accessory muscles of respiration.  CARDIOVASCULAR: S1, S2 normal. No murmurs, rubs, or gallops.  ABDOMEN: Soft, nontender, nondistended. Bowel sounds feeble. No organomegaly or mass.  EXTREMITIES: No cyanosis, clubbing or edema b/l.    NEUROLOGIC: Intubated on the  ventilator PSYCHIATRIC:  Sedated and intubated on the ventilator  SKIN: No obvious rash, lesion, or ulcer.   LABORATORY PANEL:  CBC  Recent Labs Lab 03/03/17 0445  WBC 19.7*  HGB 8.5*  HCT 24.6*  PLT 191    Chemistries   Recent Labs Lab 02/26/17 1620  03/04/17 0500  NA 139  < > 137  K 3.6  < > 4.5  CL 108  < > 105  CO2 19*  < > 25  GLUCOSE 319*  < > 138*  BUN 23*  < > 36*  CREATININE 1.16*  < > 0.67  CALCIUM 7.9*  < > 7.6*  MG 2.8*  < > 2.6*  AST 573*  --   --   ALT 416*  --   --   < > = values in this interval not displayed. Cardiac Enzymes  Recent Labs Lab 02/25/17 2240  TROPONINI 0.03*   RADIOLOGY:  Dg Chest Port 1 View  Result Date: 03/04/2017 CLINICAL DATA:  Respiratory failure. EXAM: PORTABLE CHEST 1 VIEW COMPARISON:  03/03/2017. FINDINGS: Endotracheal tube, NG tube, right PICC line in stable position. Heart size normal. Diffuse bilateral pulmonary infiltrates/edema again noted. Slight improvement from prior exam. Small left pleural effusion again noted. No pneumothorax. IMPRESSION: 1. Lines and tubes stable position. 2. Diffuse bilateral pulmonary infiltrates/edema again noted. Slight improvement from prior exam. Small left pleural effusion again noted. Electronically Signed   By: Maisie Fus  Register   On: 03/04/2017 06:42   Dg Chest Port 1 View  Result Date: 03/03/2017 CLINICAL DATA:  Respiratory failure. EXAM: PORTABLE CHEST 1 VIEW COMPARISON:  03/02/2017. FINDINGS: Endotracheal  tube, NG tube, right PICC line stable position. Heart size stable. Diffuse bilateral pulmonary infiltrates. Small left pleural effusion. No pneumothorax . IMPRESSION: 1.  Lines and tubes in stable position. 2. Diffuse bilateral pulmonary infiltrates/ edema. Small left pleural effusion. No interim change. Electronically Signed   By: Maisie Fus  Register   On: 03/03/2017 06:37   ASSESSMENT AND PLAN:   Ms. Cino reportedly had diarrhea over the last 2-3 days for which she was taking up to  12 Imodium per day. She was trying to stay well hydrated and had also stopped taking her blood pressure medications on account of her GI illness. Around 9 PM, Ms. Mcgaughey's husband found the patient seated on the toilet. She attempted to stand up but passed out.  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 Received Lasix x1 today prn  *Acute renal failure secondary to #1 -making good urine -Creatinine stable  *Febrile illness -Low-grade fever, tachycardia elevated white count of 40,000--- 19K -Blood cultures so far negative -Empiric IV vancomycin and Zosyn -C. Difficile negative -chest x-ray shows diffuse pulmonary infiltrate  *altered mental status -Patient currently has periods of agitation when se. Currently on propofol -Seen by Dr. Thad Ranger--- neurology -Recommend EEG and brain MRI.  *Metabolic acidosis secondary to #1  *Hyperglycemia -Patient currently off insulin drip  * Nutrition -started on Tube feeding  *DVT prophylaxis subcutaneous heparin  .Discussed with Dr. Belia Heman Patient is critically ill. Family understands.  Case discussed with Care Management/Social Worker. Management plans discussed with the  family and they are in agreement.  CODE STATUS: Full  DVT Prophylaxis: Subcutaneous heparin  TOTAL CRITICAL TIME TAKING CARE OF THIS PATIENT: 30 minutes.  >50% time spent on counselling and coordination of care  Note: This dictation was prepared with Dragon dictation along with smaller phrase technology. Any transcriptional errors that result from this process are unintentional.  Tarick Parenteau M.D on 03/04/2017 at 2:31 PM  Between  7am to 6pm - Pager - [phone]  After 6pm go to [email or web address] - Social research officer, government  [facility]  Office  [phone]  CC: Primary care physician; Merwyn Katos, MD

## 2017-03-04 NOTE — Progress Notes (Signed)
Pt. Was placed on the trilogy transport vent and transported to MRI, once in MRI the pt. Was placed on the MRI vent for the procedure. After the procedure, the pt. Was placed back on the trilogy vent for the transport back to CCU.

## 2017-03-04 NOTE — Consult Note (Signed)
Reason for Consult:AMS Referring Physician: Kasa  CC: AMS  HPI: Miranda [name] is an 56 y.o. female presenting by EMS after witnessed arrest.  Patient received early bystander CPR after arrest witnessed in the bathroom. Fire department immediately started BLS and did have AED advised 1 shock. They continued CPR. When EMS arrived they continued CPR. They performed 2 defibrillations for V. fib. On the second defibrillation she achieved ROSC. She did receive 2 rounds of epinephrine as well as 300 mg of amiodarone. The patient was intubated with a King airway for agonal respirations. There was concern for head injury.  Patient arrived via EMS with agonal respirations and appeared to be posturing.  She was without purposeful movement. Previous to admission the patient had several days of profuse diarrhea. She had been taking multiple doses of loperamide at home. Had not been taking her blood pressure medications. Patient was admitted to the ICU intubated.  On 8/25 per family was following commands despite some sedation.  Patient was extubated.  Did not tolerate extubation and required reintubation which was difficult.   Patient has been sedated since reintubation but with sedation vacations patient no longer following commands.     Past Medical History:  Diagnosis Date  . Dependent edema    bilateral legs  . Hot flashes   . HTN (hypertension)   . Migraines   . Nausea     Past Surgical History:  Procedure Laterality Date  . APPENDECTOMY  1974  . CHOLECYSTECTOMY  1984  . LEFT HEART CATH AND CORONARY ANGIOGRAPHY N/A 02/26/2017   Procedure: LEFT HEART CATH AND CORONARY ANGIOGRAPHY;  Surgeon: Nelva Bush, MD;  Location: [facility];  Service: Cardiovascular;  Laterality: N/A;  . TONSILLECTOMY  1964    Family History  Problem Relation Age of Onset  . Lymphoma Mother   . Heart disease Father   . Stroke Father   . Hyperlipidemia Father   . Hypertension Father   . Hypertension Sister    . Migraines Sister   . Hypertension Brother     Social History:  reports that she has never smoked. She has never used smokeless tobacco. She reports that she does not drink alcohol or use drugs.  Allergies  Allergen Reactions  . Ace Inhibitors   . Compazine [Prochlorperazine Edisylate]   . Lisinopril   . Penicillins     Has patient had a PCN reaction causing immediate rash, facial/tongue/throat swelling, SOB or lightheadedness with hypotension: Unknown Has patient had a PCN reaction causing severe rash involving mucus membranes or skin necrosis: Unknown Has patient had a PCN reaction that required hospitalization: Unknown Has patient had a PCN reaction occurring within the last 10 years: Unknown If all of the above answers are "NO", then may proceed with Cephalosporin use.     Medications:  I have reviewed the patient's current medications. Prior to Admission:  Prescriptions Prior to Admission  Medication Sig Dispense Refill Last Dose  . acetaminophen (TYLENOL) 500 MG tablet Take 1,000 mg by mouth every 6 (six) hours as needed.   PRN at PRN  . amLODipine (NORVASC) 10 MG tablet Take 10 mg by mouth daily.   UNKNOWN at UNKNOWN  . hydrochlorothiazide (HYDRODIURIL) 25 MG tablet Take 1 tablet (25 mg total) by mouth daily. 90 tablet 3 Past Week at Unknown time  . ibuprofen (ADVIL,MOTRIN) 200 MG tablet Take 600 mg by mouth every 6 (six) hours as needed.   PRN at PRN  . olmesartan (BENICAR) 40 MG tablet  Take 1 tablet (40 mg total) by mouth daily. 90 tablet 3 Past Week at Unknown time  . ondansetron (ZOFRAN-ODT) 4 MG disintegrating tablet Take 4 mg by mouth every 8 (eight) hours as needed for nausea.   PRN at PRN  . Olmesartan-Amlodipine-HCTZ 40-10-25 MG TABS Take 1 tablet by mouth daily. (Patient not taking: Reported on 02/26/2017) 30 tablet 6 Completed Course at Unknown time  . potassium chloride (K-DUR) 10 MEQ tablet Take 1 tablet (10 mEq total) by mouth daily as needed. (Patient not  taking: Reported on 02/26/2017) 30 tablet 6 Completed Course at Unknown time   Scheduled: . ALPRAZolam  1 mg Oral TID  . chlorhexidine gluconate (MEDLINE KIT)  15 mL Mouth Rinse BID  . feeding supplement (PRO-STAT SUGAR FREE 64)  60 mL Per Tube BID  . feeding supplement (VITAL HIGH PROTEIN)  1,000 mL Per Tube Q24H  . heparin  5,000 Units Subcutaneous Q8H  . hydrocortisone sod succinate (SOLU-CORTEF) inj  50 mg Intravenous Q6H  . insulin aspart  2-6 Units Subcutaneous Q4H  . insulin glargine  10 Units Subcutaneous Q24H  . mouth rinse  15 mL Mouth Rinse 10 times per day  . multivitamin  15 mL Per Tube Daily  . sodium chloride flush  10-40 mL Intracatheter Q12H    ROS: Patient unable to provide due to intubation and sedation  Physical Examination: Blood pressure (!) 153/79, pulse 65, temperature 99.1 F (37.3 C), resp. rate (!) 31, height '5\' 2"'  (1.575 m), weight 96.5 kg (212 lb 11.9 oz), SpO2 93 %.  HEENT-  Normocephalic, no lesions, without obvious abnormality.  Normal external eye and conjunctiva.  Normal TM's bilaterally.  Normal auditory canals and external ears. Normal external nose, mucus membranes and septum.  Normal pharynx. Cardiovascular- S1, S2 normal, pulses palpable throughout   Lungs- shallow breath sounds Abdomen- soft, non-tender; bowel sounds normal; no masses,  no organomegaly Extremities- no edema Lymph-no adenopathy palpable Musculoskeletal-no joint tenderness, deformity or swelling Skin-warm and dry, no hyperpigmentation, vitiligo, or suspicious lesions  Neurological Examination  (patient intubated and sedated) Mental Status: Patient does not respond to verbal stimuli.  Does not respond to deep sternal rub.  Does not follow commands.  No verbalizations noted.  Cranial Nerves: II: patient does not respond confrontation bilaterally, pupils right 3 mm, left 3 mm,and reactive bilaterally III,IV,VI: doll's response present bilaterally.  V,VII: corneal reflex reduced  bilaterally  VIII: patient does not respond to verbal stimuli IX,X: gag reflex reduced, XI: trapezius strength unable to test bilaterally XII: tongue strength unable to test Motor: Extremities flaccid throughout.  No spontaneous movement noted.  No purposeful movements noted. Sensory: Does not respond to noxious stimuli in any extremity. Deep Tendon Reflexes:  2+ throughout. Plantars: Mute bilaterally Cerebellar: Unable to perform   Laboratory Studies:   Basic Metabolic Panel:  Recent Labs Lab 02/26/17 0340  03/01/17 0606 03/02/17 0741 03/02/17 2059 03/03/17 0445 03/04/17 0500  NA 140  < > 135 134* 138 136 137  K 2.3*  < > 4.8 4.6 4.3 4.1 4.5  CL 104  < > 108 105 109 108 105  CO2 23  < > 20* '23 22 22 25  ' GLUCOSE 277*  < > 120* 149* 100* 149* 138*  BUN 21*  < > 16 16 27* 30* 36*  CREATININE 1.08*  < > 0.84 0.69 0.76 0.72 0.67  CALCIUM 8.4*  < > 8.3* 8.3* 8.2* 8.1* 7.6*  MG 1.8  < > 2.0 2.0 1.9  1.9 2.6*  PHOS 3.6  --  2.5  --   --   --  4.5  < > = values in this interval not displayed.  Liver Function Tests:  Recent Labs Lab 02/26/17 1620  AST 573*  ALT 416*   No results for input(s): LIPASE, AMYLASE in the last 168 hours. No results for input(s): AMMONIA in the last 168 hours.  CBC:  Recent Labs Lab 02/25/17 2240  02/27/17 0425 02/28/17 0431 03/01/17 0606 03/02/17 0741 03/03/17 0445  WBC 19.2*  < > 27.2* 47.4* 26.5* 23.9* 19.7*  NEUTROABS 7.8*  --   --   --  25.0* 22.4* 17.9*  HGB 13.4  < > 12.2 11.0* 10.5* 8.8* 8.5*  HCT 39.1  < > 35.7 31.6* 30.3* 25.7* 24.6*  MCV 89.5  < > 89.0 87.3 89.4 88.4 89.8  PLT 511*  < > 361 376 266 225 191  < > = values in this interval not displayed.  Cardiac Enzymes:  Recent Labs Lab 02/25/17 2240  TROPONINI 0.03*    BNP: Invalid input(s): POCBNP  CBG:  Recent Labs Lab 03/03/17 1636 03/03/17 1949 03/03/17 2332 03/04/17 0336 03/04/17 0759  GLUCAP 123* 105* 141* 33 102*    Microbiology: Results for  orders placed or performed during the hospital encounter of 02/25/17  MRSA PCR Screening     Status: None   Collection Time: 02/26/17  1:36 AM  Result Value Ref Range Status   MRSA by PCR NEGATIVE NEGATIVE Final    Comment:        The GeneXpert MRSA Assay (FDA approved for NASAL specimens only), is one component of a comprehensive MRSA colonization surveillance program. It is not intended to diagnose MRSA infection nor to guide or monitor treatment for MRSA infections.   Blood culture (routine x 2)     Status: None   Collection Time: 02/26/17  2:08 AM  Result Value Ref Range Status   Specimen Description BLOOD LEFT WRIST  Final   Special Requests   Final    BOTTLES DRAWN AEROBIC AND ANAEROBIC Blood Culture adequate volume   Culture NO GROWTH 5 DAYS  Final   Report Status 03/03/2017 FINAL  Final  Blood culture (routine x 2)     Status: None   Collection Time: 02/26/17  3:40 AM  Result Value Ref Range Status   Specimen Description BLOOD PORT  Final   Special Requests   Final    BOTTLES DRAWN AEROBIC AND ANAEROBIC Blood Culture adequate volume   Culture NO GROWTH 5 DAYS  Final   Report Status 03/03/2017 FINAL  Final  Urine Culture     Status: None   Collection Time: 02/28/17 10:18 AM  Result Value Ref Range Status   Specimen Description URINE, CATHETERIZED  Final   Special Requests Normal  Final   Culture   Final    NO GROWTH Performed at [facility], [address], [place], [place] [address]    Report Status 03/01/2017 FINAL  Final  Culture, respiratory (NON-Expectorated)     Status: None   Collection Time: 02/28/17 10:45 AM  Result Value Ref Range Status   Specimen Description TRACHEAL ASPIRATE  Final   Special Requests NONE  Final   Gram Stain   Final    ABUNDANT WBC PRESENT,BOTH PMN AND MONONUCLEAR FEW SQUAMOUS EPITHELIAL CELLS PRESENT MODERATE GRAM POSITIVE COCCI FEW GRAM NEGATIVE RODS RARE GRAM POSITIVE RODS RARE GRAM NEGATIVE COCCI Performed at [facility],  [address], [place], [place] [address]    Culture ABUNDANT STAPHYLOCOCCUS AUREUS  Final   Report Status 03/02/2017 FINAL  Final   Organism ID, Bacteria STAPHYLOCOCCUS AUREUS  Final      Susceptibility   Staphylococcus aureus - MIC*    CIPROFLOXACIN <=0.5 SENSITIVE Sensitive     ERYTHROMYCIN <=0.25 SENSITIVE Sensitive     GENTAMICIN <=0.5 SENSITIVE Sensitive     OXACILLIN <=0.25 SENSITIVE Sensitive     TETRACYCLINE <=1 SENSITIVE Sensitive     VANCOMYCIN <=0.5 SENSITIVE Sensitive     TRIMETH/SULFA <=10 SENSITIVE Sensitive     CLINDAMYCIN <=0.25 SENSITIVE Sensitive     RIFAMPIN <=0.5 SENSITIVE Sensitive     Inducible Clindamycin NEGATIVE Sensitive     * ABUNDANT STAPHYLOCOCCUS AUREUS    Coagulation Studies: No results for input(s): LABPROT, INR in the last 72 hours.  Urinalysis:  Recent Labs Lab 02/26/17 0005  COLORURINE YELLOW*  LABSPEC 1.038*  PHURINE 6.0  GLUCOSEU 150*  HGBUR SMALL*  BILIRUBINUR NEGATIVE  KETONESUR 5*  PROTEINUR 100*  NITRITE NEGATIVE  LEUKOCYTESUR NEGATIVE    Lipid Panel:     Component Value Date/Time   TRIG 101 03/02/2017 2059    HgbA1C: No results found for: HGBA1C  Urine Drug Screen:     Component Value Date/Time   LABOPIA NONE DETECTED 02/26/2017 0005   COCAINSCRNUR NONE DETECTED 02/26/2017 0005   LABBENZ NONE DETECTED 02/26/2017 0005   AMPHETMU NONE DETECTED 02/26/2017 0005   THCU NONE DETECTED 02/26/2017 0005   LABBARB NONE DETECTED 02/26/2017 0005    Alcohol Level: No results for input(s): ETH in the last 168 hours.  Other results: EKG: 66 bpm.  Imaging: Dg Chest Port 1 View  Result Date: 03/04/2017 CLINICAL DATA:  Respiratory failure. EXAM: PORTABLE CHEST 1 VIEW COMPARISON:  03/03/2017. FINDINGS: Endotracheal tube, NG tube, right PICC line in stable position. Heart size normal. Diffuse bilateral pulmonary infiltrates/edema again noted. Slight improvement from prior exam. Small left pleural effusion again  noted. No pneumothorax. IMPRESSION: 1. Lines and tubes stable position. 2. Diffuse bilateral pulmonary infiltrates/edema again noted. Slight improvement from prior exam. Small left pleural effusion again noted. Electronically Signed   By: Marcello Moores  Register   On: 03/04/2017 06:42   Dg Chest Port 1 View  Result Date: 03/03/2017 CLINICAL DATA:  Respiratory failure. EXAM: PORTABLE CHEST 1 VIEW COMPARISON:  03/02/2017. FINDINGS: Endotracheal tube, NG tube, right PICC line stable position. Heart size stable. Diffuse bilateral pulmonary infiltrates. Small left pleural effusion. No pneumothorax . IMPRESSION: 1.  Lines and tubes in stable position. 2. Diffuse bilateral pulmonary infiltrates/ edema. Small left pleural effusion. No interim change. Electronically Signed   By: Marcello Moores  Register   On: 03/03/2017 06:37     Assessment/Plan: 56 year old female s/p arrest.  Currently not following commands or responsive.  Has had some cardiac rhythm abnormalities.  Initial echo (8/22) with EF of 20-25%.  Repeat on yesterday with normal EF of 55-60%.  Metabolic abnormalities seen initially improved as well, although hepatic function tested initially that was significantly abnormal has not been retested.  Patient has been agitated.  Initial head CT from 8/21 reviewed and shows no acute changes.  Currently on neurological examination despite sedation does not fulfill criteria for brain death.  May be having difficulty clearing sedation if hepatic function remains impaired but further evaluation indicated since there is also a possibility of emboli to the brain due to her initial cardiac instability.  Patient afebrile  Recommendations: 1.  Would recheck hepatic function and ammonia 2.  MRI of the head without contrast 3.  EEG  Case discussed with Dr. Tilman Neat, MD Neurology [phone] 03/04/2017, 10:57 AM

## 2017-03-04 NOTE — Progress Notes (Signed)
eLink Physician-Brief Progress Note Patient Name: Miranda [name] DOB: 06-01-1961 MRN: [id number]   Date of Service  03/04/2017  HPI/Events of Note  Resp alkalosis  eICU Interventions  Vent changes made     Intervention Category Major Interventions: Acid-Base disturbance - evaluation and management  Merwyn Katos 03/04/2017, 4:41 PM

## 2017-03-05 ENCOUNTER — Inpatient Hospital Stay: Payer: BC Managed Care – PPO

## 2017-03-05 LAB — BASIC METABOLIC PANEL
ANION GAP: 7 (ref 5–15)
BUN: 41 mg/dL — ABNORMAL HIGH (ref 6–20)
CHLORIDE: 111 mmol/L (ref 101–111)
CO2: 27 mmol/L (ref 22–32)
Calcium: 8.1 mg/dL — ABNORMAL LOW (ref 8.9–10.3)
Creatinine, Ser: 0.54 mg/dL (ref 0.44–1.00)
GFR calc non Af Amer: >60 mL/min (ref >60)
Glucose, Bld: 117 mg/dL — ABNORMAL HIGH (ref 65–99)
POTASSIUM: 3.9 mmol/L (ref 3.5–5.1)
Sodium: 145 mmol/L (ref 135–145)

## 2017-03-05 LAB — PROCALCITONIN: Procalcitonin: 0.19 ng/mL

## 2017-03-05 LAB — HEPATIC FUNCTION PANEL
ALBUMIN: 2.3 g/dL — AB (ref 3.5–5.0)
ALK PHOS: 117 U/L (ref 38–126)
ALT: 169 U/L — AB (ref 14–54)
AST: 110 U/L — AB (ref 15–41)
Bilirubin, Direct: 0.2 mg/dL (ref 0.1–0.5)
Indirect Bilirubin: 0.4 mg/dL (ref 0.3–0.9)
TOTAL PROTEIN: 6.6 g/dL (ref 6.5–8.1)
Total Bilirubin: 0.6 mg/dL (ref 0.3–1.2)

## 2017-03-05 LAB — GLUCOSE, CAPILLARY
GLUCOSE-CAPILLARY: 80 mg/dL (ref 65–99)
GLUCOSE-CAPILLARY: 87 mg/dL (ref 65–99)
Glucose-Capillary: 90 mg/dL (ref 65–99)
Glucose-Capillary: 98 mg/dL (ref 65–99)

## 2017-03-05 LAB — AMMONIA: Ammonia: 44 umol/L — ABNORMAL HIGH (ref 9–35)

## 2017-03-05 MED ORDER — MIDAZOLAM HCL 2 MG/2ML IJ SOLN: 1.0000 mg | INTRAMUSCULAR | Status: DC | PRN

## 2017-03-05 MED ORDER — MORPHINE SULFATE (PF) 2 MG/ML IV SOLN
1.0000 mg | INTRAVENOUS | Status: AC
  Administered 2017-03-05: 1 mg via INTRAVENOUS
  Filled 2017-03-05: qty 1

## 2017-03-05 MED ORDER — AMLODIPINE BESYLATE 10 MG PO TABS: 10.0000 mg | ORAL_TABLET | Freq: Every day | ORAL | Status: DC

## 2017-03-05 MED ORDER — HYDRALAZINE HCL 20 MG/ML IJ SOLN: 10.0000 mg | INTRAMUSCULAR | Status: DC | PRN

## 2017-03-05 MED ORDER — POTASSIUM CHLORIDE 20 MEQ/15ML (10%) PO SOLN
20.0000 meq | Freq: Once | ORAL | Status: AC
  Administered 2017-03-05: 20 meq
  Filled 2017-03-05: qty 15

## 2017-03-05 MED ORDER — NALOXONE HCL 0.4 MG/ML IJ SOLN
0.2000 mg | Freq: Once | INTRAMUSCULAR | Status: AC
  Administered 2017-03-05: 0.2 mg via INTRAVENOUS
  Filled 2017-03-05: qty 1

## 2017-03-05 MED ORDER — ACETAZOLAMIDE SODIUM 500 MG IJ SOLR
500.0000 mg | Freq: Once | INTRAMUSCULAR | Status: AC
  Administered 2017-03-05: 500 mg via INTRAVENOUS
  Filled 2017-03-05: qty 500

## 2017-03-05 MED ORDER — MORPHINE SULFATE (PF) 2 MG/ML IV SOLN: 2.0000 mg | INTRAVENOUS | Status: DC

## 2017-03-05 NOTE — Progress Notes (Signed)
[facility] - [place] at [facility]   PATIENT NAME: Miranda [name]    MR#:  [id number]  DATE OF BIRTH:  1960/09/10  SUBJECTIVE:   Intubated,sedated and on the vent . Somewhat agitated earlier. Now on propofol drip. Patient is on pressors and tapering sedation, more responsive today.  REVIEW OF SYSTEMS:   Review of Systems  Unable to perform ROS: Intubated   DRUG ALLERGIES:   Allergies  Allergen Reactions  . Ace Inhibitors   . Compazine [Prochlorperazine Edisylate]   . Lisinopril   . Penicillins     Has patient had a PCN reaction causing immediate rash, facial/tongue/throat swelling, SOB or lightheadedness with hypotension: Unknown Has patient had a PCN reaction causing severe rash involving mucus membranes or skin necrosis: Unknown Has patient had a PCN reaction that required hospitalization: Unknown Has patient had a PCN reaction occurring within the last 10 years: Unknown If all of the above answers are "NO", then may proceed with Cephalosporin use.     VITALS:  Blood pressure (!) 155/66, pulse 64, temperature 98.1 F (36.7 C), temperature source Oral, resp. rate (!) 22, height 5\' 2"  (1.575 m), weight 90.6 kg (199 lb 11.8 oz), SpO2 96 %.  PHYSICAL EXAMINATION:   Physical Exam  GENERAL:  56 y.o.-year-old patient lying in the bed with no acute distress. Critically ill EYES: Pupils equal, round, reactive to light . No scleral icterus. Extraocular muscles intact.  HEENT: Head atraumatic, normocephalic. Oropharynx and nasopharynx clear. Intubated NECK:  Supple, no jugular venous distention. No thyroid enlargement, no tenderness, NG tube.  LUNGS: Normal breath sounds bilaterally, no wheezing, rales, rhonchi. No use of accessory muscles of respiration.  CARDIOVASCULAR: S1, S2 normal. No murmurs, rubs, or gallops.  ABDOMEN: Soft, nontender, nondistended. Bowel sounds feeble. No organomegaly or mass.  EXTREMITIES: No cyanosis, clubbing or edema b/l.     NEUROLOGIC: Intubated on the ventilator  PSYCHIATRIC:  Sedated and intubated on the ventilator  SKIN: No obvious rash, lesion, or ulcer.   LABORATORY PANEL:  CBC  Recent Labs Lab 03/03/17 0445  WBC 19.7*  HGB 8.5*  HCT 24.6*  PLT 191    Chemistries   Recent Labs Lab 03/04/17 0500 03/05/17 0404  NA 137 145  K 4.5 3.9  CL 105 111  CO2 25 27  GLUCOSE 138* 117*  BUN 36* 41*  CREATININE 0.67 0.54  CALCIUM 7.6* 8.1*  MG 2.6*  --   AST  --  110*  ALT  --  169*  ALKPHOS  --  117  BILITOT  --  0.6   Cardiac Enzymes No results for input(s): TROPONINI in the last 168 hours. RADIOLOGY:  Mr Brain Wo Contrast  Result Date: 03/04/2017 CLINICAL DATA:  Encephalopathy. Recent diarrhea and cardiac arrest. Not following commands. EXAM: MRI HEAD WITHOUT CONTRAST TECHNIQUE: Multiplanar, multiecho pulse sequences of the brain and surrounding structures were obtained without intravenous contrast. COMPARISON:  Head CT from 7 days ago FINDINGS: Brain: No acute infarction, hemorrhage, hydrocephalus, extra-axial collection or mass lesion. No evidence of brain swelling. Few FLAIR hyperintensities in the cerebral white matter, often attributed to chronic microvascular ischemia. No specific demyelinating pattern. Negative for brain atrophy. Vascular: Major flow voids are preserved. Skull and upper cervical spine: Negative for marrow lesion Sinuses/Orbits: Partial bilateral mastoid opacification in the setting of nasopharyngeal and oropharyngeal fluid levels in this intubated patient. IMPRESSION: No acute finding including evidence of anoxic injury. Electronically Signed   By: Kathrynn Ducking.D.  On: 03/04/2017 15:40   Dg Chest Port 1 View  Result Date: 03/05/2017 CLINICAL DATA:  Post cardiac arrest.  Respiratory failure. EXAM: PORTABLE CHEST 1 VIEW COMPARISON:  03/04/2017 FINDINGS: Endotracheal tube terminates 11 mm above the carina. Retraction by approximately 2 cm is recommended. Enteric  catheter tip is collimated off the image. Right PICC line in stable position with tip overlying the expected position of superior vena cava. Mildly enlarged cardiomediastinal silhouette. No evidence of pneumothorax. Low lung volumes with mixed pattern pulmonary edema. Osseous structures are without acute abnormality. Soft tissues are grossly normal. IMPRESSION: Endotracheal tube terminates 11 mm above the carina. Retraction by approximately 2 cm is recommended. Mixed pattern pulmonary edema. Borderline enlarged cardiomediastinal silhouette. This may be due to portable AP technique, however vascular abnormality cannot be excluded. Please correlate clinically. These results will be called to the ordering clinician or representative by the Radiologist Assistant, and communication documented in the PACS or zVision Dashboard. Electronically Signed   By: Ted Mcalpine M.D.   On: 03/05/2017 12:37   Dg Chest Port 1 View  Result Date: 03/04/2017 CLINICAL DATA:  Respiratory failure. EXAM: PORTABLE CHEST 1 VIEW COMPARISON:  03/03/2017. FINDINGS: Endotracheal tube, NG tube, right PICC line in stable position. Heart size normal. Diffuse bilateral pulmonary infiltrates/edema again noted. Slight improvement from prior exam. Small left pleural effusion again noted. No pneumothorax. IMPRESSION: 1. Lines and tubes stable position. 2. Diffuse bilateral pulmonary infiltrates/edema again noted. Slight improvement from prior exam. Small left pleural effusion again noted. Electronically Signed   By: Maisie Fus  Register   On: 03/04/2017 06:42   ASSESSMENT AND PLAN:   [name] reportedly had diarrhea over the last 2-3 days for which she was taking up to 12 Imodium per day. She was trying to stay well hydrated and had also stopped taking her blood pressure medications on account of her GI illness. Around 9 PM, [name]'s husband found the patient seated on the toilet. She attempted to stand up but passed out.  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 Received Lasix x1 today prn  *Acute renal failure secondary to #1 -making good urine -Creatinine stable  *Febrile illness -Low-grade fever, tachycardia elevated white count of 40,000--- 19K -Blood cultures so far negative -Empiric IV vancomycin and Zosyn -C. Difficile negative -chest x-ray shows diffuse pulmonary infiltrate  *altered mental status -Patient currently has periods of agitation when se. Currently on propofol -Seen by Dr. Thad Ranger -- neurology -Recommend EEG and brain MRI. - No acute injuries. Taper sedation slowly.  *Metabolic acidosis secondary to #1  *Hyperglycemia -Patient currently off insulin drip  * Nutrition -started on Tube feeding  *DVT prophylaxis subcutaneous heparin  .Discussed with Dr. Belia Heman Patient is critically ill. Family understands.  Case discussed with Care Management/Social Worker. Management plans discussed with the  family and they are in agreement.  CODE STATUS: Full  DVT Prophylaxis: Subcutaneous heparin  TOTAL CRITICAL TIME TAKING CARE OF THIS PATIENT: 30 minutes.  >50% time spent on counselling and coordination of care  Note: This dictation was prepared with Dragon dictation along with smaller phrase technology. Any transcriptional errors that result from this process are unintentional.  Altamese Dilling M.D on 03/05/2017 at  8:28 PM  Between 7am to 6pm - Pager - [phone]  After 6pm go to [email or web address] - Social research officer, government  [facility]  Office  [phone]  CC: Primary care physician; Merwyn Katos, MD

## 2017-03-05 NOTE — Progress Notes (Signed)
Per Sonda Rumble NP's order, the pt.'s ETT was pulled back 2 cm. It is now 20 cm at the lip.

## 2017-03-05 NOTE — Progress Notes (Signed)
CH followed up with PT and her hu   03/04/17 1815  Clinical Encounter Type  Visited With Patient and family together  Visit Type Follow-up  Referral From Nurse  Consult/Referral To Chaplain  Spiritual Encounters  Spiritual Needs Prayer;Emotional;Grief support  sband. CH offered emotional support and prayed for PT and her family.

## 2017-03-05 NOTE — Progress Notes (Signed)
Right femoral line removed per MD order.  Pressure held for 5 minutes.  No bleeding noted.  Pressure dressing applied.  Blake Divine, RN

## 2017-03-05 NOTE — Progress Notes (Signed)
Pt continues to have tachypnea despite increases in the propofol gtt.  Binsy, NP to bedside.

## 2017-03-05 NOTE — Progress Notes (Signed)
Pt agitated, attempting to pull ETT. Pt grasped ETT and would not let go. Patient's daughter, RT, RN, NT, and charge RN at bedside trying to prevent patient from pulling out ETT. Call placed to [name] with no answer.  SBT trial lasted approximately 20 mins per prior order. Pt extubated by RT at 0440.

## 2017-03-05 NOTE — Progress Notes (Signed)
Pt was off propofol from 8am until 1800.  She was on CPAP 5 40-50% fio2 during that time. She maintained sats greater than 90. At 1800 she was placed back on propofol gtt and on PSV due to increased work of breathing, tachypnea, and increased restlessness.  She was able to move all extremities but not to command. She could open eyes spontaneously but not appear to focus or track movements. She has strong cough and gag. She does at times reach toward noxious stimuli (Ett during suctioning) with gross motor. She does not grab at lines or tubing.  Husband currently at bedside. Dr Thad Ranger was here today not long after Narcan was given. Both Dr Thad Ranger and Dr Belia Heman spoke with pt's husband at length answering questions.

## 2017-03-05 NOTE — Progress Notes (Signed)
Patient wide awake, agitated, and repeatedly pulling off gown and arctic sun pads.  Patient restless and thrashing around in bed and pulling at lines.  Roselie Skinner, NP about taking arctic sun pads off at this time.  Per [name], ok to remove arctic sun pads and d/c therapy.

## 2017-03-05 NOTE — Progress Notes (Signed)
CH followed up with PT and her husband. CH offered support.   03/05/17 0740  Clinical Encounter Type  Visited With Patient and family together  Visit Type Follow-up  Referral From Nurse  Consult/Referral To Chaplain  Spiritual Encounters  Spiritual Needs Emotional

## 2017-03-05 NOTE — Progress Notes (Signed)
Notified [name], NP of ammonia level of 44.  No new orders at this time.  Blake Divine, RN

## 2017-03-05 NOTE — Progress Notes (Addendum)
RT at bedside. SBT initiated.

## 2017-03-05 NOTE — Progress Notes (Signed)
Subjective: Patient being tapered off sedatives.  Starting to show more responsiveness.  Family at the bedside.    Objective: Current vital signs: BP (!) 172/93   Pulse 72   Temp 98.4 F (36.9 C) (Axillary)   Resp (!) 29   Ht _0  (1.575 m)   Wt 90.6 kg (199 lb 11.8 oz)   SpO2 95%   BMI 36.53 kg/m  Vital signs in last 24 hours: Temp:  [98.4 F (36.9 C)-100.4 F (38 C)] 98.4 F (36.9 C) (08/29 0800) Pulse Rate:  [44-76] 72 (08/29 1030) Resp:  [18-38] 29 (08/29 1030) BP: (111-180)/(60-152) 172/93 (08/29 1030) SpO2:  [89 %-100 %] 95 % (08/29 1030) Arterial Line BP: (135-195)/(56-88) 181/74 (08/29 0430) FiO2 (%):  [45 %-60 %] 60 % (08/29 1000) Weight:  [90.6 kg (199 lb 11.8 oz)] 90.6 kg (199 lb 11.8 oz) (08/29 0411)  Intake/Output from previous day: 08/28 0701 - 08/29 0700 In: 1245.9 [I.V.:615.9; NG/GT:580; IV Piggyback:50] Out: 2925 [Urine:2675; Stool:250] Intake/Output this shift: Total I/O In: 44.6 [I.V.:24.6; NG/GT:20] Out: 155 [Urine:155] Nutritional status:    Neurologic Exam: Patient remains intubated.  Does not follow commands.  Moves extremities spontaneously.    Lab Results: Basic Metabolic Panel:  Recent Labs Lab 03/01/17 0606 03/02/17 0741 03/02/17 2059 03/03/17 0445 03/04/17 0500 03/05/17 0404  NA 135 134* 138 136 137 145  K 4.8 4.6 4.3 4.1 4.5 3.9  CL 108 105 109 108 105 111  CO2 20* _1 GLUCOSE 120* 149* 100* 149* 138* 117*  BUN 16 16 27* 30* 36* 41*  CREATININE 0.84 0.69 0.76 0.72 0.67 0.54  CALCIUM 8.3* 8.3* 8.2* 8.1* 7.6* 8.1*  MG 2.0 2.0 1.9 1.9 2.6*  --   PHOS 2.5  --   --   --  4.5  --     Liver Function Tests:  Recent Labs Lab 02/26/17 1620 03/05/17 0404  AST 573* 110*  ALT 416* 169*  ALKPHOS  --  117  BILITOT  --  0.6  PROT  --  6.6  ALBUMIN  --  2.3*   No results for input(s): LIPASE, AMYLASE in the last 168 hours.  Recent Labs Lab 03/05/17 0404  AMMONIA 44*    CBC:  Recent Labs Lab 02/27/17 0425  02/28/17 0431 03/01/17 0606 03/02/17 0741 03/03/17 0445  WBC 27.2* 47.4* 26.5* 23.9* 19.7*  NEUTROABS  --   --  25.0* 22.4* 17.9*  HGB 12.2 11.0* 10.5* 8.8* 8.5*  HCT 35.7 31.6* 30.3* 25.7* 24.6*  MCV 89.0 87.3 89.4 88.4 89.8  PLT 361 376 266 225 191    Cardiac Enzymes: No results for input(s): CKTOTAL, CKMB, CKMBINDEX, TROPONINI in the last 168 hours.  Lipid Panel:  Recent Labs Lab 02/26/17 1235 03/02/17 2059  TRIG 161* 101    CBG:  Recent Labs Lab 03/04/17 1527 03/04/17 1942 03/04/17 2337 03/05/17 0342 03/05/17 0716  GLUCAP 91 93 89 90 98    Microbiology: Results for orders placed or performed during the hospital encounter of 02/25/17  MRSA PCR Screening     Status: None   Collection Time: 02/26/17  1:36 AM  Result Value Ref Range Status   MRSA by PCR NEGATIVE NEGATIVE Final    Comment:        The GeneXpert MRSA Assay (FDA approved for NASAL specimens only), is one component of a comprehensive MRSA colonization surveillance program. It is not intended to diagnose MRSA infection nor to guide or monitor treatment  for MRSA infections.   Blood culture (routine x 2)     Status: None   Collection Time: 02/26/17  2:08 AM  Result Value Ref Range Status   Specimen Description BLOOD LEFT WRIST  Final   Special Requests   Final    BOTTLES DRAWN AEROBIC AND ANAEROBIC Blood Culture adequate volume   Culture NO GROWTH 5 DAYS  Final   Report Status 03/03/2017 FINAL  Final  Blood culture (routine x 2)     Status: None   Collection Time: 02/26/17  3:40 AM  Result Value Ref Range Status   Specimen Description BLOOD PORT  Final   Special Requests   Final    BOTTLES DRAWN AEROBIC AND ANAEROBIC Blood Culture adequate volume   Culture NO GROWTH 5 DAYS  Final   Report Status 03/03/2017 FINAL  Final  Urine Culture     Status: None   Collection Time: 02/28/17 10:18 AM  Result Value Ref Range Status   Specimen Description URINE, CATHETERIZED  Final   Special Requests  Normal  Final   Culture   Final    NO GROWTH Performed at [facility], [place] [address], [place], [place] [address]    Report Status 03/01/2017 FINAL  Final  Culture, respiratory (NON-Expectorated)     Status: None   Collection Time: 02/28/17 10:45 AM  Result Value Ref Range Status   Specimen Description TRACHEAL ASPIRATE  Final   Special Requests NONE  Final   Gram Stain   Final    ABUNDANT WBC PRESENT,BOTH PMN AND MONONUCLEAR FEW SQUAMOUS EPITHELIAL CELLS PRESENT MODERATE GRAM POSITIVE COCCI FEW GRAM NEGATIVE RODS RARE GRAM POSITIVE RODS RARE GRAM NEGATIVE COCCI Performed at [facility], [place] [address] [place], [place] [address]    Culture ABUNDANT STAPHYLOCOCCUS AUREUS  Final   Report Status 03/02/2017 FINAL  Final   Organism ID, Bacteria STAPHYLOCOCCUS AUREUS  Final      Susceptibility   Staphylococcus aureus - MIC*    CIPROFLOXACIN <=0.5 SENSITIVE Sensitive     ERYTHROMYCIN <=0.25 SENSITIVE Sensitive     GENTAMICIN <=0.5 SENSITIVE Sensitive     OXACILLIN <=0.25 SENSITIVE Sensitive     TETRACYCLINE <=1 SENSITIVE Sensitive     VANCOMYCIN <=0.5 SENSITIVE Sensitive     TRIMETH/SULFA <=10 SENSITIVE Sensitive     CLINDAMYCIN <=0.25 SENSITIVE Sensitive     RIFAMPIN <=0.5 SENSITIVE Sensitive     Inducible Clindamycin NEGATIVE Sensitive     * ABUNDANT STAPHYLOCOCCUS AUREUS  C difficile quick scan w PCR reflex     Status: None   Collection Time: 03/04/17 11:40 AM  Result Value Ref Range Status   C Diff antigen NEGATIVE NEGATIVE Final   C Diff toxin NEGATIVE NEGATIVE Final   C Diff interpretation No C. difficile detected.  Final    Coagulation Studies: No results for input(s): LABPROT, INR in the last 72 hours.  Imaging: Mr Brain Wo Contrast  Result Date: 03/04/2017 CLINICAL DATA:  Encephalopathy. Recent diarrhea and cardiac arrest. Not following commands. EXAM: MRI HEAD WITHOUT CONTRAST TECHNIQUE: Multiplanar, multiecho pulse sequences of the brain and  surrounding structures were obtained without intravenous contrast. COMPARISON:  Head CT from 7 days ago FINDINGS: Brain: No acute infarction, hemorrhage, hydrocephalus, extra-axial collection or mass lesion. No evidence of brain swelling. Few FLAIR hyperintensities in the cerebral white matter, often attributed to chronic microvascular ischemia. No specific demyelinating pattern. Negative for brain atrophy. Vascular: Major flow voids are preserved. Skull and upper cervical  spine: Negative for marrow lesion Sinuses/Orbits: Partial bilateral mastoid opacification in the setting of nasopharyngeal and oropharyngeal fluid levels in this intubated patient. IMPRESSION: No acute finding including evidence of anoxic injury. Electronically Signed   By: Monte Fantasia M.D.   On: 03/04/2017 15:40   Dg Chest Port 1 View  Result Date: 03/04/2017 CLINICAL DATA:  Respiratory failure. EXAM: PORTABLE CHEST 1 VIEW COMPARISON:  03/03/2017. FINDINGS: Endotracheal tube, NG tube, right PICC line in stable position. Heart size normal. Diffuse bilateral pulmonary infiltrates/edema again noted. Slight improvement from prior exam. Small left pleural effusion again noted. No pneumothorax. IMPRESSION: 1. Lines and tubes stable position. 2. Diffuse bilateral pulmonary infiltrates/edema again noted. Slight improvement from prior exam. Small left pleural effusion again noted. Electronically Signed   By: Marcello Moores  Register   On: 03/04/2017 06:42    Medications:  I have reviewed the patient's current medications. Scheduled: . amLODipine  10 mg Oral Daily  . chlorhexidine gluconate (MEDLINE KIT)  15 mL Mouth Rinse BID  . feeding supplement (PRO-STAT SUGAR FREE 64)  60 mL Per Tube BID  . feeding supplement (VITAL HIGH PROTEIN)  1,000 mL Per Tube Q24H  . heparin  5,000 Units Subcutaneous Q8H  . hydrocortisone sod succinate (SOLU-CORTEF) inj  50 mg Intravenous Q12H  . insulin aspart  2-6 Units Subcutaneous Q4H  . insulin glargine  10  Units Subcutaneous Q24H  . mouth rinse  15 mL Mouth Rinse 10 times per day  . multivitamin  15 mL Per Tube Daily  . potassium chloride  20 mEq Per Tube Once  . sodium chloride flush  10-40 mL Intracatheter Q12H    Assessment/Plan: Patient starting to alert.  MRI of the brain reviewed and shows no acute changes or changes associated with anoxia.  EEG consistent with medications.    Will continue to follow with you   LOS: 7 days   Alexis Goodell, MD Neurology [phone] 03/05/2017  11:27 AM

## 2017-03-05 NOTE — Progress Notes (Signed)
CH made a follow up visit. Husband, son and family friend are bedside. Medical team is addressing the family on the current status. Pt is slowly being awakened and family seems guardedly optimistic. CH is available for follow up as needed.    03/05/17 1300  Clinical Encounter Type  Visited With Patient;Patient and family together  Visit Type Follow-up  Consult/Referral To Chaplain  Spiritual Encounters  Spiritual Needs Prayer;Emotional

## 2017-03-05 NOTE — Progress Notes (Signed)
Patient more alert and awake at this time; called [name], NP to bedside to complete assessment.  Patient following commands and answering yes/no questions appropriately. Decision made for SBT between 0400-0500.  Will continue to monitor.

## 2017-03-05 NOTE — Progress Notes (Signed)
[name] called at this time concerning pt's constant agitation, pulling lines and gown, reaching for ETT, and restless in bed.  Requested order for SBT to start at this time. Verbal order from [name] for SBT for 15-20 mins and then to extubate.

## 2017-03-05 NOTE — Progress Notes (Signed)
Patient Name: Miranda [name] Date of Encounter: 03/05/2017  Primary Cardiologist: New to [facility] - consult by End  Hospital Problem List     Principal Problem:   Cardiac arrest with ventricular fibrillation [facility]) Active Problems:   Cardiac arrest ([place])   Acute respiratory failure ([place])   Hypokalemia   Encounter for central line placement   Cardiogenic shock ([place])   Acute pulmonary edema ([facility])   Anoxic encephalopathy ([place])   Bradycardia     Subjective   Remains intubated. On sedation. Bradycardic overnight into the 40s bpm.   Inpatient Medications    Scheduled Meds: . ALPRAZolam  1 mg Oral TID  . chlorhexidine gluconate (MEDLINE KIT)  15 mL Mouth Rinse BID  . feeding supplement (PRO-STAT SUGAR FREE 64)  60 mL Per Tube BID  . feeding supplement (VITAL HIGH PROTEIN)  1,000 mL Per Tube Q24H  . heparin  5,000 Units Subcutaneous Q8H  . hydrocortisone sod succinate (SOLU-CORTEF) inj  50 mg Intravenous Q12H  . insulin aspart  2-6 Units Subcutaneous Q4H  . insulin glargine  10 Units Subcutaneous Q24H  . mouth rinse  15 mL Mouth Rinse 10 times per day  . multivitamin  15 mL Per Tube Daily  . sodium chloride flush  10-40 mL Intracatheter Q12H   Continuous Infusions: . cefTRIAXone (ROCEPHIN)  IV Stopped (03/04/17 1121)  . dextrose    . epinephrine Stopped (02/28/17 1600)  . famotidine (PEPCID) IV Stopped (03/04/17 2244)  . fentaNYL infusion INTRAVENOUS Stopped (03/04/17 0747)  . norepinephrine (LEVOPHED) Adult infusion Stopped (03/04/17 0747)  . propofol (DIPRIVAN) infusion 45 mcg/kg/min (03/05/17 0733)   PRN Meds: acetaminophen, atropine, dextrose, sennosides **AND** docusate, HYDROmorphone (DILAUDID) injection, levalbuterol, midazolam, naLOXone (NARCAN)  injection, pneumococcal 23 valent vaccine, sodium chloride flush, vecuronium   Vital Signs    Vitals:   03/05/17 0500 03/05/17 0530 03/05/17 0600 03/05/17 0630  BP: (!) 141/61 (!) 153/70 135/65 (!) 156/68  Pulse: (!) 52  (!) 54 (!) 52 (!) 50  Resp: (!) 25 (!) 27 (!) 25 (!) 30  Temp:      TempSrc:      SpO2: 95% 96% 97% 91%  Weight:      Height:        Intake/Output Summary (Last 24 hours) at 03/05/17 0740 Last data filed at 03/05/17 0600  Gross per 24 hour  Intake          1201.28 ml  Output             2925 ml  Net         -1723.72 ml   Filed Weights   03/03/17 0500 03/04/17 0451 03/05/17 0411  Weight: 204 lb 9.4 oz (92.8 kg) 212 lb 11.9 oz (96.5 kg) 199 lb 11.8 oz (90.6 kg)    Physical Exam    GEN: Critically ill appearing, in no acute distress.  HEENT: Grossly normal.  Neck: Supple, no JVD, carotid bruits, or masses. Cardiac: Bradycardic, no murmurs, rubs, or gallops. No clubbing, cyanosis, edema.  Radials/DP/PT 2+ and equal bilaterally.  Respiratory:  Diminished breath sounds bilaterally. Intubated. GI: Soft, nontender, nondistended, BS + x 4. MS: no deformity or atrophy. Skin: warm and dry, no rash. Neuro:  Intubated and sedated. Psych: Intubated and sedatedt.  Labs    CBC  Recent Labs  03/02/17 0741 03/03/17 0445  WBC 23.9* 19.7*  NEUTROABS 22.4* 17.9*  HGB 8.8* 8.5*  HCT 25.7* 24.6*  MCV 88.4 89.8  PLT 225 191  Basic Metabolic Panel  Recent Labs  03/03/17 0445 03/04/17 0500 03/05/17 0404  NA 136 137 145  K 4.1 4.5 3.9  CL 108 105 111  CO2 '22 25 27  ' GLUCOSE 149* 138* 117*  BUN 30* 36* 41*  CREATININE 0.72 0.67 0.54  CALCIUM 8.1* 7.6* 8.1*  MG 1.9 2.6*  --   PHOS  --  4.5  --    Liver Function Tests  Recent Labs  03/05/17 0404  AST 110*  ALT 169*  ALKPHOS 117  BILITOT 0.6  PROT 6.6  ALBUMIN 2.3*   No results for input(s): LIPASE, AMYLASE in the last 72 hours. Cardiac Enzymes No results for input(s): CKTOTAL, CKMB, CKMBINDEX, TROPONINI in the last 72 hours. BNP Invalid input(s): POCBNP D-Dimer No results for input(s): DDIMER in the last 72 hours. Hemoglobin A1C No results for input(s): HGBA1C in the last 72 hours. Fasting Lipid  Panel  Recent Labs  03/02/17 2059  TRIG 101   Thyroid Function Tests No results for input(s): TSH, T4TOTAL, T3FREE, THYROIDAB in the last 72 hours.  Invalid input(s): FREET3  Telemetry    Sinus bradycardia, 40s bpm - Personally Reviewed  ECG    n/a - Personally Reviewed  Radiology    Mr Brain Wo Contrast  Result Date: 03/04/2017 CLINICAL DATA:  Encephalopathy. Recent diarrhea and cardiac arrest. Not following commands. EXAM: MRI HEAD WITHOUT CONTRAST TECHNIQUE: Multiplanar, multiecho pulse sequences of the brain and surrounding structures were obtained without intravenous contrast. COMPARISON:  Head CT from 7 days ago FINDINGS: Brain: No acute infarction, hemorrhage, hydrocephalus, extra-axial collection or mass lesion. No evidence of brain swelling. Few FLAIR hyperintensities in the cerebral white matter, often attributed to chronic microvascular ischemia. No specific demyelinating pattern. Negative for brain atrophy. Vascular: Major flow voids are preserved. Skull and upper cervical spine: Negative for marrow lesion Sinuses/Orbits: Partial bilateral mastoid opacification in the setting of nasopharyngeal and oropharyngeal fluid levels in this intubated patient. IMPRESSION: No acute finding including evidence of anoxic injury. Electronically Signed   By: Monte Fantasia M.D.   On: 03/04/2017 15:40   Dg Chest Port 1 View  Result Date: 03/04/2017 CLINICAL DATA:  Respiratory failure. EXAM: PORTABLE CHEST 1 VIEW COMPARISON:  03/03/2017. FINDINGS: Endotracheal tube, NG tube, right PICC line in stable position. Heart size normal. Diffuse bilateral pulmonary infiltrates/edema again noted. Slight improvement from prior exam. Small left pleural effusion again noted. No pneumothorax. IMPRESSION: 1. Lines and tubes stable position. 2. Diffuse bilateral pulmonary infiltrates/edema again noted. Slight improvement from prior exam. Small left pleural effusion again noted. Electronically Signed   By:  Marcello Moores  Register   On: 03/04/2017 06:42    Cardiac Studies   TTE 03/03/2017: Study Conclusions  - Left ventricle: The cavity size was normal. There was mild concentric hypertrophy. Systolic function was normal. The estimated ejection fraction was in the range of 55% to 60%. Wall motion was normal; there were no regional wall motion abnormalities. - Aortic valve: There was mild regurgitation. - Pulmonary arteries: Systolic pressure was mildly to moderately increased. PA peak pressure: 46 mm Hg (S).  Impressions:  - Limited echo to evaluate ejection fraction. When compared to recent echo, LV systolic function normalized completely with improvement in ejection fraction of from 10% to 55%.  LHC 02/26/2017: Conclusion   Conclusions: 1. No angiographically significant coronary artery disease. 2. Severely reduced left ventricular contraction (<25%) with moderately elevated left ventricular filling pressure (LVEDP 28 mmHg).  Recommendations: 1. Hypothermia protocol per hospitalist and ICU  teams. 2. Check magnesium level; replete electrolytes for goal potassium and magnesium greater than 4.0 and 2.0, respectively. 3. Consider gentle diuresis, given elevated LVEDP and severely reduced LVEF. 4. Obtain transthoracic echocardiogram    TTE 02/26/2017: Study Conclusions  - Left ventricle: The cavity size was mildly dilated. Systolic function was severely reduced. The estimated ejection fraction was <20%. Diffuse hypokinesis. Regional wall motion abnormalities cannot be excluded. The study is not technically sufficient to allow evaluation of LV diastolic function. - Mitral valve: There was mild regurgitation. - Right ventricle: Systolic function was mildly reduced. - Pulmonary arteries: Systolic pressure could not be accurately estimated. - Inferior vena cava: The vessel was normal in size. The respirophasic diameter changes were in the normal  range (>= 50%), consistent with normal central venous pressure.   Patient Profile     56 y.o. female with history of HTN, migraine disorder, and dependent edema who has had diarrhea for the past 2-3 days, taking up to 12 Imodium daily presented to [facility] with VF arrest. Cardiac cath showed no significant CAD.  Assessment & Plan    1. VF arrest: -Inciting event is still somewhat unclear, though given recent diarrhea and significant electrolyte abnormalities on admission, it has been suspected that she may have had a primary arrhythmogenic event. Catheterization showed no significant coronary artery disease. 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  -Continue supportive care  2. Acute hypoxic respiratory failure: -Remains intubated  -Sedation weaned -Avoid Precedex due to cardiomyopathy and bradycardia -Wean oxygen as able per PCCM   3. Acute systolic CHF: -Severely reduced LVEF by LV gram -Echo 8/22 with an EF of 10-15% with global hypokinesis -Repeat echo 8/27 showed improved EF to 55-60%, no RWMA -Off milrinone as of 8/28 -She does continue to appear volume overloaded and had elevated PASP on repeat echo -Diurese as able  -Has not been on beta blocker, ACE inhibitor/ARB/ANRI due to hypotension  4. Cardiogenic shock: -Resolved  5. AKI: - Resolved  Signed, Christell Faith, PA-C [place] Pager: [phone] 03/05/2017, 7:40 AM   Attending Note Patient seen and examined, agree with detailed note above,  Patient presentation and plan discussed on rounds.   No significant events overnight, Husband at the bedside, somewhat agitated that day lot it was given last night for "blood pressure". Concerned about other sedation medications Somewhat anxious this morning, has an interview with [employer] to get a job Does not want any  medication titration, weaning off sedation medications until he is around after his job interview  Still on the ventilator, propofol in place, Maintaining normal sinus rhythm Echocardiogram reviewed, dramatic improvement in ejection fraction greater than 55%  On physical exam unable to estimate JVP, coarse breath sounds inspiratory and expiratory, heart sounds regular with no murmurs appreciated, abdomen soft nondistended, no significant lower extremity edema  Lab work reviewed showing potassium 3.9, normal renal function, albumin 2.3, elevated AST ALT Liver numbers are improving compared to last week when AST was 573, ALT 416 Ammonia elevated 44  Neuro following, No acute findings of anoxic injury on MRI EEG results pending  --- VF arrest Full cardiac recovery by echocardiogram, normal ejection fraction Current neurologic state unclear She did complete cooling protocol, rewarmed, periods of agitation, on propofol Neurology following Discussed with intensivist, plan is to wean sedation when husband permits. He does not want any changes while he is  away from the bedside. He has job interview this morning  --acute systolic CHF Now with dramatic recovery of ejection fraction Echocardiogram of elevated right heart pressures, consider gentle Lasix given normal renal function   Greater than 50% was spent in counseling and coordination of care with patient Total encounter time 35 minutes or more   Signed: Esmond Plants  M.D., Ph.D. [facility]

## 2017-03-05 NOTE — Progress Notes (Signed)
Pt biting ETT repeatedly - low tidal volumes.  [place], RT, place oral airway.  Pt continues to struggle with tachypnea.

## 2017-03-05 NOTE — Progress Notes (Signed)
MEDICATION RELATED CONSULT NOTE  Pharmacy Consult for Electrolyte Monitoring Indication: Hypomagnesemia  Allergies  Allergen Reactions  . Ace Inhibitors   . Compazine [Prochlorperazine Edisylate]   . Lisinopril   . Penicillins     Has patient had a PCN reaction causing immediate rash, facial/tongue/throat swelling, SOB or lightheadedness with hypotension: Unknown Has patient had a PCN reaction causing severe rash involving mucus membranes or skin necrosis: Unknown Has patient had a PCN reaction that required hospitalization: Unknown Has patient had a PCN reaction occurring within the last 10 years: Unknown If all of the above answers are "NO", then may proceed with Cephalosporin use.     Patient Measurements: Height: 5\' 2"  (157.5 cm) Weight: 199 lb 11.8 oz (90.6 kg) IBW/kg (Calculated) : 50.1  Vital Signs: Temp: 100.3 F (37.9 C) (08/29 1400) Temp Source: Axillary (08/29 1130) BP: 158/80 (08/29 1230) Pulse Rate: 74 (08/29 1230) Intake/Output from previous day: 08/28 0701 - 08/29 0700 In: 1245.9 [I.V.:615.9; NG/GT:580; IV Piggyback:50] Out: 2925 [Urine:2675; Stool:250] Intake/Output from this shift: Total I/O In: 144.6 [I.V.:24.6; NG/GT:20; IV Piggyback:100] Out: 715 [Urine:715]  Labs:  Recent Labs  03/02/17 2059 03/03/17 0445 03/04/17 0500 03/05/17 0404  WBC  --  19.7*  --   --   HGB  --  8.5*  --   --   HCT  --  24.6*  --   --   PLT  --  191  --   --   CREATININE 0.76 0.72 0.67 0.54  MG 1.9 1.9 2.6*  --   PHOS  --   --  4.5  --   ALBUMIN  --   --   --  2.3*  PROT  --   --   --  6.6  AST  --   --   --  110*  ALT  --   --   --  169*  ALKPHOS  --   --   --  117  BILITOT  --   --   --  0.6  BILIDIR  --   --   --  0.2  IBILI  --   --   --  0.4   Estimated Creatinine Clearance: 80.2 mL/min (by C-G formula based on SCr of 0.54 mg/dL).  Sodium (mmol/L)  Date Value  03/05/2017 145  08/16/2016 141   Potassium (mmol/L)  Date Value  03/05/2017 3.9    Calcium (mg/dL)  Date Value  95/63/8756 8.1 (L)   Albumin (g/dL)  Date Value  43/32/9518 2.3 (L)    Assessment: 56 y/o F s/p cardiac arrest and hypothermia protocol with hypomagnesemia. Due to high risk of recurrent arrhythmia, cardiology wants a goal K of > or = 4 and Mg > or = 2.  Plan:  KCl 20 meq per tube once and will f/u AM labs.   Valentina Gu, PharmD Clinical Pharmacist 03/05/2017,3:11 PM

## 2017-03-05 NOTE — Progress Notes (Signed)
Per [name], NP recommendations - continue to use the propofol to manage pt's agitation and tachypnea.  Call for SBP>195.  Blake Divine, RN

## 2017-03-05 NOTE — Progress Notes (Signed)
PULMONARY / CRITICAL CARE MEDICINE   Name: Miranda [name] MRN: [id number] DOB: [birth year]    ADMISSION DATE:  02/25/2017   PT PROFILE: 56 F suffered OOH arrest with at least 20 mins ACLS/CPR. Initial identified rhythm was VF. Underwent defib X 3 before ROSC. LHC revealed no CAD, severely reduced LV function and moderately elevated LVEDP. TTM 33 degree protocol initiated.  MAJOR EVENTS/TEST RESULTS: 08/21 CT head: no acute findings 08/21 CTA chest: no PE. Diffuse bilateral opacities 08/22 LHC: no CAD, severely reduced LV function and moderately elevated LVEDP 08/22 Echocardiogram:  08/22 Refractory shock despite norepinephrine and phenylephrine infusions. Epinephrine infusion initiated 08/23 Rewarming initiated. Sinus bradyarrhythmias. Amiodarone DC'd. Milrinone initiated. Improved gas exchange. Off of phenylephrine and norepi. Weaning of of epinephrine 08/25 Pt failed extubation requiring emergent reintubation within minutes, difficult reintubation  08/27 failed weaning trials 08/28 failed wean trials   INDWELLING DEVICES:: ETT 08/21 >>  L IJ CVL 08/22 >>  R femoral A-line 08/22 >>   MICRO DATA: MRSA PCR 08/22 >>negative  Blood 08/22 >>negative  Urine 08/24>>negative Respiratory 08/24>>staph aureus   ANTIMICROBIALS:  Zosyn 08/24>> Vancomycin 08/24>>  SUBJECTIVE:  Patient remains critically ill On full vent support Lasix as tolerated FiO2 down to 45% Husband at bedside and updated Patient failed weaning trials yesterday we will try again today Will stop propofol-assess for narcan when husband at bedside  VITAL SIGNS: BP (!) 156/68 (BP Location: Left Arm)   Pulse (!) 50   Temp (!) 100.4 F (38 C) (Oral)   Resp (!) 30   Ht 5\' 2"  (1.575 m)   Wt 199 lb 11.8 oz (90.6 kg)   SpO2 91%   BMI 36.53 kg/m   HEMODYNAMICS:    VENTILATOR SETTINGS: Vent Mode: PCV FiO2 (%):  [45 %-60 %] 45 % Set Rate:  [16 bmp-20 bmp] 16 bmp Vt Set:  [420 mL] 420 mL PEEP:  [5 cmH20] 5  cmH20 Pressure Support:  [20 cmH20] 20 cmH20  INTAKE / OUTPUT: I/O last 3 completed shifts: In: 2083.9 [I.V.:1113.9; NG/GT:820; IV Piggyback:150] Out: 3825 [Urine:3575; Stool:250] PHYSICAL EXAMINATION: General: acutely ill appearing Caucasian female, intubated NAD  Neuro: sedated, withdraws from painful stimulation, PERRL Cardiovascular: irreg, bradycardia with junctional escape beats and PACs, no M/R/G Lungs: rhonchi and crackles throughout, even, non labored; no wheezes or rales  Abdomen: hypoactive BS x4, soft, obese, non distended  Ext: 2+ generalized edema, moves all extremities    LABS:  BMET  Recent Labs Lab 03/03/17 0445 03/04/17 0500 03/05/17 0404  NA 136 137 145  K 4.1 4.5 3.9  CL 108 105 111  CO2 22 25 27   BUN 30* 36* 41*  CREATININE 0.72 0.67 0.54  GLUCOSE 149* 138* 117*    Electrolytes  Recent Labs Lab 03/01/17 0606  03/02/17 2059 03/03/17 0445 03/04/17 0500 03/05/17 0404  CALCIUM 8.3*  < > 8.2* 8.1* 7.6* 8.1*  MG 2.0  < > 1.9 1.9 2.6*  --   PHOS 2.5  --   --   --  4.5  --   < > = values in this interval not displayed.  CBC  Recent Labs Lab 03/01/17 0606 03/02/17 0741 03/03/17 0445  WBC 26.5* 23.9* 19.7*  HGB 10.5* 8.8* 8.5*  HCT 30.3* 25.7* 24.6*  PLT 266 225 191    Coag's  Recent Labs Lab 02/26/17 1237  APTT 34  INR 1.34    Sepsis Markers  Recent Labs Lab 02/28/17 0809 02/28/17 1321  03/01/17 1241  03/03/17 0445  03/04/17 0500 03/05/17 0404  LATICACIDVEN 2.6* 2.0*  --  0.6  --   --   --   --   PROCALCITON  --   --   < >  --   < > 0.69 0.42 0.19  < > = values in this interval not displayed.  ABG  Recent Labs Lab 02/28/17 0604 03/03/17 0810 03/04/17 1750  PHART 7.44 7.45 7.41  PCO2ART 24* 26* 42  PO2ART 83 81* 87    Liver Enzymes  Recent Labs Lab 02/26/17 1620 03/05/17 0404  AST 573* 110*  ALT 416* 169*  ALKPHOS  --  117  BILITOT  --  0.6  ALBUMIN  --  2.3*    Cardiac Enzymes No results for  input(s): TROPONINI, PROBNP in the last 168 hours.  Glucose  Recent Labs Lab 03/04/17 0759 03/04/17 1527 03/04/17 1942 03/04/17 2337 03/05/17 0342 03/05/17 0716  GLUCAP 102* 91 93 89 90 98    ASSESSMENT / PLAN: 56 year old white female admitted to the ICU for acute cardiac arrest CPR for approximately 20 minutes status post hypothermia protocol with 1 failed extubation and is now on full vent support and has failed weaning trials over the last several days   PULMONARY A: Acute hypoxemic respiratory failure after cardiac arrest Post reintubation subcutaneous air and possible pneumopericardium without pneumothorax 08/25-resolved P: Full vent support for now Daily SBT if/when meets crtieria   Maintain O2 sats >92% Repeat CXR today  Continue VAP bundle   CARDIOVASCULAR A:  Cardiac Arrest-initial rhythm VF Acute systolic CHF  Bradycardia-resolved Cardiology following appreciate input  Prn levophed and epinephrine drips to maintain map >65 mmHg Added stress dose steroids 08/26 Milrinone initiated 08/23 Prn Atropine for hr <40 Lasix as tolerated  RENAL A:   AKI-resolved  P:   Monitor BMET intermittently Monitor I/Os Correct electrolytes as indicated  GASTROINTESTINAL A:   Recent diarrheal illness P:   SUP: IV famotidine Continue TF protocol   HEMATOLOGIC A:   Anemia without acute blood loss  P:  DVT px: SQ heparin Monitor CBC intermittently Transfuse for hgb <7  INFECTIOUS Staph pneumonia On IV abx  ENDOCRINE A:   Stress induced hyperglycemia  P:   Continue SSI and lantus CBG's q4hrs  NEUROLOGIC A: Mechanical Intubation Discomfort  P: Maintain RASS goal 0 to -1 Discontinued Precedex gtt and started Propofol gtt to maintain RASS goal  Prn Dilaudid for pain management WUA ongoing Promote family presence at bedside   Updates: Spoke with pts husband  regarding plan of care and pt condition all questions answered.   Critical Care Time  devoted to patient care services described in this note is 48 minutes.   Overall, patient is critically ill, prognosis is guarded.  Patient with Multiorgan failure and at high risk for cardiac arrest and death.    Lucie Leather, M.D.  [facility]  Medical Director [facility] Medical Director [facility] Cardio-Pulmonary Department

## 2017-03-06 ENCOUNTER — Inpatient Hospital Stay: Payer: BC Managed Care – PPO

## 2017-03-06 LAB — BLOOD GAS, ARTERIAL
Acid-Base Excess: 4 mmol/L — ABNORMAL HIGH (ref 0.0–2.0)
Bicarbonate: 27.6 mmol/L (ref 20.0–28.0)
FIO2: 0.4
O2 Saturation: 98.8 %
PEEP: 5 cmH2O
Patient temperature: 37
Pressure support: 30 cmH2O
pCO2 arterial: 37 mmHg (ref 32.0–48.0)
pH, Arterial: 7.48 — ABNORMAL HIGH (ref 7.350–7.450)
pO2, Arterial: 114 mmHg — ABNORMAL HIGH (ref 83.0–108.0)

## 2017-03-06 LAB — GLUCOSE, CAPILLARY
GLUCOSE-CAPILLARY: 102 mg/dL — AB (ref 65–99)
Glucose-Capillary: 58 mg/dL — ABNORMAL LOW (ref 65–99)
Glucose-Capillary: 145 mg/dL — ABNORMAL HIGH (ref 65–99)
Glucose-Capillary: 72 mg/dL (ref 65–99)

## 2017-03-06 LAB — BASIC METABOLIC PANEL
Anion gap: 6 (ref 5–15)
BUN: 27 mg/dL — ABNORMAL HIGH (ref 6–20)
CHLORIDE: 115 mmol/L — AB (ref 101–111)
CO2: 26 mmol/L (ref 22–32)
CREATININE: 0.6 mg/dL (ref 0.44–1.00)
Calcium: 7.5 mg/dL — ABNORMAL LOW (ref 8.9–10.3)
GFR calc non Af Amer: >60 mL/min (ref >60)
Glucose, Bld: 79 mg/dL (ref 65–99)
POTASSIUM: 2.6 mmol/L — AB (ref 3.5–5.1)
SODIUM: 147 mmol/L — AB (ref 135–145)

## 2017-03-06 LAB — PHOSPHORUS: Phosphorus: 3 mg/dL (ref 2.5–4.6)

## 2017-03-06 LAB — TRIGLYCERIDES: Triglycerides: 329 mg/dL — ABNORMAL HIGH (ref <150)

## 2017-03-06 LAB — POTASSIUM: Potassium: 4.6 mmol/L (ref 3.5–5.1)

## 2017-03-06 LAB — MAGNESIUM: MAGNESIUM: 1.9 mg/dL (ref 1.7–2.4)

## 2017-03-06 MED ORDER — DEXTROSE 50 % IV SOLN
INTRAVENOUS | Status: AC
  Filled 2017-03-06: qty 50

## 2017-03-06 MED ORDER — QUETIAPINE FUMARATE 25 MG PO TABS
25.0000 mg | ORAL_TABLET | Freq: Every day | ORAL | Status: DC
  Administered 2017-03-06: 25 mg via ORAL
  Filled 2017-03-06: qty 1

## 2017-03-06 MED ORDER — FUROSEMIDE 10 MG/ML IJ SOLN
60.0000 mg | Freq: Once | INTRAMUSCULAR | Status: AC
  Administered 2017-03-06: 60 mg via INTRAVENOUS
  Filled 2017-03-06: qty 6

## 2017-03-06 MED ORDER — POTASSIUM CHLORIDE 10 MEQ/100ML IV SOLN
10.0000 meq | INTRAVENOUS | Status: AC
Start: 2017-03-06 — End: 2017-03-06
  Administered 2017-03-06 (×6): 10 meq via INTRAVENOUS
  Filled 2017-03-06 (×6): qty 100

## 2017-03-06 MED ORDER — MAGNESIUM SULFATE 2 GM/50ML IV SOLN
2.0000 g | Freq: Once | INTRAVENOUS | Status: AC
  Administered 2017-03-06: 2 g via INTRAVENOUS
  Filled 2017-03-06: qty 50

## 2017-03-06 MED ORDER — QUETIAPINE FUMARATE 25 MG PO TABS
25.0000 mg | ORAL_TABLET | Freq: Once | ORAL | Status: AC
  Administered 2017-03-06: 25 mg via ORAL
  Filled 2017-03-06: qty 1

## 2017-03-06 MED ORDER — DEXMEDETOMIDINE HCL IN NACL 400 MCG/100ML IV SOLN
0.4000 ug/kg/h | INTRAVENOUS | Status: DC
  Administered 2017-03-06: 0.1 ug/kg/h via INTRAVENOUS
  Administered 2017-03-06: 0.5 ug/kg/h via INTRAVENOUS
  Administered 2017-03-07: 1.2 ug/kg/h via INTRAVENOUS
  Administered 2017-03-07: 0.6 ug/kg/h via INTRAVENOUS
  Administered 2017-03-07: 0.7 ug/kg/h via INTRAVENOUS
  Administered 2017-03-07: 0.4 ug/kg/h via INTRAVENOUS
  Administered 2017-03-08: 0.3 ug/kg/h via INTRAVENOUS
  Administered 2017-03-09: 0.4 ug/kg/h via INTRAVENOUS
  Administered 2017-03-09: 0.3 ug/kg/h via INTRAVENOUS
  Administered 2017-03-10: 0.4 ug/kg/h via INTRAVENOUS
  Filled 2017-03-06 (×12): qty 100

## 2017-03-06 MED ORDER — BUDESONIDE 0.5 MG/2ML IN SUSP
0.5000 mg | Freq: Two times a day (BID) | RESPIRATORY_TRACT | Status: DC
  Administered 2017-03-06 – 2017-03-11 (×10): 0.5 mg via RESPIRATORY_TRACT
  Filled 2017-03-06 (×10): qty 2

## 2017-03-06 MED ORDER — IPRATROPIUM-ALBUTEROL 0.5-2.5 (3) MG/3ML IN SOLN
3.0000 mL | Freq: Four times a day (QID) | RESPIRATORY_TRACT | Status: DC
  Administered 2017-03-06 – 2017-03-14 (×33): 3 mL via RESPIRATORY_TRACT
  Filled 2017-03-06 (×34): qty 3

## 2017-03-06 MED ORDER — FAMOTIDINE 20 MG PO TABS
20.0000 mg | ORAL_TABLET | Freq: Two times a day (BID) | ORAL | Status: DC
Start: 2017-03-06 — End: 2017-03-11
  Administered 2017-03-06 – 2017-03-10 (×10): 20 mg
  Filled 2017-03-06 (×10): qty 1

## 2017-03-06 MED ORDER — DEXTROSE 50 % IV SOLN
50.0000 mL | Freq: Once | INTRAVENOUS | Status: AC
  Administered 2017-03-06: 50 mL via INTRAVENOUS

## 2017-03-06 MED ORDER — POTASSIUM CHLORIDE 20 MEQ PO PACK
40.0000 meq | PACK | Freq: Once | ORAL | Status: AC
  Administered 2017-03-06: 40 meq via ORAL
  Filled 2017-03-06: qty 2

## 2017-03-06 NOTE — Progress Notes (Signed)
   03/06/17 2217  Vitals  Pulse Rate (!) 120  Pulse Rate Source Monitor  ECG Heart Rate (!) 120  Cardiac Rhythm ST   Increased precedex gtt to 1.2 due to RASS, and RR 50s.

## 2017-03-06 NOTE — Progress Notes (Signed)
MEDICATION RELATED CONSULT NOTE  Pharmacy Consult for Electrolyte Monitoring Indication: Hypomagnesemia  Allergies  Allergen Reactions  . Ace Inhibitors   . Compazine [Prochlorperazine Edisylate]   . Lisinopril   . Penicillins     Has patient had a PCN reaction causing immediate rash, facial/tongue/throat swelling, SOB or lightheadedness with hypotension: Unknown Has patient had a PCN reaction causing severe rash involving mucus membranes or skin necrosis: Unknown Has patient had a PCN reaction that required hospitalization: Unknown Has patient had a PCN reaction occurring within the last 10 years: Unknown If all of the above answers are "NO", then may proceed with Cephalosporin use.     Patient Measurements: Height: 5\' 2"  (157.5 cm) Weight: 203 lb 11.3 oz (92.4 kg) IBW/kg (Calculated) : 50.1  Vital Signs: Temp: 99.8 F (37.7 C) (08/30 1200) Temp Source: Axillary (08/30 1200) BP: 164/91 (08/30 1500) Pulse Rate: 89 (08/30 1500) Intake/Output from previous day: 08/29 0701 - 08/30 0700 In: 364.8 [I.V.:244.8; NG/GT:20; IV Piggyback:100] Out: 2430 [Urine:2130; Stool:300] Intake/Output from this shift: Total I/O In: 35.6 [I.V.:35.6] Out: -   Labs:  Recent Labs  03/04/17 0500 03/05/17 0404 03/06/17 0435  CREATININE 0.67 0.54 0.60  MG 2.6*  --  1.9  PHOS 4.5  --  3.0  ALBUMIN  --  2.3*  --   PROT  --  6.6  --   AST  --  110*  --   ALT  --  169*  --   ALKPHOS  --  117  --   BILITOT  --  0.6  --   BILIDIR  --  0.2  --   IBILI  --  0.4  --    Estimated Creatinine Clearance: 81.1 mL/min (by C-G formula based on SCr of 0.6 mg/dL).  Sodium (mmol/L)  Date Value  03/06/2017 147 (H)  08/16/2016 141   Potassium (mmol/L)  Date Value  03/06/2017 2.6 (LL)   Calcium (mg/dL)  Date Value  53/96/7289 7.5 (L)   Albumin (g/dL)  Date Value  79/15/0413 2.3 (L)    Assessment: 56 y/o F s/p cardiac arrest and hypothermia protocol with hypomagnesemia. Due to high risk of  recurrent arrhythmia, cardiology wants a goal K of > or = 4 and Mg > or = 2.  Plan:  Patient ordered potassium IV Q1hr x 6 doses by NP overnight. Will order additional potassium VT x 1 and magnesium 2g IV x 1. Patient ordered furosemide 60mg  IV x 1 this am. Will recheck potassium at 1800.   Pharmacy will continue to monitor and adjust per consult.   Simpson,Michael L 03/06/2017,3:18 PM

## 2017-03-06 NOTE — Progress Notes (Signed)
Nutrition Follow-up  DOCUMENTATION CODES:   Obesity unspecified  INTERVENTION:  If patient is not extubated and patient will continue tube feeds, plan is for abdominal x-ray to confirm OGT placement prior to re-starting tube feeds.  Continue Vital High Protein at 20 ml/hr (480 ml goal daily volume) + Pro-Stat 60 ml BID via OGT. Goal regimen provides 880 kcal, 102 grams of protein, 403 ml H2O daily. With current propofol rate provides 1601 kcal daily. Continue PEPuP (can use PEPuP calculator in sidebar).  As patient is undergoing gentle diuresis, recommend providing minimal free water flushes on pump setting of 30 ml Q8hrs to prevent clogging of OGT.  Continue liquid multivitamin with minerals daily as goal TF regimen does not meet 100% RDIs.  NUTRITION DIAGNOSIS:   Inadequate oral intake related to inability to eat (pt ventilated and sedated) as evidenced by NPO status.  Ongoing - addressing with TF.  GOAL:   Provide needs based on ASPEN/SCCM guidelines  Not met in past 24 hrs as TFs were held.  MONITOR:   Vent status, Labs, Weight trends, TF tolerance, I & O's  REASON FOR ASSESSMENT:   Ventilator    ASSESSMENT:   56 year old female with PMHx of HTN, migraines, edema to bilateral legs. Patient had diarrhea for 2-3 days PTA and was taking up to 12 imodium per day, staying well-hydrated, and stopped taking antihypertensives. She suffered V-fib arrest at home, was intubated in the field after ROSC.  -Rectal tube placed on 8/28. -Patient failed weaning trials on 8/27 and 8/28. -On 8/29 ETT was pulled back 2 cm. -Patient's husband discussed with MD that patient would not want trach or PEG tube. Patient has been changed to DNR status.  Access: OGT placed 8/25; will need to confirm placement prior to using again  TF: patient had previously been tolerating Vital High Protein at 20 ml/hr; per history on pump patient has received 0 ml of tube feed in past 24 hrs (goal volume of  480 ml daily). Patient received 60 ml of Pro-Stat yesterday morning, but evening Pro-Stat was held. Patient has only received 200 kcal (20% minimum estimated kcal needs) and 30 grams of protein (30% minimum estimated protein needs) in the past 24 hrs.  Patient is currently intubated on ventilator support MV: 17.3 L/min Temp (24hrs), Avg:99.9 F (37.7 C), Min:98.1 F (36.7 C), Max:101.3 F (38.5 C)  Propofol: on hold for WUA; previously 27.3 ml/hr (721 kcal daily)  Medications reviewed and include: liquid MVI daily, ceftriaxone, Precedex gtt, famotidine, potassium chloride 10 mEq 6 times IV today, propofol gtt.  Labs reviewed: CBG 58-145 past 24 hrs, Sodium 147, Potassium 2.6, Chloride 115.  I/O 0700 8/29 to 0700 8/30: 2130 ml UOP (1 ml/kg/hr - adequate); 300 ml output in rectal tube  Weight trend: 92.4 kg on 8/30 (+3.7 kg from admission)  Discussed with RN. TFs were held yesterday for WUA/SBT and were likely not restarted. On hold currently for WUA. If TFs are restarted today will need to confirm placement of OGT as it got pulled back some when ETT tube pulled back.  Diet Order:     Skin:  Reviewed, no issues  Last BM:  03/06/2017 - small type 7 per rectal tube  Height:   Ht Readings from Last 1 Encounters:  03/05/17 _0  (1.575 m)    Weight:   Wt Readings from Last 1 Encounters:  03/06/17 203 lb 11.3 oz (92.4 kg)    Ideal Body Weight:  50 kg  BMI:  Body mass index is 37.26 kg/m.  Estimated Nutritional Needs:   Kcal:  758-8325 (11-14 kcal/kg)  Protein:  >/= 100 grams (>/= 2 grams/kg IBW)  Fluid:  1.5-1.75 L/day (30-35 ml/kg IBW)  EDUCATION NEEDS:   No education needs identified at this time  Willey Blade, MS, RD, LDN Pager: [phone] After Hours Pager: [phone]

## 2017-03-06 NOTE — Progress Notes (Signed)
   03/06/17 2126  Vitals  Pulse Rate (!) 49  ECG Heart Rate (!) 47  Resp (!) 23  Oxygen Therapy  SpO2 94 %   Precedex dose dcecreased to 0.5 from 0.8 due to bradycardia.  Continue to monitor.

## 2017-03-06 NOTE — Progress Notes (Signed)
[facility] - [place] at [facility]   PATIENT NAME: Miranda [name]    MR#:  [id number]  DATE OF BIRTH:  1960/12/30  SUBJECTIVE:   Intubated,sedated and on the vent . Somewhat agitated , tapered sedation, low dose precedex now.  REVIEW OF SYSTEMS:   Review of Systems  Unable to perform ROS: Intubated   DRUG ALLERGIES:   Allergies  Allergen Reactions  . Ace Inhibitors   . Compazine [Prochlorperazine Edisylate]   . Lisinopril   . Penicillins     Has patient had a PCN reaction causing immediate rash, facial/tongue/throat swelling, SOB or lightheadedness with hypotension: Unknown Has patient had a PCN reaction causing severe rash involving mucus membranes or skin necrosis: Unknown Has patient had a PCN reaction that required hospitalization: Unknown Has patient had a PCN reaction occurring within the last 10 years: Unknown If all of the above answers are "NO", then may proceed with Cephalosporin use.     VITALS:  Blood pressure (!) 164/91, pulse 89, temperature 99.8 F (37.7 C), temperature source Axillary, resp. rate (!) 43, height 5\' 2"  (1.575 m), weight 92.4 kg (203 lb 11.3 oz), SpO2 96 %.  PHYSICAL EXAMINATION:   Physical Exam  GENERAL:  56 y.o.-year-old patient lying in the bed with no acute distress. Critically ill EYES: Pupils equal, round, reactive to light . No scleral icterus. Extraocular muscles intact.  HEENT: Head atraumatic, normocephalic. Oropharynx and nasopharynx clear. Intubated NECK:  Supple, no jugular venous distention. No thyroid enlargement, no tenderness, NG tube.  LUNGS: Normal breath sounds bilaterally, no wheezing, rales, rhonchi. No use of accessory muscles of respiration.  CARDIOVASCULAR: S1, S2 normal. No murmurs, rubs, or gallops.  ABDOMEN: Soft, nontender, nondistended. Bowel sounds feeble. No organomegaly or mass.  EXTREMITIES: No cyanosis, clubbing or edema b/l.    NEUROLOGIC: Intubated on the ventilator   PSYCHIATRIC:  Sedated and intubated on the ventilator  SKIN: No obvious rash, lesion, or ulcer.   LABORATORY PANEL:  CBC  Recent Labs Lab 03/03/17 0445  WBC 19.7*  HGB 8.5*  HCT 24.6*  PLT 191    Chemistries   Recent Labs Lab 03/05/17 0404 03/06/17 0435  NA 145 147*  K 3.9 2.6*  CL 111 115*  CO2 27 26  GLUCOSE 117* 79  BUN 41* 27*  CREATININE 0.54 0.60  CALCIUM 8.1* 7.5*  MG  --  1.9  AST 110*  --   ALT 169*  --   ALKPHOS 117  --   BILITOT 0.6  --    Cardiac Enzymes No results for input(s): TROPONINI in the last 168 hours. RADIOLOGY:  Dg Chest Port 1 View  Result Date: 03/06/2017 CLINICAL DATA:  Respiratory failure EXAM: PORTABLE CHEST 1 VIEW COMPARISON:  03/05/2017 FINDINGS: Endotracheal tube and NG tube as well as right PICC line remain in place, unchanged. Diffuse bilateral airspace disease, slightly worsened since prior study. Cardiomegaly. IMPRESSION: Worsening diffuse bilateral airspace disease, likely edema/ CHF. Electronically Signed   By: Charlett Nose M.D.   On: 03/06/2017 07:11   Dg Chest Port 1 View  Result Date: 03/05/2017 CLINICAL DATA:  Post cardiac arrest.  Respiratory failure. EXAM: PORTABLE CHEST 1 VIEW COMPARISON:  03/04/2017 FINDINGS: Endotracheal tube terminates 11 mm above the carina. Retraction by approximately 2 cm is recommended. Enteric catheter tip is collimated off the image. Right PICC line in stable position with tip overlying the expected position of superior vena cava. Mildly enlarged cardiomediastinal silhouette. No evidence of pneumothorax. Low lung  volumes with mixed pattern pulmonary edema. Osseous structures are without acute abnormality. Soft tissues are grossly normal. IMPRESSION: Endotracheal tube terminates 11 mm above the carina. Retraction by approximately 2 cm is recommended. Mixed pattern pulmonary edema. Borderline enlarged cardiomediastinal silhouette. This may be due to portable AP technique, however vascular abnormality  cannot be excluded. Please correlate clinically. These results will be called to the ordering clinician or representative by the Radiologist Assistant, and communication documented in the PACS or zVision Dashboard. Electronically Signed   By: Ted Mcalpine M.D.   On: 03/05/2017 12:37   ASSESSMENT AND PLAN:   Ms. Triglia reportedly had diarrhea over the last 2-3 days for which she was taking up to 12 Imodium per day. She was trying to stay well hydrated and had also stopped taking her blood pressure medications on account of her GI illness. Around 9 PM, Ms. Fitchett's husband found the patient seated on the toilet. She attempted to stand up but passed out.  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 Severely reduced LVEF less than <20%  -repeat echo showed EF of 60-65% -Patient was on IV levophed,Milrinone- improved now. - Was IV propofol -Patient was  on hypothermia protocol -CT chest negative for PE -CXR shows pulmonary edema. Received Lasix - Now very low dose sedation,and waiting to control her agitation, then may do extubation.  *Acute renal failure secondary to #1 -making good urine -Creatinine stable  *Febrile illness -Low-grade fever, tachycardia elevated white count of 40,000--- 19K -Blood cultures so far negative -Empiric IV vancomycin and Zosyn- changed to rocephin -C. Difficile negative -chest x-ray shows diffuse pulmonary infiltrate  *altered mental status -Patient currently has periods of agitation  -Seen by Dr. Thad Ranger -- neurology -Recommend EEG and brain MRI. - No acute injuries. Taper sedation slowly. - likely due to sedative meds.  *Metabolic acidosis secondary to #1  *Hyperglycemia -Patient currently off insulin drip  * Nutrition -started on Tube feeding  *DVT prophylaxis subcutaneous heparin  .Discussed with Dr. Belia Heman Patient is  critically ill. Family understands.  Case discussed with Care Management/Social Worker. Management plans discussed with the  family and they are in agreement.  CODE STATUS: Full  DVT Prophylaxis: Subcutaneous heparin  TOTAL CRITICAL TIME TAKING CARE OF THIS PATIENT: 30 minutes.  >50% time spent on counselling and coordination of care Spoke to her husband in room today.  Note: This dictation was prepared with Dragon dictation along with smaller phrase technology. Any transcriptional errors that result from this process are unintentional.  Altamese Dilling M.D on 03/06/2017 at 3:50 PM  Between 7am to 6pm - Pager - [phone]  After 6pm go to [email or web address] - Social research officer, government  [facility]  Office  [phone]  CC: Primary care physician; Merwyn Katos, MD

## 2017-03-06 NOTE — Progress Notes (Signed)
PULMONARY / CRITICAL CARE MEDICINE   Name: Miranda [name] MRN: [id number] DOB: [birth year]    ADMISSION DATE:  02/25/2017   PT PROFILE: 40 F suffered OOH arrest with at least 20 mins ACLS/CPR. Initial identified rhythm was VF. Underwent defib X 3 before ROSC. LHC revealed no CAD, severely reduced LV function and moderately elevated LVEDP. TTM 33 degree protocol initiated.  MAJOR EVENTS/TEST RESULTS: 08/21 CT head: no acute findings 08/21 CTA chest: no PE. Diffuse bilateral opacities 08/22 LHC: no CAD, severely reduced LV function and moderately elevated LVEDP 08/22 Echocardiogram:  08/22 Refractory shock despite norepinephrine and phenylephrine infusions. Epinephrine infusion initiated 08/23 Rewarming initiated. Sinus bradyarrhythmias. Amiodarone DC'd. Milrinone initiated. Improved gas exchange. Off of phenylephrine and norepi. Weaning of of epinephrine 08/25 Pt failed extubation requiring emergent reintubation within minutes, difficult reintubation  08/27 failed weaning trials 08/28 failed wean trials 08/29 MRI no acute process EEG slow process Appreciate neurology consult   INDWELLING DEVICES:: ETT 08/21 >>  L IJ CVL 08/22 >>  R femoral A-line 08/22 >>   MICRO DATA: MRSA PCR 08/22 >>negative  Blood 08/22 >>negative  Urine 08/24>>negative Respiratory 08/24>>staph aureus   ANTIMICROBIALS:  Zosyn 08/24>> 08/28 Vancomycin 08/24>> 08/28  SUBJECTIVE:  Patient remains critically ill On full vent support Lasix as tolerated FiO2 down to 45% Patient failed weaning trials yesterday we will try again today Will stop propofol-  VITAL SIGNS: BP (!) 150/65 (BP Location: Left Arm)   Pulse (!) 51   Temp 100.1 F (37.8 C) (Oral)   Resp (!) 29   Ht 5\' 2"  (1.575 m)   Wt 203 lb 11.3 oz (92.4 kg)   SpO2 95%   BMI 37.26 kg/m   HEMODYNAMICS:    VENTILATOR SETTINGS: Vent Mode: PCV FiO2 (%):  [40 %-60 %] 40 % Set Rate:  [16 bmp] 16 bmp Vt Set:  [420 mL] 420 mL PEEP:  [5 cmH20]  5 cmH20 Pressure Support:  [30 cmH20] 30 cmH20  INTAKE / OUTPUT: I/O last 3 completed shifts: In: 1075.7 [I.V.:545.7; NG/GT:380; IV Piggyback:150] Out: 3755 [Urine:3205; Stool:550]  PHYSICAL EXAMINATION: General: acutely ill appearing Caucasian female, intubated NAD  Neuro: sedated, withdraws from painful stimulation, PERRL Cardiovascular: irreg, bradycardia with junctional escape beats and PACs, no M/R/G Lungs: rhonchi and crackles throughout, even, non labored; no wheezes or rales  Abdomen: hypoactive BS x4, soft, obese, non distended  Ext: 2+ generalized edema, moves all extremities   LABS:  BMET  Recent Labs Lab 03/04/17 0500 03/05/17 0404 03/06/17 0435  NA 137 145 147*  K 4.5 3.9 2.6*  CL 105 111 115*  CO2 25 27 26   BUN 36* 41* 27*  CREATININE 0.67 0.54 0.60  GLUCOSE 138* 117* 79    Electrolytes  Recent Labs Lab 03/01/17 0606  03/03/17 0445 03/04/17 0500 03/05/17 0404 03/06/17 0435  CALCIUM 8.3*  < > 8.1* 7.6* 8.1* 7.5*  MG 2.0  < > 1.9 2.6*  --  1.9  PHOS 2.5  --   --  4.5  --  3.0  < > = values in this interval not displayed.  CBC  Recent Labs Lab 03/01/17 0606 03/02/17 0741 03/03/17 0445  WBC 26.5* 23.9* 19.7*  HGB 10.5* 8.8* 8.5*  HCT 30.3* 25.7* 24.6*  PLT 266 225 191    Coag's No results for input(s): APTT, INR in the last 168 hours.  Sepsis Markers  Recent Labs Lab 02/28/17 0809 02/28/17 1321  03/01/17 1241  03/03/17 0445 03/04/17 0500 03/05/17 0404  LATICACIDVEN 2.6* 2.0*  --  0.6  --   --   --   --   PROCALCITON  --   --   < >  --   < > 0.69 0.42 0.19  < > = values in this interval not displayed.  ABG  Recent Labs Lab 02/28/17 0604 03/03/17 0810 03/04/17 1750  PHART 7.44 7.45 7.41  PCO2ART 24* 26* 42  PO2ART 83 81* 87    Liver Enzymes  Recent Labs Lab 03/05/17 0404  AST 110*  ALT 169*  ALKPHOS 117  BILITOT 0.6  ALBUMIN 2.3*    Cardiac Enzymes No results for input(s): TROPONINI, PROBNP in the last 168  hours.  Glucose  Recent Labs Lab 03/05/17 0342 03/05/17 0716 03/05/17 1141 03/05/17 1635 03/06/17 0710 03/06/17 0745  GLUCAP 90 98 80 87 58* 145*     ASSESSMENT / PLAN: 56 year old white female admitted to the ICU for acute cardiac arrest CPR for approximately 20 minutes status post hypothermia protocol with 1 failed extubation and is now on full vent support and has failed weaning trials over the last several days   PULMONARY A: Acute hypoxemic respiratory failure after cardiac arrest Post reintubation subcutaneous air and possible pneumopericardium without pneumothorax 08/25-resolved P: Full vent support for now Daily SBT if/when meets crtieria   Maintain O2 sats >92% Repeat CXR today  Continue VAP bundle   CARDIOVASCULAR A:  Cardiac Arrest-initial rhythm VF Acute systolic CHF  Bradycardia-resolved Cardiology following appreciate input  Prn levophed and epinephrine drips to maintain map >65 mmHg Added stress dose steroids 08/26 Milrinone initiated 08/23 Prn Atropine for hr <40 Lasix as tolerated  RENAL A:   AKI-resolved  P:   Monitor BMET intermittently Monitor I/Os Correct electrolytes as indicated  GASTROINTESTINAL A:   Recent diarrheal illness P:   SUP: IV famotidine Continue TF protocol   HEMATOLOGIC A:   Anemia without acute blood loss  P:  DVT px: SQ heparin Monitor CBC intermittently Transfuse for hgb <7  INFECTIOUS Staph pneumonia On IV abx  ENDOCRINE A:   Stress induced hyperglycemia  P:   Continue SSI and lantus CBG's q4hrs  NEUROLOGIC A: Mechanical Intubation Discomfort  P: Maintain RASS goal 0 to -1 Discontinued Precedex gtt Propofol gtt only, avoid narcotics WUA ongoing Promote family presence at bedside     Critical Care Time devoted to patient care services described in this note is 38 minutes.   Overall, patient is critically ill, prognosis is guarded.  Patient with Multiorgan failure and at high risk for  cardiac arrest and death.    Lucie Leather, M.D.  [facility]  Medical Director [facility] Medical Director [facility] Cardio-Pulmonary Department

## 2017-03-06 NOTE — Progress Notes (Signed)
Pt's PC was increased from 20 to 25 per [name] due to her RR increaing to the low 40's.  Pt's RR has decreased to 28 bpm with this change.

## 2017-03-06 NOTE — Progress Notes (Signed)
   03/06/17 2158  Vitals  Pulse Rate 90  ECG Heart Rate 91  Resp (!) 23  Oxygen Therapy  SpO2 97 %  End [place] CO2 (EtCO2) 48   RASS 3.  Increased gtt form 0.4 to 0.6.   Continue to monitor.

## 2017-03-06 NOTE — Progress Notes (Signed)
Subjective: Patient unable to be weaned.  Remains intubated with some sedation.  Despite this improved from yesterday.    Objective: Current vital signs: BP (!) 80/61   Pulse (!) 52   Temp 100.2 F (37.9 C) (Axillary)   Resp 13   Ht _0  (1.575 m)   Wt 92.4 kg (203 lb 11.3 oz)   SpO2 95%   BMI 37.26 kg/m  Vital signs in last 24 hours: Temp:  [98.1 F (36.7 C)-101.3 F (38.5 C)] 100.2 F (37.9 C) (08/30 0800) Pulse Rate:  [51-92] 52 (08/30 1200) Resp:  [13-44] 13 (08/30 1200) BP: (80-174)/(54-104) 80/61 (08/30 1200) SpO2:  [90 %-98 %] 95 % (08/30 1200) FiO2 (%):  [40 %] 40 % (08/30 0805) Weight:  [92.4 kg (203 lb 11.3 oz)] 92.4 kg (203 lb 11.3 oz) (08/30 0430)  Intake/Output from previous day: 08/29 0701 - 08/30 0700 In: 364.8 [I.V.:244.8; NG/GT:20; IV Piggyback:100] Out: 2430 [Urine:2130; Stool:300] Intake/Output this shift: No intake/output data recorded. Nutritional status:    Neurologic Exam: Mental Status: Eyes open.  No speech, Does not follow commands.  Agitated. Localizes to pain.   Cranial Nerves: II: Discs flat bilaterally; Does not fix or blink to bilateral confrontation III,IV, VI: ptosis not present, extra-ocular motions intact bilaterally V,VII: corneals intact bilaterally VIII: unable to test IX,X: unable to test XI: unable to test XII: unable to test Motor: Moves all extremities spontaneously and localizes to pain with both upper extremities Sensory: Responds to noxious stimuli throughout Deep Tendon Reflexes: 2+ and symmetric throughout Plantars: Right: upgoing   Left: upgoing   Lab Results: Basic Metabolic Panel:  Recent Labs Lab 03/01/17 0606 03/02/17 0741 03/02/17 2059 03/03/17 0445 03/04/17 0500 03/05/17 0404 03/06/17 0435  NA 135 134* 138 136 137 145 147*  K 4.8 4.6 4.3 4.1 4.5 3.9 2.6*  CL 108 105 109 108 105 111 115*  CO2 20* _1 GLUCOSE 120* 149* 100* 149* 138* 117* 79  BUN 16 16 27* 30* 36* 41* 27*   CREATININE 0.84 0.69 0.76 0.72 0.67 0.54 0.60  CALCIUM 8.3* 8.3* 8.2* 8.1* 7.6* 8.1* 7.5*  MG 2.0 2.0 1.9 1.9 2.6*  --  1.9  PHOS 2.5  --   --   --  4.5  --  3.0    Liver Function Tests:  Recent Labs Lab 03/05/17 0404  AST 110*  ALT 169*  ALKPHOS 117  BILITOT 0.6  PROT 6.6  ALBUMIN 2.3*   No results for input(s): LIPASE, AMYLASE in the last 168 hours.  Recent Labs Lab 03/05/17 0404  AMMONIA 44*    CBC:  Recent Labs Lab 02/28/17 0431 03/01/17 0606 03/02/17 0741 03/03/17 0445  WBC 47.4* 26.5* 23.9* 19.7*  NEUTROABS  --  25.0* 22.4* 17.9*  HGB 11.0* 10.5* 8.8* 8.5*  HCT 31.6* 30.3* 25.7* 24.6*  MCV 87.3 89.4 88.4 89.8  PLT 376 266 225 191    Cardiac Enzymes: No results for input(s): CKTOTAL, CKMB, CKMBINDEX, TROPONINI in the last 168 hours.  Lipid Panel:  Recent Labs Lab 03/02/17 2059 03/06/17 0435  TRIG 101 329*    CBG:  Recent Labs Lab 03/05/17 1141 03/05/17 1635 03/06/17 0710 03/06/17 0745 03/06/17 1106  GLUCAP 80 87 58* 145* 29    Microbiology: Results for orders placed or performed during the hospital encounter of 02/25/17  MRSA PCR Screening     Status: None   Collection Time: 02/26/17  1:36 AM  Result Value Ref  Range Status   MRSA by PCR NEGATIVE NEGATIVE Final    Comment:        The GeneXpert MRSA Assay (FDA approved for NASAL specimens only), is one component of a comprehensive MRSA colonization surveillance program. It is not intended to diagnose MRSA infection nor to guide or monitor treatment for MRSA infections.   Blood culture (routine x 2)     Status: None   Collection Time: 02/26/17  2:08 AM  Result Value Ref Range Status   Specimen Description BLOOD LEFT WRIST  Final   Special Requests   Final    BOTTLES DRAWN AEROBIC AND ANAEROBIC Blood Culture adequate volume   Culture NO GROWTH 5 DAYS  Final   Report Status 03/03/2017 FINAL  Final  Blood culture (routine x 2)     Status: None   Collection Time: 02/26/17   3:40 AM  Result Value Ref Range Status   Specimen Description BLOOD PORT  Final   Special Requests   Final    BOTTLES DRAWN AEROBIC AND ANAEROBIC Blood Culture adequate volume   Culture NO GROWTH 5 DAYS  Final   Report Status 03/03/2017 FINAL  Final  Urine Culture     Status: None   Collection Time: 02/28/17 10:18 AM  Result Value Ref Range Status   Specimen Description URINE, CATHETERIZED  Final   Special Requests Normal  Final   Culture   Final    NO GROWTH Performed at [facility], [place] [address] [place], [place] [address]    Report Status 03/01/2017 FINAL  Final  Culture, respiratory (NON-Expectorated)     Status: None   Collection Time: 02/28/17 10:45 AM  Result Value Ref Range Status   Specimen Description TRACHEAL ASPIRATE  Final   Special Requests NONE  Final   Gram Stain   Final    ABUNDANT WBC PRESENT,BOTH PMN AND MONONUCLEAR FEW SQUAMOUS EPITHELIAL CELLS PRESENT MODERATE GRAM POSITIVE COCCI FEW GRAM NEGATIVE RODS RARE GRAM POSITIVE RODS RARE GRAM NEGATIVE COCCI Performed at [facility], [place] [address], [place], [place] [address]    Culture ABUNDANT STAPHYLOCOCCUS AUREUS  Final   Report Status 03/02/2017 FINAL  Final   Organism ID, Bacteria STAPHYLOCOCCUS AUREUS  Final      Susceptibility   Staphylococcus aureus - MIC*    CIPROFLOXACIN <=0.5 SENSITIVE Sensitive     ERYTHROMYCIN <=0.25 SENSITIVE Sensitive     GENTAMICIN <=0.5 SENSITIVE Sensitive     OXACILLIN <=0.25 SENSITIVE Sensitive     TETRACYCLINE <=1 SENSITIVE Sensitive     VANCOMYCIN <=0.5 SENSITIVE Sensitive     TRIMETH/SULFA <=10 SENSITIVE Sensitive     CLINDAMYCIN <=0.25 SENSITIVE Sensitive     RIFAMPIN <=0.5 SENSITIVE Sensitive     Inducible Clindamycin NEGATIVE Sensitive     * ABUNDANT STAPHYLOCOCCUS AUREUS  C difficile quick scan w PCR reflex     Status: None   Collection Time: 03/04/17 11:40 AM  Result Value Ref Range Status   C Diff antigen NEGATIVE NEGATIVE Final   C Diff  toxin NEGATIVE NEGATIVE Final   C Diff interpretation No C. difficile detected.  Final    Coagulation Studies: No results for input(s): LABPROT, INR in the last 72 hours.  Imaging: Mr Brain Wo Contrast  Result Date: 03/04/2017 CLINICAL DATA:  Encephalopathy. Recent diarrhea and cardiac arrest. Not following commands. EXAM: MRI HEAD WITHOUT CONTRAST TECHNIQUE: Multiplanar, multiecho pulse sequences of the brain and surrounding structures were obtained without intravenous contrast. COMPARISON:  Head CT from 7 days ago FINDINGS: Brain: No acute infarction, hemorrhage, hydrocephalus, extra-axial collection or mass lesion. No evidence of brain swelling. Few FLAIR hyperintensities in the cerebral white matter, often attributed to chronic microvascular ischemia. No specific demyelinating pattern. Negative for brain atrophy. Vascular: Major flow voids are preserved. Skull and upper cervical spine: Negative for marrow lesion Sinuses/Orbits: Partial bilateral mastoid opacification in the setting of nasopharyngeal and oropharyngeal fluid levels in this intubated patient. IMPRESSION: No acute finding including evidence of anoxic injury. Electronically Signed   By: Monte Fantasia M.D.   On: 03/04/2017 15:40   Dg Chest Port 1 View  Result Date: 03/06/2017 CLINICAL DATA:  Respiratory failure EXAM: PORTABLE CHEST 1 VIEW COMPARISON:  03/05/2017 FINDINGS: Endotracheal tube and NG tube as well as right PICC line remain in place, unchanged. Diffuse bilateral airspace disease, slightly worsened since prior study. Cardiomegaly. IMPRESSION: Worsening diffuse bilateral airspace disease, likely edema/ CHF. Electronically Signed   By: Rolm Baptise M.D.   On: 03/06/2017 07:11   Dg Chest Port 1 View  Result Date: 03/05/2017 CLINICAL DATA:  Post cardiac arrest.  Respiratory failure. EXAM: PORTABLE CHEST 1 VIEW COMPARISON:  03/04/2017 FINDINGS: Endotracheal tube terminates 11 mm above the carina. Retraction by approximately 2  cm is recommended. Enteric catheter tip is collimated off the image. Right PICC line in stable position with tip overlying the expected position of superior vena cava. Mildly enlarged cardiomediastinal silhouette. No evidence of pneumothorax. Low lung volumes with mixed pattern pulmonary edema. Osseous structures are without acute abnormality. Soft tissues are grossly normal. IMPRESSION: Endotracheal tube terminates 11 mm above the carina. Retraction by approximately 2 cm is recommended. Mixed pattern pulmonary edema. Borderline enlarged cardiomediastinal silhouette. This may be due to portable AP technique, however vascular abnormality cannot be excluded. Please correlate clinically. These results will be called to the ordering clinician or representative by the Radiologist Assistant, and communication documented in the PACS or zVision Dashboard. Electronically Signed   By: Fidela Salisbury M.D.   On: 03/05/2017 12:37    Medications:  I have reviewed the patient's current medications. Scheduled: . budesonide (PULMICORT) nebulizer solution  0.5 mg Nebulization BID  . chlorhexidine gluconate (MEDLINE KIT)  15 mL Mouth Rinse BID  . famotidine  20 mg Per Tube BID  . feeding supplement (PRO-STAT SUGAR FREE 64)  60 mL Per Tube BID  . feeding supplement (VITAL HIGH PROTEIN)  1,000 mL Per Tube Q24H  . heparin  5,000 Units Subcutaneous Q8H  . ipratropium-albuterol  3 mL Nebulization Q6H  . mouth rinse  15 mL Mouth Rinse 10 times per day  . multivitamin  15 mL Per Tube Daily  . QUEtiapine  25 mg Oral QHS  . sodium chloride flush  10-40 mL Intracatheter Q12H    Assessment/Plan: Will continue to follow clinically   LOS: 8 days   Alexis Goodell, MD Neurology [phone] 03/06/2017  1:46 PM

## 2017-03-06 NOTE — Progress Notes (Signed)
Notified [name], NP that K is 2.9 this am.  Blake Divine, RN

## 2017-03-06 NOTE — Progress Notes (Signed)
Patient Name: Miranda [name] Date of Encounter: 03/06/2017  Primary Cardiologist: New to [facility] - consult by [facility] Problem List     Principal Problem:   Cardiac arrest with ventricular fibrillation [facility]) Active Problems:   Cardiac arrest ([place])   Acute respiratory failure ([place])   Hypokalemia   Encounter for central line placement   Cardiogenic shock ([place])   Acute pulmonary edema ([facility])   Anoxic encephalopathy ([place])   Bradycardia     Subjective   Remains intubated. She is off sedation this morning and she is agitated. She does not follow commands.  Inpatient Medications    Scheduled Meds: . chlorhexidine gluconate (MEDLINE KIT)  15 mL Mouth Rinse BID  . feeding supplement (PRO-STAT SUGAR FREE 64)  60 mL Per Tube BID  . feeding supplement (VITAL HIGH PROTEIN)  1,000 mL Per Tube Q24H  . heparin  5,000 Units Subcutaneous Q8H  . hydrocortisone sod succinate (SOLU-CORTEF) inj  50 mg Intravenous Q12H  . mouth rinse  15 mL Mouth Rinse 10 times per day  . multivitamin  15 mL Per Tube Daily  . sodium chloride flush  10-40 mL Intracatheter Q12H   Continuous Infusions: . cefTRIAXone (ROCEPHIN)  IV Stopped (03/05/17 1057)  . epinephrine Stopped (02/28/17 1600)  . famotidine (PEPCID) IV Stopped (03/05/17 2202)  . norepinephrine (LEVOPHED) Adult infusion Stopped (03/04/17 0747)  . potassium chloride 10 mEq (03/06/17 2947)  . propofol (DIPRIVAN) infusion 50 mcg/kg/min (03/06/17 0603)   PRN Meds: acetaminophen, atropine, sennosides **AND** docusate, levalbuterol, naLOXone (NARCAN)  injection, pneumococcal 23 valent vaccine, sodium chloride flush, vecuronium   Vital Signs    Vitals:   03/06/17 0530 03/06/17 0600 03/06/17 0630 03/06/17 0700  BP: (!) 156/85 (!) 161/65 (!) 143/56 (!) 150/65  Pulse: 68 67 64 (!) 51  Resp: (!) 35 (!) 25 (!) 37 (!) 29  Temp:      TempSrc:      SpO2: 95% 97% 93% 95%  Weight:      Height:        Intake/Output Summary (Last 24 hours) at  03/06/17 0825 Last data filed at 03/06/17 0400  Gross per 24 hour  Intake           320.17 ml  Output             2390 ml  Net         -2069.83 ml   Filed Weights   03/04/17 0451 03/05/17 0411 03/06/17 0430  Weight: 212 lb 11.9 oz (96.5 kg) 199 lb 11.8 oz (90.6 kg) 203 lb 11.3 oz (92.4 kg)    Physical Exam    GEN: Critically ill appearing, in no acute distress.  HEENT: Grossly normal.  Neck: Supple, no JVD, carotid bruits, or masses. Cardiac: Bradycardic, no murmurs, rubs, or gallops. No clubbing, cyanosis, edema.  Radials/DP/PT 2+ and equal bilaterally.  Respiratory:  Diminished breath sounds bilaterally. Intubated. GI: Soft, nontender, nondistended, BS + x 4. MS: no deformity or atrophy. Skin: warm and dry, no rash. Neuro:  Intubated and sedated. Psych: Intubated and sedatedt.  Labs    CBC No results for input(s): WBC, NEUTROABS, HGB, HCT, MCV, PLT in the last 72 hours. Basic Metabolic Panel  Recent Labs  03/04/17 0500 03/05/17 0404 03/06/17 0435  NA 137 145 147*  K 4.5 3.9 2.6*  CL 105 111 115*  CO2 '25 27 26  ' GLUCOSE 138* 117* 79  BUN 36* 41* 27*  CREATININE 0.67 0.54 0.60  CALCIUM 7.6* 8.1* 7.5*  MG 2.6*  --  1.9  PHOS 4.5  --  3.0   Liver Function Tests  Recent Labs  03/05/17 0404  AST 110*  ALT 169*  ALKPHOS 117  BILITOT 0.6  PROT 6.6  ALBUMIN 2.3*   No results for input(s): LIPASE, AMYLASE in the last 72 hours. Cardiac Enzymes No results for input(s): CKTOTAL, CKMB, CKMBINDEX, TROPONINI in the last 72 hours. BNP Invalid input(s): POCBNP D-Dimer No results for input(s): DDIMER in the last 72 hours. Hemoglobin A1C No results for input(s): HGBA1C in the last 72 hours. Fasting Lipid Panel  Recent Labs  03/06/17 0435  TRIG 329*   Thyroid Function Tests No results for input(s): TSH, T4TOTAL, T3FREE, THYROIDAB in the last 72 hours.  Invalid input(s): FREET3  Telemetry    Sinus bradycardia, 40s bpm - Personally Reviewed  ECG    n/a  - Personally Reviewed  Radiology    Mr Brain Wo Contrast  Result Date: 03/04/2017 CLINICAL DATA:  Encephalopathy. Recent diarrhea and cardiac arrest. Not following commands. EXAM: MRI HEAD WITHOUT CONTRAST TECHNIQUE: Multiplanar, multiecho pulse sequences of the brain and surrounding structures were obtained without intravenous contrast. COMPARISON:  Head CT from 7 days ago FINDINGS: Brain: No acute infarction, hemorrhage, hydrocephalus, extra-axial collection or mass lesion. No evidence of brain swelling. Few FLAIR hyperintensities in the cerebral white matter, often attributed to chronic microvascular ischemia. No specific demyelinating pattern. Negative for brain atrophy. Vascular: Major flow voids are preserved. Skull and upper cervical spine: Negative for marrow lesion Sinuses/Orbits: Partial bilateral mastoid opacification in the setting of nasopharyngeal and oropharyngeal fluid levels in this intubated patient. IMPRESSION: No acute finding including evidence of anoxic injury. Electronically Signed   By: Monte Fantasia M.D.   On: 03/04/2017 15:40   Dg Chest Port 1 View  Result Date: 03/06/2017 CLINICAL DATA:  Respiratory failure EXAM: PORTABLE CHEST 1 VIEW COMPARISON:  03/05/2017 FINDINGS: Endotracheal tube and NG tube as well as right PICC line remain in place, unchanged. Diffuse bilateral airspace disease, slightly worsened since prior study. Cardiomegaly. IMPRESSION: Worsening diffuse bilateral airspace disease, likely edema/ CHF. Electronically Signed   By: Rolm Baptise M.D.   On: 03/06/2017 07:11   Dg Chest Port 1 View  Result Date: 03/05/2017 CLINICAL DATA:  Post cardiac arrest.  Respiratory failure. EXAM: PORTABLE CHEST 1 VIEW COMPARISON:  03/04/2017 FINDINGS: Endotracheal tube terminates 11 mm above the carina. Retraction by approximately 2 cm is recommended. Enteric catheter tip is collimated off the image. Right PICC line in stable position with tip overlying the expected position of  superior vena cava. Mildly enlarged cardiomediastinal silhouette. No evidence of pneumothorax. Low lung volumes with mixed pattern pulmonary edema. Osseous structures are without acute abnormality. Soft tissues are grossly normal. IMPRESSION: Endotracheal tube terminates 11 mm above the carina. Retraction by approximately 2 cm is recommended. Mixed pattern pulmonary edema. Borderline enlarged cardiomediastinal silhouette. This may be due to portable AP technique, however vascular abnormality cannot be excluded. Please correlate clinically. These results will be called to the ordering clinician or representative by the Radiologist Assistant, and communication documented in the PACS or zVision Dashboard. Electronically Signed   By: Fidela Salisbury M.D.   On: 03/05/2017 12:37    Cardiac Studies   TTE 03/03/2017: Study Conclusions  - Left ventricle: The cavity size was normal. There was mild concentric hypertrophy. Systolic function was normal. The estimated ejection fraction was in the range of 55% to 60%. Wall motion was normal;  there were no regional wall motion abnormalities. - Aortic valve: There was mild regurgitation. - Pulmonary arteries: Systolic pressure was mildly to moderately increased. PA peak pressure: 46 mm Hg (S).  Impressions:  - Limited echo to evaluate ejection fraction. When compared to recent echo, LV systolic function normalized completely with improvement in ejection fraction of from 10% to 55%.  LHC 02/26/2017: Conclusion   Conclusions: 1. No angiographically significant coronary artery disease. 2. Severely reduced left ventricular contraction (<25%) with moderately elevated left ventricular filling pressure (LVEDP 28 mmHg).  Recommendations: 1. Hypothermia protocol per hospitalist and ICU teams. 2. Check magnesium level; replete electrolytes for goal potassium and magnesium greater than 4.0 and 2.0, respectively. 3. Consider gentle  diuresis, given elevated LVEDP and severely reduced LVEF. 4. Obtain transthoracic echocardiogram    TTE 02/26/2017: Study Conclusions  - Left ventricle: The cavity size was mildly dilated. Systolic function was severely reduced. The estimated ejection fraction was <20%. Diffuse hypokinesis. Regional wall motion abnormalities cannot be excluded. The study is not technically sufficient to allow evaluation of LV diastolic function. - Mitral valve: There was mild regurgitation. - Right ventricle: Systolic function was mildly reduced. - Pulmonary arteries: Systolic pressure could not be accurately estimated. - Inferior vena cava: The vessel was normal in size. The respirophasic diameter changes were in the normal range (>= 50%), consistent with normal central venous pressure.   Patient Profile     56 y.o. female with history of HTN, migraine disorder, and dependent edema who has had diarrhea for the past 2-3 days, taking up to 12 Imodium daily presented to [facility] with VF arrest. Cardiac cath showed no significant CAD.  Assessment & Plan    1. VF arrest: -Likely due to electrolyte abnormalities. Continue supportive care. No evidence of arrhythmia.  2. Acute hypoxic respiratory failure: - She remains intubated with difficulty weaning likely due to anoxic brain injury.  3. Acute systolic CHF: -Severely reduced LVEF by LV gram -Echo 8/22 with an EF of 10-15% with global hypokinesis -Repeat echo 8/27 showed improved EF to 55-60%, no RWMA -Off milrinone as of 8/28   4. Cardiogenic shock: -Resolved  5. AKI: - Resolved  6. Mental status: Possible anoxic brain injury given CPR time of 20 minutes. Could also be due to accumulated sedation effect. Continue to monitor closely. MRI brain showed no acute injuries. She is followed by neurology.  I discussed the case with Dr. Mortimer Fries.   Signed, Kathlyn Sacramento, MD [facility] 03/06/2017, 8:25 AM

## 2017-03-06 NOTE — Progress Notes (Signed)
MEDICATION RELATED CONSULT NOTE  Pharmacy Consult for Electrolyte Monitoring Indication: Hypomagnesemia  Allergies  Allergen Reactions  . Ace Inhibitors   . Compazine [Prochlorperazine Edisylate]   . Lisinopril   . Penicillins     Has patient had a PCN reaction causing immediate rash, facial/tongue/throat swelling, SOB or lightheadedness with hypotension: Unknown Has patient had a PCN reaction causing severe rash involving mucus membranes or skin necrosis: Unknown Has patient had a PCN reaction that required hospitalization: Unknown Has patient had a PCN reaction occurring within the last 10 years: Unknown If all of the above answers are "NO", then may proceed with Cephalosporin use.     Patient Measurements: Height: 5\' 2"  (157.5 cm) Weight: 203 lb 11.3 oz (92.4 kg) IBW/kg (Calculated) : 50.1  Vital Signs: Temp: 98 F (36.7 C) (08/30 2000) Temp Source: Oral (08/30 2000) BP: 119/49 (08/30 2130) Pulse Rate: 51 (08/30 2130) Intake/Output from previous day: 08/29 0701 - 08/30 0700 In: 364.8 [I.V.:244.8; NG/GT:20; IV Piggyback:100] Out: 2430 [Urine:2130; Stool:300] Intake/Output from this shift: Total I/O In: 11.6 [I.V.:11.6] Out: 0   Labs:  Recent Labs  03/04/17 0500 03/05/17 0404 03/06/17 0435  CREATININE 0.67 0.54 0.60  MG 2.6*  --  1.9  PHOS 4.5  --  3.0  ALBUMIN  --  2.3*  --   PROT  --  6.6  --   AST  --  110*  --   ALT  --  169*  --   ALKPHOS  --  117  --   BILITOT  --  0.6  --   BILIDIR  --  0.2  --   IBILI  --  0.4  --    Estimated Creatinine Clearance: 81.1 mL/min (by C-G formula based on SCr of 0.6 mg/dL).  Sodium (mmol/L)  Date Value  03/06/2017 147 (H)  08/16/2016 141   Potassium (mmol/L)  Date Value  03/06/2017 4.6   Calcium (mg/dL)  Date Value  37/34/2876 7.5 (L)   Albumin (g/dL)  Date Value  81/15/7262 2.3 (L)    Assessment: 56 y/o F s/p cardiac arrest and hypothermia protocol with hypomagnesemia. Due to high risk of recurrent  arrhythmia, cardiology wants a goal K of > or = 4 and Mg > or = 2. Patient ordered potassium IV Q1hr x 6 doses by NP overnight. Will order additional potassium VT x 1 and magnesium 2g IV x 1. Patient ordered furosemide 60mg  IV x 1 this am.   Plan:  No further replacement warranted at this time. Will recheck electrolytes with am labs.   Pharmacy will continue to monitor and adjust per consult.   Versia Mignogna L 03/06/2017,9:41 PM

## 2017-03-06 NOTE — Progress Notes (Signed)
After further assessment and discussion with Husband, He has made it crystal clear that patient would NOT want  trach and PEG Tubes or long term care facilities.   He also made it clear that she would NOT want to be resuscitated. He has agreed and consented to DNR status.   I have discussed plan of action with husband and he agrees with DNR status, will try to aggressively treat her underlying delirium and agitation.   Family are satisfied with Plan of action and management. All questions answered  Lucie Leather, M.D.  [facility]  Medical Director [facility] Medical Director [facility] Cardio-Pulmonary Department

## 2017-03-07 ENCOUNTER — Inpatient Hospital Stay: Payer: BC Managed Care – PPO

## 2017-03-07 LAB — BASIC METABOLIC PANEL
ANION GAP: 6 (ref 5–15)
BUN: 29 mg/dL — AB (ref 6–20)
CALCIUM: 7.6 mg/dL — AB (ref 8.9–10.3)
CO2: 30 mmol/L (ref 22–32)
CREATININE: 0.53 mg/dL (ref 0.44–1.00)
Chloride: 113 mmol/L — ABNORMAL HIGH (ref 101–111)
GFR calc Af Amer: >60 mL/min (ref >60)
GLUCOSE: 111 mg/dL — AB (ref 65–99)
Potassium: 3.5 mmol/L (ref 3.5–5.1)
Sodium: 149 mmol/L — ABNORMAL HIGH (ref 135–145)

## 2017-03-07 LAB — TRIGLYCERIDES: Triglycerides: 262 mg/dL — ABNORMAL HIGH (ref <150)

## 2017-03-07 LAB — PHOSPHORUS: Phosphorus: 2.7 mg/dL (ref 2.5–4.6)

## 2017-03-07 LAB — MAGNESIUM: Magnesium: 2.2 mg/dL (ref 1.7–2.4)

## 2017-03-07 MED ORDER — MIDAZOLAM HCL 2 MG/2ML IJ SOLN
4.0000 mg | Freq: Once | INTRAMUSCULAR | Status: AC
  Administered 2017-03-07: 4 mg via INTRAVENOUS
  Filled 2017-03-07: qty 4

## 2017-03-07 MED ORDER — LEVALBUTEROL HCL 0.63 MG/3ML IN NEBU
INHALATION_SOLUTION | RESPIRATORY_TRACT | Status: AC
  Filled 2017-03-07: qty 3

## 2017-03-07 MED ORDER — POTASSIUM CHLORIDE 10 MEQ/50ML IV SOLN
10.0000 meq | INTRAVENOUS | Status: AC
  Administered 2017-03-07 (×2): 10 meq via INTRAVENOUS
  Filled 2017-03-07 (×2): qty 50

## 2017-03-07 MED ORDER — VITAL HIGH PROTEIN PO LIQD
1000.0000 mL | ORAL | Status: DC
  Administered 2017-03-07: 1000 mL
  Administered 2017-03-08 (×4)
  Administered 2017-03-08: 1000 mL
  Administered 2017-03-08 – 2017-03-09 (×9)
  Administered 2017-03-09: 1000 mL
  Administered 2017-03-09 – 2017-03-10 (×7)
  Administered 2017-03-10: 1000 mL
  Administered 2017-03-10 – 2017-03-11 (×7)

## 2017-03-07 MED ORDER — FENTANYL CITRATE (PF) 100 MCG/2ML IJ SOLN
100.0000 ug | Freq: Once | INTRAMUSCULAR | Status: AC
  Administered 2017-03-07: 100 ug via INTRAVENOUS
  Filled 2017-03-07: qty 2

## 2017-03-07 MED ORDER — METHYLPREDNISOLONE SODIUM SUCC 40 MG IJ SOLR
20.0000 mg | Freq: Two times a day (BID) | INTRAMUSCULAR | Status: DC
  Administered 2017-03-07 – 2017-03-11 (×8): 20 mg via INTRAVENOUS
  Filled 2017-03-07 (×8): qty 1

## 2017-03-07 MED ORDER — PROPOFOL 1000 MG/100ML IV EMUL
5.0000 ug/kg/min | INTRAVENOUS | Status: DC
Start: 2017-03-07 — End: 2017-03-11
  Administered 2017-03-07 – 2017-03-10 (×4): 5 ug/kg/min via INTRAVENOUS
  Filled 2017-03-07 (×4): qty 100

## 2017-03-07 MED ORDER — OXYCODONE HCL 5 MG/5ML PO SOLN
2.5000 mg | Freq: Three times a day (TID) | ORAL | Status: DC
  Administered 2017-03-07 – 2017-03-10 (×10): 2.5 mg via ORAL
  Filled 2017-03-07 (×10): qty 5

## 2017-03-07 MED ORDER — POTASSIUM CHLORIDE 20 MEQ PO PACK
40.0000 meq | PACK | Freq: Four times a day (QID) | ORAL | Status: DC
  Administered 2017-03-07 – 2017-03-08 (×5): 40 meq via ORAL
  Filled 2017-03-07 (×5): qty 2

## 2017-03-07 MED ORDER — QUETIAPINE FUMARATE 25 MG PO TABS
50.0000 mg | ORAL_TABLET | Freq: Every day | ORAL | Status: DC
  Administered 2017-03-07 – 2017-03-12 (×6): 50 mg
  Filled 2017-03-07 (×6): qty 2

## 2017-03-07 MED ORDER — FUROSEMIDE 10 MG/ML IJ SOLN
60.0000 mg | Freq: Once | INTRAMUSCULAR | Status: AC
  Administered 2017-03-07: 60 mg via INTRAVENOUS
  Filled 2017-03-07: qty 6

## 2017-03-07 MED ORDER — LEVALBUTEROL HCL 0.63 MG/3ML IN NEBU
INHALATION_SOLUTION | RESPIRATORY_TRACT | Status: AC
  Administered 2017-03-07: 1.25 mg
  Filled 2017-03-07: qty 3

## 2017-03-07 NOTE — Progress Notes (Signed)
MEDICATION RELATED CONSULT NOTE  Pharmacy Consult for Electrolyte Monitoring Indication: Hypomagnesemia  Allergies  Allergen Reactions  . Ace Inhibitors   . Compazine [Prochlorperazine Edisylate]   . Lisinopril   . Penicillins     Has patient had a PCN reaction causing immediate rash, facial/tongue/throat swelling, SOB or lightheadedness with hypotension: Unknown Has patient had a PCN reaction causing severe rash involving mucus membranes or skin necrosis: Unknown Has patient had a PCN reaction that required hospitalization: Unknown Has patient had a PCN reaction occurring within the last 10 years: Unknown If all of the above answers are "NO", then may proceed with Cephalosporin use.     Patient Measurements: Height: 5\' 2"  (157.5 cm) Weight: 185 lb 13.6 oz (84.3 kg) IBW/kg (Calculated) : 50.1  Vital Signs: Temp: 100.2 F (37.9 C) (08/31 1200) Temp Source: Axillary (08/31 1200) BP: 138/72 (08/31 1400) Pulse Rate: 60 (08/31 1400) Intake/Output from previous day: 08/30 0701 - 08/31 0700 In: 397.1 [I.V.:277.1; NG/GT:120] Out: 2395 [Urine:1845; Stool:550] Intake/Output from this shift: Total I/O In: 174.8 [I.V.:174.8] Out: -   Labs:  Recent Labs  03/05/17 0404 03/06/17 0435 03/07/17 0351  CREATININE 0.54 0.60 0.53  MG  --  1.9 2.2  PHOS  --  3.0 2.7  ALBUMIN 2.3*  --   --   PROT 6.6  --   --   AST 110*  --   --   ALT 169*  --   --   ALKPHOS 117  --   --   BILITOT 0.6  --   --   BILIDIR 0.2  --   --   IBILI 0.4  --   --    Estimated Creatinine Clearance: 77.2 mL/min (by C-G formula based on SCr of 0.53 mg/dL).  Sodium (mmol/L)  Date Value  03/07/2017 149 (H)  08/16/2016 141   Potassium (mmol/L)  Date Value  03/07/2017 3.5   Calcium (mg/dL)  Date Value  75/91/6384 7.6 (L)   Albumin (g/dL)  Date Value  66/59/9357 2.3 (L)    Assessment: 56 y/o F s/p cardiac arrest and hypothermia protocol with hypomagnesemia. Due to high risk of recurrent  arrhythmia, cardiology wants a goal K of > or = 4 and Mg > or = 2.   Plan:  Potassium replaced both orally and IV. Will f/u AM labs.   Pharmacy will continue to monitor and adjust per consult.   Luisa Hart D 03/07/2017,3:18 PM

## 2017-03-07 NOTE — Progress Notes (Signed)
CH made a follow up visit. CH attended consultation with MD at family request. Pt appears to be better in some respects today. Husband has many questions about the Pt care, and ultimately expressed confidence in the care given his wife. Daughter concurred. CH is available for follow up as needed.    03/07/17 1100  Clinical Encounter Type  Visited With Patient;Patient and family together;Health care provider  Visit Type Follow-up  Consult/Referral To Chaplain  Spiritual Encounters  Spiritual Needs Prayer;Emotional

## 2017-03-07 NOTE — Progress Notes (Signed)
[facility] ADULT ICU REPLACEMENT PROTOCOL FOR AM LAB REPLACEMENT ONLY  The patient does apply for the [facility] Adult ICU Electrolyte Replacment Protocol based on the criteria listed below:   1. Is GFR >/= 40 ml/min? Yes.    Patient's GFR today is >60 2. Is urine output >/= 0.5 ml/kg/hr for the last 6 hours? Yes.   Patient's UOP is 0.52 ml/kg/hr 3. Is BUN < 60 mg/dL? Yes.    Patient's BUN today is 29 4. Abnormal electrolyte(s): 3.5 5. Ordered repletion with: per protocol 6. If a panic level lab has been reported, has the CCM MD in charge been notified? Yes.  .   Physician:  Dr. Mendel Corning, [place]os 03/07/2017 5:00 AM

## 2017-03-07 NOTE — Progress Notes (Signed)
Subjective: Patient remains intubated and on Precedex but somewhat improved today.    Objective: Current vital signs: BP (!) 167/82   Pulse 96   Temp 99.1 F (37.3 C) (Oral)   Resp (!) 38   Ht '5\' 2"'  (1.575 m)   Wt 84.3 kg (185 lb 13.6 oz)   SpO2 99%   BMI 33.99 kg/m  Vital signs in last 24 hours: Temp:  [97.7 F (36.5 C)-99.8 F (37.7 C)] 99.1 F (37.3 C) (08/31 0400) Pulse Rate:  [45-120] 96 (08/31 0630) Resp:  [13-43] 38 (08/31 0630) BP: (80-167)/(49-135) 167/82 (08/31 0630) SpO2:  [91 %-99 %] 99 % (08/31 1120) FiO2 (%):  [35 %] 35 % (08/31 1120) Weight:  [84.3 kg (185 lb 13.6 oz)] 84.3 kg (185 lb 13.6 oz) (08/31 0400)  Intake/Output from previous day: 08/30 0701 - 08/31 0700 In: 375.5 [I.V.:255.5; NG/GT:120] Out: 2395 [Urine:1845; Stool:550] Intake/Output this shift: No intake/output data recorded. Nutritional status:    Neurologic Exam: Mental Status: Eyes open when name called and appears to fix on examiner.  No speech.  Follows some commands, particularly on the right.  Localizes to pain.   Cranial Nerves: II: Discs flat bilaterally III,IV, VI: ptosis not present, extra-ocular motions intact bilaterally V,VII: corneals intact bilaterally VIII: unable to test IX,X: unable to test XI: unable to test XII: unable to test Motor: Moves all extremities spontaneously and localizes to pain with both upper extremities.  Moves only the right side to command.   Sensory: Responds to noxious stimuli throughout   Lab Results: Basic Metabolic Panel:  Recent Labs Lab 03/01/17 0606  03/02/17 2059 03/03/17 0445 03/04/17 0500 03/05/17 0404 03/06/17 0435 03/06/17 2037 03/07/17 0351  NA 135  < > 138 136 137 145 147*  --  149*  K 4.8  < > 4.3 4.1 4.5 3.9 2.6* 4.6 3.5  CL 108  < > 109 108 105 111 115*  --  113*  CO2 20*  < > '22 22 25 27 26  ' --  30  GLUCOSE 120*  < > 100* 149* 138* 117* 79  --  111*  BUN 16  < > 27* 30* 36* 41* 27*  --  29*  CREATININE 0.84  < >  0.76 0.72 0.67 0.54 0.60  --  0.53  CALCIUM 8.3*  < > 8.2* 8.1* 7.6* 8.1* 7.5*  --  7.6*  MG 2.0  < > 1.9 1.9 2.6*  --  1.9  --  2.2  PHOS 2.5  --   --   --  4.5  --  3.0  --  2.7  < > = values in this interval not displayed.  Liver Function Tests:  Recent Labs Lab 03/05/17 0404  AST 110*  ALT 169*  ALKPHOS 117  BILITOT 0.6  PROT 6.6  ALBUMIN 2.3*   No results for input(s): LIPASE, AMYLASE in the last 168 hours.  Recent Labs Lab 03/05/17 0404  AMMONIA 44*    CBC:  Recent Labs Lab 03/01/17 0606 03/02/17 0741 03/03/17 0445  WBC 26.5* 23.9* 19.7*  NEUTROABS 25.0* 22.4* 17.9*  HGB 10.5* 8.8* 8.5*  HCT 30.3* 25.7* 24.6*  MCV 89.4 88.4 89.8  PLT 266 225 191    Cardiac Enzymes: No results for input(s): CKTOTAL, CKMB, CKMBINDEX, TROPONINI in the last 168 hours.  Lipid Panel:  Recent Labs Lab 03/02/17 2059 03/06/17 0435  TRIG 101 329*    CBG:  Recent Labs Lab 03/05/17 1635 03/06/17 0710 03/06/17 0745 03/06/17  1106 03/06/17 1631  GLUCAP 87 25* 145* 60 102*    Microbiology: Results for orders placed or performed during the hospital encounter of 02/25/17  MRSA PCR Screening     Status: None   Collection Time: 02/26/17  1:36 AM  Result Value Ref Range Status   MRSA by PCR NEGATIVE NEGATIVE Final    Comment:        The GeneXpert MRSA Assay (FDA approved for NASAL specimens only), is one component of a comprehensive MRSA colonization surveillance program. It is not intended to diagnose MRSA infection nor to guide or monitor treatment for MRSA infections.   Blood culture (routine x 2)     Status: None   Collection Time: 02/26/17  2:08 AM  Result Value Ref Range Status   Specimen Description BLOOD LEFT WRIST  Final   Special Requests   Final    BOTTLES DRAWN AEROBIC AND ANAEROBIC Blood Culture adequate volume   Culture NO GROWTH 5 DAYS  Final   Report Status 03/03/2017 FINAL  Final  Blood culture (routine x 2)     Status: None   Collection  Time: 02/26/17  3:40 AM  Result Value Ref Range Status   Specimen Description BLOOD PORT  Final   Special Requests   Final    BOTTLES DRAWN AEROBIC AND ANAEROBIC Blood Culture adequate volume   Culture NO GROWTH 5 DAYS  Final   Report Status 03/03/2017 FINAL  Final  Urine Culture     Status: None   Collection Time: 02/28/17 10:18 AM  Result Value Ref Range Status   Specimen Description URINE, CATHETERIZED  Final   Special Requests Normal  Final   Culture   Final    NO GROWTH Performed at [facility], [place] [address] [place], [place] [address]    Report Status 03/01/2017 FINAL  Final  Culture, respiratory (NON-Expectorated)     Status: None   Collection Time: 02/28/17 10:45 AM  Result Value Ref Range Status   Specimen Description TRACHEAL ASPIRATE  Final   Special Requests NONE  Final   Gram Stain   Final    ABUNDANT WBC PRESENT,BOTH PMN AND MONONUCLEAR FEW SQUAMOUS EPITHELIAL CELLS PRESENT MODERATE GRAM POSITIVE COCCI FEW GRAM NEGATIVE RODS RARE GRAM POSITIVE RODS RARE GRAM NEGATIVE COCCI Performed at [facility], [place] [address] [place], [place] [address]    Culture ABUNDANT STAPHYLOCOCCUS AUREUS  Final   Report Status 03/02/2017 FINAL  Final   Organism ID, Bacteria STAPHYLOCOCCUS AUREUS  Final      Susceptibility   Staphylococcus aureus - MIC*    CIPROFLOXACIN <=0.5 SENSITIVE Sensitive     ERYTHROMYCIN <=0.25 SENSITIVE Sensitive     GENTAMICIN <=0.5 SENSITIVE Sensitive     OXACILLIN <=0.25 SENSITIVE Sensitive     TETRACYCLINE <=1 SENSITIVE Sensitive     VANCOMYCIN <=0.5 SENSITIVE Sensitive     TRIMETH/SULFA <=10 SENSITIVE Sensitive     CLINDAMYCIN <=0.25 SENSITIVE Sensitive     RIFAMPIN <=0.5 SENSITIVE Sensitive     Inducible Clindamycin NEGATIVE Sensitive     * ABUNDANT STAPHYLOCOCCUS AUREUS  C difficile quick scan w PCR reflex     Status: None   Collection Time: 03/04/17 11:40 AM  Result Value Ref Range Status   C Diff antigen NEGATIVE NEGATIVE  Final   C Diff toxin NEGATIVE NEGATIVE Final   C Diff interpretation No C. difficile detected.  Final    Coagulation Studies: No results for input(s): LABPROT, INR in the  last 72 hours.  Imaging: Dg Chest Port 1 View  Result Date: 03/07/2017 CLINICAL DATA:  Acute respiratory failure . EXAM: PORTABLE CHEST 1 VIEW COMPARISON:  03/06/2017 . FINDINGS: Endotracheal tube, NG tube, right PICC line stable position. Stable cardiomegaly. Diffuse bilateral pulmonary interstitial prominence consistent CHF again noted. Slight interval clearing. Small left pleural effusion. No pneumothorax . IMPRESSION: 1. Lines and tubes in stable position. 2. Cardiomegaly with diffuse bilateral from interstitial prominence consistent CHF again noted. Slight interval clearing. Small left pleural effusion. Electronically Signed   By: Marcello Moores  Register   On: 03/07/2017 06:17   Dg Chest Port 1 View  Result Date: 03/06/2017 CLINICAL DATA:  Respiratory failure EXAM: PORTABLE CHEST 1 VIEW COMPARISON:  03/05/2017 FINDINGS: Endotracheal tube and NG tube as well as right PICC line remain in place, unchanged. Diffuse bilateral airspace disease, slightly worsened since prior study. Cardiomegaly. IMPRESSION: Worsening diffuse bilateral airspace disease, likely edema/ CHF. Electronically Signed   By: Rolm Baptise M.D.   On: 03/06/2017 07:11   Dg Chest Port 1 View  Result Date: 03/05/2017 CLINICAL DATA:  Post cardiac arrest.  Respiratory failure. EXAM: PORTABLE CHEST 1 VIEW COMPARISON:  03/04/2017 FINDINGS: Endotracheal tube terminates 11 mm above the carina. Retraction by approximately 2 cm is recommended. Enteric catheter tip is collimated off the image. Right PICC line in stable position with tip overlying the expected position of superior vena cava. Mildly enlarged cardiomediastinal silhouette. No evidence of pneumothorax. Low lung volumes with mixed pattern pulmonary edema. Osseous structures are without acute abnormality. Soft tissues  are grossly normal. IMPRESSION: Endotracheal tube terminates 11 mm above the carina. Retraction by approximately 2 cm is recommended. Mixed pattern pulmonary edema. Borderline enlarged cardiomediastinal silhouette. This may be due to portable AP technique, however vascular abnormality cannot be excluded. Please correlate clinically. These results will be called to the ordering clinician or representative by the Radiologist Assistant, and communication documented in the PACS or zVision Dashboard. Electronically Signed   By: Fidela Salisbury M.D.   On: 03/05/2017 12:37    Medications:  I have reviewed the patient's current medications. Scheduled: . budesonide (PULMICORT) nebulizer solution  0.5 mg Nebulization BID  . chlorhexidine gluconate (MEDLINE KIT)  15 mL Mouth Rinse BID  . famotidine  20 mg Per Tube BID  . feeding supplement (PRO-STAT SUGAR FREE 64)  60 mL Per Tube BID  . feeding supplement (VITAL HIGH PROTEIN)  1,000 mL Per Tube Q24H  . fentaNYL (SUBLIMAZE) injection  100 mcg Intravenous Once  . furosemide  60 mg Intravenous Once  . heparin  5,000 Units Subcutaneous Q8H  . ipratropium-albuterol  3 mL Nebulization Q6H  . levalbuterol      . mouth rinse  15 mL Mouth Rinse 10 times per day  . midazolam  4 mg Intravenous Once  . multivitamin  15 mL Per Tube Daily  . potassium chloride  40 mEq Oral Q6H  . QUEtiapine  25 mg Oral QHS  . sodium chloride flush  10-40 mL Intracatheter Q12H    Assessment/Plan: Patient some improved today.  Seems to focus and follow some commands.  Conference had with family at length, both alone and with the team lasting 45 minutes in total.  Plan remains the same for DNR and no trach or PEG.    Will continue to follow with you.     LOS: 9 days   Alexis Goodell, MD Neurology [phone] 03/07/2017  11:57 AM

## 2017-03-07 NOTE — Progress Notes (Signed)
Family from out of town arrived at bedside.  Requested that they promote patient rest at this time.

## 2017-03-07 NOTE — Progress Notes (Signed)
   03/07/17 0452  Vitals  Pulse Rate (!) 45  ECG Heart Rate (!) 41 (HR as low as 29  Shana Aware)  Cardiac Rhythm SB (Shana Aware)  Ectopy Other (Comment) (Pauses 1.90 to 2.72     Shana Aware)  Ectopy Frequency Frequent  Resp (!) 21   Paused precedex

## 2017-03-07 NOTE — Progress Notes (Signed)
Patient Name: Miranda [name] Date of Encounter: 03/07/2017  Primary Cardiologist: New to [facility] - consult by End  Hospital Problem List     Principal Problem:   Cardiac arrest with ventricular fibrillation [facility]) Active Problems:   Cardiac arrest ([place])   Acute respiratory failure ([place])   Hypokalemia   Encounter for central line placement   Cardiogenic shock ([place])   Acute pulmonary edema ([facility])   Anoxic encephalopathy ([place])   Bradycardia     Subjective   Remains intubated. She is on small dose Precedex drip with episodes of agitation. She had intermittent bradycardia and which does not last long. She still not following commands.  Inpatient Medications    Scheduled Meds: . budesonide (PULMICORT) nebulizer solution  0.5 mg Nebulization BID  . chlorhexidine gluconate (MEDLINE KIT)  15 mL Mouth Rinse BID  . famotidine  20 mg Per Tube BID  . feeding supplement (PRO-STAT SUGAR FREE 64)  60 mL Per Tube BID  . feeding supplement (VITAL HIGH PROTEIN)  1,000 mL Per Tube Q24H  . heparin  5,000 Units Subcutaneous Q8H  . ipratropium-albuterol  3 mL Nebulization Q6H  . mouth rinse  15 mL Mouth Rinse 10 times per day  . multivitamin  15 mL Per Tube Daily  . potassium chloride  40 mEq Oral Q6H  . QUEtiapine  25 mg Oral QHS  . sodium chloride flush  10-40 mL Intracatheter Q12H   Continuous Infusions: . cefTRIAXone (ROCEPHIN)  IV Stopped (03/06/17 1159)  . dexmedetomidine (PRECEDEX) IV infusion 1.2 mcg/kg/hr (03/07/17 2836)   PRN Meds: acetaminophen, atropine, sennosides **AND** docusate, levalbuterol, naLOXone (NARCAN)  injection, pneumococcal 23 valent vaccine, sodium chloride flush, vecuronium   Vital Signs    Vitals:   03/07/17 0500 03/07/17 0530 03/07/17 0600 03/07/17 0630  BP: (!) 120/59  (!) 162/135 (!) 167/82  Pulse: (!) 52 82 73 96  Resp: (!) 26 (!) 33 (!) 36 (!) 38  Temp:      TempSrc:      SpO2: 96% 99% 99% 98%  Weight:      Height:        Intake/Output Summary  (Last 24 hours) at 03/07/17 0823 Last data filed at 03/07/17 0600  Gross per 24 hour  Intake           375.51 ml  Output             2395 ml  Net         -2019.49 ml   Filed Weights   03/05/17 0411 03/06/17 0430 03/07/17 0400  Weight: 199 lb 11.8 oz (90.6 kg) 203 lb 11.3 oz (92.4 kg) 185 lb 13.6 oz (84.3 kg)    Physical Exam    GEN: Critically ill appearing, in no acute distress.  HEENT: Grossly normal.  Neck: Supple, no JVD, carotid bruits, or masses. Cardiac: Bradycardic, no murmurs, rubs, or gallops. No clubbing, cyanosis, edema.  Radials/DP/PT 2+ and equal bilaterally.  Respiratory:  Diminished breath sounds bilaterally. Intubated. GI: Soft, nontender, nondistended, BS + x 4. MS: no deformity or atrophy. Skin: warm and dry, no rash. Neuro:  Intubated and sedated. Psych: Intubated and sedated.  Labs    CBC No results for input(s): WBC, NEUTROABS, HGB, HCT, MCV, PLT in the last 72 hours. Basic Metabolic Panel  Recent Labs  03/06/17 0435 03/06/17 2037 03/07/17 0351  NA 147*  --  149*  K 2.6* 4.6 3.5  CL 115*  --  113*  CO2 26  --  30  GLUCOSE 79  --  111*  BUN 27*  --  29*  CREATININE 0.60  --  0.53  CALCIUM 7.5*  --  7.6*  MG 1.9  --  2.2  PHOS 3.0  --  2.7   Liver Function Tests  Recent Labs  03/05/17 0404  AST 110*  ALT 169*  ALKPHOS 117  BILITOT 0.6  PROT 6.6  ALBUMIN 2.3*   No results for input(s): LIPASE, AMYLASE in the last 72 hours. Cardiac Enzymes No results for input(s): CKTOTAL, CKMB, CKMBINDEX, TROPONINI in the last 72 hours. BNP Invalid input(s): POCBNP D-Dimer No results for input(s): DDIMER in the last 72 hours. Hemoglobin A1C No results for input(s): HGBA1C in the last 72 hours. Fasting Lipid Panel  Recent Labs  03/06/17 0435  TRIG 329*   Thyroid Function Tests No results for input(s): TSH, T4TOTAL, T3FREE, THYROIDAB in the last 72 hours.  Invalid input(s): FREET3  Telemetry    Normal sinus rhythm with sinus  bradycardia - Personally Reviewed  ECG    n/a - Personally Reviewed  Radiology    Dg Chest Port 1 View  Result Date: 03/07/2017 CLINICAL DATA:  Acute respiratory failure . EXAM: PORTABLE CHEST 1 VIEW COMPARISON:  03/06/2017 . FINDINGS: Endotracheal tube, NG tube, right PICC line stable position. Stable cardiomegaly. Diffuse bilateral pulmonary interstitial prominence consistent CHF again noted. Slight interval clearing. Small left pleural effusion. No pneumothorax . IMPRESSION: 1. Lines and tubes in stable position. 2. Cardiomegaly with diffuse bilateral from interstitial prominence consistent CHF again noted. Slight interval clearing. Small left pleural effusion. Electronically Signed   By: Marcello Moores  Register   On: 03/07/2017 06:17   Dg Chest Port 1 View  Result Date: 03/06/2017 CLINICAL DATA:  Respiratory failure EXAM: PORTABLE CHEST 1 VIEW COMPARISON:  03/05/2017 FINDINGS: Endotracheal tube and NG tube as well as right PICC line remain in place, unchanged. Diffuse bilateral airspace disease, slightly worsened since prior study. Cardiomegaly. IMPRESSION: Worsening diffuse bilateral airspace disease, likely edema/ CHF. Electronically Signed   By: Rolm Baptise M.D.   On: 03/06/2017 07:11   Dg Chest Port 1 View  Result Date: 03/05/2017 CLINICAL DATA:  Post cardiac arrest.  Respiratory failure. EXAM: PORTABLE CHEST 1 VIEW COMPARISON:  03/04/2017 FINDINGS: Endotracheal tube terminates 11 mm above the carina. Retraction by approximately 2 cm is recommended. Enteric catheter tip is collimated off the image. Right PICC line in stable position with tip overlying the expected position of superior vena cava. Mildly enlarged cardiomediastinal silhouette. No evidence of pneumothorax. Low lung volumes with mixed pattern pulmonary edema. Osseous structures are without acute abnormality. Soft tissues are grossly normal. IMPRESSION: Endotracheal tube terminates 11 mm above the carina. Retraction by approximately 2  cm is recommended. Mixed pattern pulmonary edema. Borderline enlarged cardiomediastinal silhouette. This may be due to portable AP technique, however vascular abnormality cannot be excluded. Please correlate clinically. These results will be called to the ordering clinician or representative by the Radiologist Assistant, and communication documented in the PACS or zVision Dashboard. Electronically Signed   By: Fidela Salisbury M.D.   On: 03/05/2017 12:37    Cardiac Studies   TTE 03/03/2017: Study Conclusions  - Left ventricle: The cavity size was normal. There was mild concentric hypertrophy. Systolic function was normal. The estimated ejection fraction was in the range of 55% to 60%. Wall motion was normal; there were no regional wall motion abnormalities. - Aortic valve: There was mild regurgitation. - Pulmonary arteries: Systolic pressure was mildly  to moderately increased. PA peak pressure: 46 mm Hg (S).  Impressions:  - Limited echo to evaluate ejection fraction. When compared to recent echo, LV systolic function normalized completely with improvement in ejection fraction of from 10% to 55%.  LHC 02/26/2017: Conclusion   Conclusions: 1. No angiographically significant coronary artery disease. 2. Severely reduced left ventricular contraction (<25%) with moderately elevated left ventricular filling pressure (LVEDP 28 mmHg).  Recommendations: 1. Hypothermia protocol per hospitalist and ICU teams. 2. Check magnesium level; replete electrolytes for goal potassium and magnesium greater than 4.0 and 2.0, respectively. 3. Consider gentle diuresis, given elevated LVEDP and severely reduced LVEF. 4. Obtain transthoracic echocardiogram    TTE 02/26/2017: Study Conclusions  - Left ventricle: The cavity size was mildly dilated. Systolic function was severely reduced. The estimated ejection fraction was <20%. Diffuse hypokinesis. Regional wall motion  abnormalities cannot be excluded. The study is not technically sufficient to allow evaluation of LV diastolic function. - Mitral valve: There was mild regurgitation. - Right ventricle: Systolic function was mildly reduced. - Pulmonary arteries: Systolic pressure could not be accurately estimated. - Inferior vena cava: The vessel was normal in size. The respirophasic diameter changes were in the normal range (>= 50%), consistent with normal central venous pressure.   Patient Profile     56 y.o. female with history of HTN, migraine disorder, and dependent edema who has had diarrhea for the past 2-3 days, taking up to 12 Imodium daily presented to [facility] with VF arrest. Cardiac cath showed no significant CAD.  Assessment & Plan    1. VF arrest: -Likely due to electrolyte abnormalities. Continue supportive care. EF improved to normal on subsequent echo. Bradycardia could be due to Precedex versus high vagal tone. Continue to monitor closely. Her bradycardia has not been persistent. Small dose atropine 0.5-1 mg can be used as needed.  2. Acute hypoxic respiratory failure: - She remains intubated with difficulty weaning likely due to anoxic brain injury. Plans are to keep arrest with attempted extubation next week. The husband expressed the patient did not want to be on prolonged life support and thus it is against tracheostomy.  3. Acute systolic CHF: -Severely reduced LVEF by LV gram -Echo 8/22 with an EF of 10-15% with global hypokinesis -Repeat echo 8/27 showed improved EF to 55-60%, no RWMA -Off milrinone as of 8/28 - Chest x-ray shows mild pulmonary vascular congestion. Consider small dose furosemide 20 mg.  4. Cardiogenic shock: -Resolved  5. AKI: - Resolved  6. Mental status: Possible anoxic brain injury given CPR time of 20 minutes. Could also be due to accumulated sedation effect. Continue to monitor closely. MRI brain showed no acute injuries. She is followed by  neurology.  I discussed the case with the patient's husband who was at the bedside.  Signed, Kathlyn Sacramento, MD [facility] 03/07/2017, 8:23 AM

## 2017-03-07 NOTE — Procedures (Signed)
PROCEDURE: BRONCHOSCOPY Therapeutic Aspiration of Tracheobronchial Tree  PROCEDURE DATE: 03/07/2017  TIME:  NAME:  Miranda [name]  DOB:1961-01-24  MRN: [id number] LOC:  IC06A/IC06A-AA    HOSP DAY: @LENGTHOFSTAYDAYS @ CODE STATUS:      Code Status Orders        Start     Ordered   03/06/17 1118  Do not attempt resuscitation (DNR)  Continuous    Question Answer Comment  In the event of cardiac or respiratory ARREST Do not call a "code blue"   In the event of cardiac or respiratory ARREST Do not perform Intubation, CPR, defibrillation or ACLS   In the event of cardiac or respiratory ARREST Use medication by any route, position, wound care, and other measures to relive pain and suffering. May use oxygen, suction and manual treatment of airway obstruction as needed for comfort.      03/06/17 1117    Code Status History    Date Active Date Inactive Code Status Order ID Comments User Context   02/26/2017  2:05 AM 03/06/2017 11:17 AM Full Code [id number]  Gwendolyn Fill, NP Inpatient   02/26/2017  1:27 AM 02/26/2017  2:05 AM Full Code [id number]  End, Cristal Deer, MD Inpatient          Indications/Preliminary Diagnosis:   Consent: (Place X beside choice/s below)  The benefits, risks and possible complications of the procedure were        explained to:  ___ patient  _x__ patient's family  ___ other:___________  who verbalized understanding and gave:  ___ verbal  ___ written  _x__ verbal and written  ___ telephone  ___ other:________ consent.      Unable to obtain consent; procedure performed on emergent basis.     Other:       PRESEDATION ASSESSMENT: History and Physical has been performed. Patient meds and allergies have been reviewed. Presedation airway examination has been performed and documented. Baseline vital signs, sedation score, oxygenation status, and cardiac rhythm were reviewed. Patient was deemed to be in satisfactory condition to undergo the procedure.     PROCEDURE DETAILS: Timeout performed and correct patient, name, & ID confirmed. Following prep per Pulmonary policy, appropriate sedation was administered. The Bronchoscope was inserted in to oral cavity with bite block in place. Therapeutic aspiration of Tracheobronchial tree was performed.  Airway exam proceeded with findings, technical procedures, and specimen collection as noted below. At the end of exam the scope was withdrawn without incident. Impression and Plan as noted below.           Airway Prep (Place X beside choice below)   1% Transtracheal Lidocaine Anesthetization 7 cc   Patient prepped per Bronchoscopy Lab Policy       Insertion Route (Place X beside choice below)   Nasal   Oral  x Endotracheal Tube   Tracheostomy   INTRAPROCEDURE MEDICATIONS:  Sedative/Narcotic Amt Dose   Versed 4 mg   Fentanyl 100 mcg  Diprivan  mg  VECURONIUM 10 mg   Medication Amt Dose  Medication Amt Dose  Lidocaine 1% 5 cc  Epinephrine 1:10,000 sol  cc  Xylocaine 4%  cc  Cocaine  cc   TECHNICAL PROCEDURES: (Place X beside choice below)   Procedures  Description    None     Electrocautery     Cryotherapy     Balloon Dilatation     Bronchography     Stent Placement   x  Therapeutic Aspiration Thick purulent secretions  Laser/Argon Plasma    Brachytherapy Catheter Placement    Foreign Body Removal         SPECIMENS (Sites): (Place X beside choice below)  Specimens Description   No Specimens Obtained     Washings   x Lavage Somewhat cloudy approx 10 ml from RML   Biopsies    Fine Needle Aspirates    Brushings    Sputum    FINDINGS: thick purulent secretions from trachea mostly, inflamed mucosa ESTIMATED BLOOD LOSS: none COMPLICATIONS/RESOLUTION: none    IMPRESSION:POST-PROCEDURE DX:  tracheitis   RECOMMENDATION/PLAN:  Check cultures    Lucie Leather, M.D.  [facility]  Medical Director [facility] Medical  Director [facility] Cardio-Pulmonary Department

## 2017-03-07 NOTE — Progress Notes (Signed)
[facility] [place] [facility]   PATIENT NAME: Miranda [name]    MR#:  [id number]  DATE OF BIRTH:  Feb 09, 1961  SUBJECTIVE:   Intubated,sedated and on the vent .  tapered sedation, low dose precedex now. More calm and follows simple commands today.  REVIEW OF SYSTEMS:   Review of Systems  Unable to perform ROS: Intubated   DRUG ALLERGIES:   Allergies  Allergen Reactions  . Ace Inhibitors   . Compazine [Prochlorperazine Edisylate]   . Lisinopril   . Penicillins     Has patient had a PCN reaction causing immediate rash, facial/tongue/throat swelling, SOB or lightheadedness with hypotension: Unknown Has patient had a PCN reaction causing severe rash involving mucus membranes or skin necrosis: Unknown Has patient had a PCN reaction that required hospitalization: Unknown Has patient had a PCN reaction occurring within the last 10 years: Unknown If all of the above answers are "NO", then may proceed with Cephalosporin use.     VITALS:  Blood pressure 138/72, pulse 60, temperature 100.2 F (37.9 C), temperature source Axillary, resp. rate (!) 22, height 5\' 2"  (1.575 m), weight 84.3 kg (185 lb 13.6 oz), SpO2 100 %.  PHYSICAL EXAMINATION:   Physical Exam  GENERAL:  56 y.o.-year-old patient lying in the bed with no acute distress. Critically ill EYES: Pupils equal, round, reactive to light . No scleral icterus. Extraocular muscles intact.  HEENT: Head atraumatic, normocephalic. Oropharynx and nasopharynx clear. Intubated NECK:  Supple, no jugular venous distention. No thyroid enlargement, no tenderness, NG tube.  LUNGS: Normal breath sounds bilaterally, no wheezing, rales, rhonchi. No use of accessory muscles of respiration.  CARDIOVASCULAR: S1, S2 normal. No murmurs, rubs, or gallops.  ABDOMEN: Soft, nontender, nondistended. Bowel sounds feeble. No organomegaly or mass.  EXTREMITIES: No cyanosis, clubbing or edema b/l.    NEUROLOGIC: Intubated on the  ventilator  PSYCHIATRIC:   intubated on the ventilator , without sedation, calm and follows simple commands. SKIN: No obvious rash, lesion, or ulcer.   LABORATORY PANEL:  CBC  Recent Labs Lab 03/03/17 0445  WBC 19.7*  HGB 8.5*  HCT 24.6*  PLT 191    Chemistries   Recent Labs Lab 03/05/17 0404  03/07/17 0351  NA 145  < > 149*  K 3.9  < > 3.5  CL 111  < > 113*  CO2 27  < > 30  GLUCOSE 117*  < > 111*  BUN 41*  < > 29*  CREATININE 0.54  < > 0.53  CALCIUM 8.1*  < > 7.6*  MG  --   < > 2.2  AST 110*  --   --   ALT 169*  --   --   ALKPHOS 117  --   --   BILITOT 0.6  --   --   < > = values in this interval not displayed. Cardiac Enzymes No results for input(s): TROPONINI in the last 168 hours. RADIOLOGY:  Dg Abd 1 View  Result Date: 03/07/2017 CLINICAL DATA:  NG tube placement. EXAM: ABDOMEN - 1 VIEW COMPARISON:  March 01, 2017. FINDINGS: Interval removal of previously seen nasogastric tube. Minimally prominent air-filled loops of small bowel. Cholecystectomy. IMPRESSION: Interval removal of enteric tube. Minimally prominent air-filled loops of small bowel may reflect ileus. Electronically Signed   By: Obie Dredge M.D.   On: 03/07/2017 12:07   Dg Chest Port 1 View  Result Date: 03/07/2017 CLINICAL DATA:  Evaluate for a nasogastric tube. EXAM: PORTABLE  CHEST 1 VIEW COMPARISON:  03/07/2017 FINDINGS: Nasogastric tube has been pulled back and it is now in the upper chest near the thoracic inlet. Endotracheal tube is roughly 4.1 cm above the carina. There continues to be enlarged interstitial and vascular markings in both lungs. Cardiac silhouette is upper limits of normal and stable. Central line tip in the SVC region. Negative for a pneumothorax. IMPRESSION: Nasogastric tube has been pulled back and located in the upper chest as described. Persistent patchy lung densities suggestive for pulmonary edema and/or airspace disease. Support apparatuses as described. Electronically  Signed   By: Richarda Overlie M.D.   On: 03/07/2017 12:11   Dg Chest Port 1 View  Result Date: 03/07/2017 CLINICAL DATA:  Acute respiratory failure . EXAM: PORTABLE CHEST 1 VIEW COMPARISON:  03/06/2017 . FINDINGS: Endotracheal tube, NG tube, right PICC line stable position. Stable cardiomegaly. Diffuse bilateral pulmonary interstitial prominence consistent CHF again noted. Slight interval clearing. Small left pleural effusion. No pneumothorax . IMPRESSION: 1. Lines and tubes in stable position. 2. Cardiomegaly with diffuse bilateral from interstitial prominence consistent CHF again noted. Slight interval clearing. Small left pleural effusion. Electronically Signed   By: Maisie Fus  Register   On: 03/07/2017 06:17   Dg Chest Port 1 View  Result Date: 03/06/2017 CLINICAL DATA:  Respiratory failure EXAM: PORTABLE CHEST 1 VIEW COMPARISON:  03/05/2017 FINDINGS: Endotracheal tube and NG tube as well as right PICC line remain in place, unchanged. Diffuse bilateral airspace disease, slightly worsened since prior study. Cardiomegaly. IMPRESSION: Worsening diffuse bilateral airspace disease, likely edema/ CHF. Electronically Signed   By: Charlett Nose M.D.   On: 03/06/2017 07:11   Dg Abd Portable 1v  Result Date: 03/07/2017 CLINICAL DATA:  Repositioning of NG tube. EXAM: PORTABLE ABDOMEN - 1 VIEW COMPARISON:  One-view abdomen 03/07/2017 FINDINGS: The side port of the NG tube is now in the antrum of the stomach. By gas pattern is normal. Bibasilar airspace disease remains, left greater than right. Left pleural effusion is present. IMPRESSION: 1. NG tube in place.  The side port is in the antrum of the stomach. 2. Normal bowel gas pattern. 3. Bibasilar airspace disease/lobar pneumonia. Electronically Signed   By: Marin Roberts M.D.   On: 03/07/2017 13:09   ASSESSMENT AND PLAN:   Miranda [name] reportedly had diarrhea over the last 2-3 days for which she was taking up to 12 Imodium per day. She was trying to stay well  hydrated and had also stopped taking her blood pressure medications on account of her GI illness. Around 9 PM, Miranda [name]'s husband found the patient seated on the toilet. She attempted to stand up but passed out.  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 Severely reduced LVEF less than <20%  -repeat echo showed EF of 60-65% -Patient was on IV levophed,Milrinone- improved now. - Was IV propofol -Patient was  on hypothermia protocol -CT chest negative for PE -CXR shows pulmonary edema. Received Lasix - Now very low dose sedation,and waiting to control her agitation, then may do extubation.  *Acute renal failure secondary to #1 -making good urine -Creatinine stable  *Febrile illness -Low-grade fever, tachycardia elevated white count of 40,000--- 19K -Blood cultures so far negative -Empiric IV vancomycin and Zosyn- changed to rocephin -C. Difficile negative -chest x-ray shows diffuse pulmonary infiltrate - Had bronch done 03/07/17- 10 ml cloudy thick secretion suctioned from right Middle lobe. Sent for culture.  *  altered mental status -Patient currently has periods of agitation  -Seen by Dr. Thad Ranger -- neurology -Recommend EEG and brain MRI. - No acute injuries. Taper sedation slowly. - likely due to sedative meds. - more calm and following commands today.  *Metabolic acidosis secondary to #1  *Hyperglycemia -Patient currently off insulin drip  * Nutrition -started on Tube feeding  *DVT prophylaxis subcutaneous heparin  .Discussed with Dr. Belia Heman Patient is critically ill. Family understands.  Case discussed with Care Management/Social Worker. Management plans discussed with the  family and they are in agreement.  CODE STATUS: Full  DVT Prophylaxis: Subcutaneous heparin  TOTAL CRITICAL TIME TAKING CARE OF THIS PATIENT: 30 minutes.  >50% time  spent on counselling and coordination of care Spoke to her husband in room today.  Note: This dictation was prepared with Dragon dictation along with smaller phrase technology. Any transcriptional errors that result from this process are unintentional.  Altamese Dilling M.D on 03/07/2017 at 3:27 PM  Between 7am to 6pm - Pager - [phone]  After 6pm go to [email or web address] - Social research officer, government  [facility]  Office  [phone]  CC: Primary care physician; Merwyn Katos, MD

## 2017-03-07 NOTE — Progress Notes (Signed)
PULMONARY / CRITICAL CARE MEDICINE   Name: Miranda [name] MRN: [id number] DOB: [birth year]    ADMISSION DATE:  02/25/2017   PT PROFILE: 56 F suffered OOH arrest with at least 20 mins ACLS/CPR. Initial identified rhythm was VF. Underwent defib X 3 before ROSC. LHC revealed no CAD, severely reduced LV function and moderately elevated LVEDP. TTM 33 degree protocol initiated.  MAJOR EVENTS/TEST RESULTS: 08/21 CT head: no acute findings 08/21 CTA chest: no PE. Diffuse bilateral opacities 08/22 LHC: no CAD, severely reduced LV function and moderately elevated LVEDP 08/22 Echocardiogram:  08/22 Refractory shock despite norepinephrine and phenylephrine infusions. Epinephrine infusion initiated 08/23 Rewarming initiated. Sinus bradyarrhythmias. Amiodarone DC'd. Milrinone initiated. Improved gas exchange. Off of phenylephrine and norepi. Weaning of of epinephrine 08/25 Pt failed extubation requiring emergent reintubation within minutes, difficult reintubation  08/27 failed weaning trials 08/28 failed wean trials 08/29 MRI no acute process EEG slow process Appreciate neurology consult   INDWELLING DEVICES:: ETT 08/21 >>  L IJ CVL 08/22 >>  R femoral A-line 08/22 >>   MICRO DATA: MRSA PCR 08/22 >>negative  Blood 08/22 >>negative  Urine 08/24>>negative Respiratory 08/24>>staph aureus   ANTIMICROBIALS:  Zosyn 08/24>> 08/28 Vancomycin 08/24>> 08/28  SUBJECTIVE:  Patient remains critically ill On full vent support Lasix as tolerated FiO2 down to 35% Patient failed weaning trials yesterday we will try again today Will stop propofol- On precedex, patient localizes to vocal stimuli    VITAL SIGNS: BP (!) 167/82   Pulse 96   Temp 99.1 F (37.3 C) (Oral)   Resp (!) 38   Ht 5\' 2"  (1.575 m)   Wt 185 lb 13.6 oz (84.3 kg)   SpO2 98%   BMI 33.99 kg/m   HEMODYNAMICS:    VENTILATOR SETTINGS: Vent Mode: PSV FiO2 (%):  [35 %] 35 % Set Rate:  [16 bmp] 16 bmp PEEP:  [5 cmH20] 5  cmH20 Pressure Support:  [25 cmH20-30 cmH20] 25 cmH20 Plateau Pressure:  [32 cmH20] 32 cmH20  INTAKE / OUTPUT: I/O last 3 completed shifts: In: 595.7 [I.V.:475.7; NG/GT:120] Out: 3335 [Urine:2735; Stool:600] PHYSICAL EXAMINATION: General: acutely ill appearing Caucasian female, intubated NAD  Neuro: sedated, withdraws from painful stimulation, PERRL Cardiovascular: irreg, bradycardia with junctional escape beats and PACs, no M/R/G Lungs: rhonchi and crackles throughout, even, non labored; no wheezes or rales  Abdomen: hypoactive BS x4, soft, obese, non distended  Ext: 2+ generalized edema, moves all extremities    LABS:  BMET  Recent Labs Lab 03/05/17 0404 03/06/17 0435 03/06/17 2037 03/07/17 0351  NA 145 147*  --  149*  K 3.9 2.6* 4.6 3.5  CL 111 115*  --  113*  CO2 27 26  --  30  BUN 41* 27*  --  29*  CREATININE 0.54 0.60  --  0.53  GLUCOSE 117* 79  --  111*    Electrolytes  Recent Labs Lab 03/04/17 0500 03/05/17 0404 03/06/17 0435 03/07/17 0351  CALCIUM 7.6* 8.1* 7.5* 7.6*  MG 2.6*  --  1.9 2.2  PHOS 4.5  --  3.0 2.7    CBC  Recent Labs Lab 03/01/17 0606 03/02/17 0741 03/03/17 0445  WBC 26.5* 23.9* 19.7*  HGB 10.5* 8.8* 8.5*  HCT 30.3* 25.7* 24.6*  PLT 266 225 191    Coag's No results for input(s): APTT, INR in the last 168 hours.  Sepsis Markers  Recent Labs Lab 02/28/17 1321  03/01/17 1241  03/03/17 0445 03/04/17 0500 03/05/17 0404  LATICACIDVEN 2.0*  --  0.6  --   --   --   --   PROCALCITON  --   < >  --   < > 0.69 0.42 0.19  < > = values in this interval not displayed.  ABG  Recent Labs Lab 03/03/17 0810 03/04/17 1750 03/06/17 1145  PHART 7.45 7.41 7.48*  PCO2ART 26* 42 37  PO2ART 81* 87 114*    Liver Enzymes  Recent Labs Lab 03/05/17 0404  AST 110*  ALT 169*  ALKPHOS 117  BILITOT 0.6  ALBUMIN 2.3*    Cardiac Enzymes No results for input(s): TROPONINI, PROBNP in the last 168 hours.  Glucose  Recent  Labs Lab 03/05/17 1141 03/05/17 1635 03/06/17 0710 03/06/17 0745 03/06/17 1106 03/06/17 1631  GLUCAP 80 87 58* 145* 72 102*     ASSESSMENT / PLAN: 56 year old white female admitted to the ICU for acute cardiac arrest CPR for approximately 20 minutes status post hypothermia protocol with 1 failed extubation and is now on full vent support and has failed weaning trials over the last several days   PULMONARY A: Acute hypoxemic respiratory failure after cardiac arrest Post reintubation subcutaneous air and possible pneumopericardium without pneumothorax 08/25-resolved P: Full vent support for now Daily SBT if/when meets crtieria   Maintain O2 sats >92% Repeat CXR today  Continue VAP bundle   CARDIOVASCULAR A:  Cardiac Arrest-initial rhythm VF Acute systolic CHF  Bradycardia-resolved Cardiology following appreciate input  Prn levophed and epinephrine drips to maintain map >65 mmHg if needed Milrinone initiated 08/23 Prn Atropine for hr <40 Lasix as tolerated  RENAL A:   AKI-resolved  P:   Monitor BMET intermittently Monitor I/Os Correct electrolytes as indicated  GASTROINTESTINAL A:   Recent diarrheal illness P:   SUP: IV famotidine Continue TF protocol   HEMATOLOGIC A:   Anemia without acute blood loss  P:  DVT px: SQ heparin Monitor CBC intermittently Transfuse for hgb <7  INFECTIOUS Staph pneumonia On IV abx  ENDOCRINE A:   Stress induced hyperglycemia  P:   Continue SSI and lantus CBG's q4hrs  NEUROLOGIC A: Mechanical Intubation Discomfort  P: Maintain RASS goal 0 to -1 Precedex gtt Propofol gtt only if hypoxic, avoid narcotics WUA ongoing Promote family presence at bedside     Critical Care Time devoted to patient care services described in this note is 34 minutes.   Overall, patient is critically ill, prognosis is guarded.  Patient with Multiorgan failure and at high risk for cardiac arrest and death.    Lucie Leather, M.D.   [facility]  Medical Director [facility] Medical Director [facility] Cardio-Pulmonary Department

## 2017-03-07 NOTE — Progress Notes (Signed)
Nutrition Follow-up  DOCUMENTATION CODES:   Obesity unspecified  INTERVENTION:  When tube-feeding is re-initiated recommend Vital High Protein at 56mL/hr x12 hrs, Pro-Stat 79mL BID Regimen provides 880 calories, 102 grams protein, free water  She will be on propofol overnight to help with sleep, rexpected to be restarted at 45-20mcg/kg/min -> previous rate of 27.80mL/hr  Expected to add approximately 327.6 calories from lipids - 1207.6 total calories  Continue liquid MVI w/ minerals daily Monitor electrolytes  NUTRITION DIAGNOSIS:   Inadequate oral intake related to inability to eat (pt ventilated and sedated) as evidenced by NPO status. -ongoing  GOAL:   Provide needs based on ASPEN/SCCM guidelines -not meeting currently  MONITOR:   Vent status, Labs, Weight trends, TF tolerance, I & O's  ASSESSMENT:   56 year old female with PMHx of HTN, migraines, edema to bilateral legs. Patient had diarrhea for 2-3 days PTA and was taking up to 12 imodium per day, staying well-hydrated, and stopped taking antihypertensives. She suffered V-fib arrest at home, was intubated in the field after ROSC.  Patient is currently intubated on ventilator support MV: 12.9 L/min Temp (24hrs), Avg:98.6 F (37 C), Min:97.7 F (36.5 C), Max:99.7 F (37.6 C) Propofol: None currently. Will monitor daily  -Rectal tube 8/28 -RUE PICC -OGT confirmed by Xray today  Family conference today with CCM, Neurology - patient remains DNR, no trach/PEG.  Intake/Output Summary (Last 24 hours) at 03/07/17 1347 Last data filed at 03/07/17 0600  Gross per 24 hour  Intake           346.78 ml  Output             1595 ml  Net         -1248.22 ml   Continues to be diuresed - now 0.3L positive Labs reviewed:  Na 149  Medications reviewed and include:  Solumedrol Precedex gtt  Diet Order:     Skin:  Reviewed, no issues  Last BM:  03/06/2017 - small type 7 per rectal tube  Height:   Ht Readings  from Last 1 Encounters:  03/07/17 5\' 2"  (1.575 m)    Weight:   Wt Readings from Last 1 Encounters:  03/07/17 185 lb 13.6 oz (84.3 kg)    Ideal Body Weight:  50 kg  BMI:  Body mass index is 33.99 kg/m.  Estimated Nutritional Needs:   Kcal:  330-0762 (11-14 kcal/kg)  Protein:  >/= 100 grams (>/= 2 grams/kg IBW)  Fluid:  1.5-1.75 L/day (30-35 ml/kg IBW)  EDUCATION NEEDS:   No education needs identified at this time  Dionne Ano. Leahmarie Gasiorowski, MS, RD LDN Inpatient Clinical Dietitian Pager [phone]

## 2017-03-07 NOTE — Plan of Care (Signed)
Problem: Pain Managment: Goal: General experience of comfort will improve Outcome: Not Progressing Pt does not have pain medication available per husband's request  Problem: Skin Integrity: Goal: Risk for impaired skin integrity will decrease Outcome: Progressing Pt turned every 2 hours and prn.  Problem: Activity: Goal: Risk for activity intolerance will decrease Outcome: Not Progressing Pt on bedrest  Problem: Fluid Volume: Goal: Ability to maintain a balanced intake and output will improve Outcome: Progressing Pt received lasix for fluid overload  Problem: Nutrition: Goal: Adequate nutrition will be maintained Outcome: Not Progressing Pt is NPO > 48 hours  Problem: Bowel/Gastric: Goal: Will not experience complications related to bowel motility Outcome: Not Progressing Pt has active diarrhea with a flexiseal  Problem: Coping: Goal: Level of anxiety will decrease Outcome: Not Progressing Pt on precedex gtt, continues to be agitated.

## 2017-03-08 ENCOUNTER — Inpatient Hospital Stay: Payer: BC Managed Care – PPO

## 2017-03-08 LAB — GLUCOSE, CAPILLARY
GLUCOSE-CAPILLARY: 226 mg/dL — AB (ref 65–99)
GLUCOSE-CAPILLARY: 212 mg/dL — AB (ref 65–99)
GLUCOSE-CAPILLARY: 193 mg/dL — AB (ref 65–99)
GLUCOSE-CAPILLARY: 158 mg/dL — AB (ref 65–99)
Glucose-Capillary: 206 mg/dL — ABNORMAL HIGH (ref 65–99)
Glucose-Capillary: 161 mg/dL — ABNORMAL HIGH (ref 65–99)
Glucose-Capillary: 132 mg/dL — ABNORMAL HIGH (ref 65–99)
Glucose-Capillary: 156 mg/dL — ABNORMAL HIGH (ref 65–99)
Glucose-Capillary: 158 mg/dL — ABNORMAL HIGH (ref 65–99)

## 2017-03-08 LAB — BASIC METABOLIC PANEL
Anion gap: 6 (ref 5–15)
Anion gap: 8 (ref 5–15)
BUN: 43 mg/dL — AB (ref 6–20)
BUN: 36 mg/dL — AB (ref 6–20)
CALCIUM: 8.7 mg/dL — AB (ref 8.9–10.3)
CHLORIDE: 119 mmol/L — AB (ref 101–111)
CO2: 27 mmol/L (ref 22–32)
CO2: 26 mmol/L (ref 22–32)
CREATININE: 0.67 mg/dL (ref 0.44–1.00)
CREATININE: 0.56 mg/dL (ref 0.44–1.00)
Calcium: 8.4 mg/dL — ABNORMAL LOW (ref 8.9–10.3)
Chloride: 115 mmol/L — ABNORMAL HIGH (ref 101–111)
GFR calc Af Amer: >60 mL/min (ref >60)
GFR calc non Af Amer: >60 mL/min (ref >60)
GFR calc non Af Amer: >60 mL/min (ref >60)
Glucose, Bld: 237 mg/dL — ABNORMAL HIGH (ref 65–99)
Glucose, Bld: 196 mg/dL — ABNORMAL HIGH (ref 65–99)
POTASSIUM: 5 mmol/L (ref 3.5–5.1)
Potassium: 3.9 mmol/L (ref 3.5–5.1)
SODIUM: 149 mmol/L — AB (ref 135–145)
Sodium: 152 mmol/L — ABNORMAL HIGH (ref 135–145)

## 2017-03-08 LAB — CBC
HEMATOCRIT: 26.4 % — AB (ref 35.0–47.0)
Hemoglobin: 9 g/dL — ABNORMAL LOW (ref 12.0–16.0)
MCH: 30.9 pg (ref 26.0–34.0)
MCHC: 34.1 g/dL (ref 32.0–36.0)
MCV: 90.7 fL (ref 80.0–100.0)
PLATELETS: 226 10*3/uL (ref 150–440)
RBC: 2.91 MIL/uL — AB (ref 3.80–5.20)
RDW: 13.5 % (ref 11.5–14.5)
WBC: 7 10*3/uL (ref 3.6–11.0)

## 2017-03-08 LAB — MAGNESIUM
MAGNESIUM: 2.3 mg/dL (ref 1.7–2.4)
Magnesium: 2 mg/dL (ref 1.7–2.4)

## 2017-03-08 MED ORDER — DEXTROSE 5 % IV SOLN
INTRAVENOUS | Status: DC
  Administered 2017-03-08 – 2017-03-10 (×4): via INTRAVENOUS

## 2017-03-08 MED ORDER — SODIUM CHLORIDE 0.9 % IV SOLN: INTRAVENOUS | Status: DC

## 2017-03-08 MED ORDER — FREE WATER
200.0000 mL | Freq: Three times a day (TID) | Status: DC
  Administered 2017-03-08 – 2017-03-11 (×9): 200 mL

## 2017-03-08 MED ORDER — INSULIN ASPART 100 UNIT/ML ~~LOC~~ SOLN
2.0000 [IU] | SUBCUTANEOUS | Status: DC
  Administered 2017-03-08: 6 [IU] via SUBCUTANEOUS
  Filled 2017-03-08: qty 1

## 2017-03-08 MED ORDER — SODIUM CHLORIDE 0.9 % IV SOLN
INTRAVENOUS | Status: DC
  Administered 2017-03-08: 1.7 [IU]/h via INTRAVENOUS
  Filled 2017-03-08: qty 1

## 2017-03-08 NOTE — Progress Notes (Signed)
[facility] - [place] at [facility]   PATIENT NAME: Miranda [name]    MR#:  [id number]  DATE OF BIRTH:  Mar 25, 1961  SUBJECTIVE:   Intubated,sedated and on the vent .  tapered sedation,  More calm and follows simple commands today. Respond by noding her head and facial expressions.  REVIEW OF SYSTEMS:   Review of Systems  Unable to perform ROS: Intubated   DRUG ALLERGIES:   Allergies  Allergen Reactions  . Ace Inhibitors   . Compazine [Prochlorperazine Edisylate]   . Lisinopril   . Penicillins     Has patient had a PCN reaction causing immediate rash, facial/tongue/throat swelling, SOB or lightheadedness with hypotension: Unknown Has patient had a PCN reaction causing severe rash involving mucus membranes or skin necrosis: Unknown Has patient had a PCN reaction that required hospitalization: Unknown Has patient had a PCN reaction occurring within the last 10 years: Unknown If all of the above answers are "NO", then may proceed with Cephalosporin use.     VITALS:  Blood pressure (!) 132/55, pulse 65, temperature 100.1 F (37.8 C), temperature source Oral, resp. rate (!) 23, height 5\' 2"  (1.575 m), weight 85.1 kg (187 lb 9.8 oz), SpO2 94 %.  PHYSICAL EXAMINATION:   Physical Exam  GENERAL:  56 y.o.-year-old patient lying in the bed with no acute distress.  EYES: Pupils equal, round, reactive to light . No scleral icterus. Extraocular muscles intact.  HEENT: Head atraumatic, normocephalic. Oropharynx and nasopharynx clear. Intubated NECK:  Supple, no jugular venous distention. No thyroid enlargement, no tenderness, NG tube.  LUNGS: Normal breath sounds bilaterally, no wheezing, rales, rhonchi. No use of accessory muscles of respiration.  CARDIOVASCULAR: S1, S2 normal. No murmurs, rubs, or gallops.  ABDOMEN: Soft, nontender, nondistended. Bowel sounds feeble. No organomegaly or mass.  EXTREMITIES: No cyanosis, clubbing or edema b/l.    NEUROLOGIC:  Intubated on the ventilator , but looks and respond by noding her head. Moves limbs to simple commands. PSYCHIATRIC:   intubated on the ventilator , without sedation, calm and follows simple commands. SKIN: No obvious rash, lesion, or ulcer.   LABORATORY PANEL:  CBC  Recent Labs Lab 03/08/17 0539  WBC 7.0  HGB 9.0*  HCT 26.4*  PLT 226    Chemistries   Recent Labs Lab 03/05/17 0404  03/08/17 0539  NA 145  < > 152*  K 3.9  < > 5.0  CL 111  < > 119*  CO2 27  < > 27  GLUCOSE 117*  < > 237*  BUN 41*  < > 43*  CREATININE 0.54  < > 0.67  CALCIUM 8.1*  < > 8.4*  MG  --   < > 2.3  AST 110*  --   --   ALT 169*  --   --   ALKPHOS 117  --   --   BILITOT 0.6  --   --   < > = values in this interval not displayed. Cardiac Enzymes No results for input(s): TROPONINI in the last 168 hours. RADIOLOGY:  Dg Abd 1 View  Result Date: 03/07/2017 CLINICAL DATA:  NG tube placement. EXAM: ABDOMEN - 1 VIEW COMPARISON:  March 01, 2017. FINDINGS: Interval removal of previously seen nasogastric tube. Minimally prominent air-filled loops of small bowel. Cholecystectomy. IMPRESSION: Interval removal of enteric tube. Minimally prominent air-filled loops of small bowel may reflect ileus. Electronically Signed   By: Obie Dredge M.D.   On: 03/07/2017 12:07   Dg  Chest Port 1 View  Result Date: 03/07/2017 CLINICAL DATA:  Evaluate for a nasogastric tube. EXAM: PORTABLE CHEST 1 VIEW COMPARISON:  03/07/2017 FINDINGS: Nasogastric tube has been pulled back and it is now in the upper chest near the thoracic inlet. Endotracheal tube is roughly 4.1 cm above the carina. There continues to be enlarged interstitial and vascular markings in both lungs. Cardiac silhouette is upper limits of normal and stable. Central line tip in the SVC region. Negative for a pneumothorax. IMPRESSION: Nasogastric tube has been pulled back and located in the upper chest as described. Persistent patchy lung densities suggestive for  pulmonary edema and/or airspace disease. Support apparatuses as described. Electronically Signed   By: Richarda Overlie M.D.   On: 03/07/2017 12:11   Dg Chest Port 1 View  Result Date: 03/07/2017 CLINICAL DATA:  Acute respiratory failure . EXAM: PORTABLE CHEST 1 VIEW COMPARISON:  03/06/2017 . FINDINGS: Endotracheal tube, NG tube, right PICC line stable position. Stable cardiomegaly. Diffuse bilateral pulmonary interstitial prominence consistent CHF again noted. Slight interval clearing. Small left pleural effusion. No pneumothorax . IMPRESSION: 1. Lines and tubes in stable position. 2. Cardiomegaly with diffuse bilateral from interstitial prominence consistent CHF again noted. Slight interval clearing. Small left pleural effusion. Electronically Signed   By: Maisie Fus  Register   On: 03/07/2017 06:17   Dg Abd Portable 1v  Result Date: 03/07/2017 CLINICAL DATA:  Repositioning of NG tube. EXAM: PORTABLE ABDOMEN - 1 VIEW COMPARISON:  One-view abdomen 03/07/2017 FINDINGS: The side port of the NG tube is now in the antrum of the stomach. By gas pattern is normal. Bibasilar airspace disease remains, left greater than right. Left pleural effusion is present. IMPRESSION: 1. NG tube in place.  The side port is in the antrum of the stomach. 2. Normal bowel gas pattern. 3. Bibasilar airspace disease/lobar pneumonia. Electronically Signed   By: Marin Roberts M.D.   On: 03/07/2017 13:09   ASSESSMENT AND PLAN:   Ms. Jungwirth reportedly had diarrhea over the last 2-3 days for which she was taking up to 12 Imodium per day. She was trying to stay well hydrated and had also stopped taking her blood pressure medications on account of her GI illness. Around 9 PM, Ms. Letarte's husband found the patient seated on the toilet. She attempted to stand up but passed out.  * Cardiac arrest due to ventricular fibrillation with severe cardiomyopathy -Patient underwent urgent Catheterization  showed no significant coronary artery  disease. Severely reduced LVEF less than <20%  -repeat echo showed EF of 60-65% -Patient was on IV levophed,Milrinone- improved now. - Was IV propofol -Patient was  on hypothermia protocol -CT chest negative for PE -CXR shows pulmonary edema. Received Lasix - Now very low dose sedation,and waiting to control her agitation, then may do extubation , when allowed by ICU team.  *Acute renal failure secondary to #1 -making good urine -Creatinine stable  *Febrile illness -Low-grade fever, tachycardia elevated white count of 40,000--- 19K -Blood cultures so far negative -Empiric IV vancomycin and Zosyn- changed to rocephin -C. Difficile negative -chest x-ray shows diffuse pulmonary infiltrate - Had bronch done 03/07/17- 10 ml cloudy thick secretion suctioned from right Middle lobe. Sent for culture.  *altered mental status -Patient currently has periods of agitation  -Seen by Dr. Thad Ranger -- neurology -Recommend EEG and brain MRI. - No acute injuries. Taper sedation slowly. - likely due to sedative meds. - more calm and following commands today.  *Metabolic acidosis secondary to #1  *Hyperglycemia -  Patient currently off insulin drip  * Nutrition -started on Tube feeding  *DVT prophylaxis subcutaneous heparin  . Case discussed with Care Management/Social Worker. Management plans discussed with the  family and they are in agreement.  CODE STATUS: Full  DVT Prophylaxis: Subcutaneous heparin  TOTAL CRITICAL TIME TAKING CARE OF THIS PATIENT: 30 minutes.  >50% time spent on counselling and coordination of care Spoke to her daughter and sister in room today.  Note: This dictation was prepared with Dragon dictation along with smaller phrase technology. Any transcriptional errors that result from this process are unintentional.  Altamese Dilling M.D on 03/08/2017 at 1:44 PM  Between 7am to 6pm - Pager - [phone]  After 6pm go to [email or web address] - Air traffic controller  [facility]  Office  [phone]  CC: Primary care physician; Merwyn Katos, MD

## 2017-03-08 NOTE — Progress Notes (Addendum)
MEDICATION RELATED CONSULT NOTE  Pharmacy Consult for Electrolyte Monitoring Indication: Hypomagnesemia  Allergies  Allergen Reactions  . Ace Inhibitors   . Compazine [Prochlorperazine Edisylate]   . Lisinopril   . Penicillins     Has patient had a PCN reaction causing immediate rash, facial/tongue/throat swelling, SOB or lightheadedness with hypotension: Unknown Has patient had a PCN reaction causing severe rash involving mucus membranes or skin necrosis: Unknown Has patient had a PCN reaction that required hospitalization: Unknown Has patient had a PCN reaction occurring within the last 10 years: Unknown If all of the above answers are "NO", then may proceed with Cephalosporin use.     Patient Measurements: Height: 5\' 2"  (157.5 cm) Weight: 187 lb 9.8 oz (85.1 kg) IBW/kg (Calculated) : 50.1  Vital Signs: Temp: 99.6 F (37.6 C) (09/01 0800) Temp Source: Oral (09/01 0800) BP: 147/76 (09/01 0900) Pulse Rate: 71 (09/01 0900) Intake/Output from previous day: 08/31 0701 - 09/01 0700 In: 634.2 [I.V.:274.2; NG/GT:360] Out: 2600 [Urine:2500; Stool:100] Intake/Output from this shift: Total I/O In: 389.7 [I.V.:239.7; NG/GT:150] Out: 530 [Urine:260; Stool:270]  Labs:  Recent Labs  03/06/17 0435 03/07/17 0351 03/08/17 0539  WBC  --   --  7.0  HGB  --   --  9.0*  HCT  --   --  26.4*  PLT  --   --  226  CREATININE 0.60 0.53 0.67  MG 1.9 2.2  --   PHOS 3.0 2.7  --    Estimated Creatinine Clearance: 77.6 mL/min (by C-G formula based on SCr of 0.67 mg/dL).  Sodium (mmol/L)  Date Value  03/08/2017 152 (H)  08/16/2016 141   Potassium (mmol/L)  Date Value  03/08/2017 5.0   Calcium (mg/dL)  Date Value  83/15/1761 8.4 (L)   Albumin (g/dL)  Date Value  60/73/7106 2.3 (L)    Assessment: 56 y/o F s/p cardiac arrest and hypothermia protocol with hypomagnesemia. Due to high risk of recurrent arrhythmia, cardiology wants a goal K of > or = 4 and Mg > or = 2.   Plan:   Discontinue scheduled potasium replacement.   Patient with hypernatremia; not currently on IV maintenance fluids and receiving tube feeds. Will defer to CCM medicine.   Patient's BG elevated on tube feeds; will reinitiate q4hr coverage.   Pharmacy will continue to monitor and adjust per consult.   Simpson,Michael L 03/08/2017,9:59 AM

## 2017-03-08 NOTE — Progress Notes (Addendum)
ELink and Dr Eden Emms notified of runs VTach, one relabeled to Vfib by CCMD?  Per Dr Eden Emms please call Elink wi/ inappropriate K or Mag results, and call his cell phone [phone]) if patient has any VTach runs of >20 beats.  Reluctant to start amiodarone unless necessary d/t bradycardia

## 2017-03-08 NOTE — Progress Notes (Signed)
Patient Name: Miranda [name] Date of Encounter: 03/08/2017  Primary Cardiologist: Dr Saunders Revel   Hospital Problem List     Principal Problem:   Cardiac arrest with ventricular fibrillation [facility]) Active Problems:   Cardiac arrest ([place])   Acute respiratory failure ([place])   Hypokalemia   Encounter for central line placement   Cardiogenic shock ([place])   Acute pulmonary edema ([facility])   Anoxic encephalopathy ([place])   Bradycardia     Subjective   Intubated but much more alert and responsive   Inpatient Medications    Scheduled Meds: . budesonide (PULMICORT) nebulizer solution  0.5 mg Nebulization BID  . chlorhexidine gluconate (MEDLINE KIT)  15 mL Mouth Rinse BID  . famotidine  20 mg Per Tube BID  . feeding supplement (PRO-STAT SUGAR FREE 64)  60 mL Per Tube BID  . feeding supplement (VITAL HIGH PROTEIN)  1,000 mL Per Tube Q24H  . free water  200 mL Per Tube Q8H  . heparin  5,000 Units Subcutaneous Q8H  . insulin aspart  2-6 Units Subcutaneous Q4H  . ipratropium-albuterol  3 mL Nebulization Q6H  . mouth rinse  15 mL Mouth Rinse 10 times per day  . methylPREDNISolone (SOLU-MEDROL) injection  20 mg Intravenous Q12H  . multivitamin  15 mL Per Tube Daily  . oxyCODONE  2.5 mg Oral TID  . QUEtiapine  50 mg Per Tube QHS  . sodium chloride flush  10-40 mL Intracatheter Q12H   Continuous Infusions: . dexmedetomidine (PRECEDEX) IV infusion 0.4 mcg/kg/hr (03/08/17 0900)  . dextrose    . propofol (DIPRIVAN) infusion Stopped (03/08/17 0801)   PRN Meds: acetaminophen, atropine, sennosides **AND** docusate, levalbuterol, naLOXone (NARCAN)  injection, pneumococcal 23 valent vaccine, sodium chloride flush, vecuronium   Vital Signs    Vitals:   03/08/17 0800 03/08/17 0816 03/08/17 0900 03/08/17 1000  BP: (!) 126/59  (!) 147/76 (!) 147/60  Pulse: (!) 55  71 74  Resp: 18  18 (!) 21  Temp: 99.6 F (37.6 C)     TempSrc: Oral     SpO2: 98% 99% 100% 98%  Weight:      Height:         Intake/Output Summary (Last 24 hours) at 03/08/17 1050 Last data filed at 03/08/17 0900  Gross per 24 hour  Intake           1035.8 ml  Output             3130 ml  Net          -2094.2 ml   Filed Weights   03/06/17 0430 03/07/17 0400 03/08/17 0622  Weight: 203 lb 11.3 oz (92.4 kg) 185 lb 13.6 oz (84.3 kg) 187 lb 9.8 oz (85.1 kg)    Physical Exam    Affect appropriate Healthy:  appears stated age HEENT: intubated  Neck supple with no adenopathy JVP normal no bruits no thyromegaly Lungs clear with no wheezing and good diaphragmatic motion Heart:  S1/S2 no murmur, no rub, gallop or click PMI normal Abdomen: benighn, BS positve, no tenderness, no AAA no bruit.  No HSM or HJR Distal pulses intact with no bruits No edema Neuro non-focal Skin warm and dry No muscular weakness   Labs    CBC  Recent Labs  03/08/17 0539  WBC 7.0  HGB 9.0*  HCT 26.4*  MCV 90.7  PLT 939   Basic Metabolic Panel  Recent Labs  03/06/17 0435  03/07/17 0351 03/08/17 0539  NA 147*  --  149* 152*  K 2.6*  < > 3.5 5.0  CL 115*  --  113* 119*  CO2 26  --  30 27  GLUCOSE 79  --  111* 237*  BUN 27*  --  29* 43*  CREATININE 0.60  --  0.53 0.67  CALCIUM 7.5*  --  7.6* 8.4*  MG 1.9  --  2.2  --   PHOS 3.0  --  2.7  --   < > = values in this interval not displayed. Fasting Lipid Panel  Recent Labs  03/07/17 1933  TRIG 262*   Thyroid Function Tests No results for input(s): TSH, T4TOTAL, T3FREE, THYROIDAB in the last 72 hours.  Invalid input(s): FREET3  Telemetry    NSR rates 70-80  - Personally Reviewed  ECG    NSR no acute changes normal QT   Radiology    Dg Abd 1 View  Result Date: 03/07/2017 CLINICAL DATA:  NG tube placement. EXAM: ABDOMEN - 1 VIEW COMPARISON:  March 01, 2017. FINDINGS: Interval removal of previously seen nasogastric tube. Minimally prominent air-filled loops of small bowel. Cholecystectomy. IMPRESSION: Interval removal of enteric tube. Minimally  prominent air-filled loops of small bowel may reflect ileus. Electronically Signed   By: Titus Dubin M.D.   On: 03/07/2017 12:07   Dg Chest Port 1 View  Result Date: 03/07/2017 CLINICAL DATA:  Evaluate for a nasogastric tube. EXAM: PORTABLE CHEST 1 VIEW COMPARISON:  03/07/2017 FINDINGS: Nasogastric tube has been pulled back and it is now in the upper chest near the thoracic inlet. Endotracheal tube is roughly 4.1 cm above the carina. There continues to be enlarged interstitial and vascular markings in both lungs. Cardiac silhouette is upper limits of normal and stable. Central line tip in the SVC region. Negative for a pneumothorax. IMPRESSION: Nasogastric tube has been pulled back and located in the upper chest as described. Persistent patchy lung densities suggestive for pulmonary edema and/or airspace disease. Support apparatuses as described. Electronically Signed   By: Markus Daft M.D.   On: 03/07/2017 12:11   Dg Chest Port 1 View  Result Date: 03/07/2017 CLINICAL DATA:  Acute respiratory failure . EXAM: PORTABLE CHEST 1 VIEW COMPARISON:  03/06/2017 . FINDINGS: Endotracheal tube, NG tube, right PICC line stable position. Stable cardiomegaly. Diffuse bilateral pulmonary interstitial prominence consistent CHF again noted. Slight interval clearing. Small left pleural effusion. No pneumothorax . IMPRESSION: 1. Lines and tubes in stable position. 2. Cardiomegaly with diffuse bilateral from interstitial prominence consistent CHF again noted. Slight interval clearing. Small left pleural effusion. Electronically Signed   By: Marcello Moores  Register   On: 03/07/2017 06:17   Dg Abd Portable 1v  Result Date: 03/07/2017 CLINICAL DATA:  Repositioning of NG tube. EXAM: PORTABLE ABDOMEN - 1 VIEW COMPARISON:  One-view abdomen 03/07/2017 FINDINGS: The side port of the NG tube is now in the antrum of the stomach. By gas pattern is normal. Bibasilar airspace disease remains, left greater than right. Left pleural effusion  is present. IMPRESSION: 1. NG tube in place.  The side port is in the antrum of the stomach. 2. Normal bowel gas pattern. 3. Bibasilar airspace disease/lobar pneumonia. Electronically Signed   By: San Morelle M.D.   On: 03/07/2017 13:09    Cardiac Studies   TTE 03/03/2017: Study Conclusions  - Left ventricle: The cavity size was normal. There was mild concentric hypertrophy. Systolic function was normal. The estimated ejection fraction was in the range of 55% to 60%. Wall motion was normal;  there were no regional wall motion abnormalities. - Aortic valve: There was mild regurgitation. - Pulmonary arteries: Systolic pressure was mildly to moderately increased. PA peak pressure: 46 mm Hg (S).  Impressions:  - Limited echo to evaluate ejection fraction. When compared to recent echo, LV systolic function normalized completely with improvement in ejection fraction of from 10% to 55%.  LHC 02/26/2017: Conclusion   Conclusions: 1. No angiographically significant coronary artery disease. 2. Severely reduced left ventricular contraction (<25%) with moderately elevated left ventricular filling pressure (LVEDP 28 mmHg).  Recommendations: 1. Hypothermia protocol per hospitalist and ICU teams. 2. Check magnesium level; replete electrolytes for goal potassium and magnesium greater than 4.0 and 2.0, respectively. 3. Consider gentle diuresis, given elevated LVEDP and severely reduced LVEF. 4. Obtain transthoracic echocardiogram    TTE 02/26/2017: Study Conclusions  - Left ventricle: The cavity size was mildly dilated. Systolic function was severely reduced. The estimated ejection fraction was <20%. Diffuse hypokinesis. Regional wall motion abnormalities cannot be excluded. The study is not technically sufficient to allow evaluation of LV diastolic function. - Mitral valve: There was mild regurgitation. - Right ventricle: Systolic function was  mildly reduced. - Pulmonary arteries: Systolic pressure could not be accurately estimated. - Inferior vena cava: The vessel was normal in size. The respirophasic diameter changes were in the normal range (>= 50%), consistent with normal central venous pressure.   Patient Profile     56 y.o. female with history of HTN, migraine disorder, and dependent edema who has had diarrhea for the past 2-3 days, taking up to 12 Imodium daily presented to [facility] with VF arrest. Cardiac cath showed no significant CAD.  Assessment & Plan    1. VF arrest: -Likely due to electrolyte abnormalities. Continue supportive care. EF improved to normal on subsequent echo. No plans for AICD per husband who is also a nurse    2. Acute hypoxic respiratory failure: - She remains intubated previous premature extubation per husband He indicates no  Plans for extubation until Tuesday   3. Acute systolic CHF:  Resolved azotemic on labs lasix held   4. Cardiogenic shock: -Resolved  5. AKI: - Resolved  6. Mental status: much improved responsive and interactive MRI no structural issues and EEG ok so prognosis seems a lot better at this time  Jenkins Rouge

## 2017-03-08 NOTE — Progress Notes (Signed)
eLink Physician-Brief Progress Note Patient Name: Miranda [name] DOB: February 07, 1961 MRN: [id number]   Date of Service  03/08/2017  HPI/Events of Note  ET tube position called into question. Patient awake. Camera shows husband and nurse at bedside. Ventilator shows appropriate inspiratory and expiratory tidal volumes. Peak pressure approximately 31. Patient interactive and nodding appropriately. Reportedly patient requiring too much ventilator support to attempt spontaneous breathing trial/extubation today.   eICU Interventions  Checking stat portable chest x-ray to confirm tube placement.      Intervention Category Major Interventions: Respiratory failure - evaluation and management  Lawanda Cousins 03/08/2017, 5:39 PM

## 2017-03-08 NOTE — Progress Notes (Addendum)
ETT tube appears migrated out to 20 cm, ELink notified, MD will order portable CXR to confirm placement.  OG tube appears to be migrated out to 65 cm also

## 2017-03-08 NOTE — Progress Notes (Signed)
Subjective: Precedex and propofol is off, she is wide awake, she is responsive, and laughing at her jokes. Significant improvement, family at bedside. Patient following most simple commands. Will not move arms on command however she does lift them both antigravity with prompting from daughter.  Objective: Current vital signs: BP (!) 147/60   Pulse 74   Temp 99.6 F (37.6 C) (Oral)   Resp (!) 21   Ht _0  (1.575 m)   Wt 187 lb 9.8 oz (85.1 kg)   SpO2 98%   BMI 34.31 kg/m  Vital signs in last 24 hours: Temp:  [97.7 F (36.5 C)-101.1 F (38.4 C)] 99.6 F (37.6 C) (09/01 0800) Pulse Rate:  [47-76] 74 (09/01 1000) Resp:  [16-28] 21 (09/01 1000) BP: (95-150)/(42-84) 147/60 (09/01 1000) SpO2:  [96 %-100 %] 98 % (09/01 1000) FiO2 (%):  [35 %-45 %] 40 % (09/01 0816) Weight:  [187 lb 9.8 oz (85.1 kg)] 187 lb 9.8 oz (85.1 kg) (09/01 0622)  Intake/Output from previous day: 08/31 0701 - 09/01 0700 In: 634.2 [I.V.:274.2; NG/GT:360] Out: 2600 [Urine:2500; Stool:100] Intake/Output this shift: Total I/O In: 484.7 [I.V.:239.7; NG/GT:245] Out: 530 [Urine:260; Stool:270] Nutritional status:    Neuro: Alert, laughing to jokes, nodding yes/no, follows most simple commands Cranial Nerves:    The pupils are equal, round, and reactive to light. Visual fields are full to threat. Extraocular movements are intact. Trigeminal sensation is intact and the muscles of mastication are normal. The face appears symmetric. Hearing intact to voice.  Shoulder shrug is normal. The tongue appears midline Motor Observation:    No asymmetry, no atrophy, and no involuntary movements noted. Tone:    Normal muscle tone.    Posture:    Posture is normal in bed, sitting up    Strength:    Moving legs spontaneously and briskly. Can lift both arms anti-gravity if prompted by daughter; not responding to commands to move arms, left arm appears weaker however moves arms equally and anti-gravity when prompted, may  still be cognitive in etiology as opposed to weakness.       Sensation: intact to LT     Reflex Exam:  DTR's:    Deep tendon reflexes in the upper and lower extremities are symmetrical bilaterally.    Lab Results: Basic Metabolic Panel:  Recent Labs Lab 03/03/17 0445 03/04/17 0500 03/05/17 0404 03/06/17 0435 03/06/17 2037 03/07/17 0351 03/08/17 0539  NA 136 137 145 147*  --  149* 152*  K 4.1 4.5 3.9 2.6* 4.6 3.5 5.0  CL 108 105 111 115*  --  113* 119*  CO2 _1 --  30 27  GLUCOSE 149* 138* 117* 79  --  111* 237*  BUN 30* 36* 41* 27*  --  29* 43*  CREATININE 0.72 0.67 0.54 0.60  --  0.53 0.67  CALCIUM 8.1* 7.6* 8.1* 7.5*  --  7.6* 8.4*  MG 1.9 2.6*  --  1.9  --  2.2 2.3  PHOS  --  4.5  --  3.0  --  2.7  --     Liver Function Tests:  Recent Labs Lab 03/05/17 0404  AST 110*  ALT 169*  ALKPHOS 117  BILITOT 0.6  PROT 6.6  ALBUMIN 2.3*   No results for input(s): LIPASE, AMYLASE in the last 168 hours.  Recent Labs Lab 03/05/17 0404  AMMONIA 44*    CBC:  Recent Labs Lab 03/02/17 0741 03/03/17 0445 03/08/17 0539  WBC 23.9* 19.7*  7.0  NEUTROABS 22.4* 17.9*  --   HGB 8.8* 8.5* 9.0*  HCT 25.7* 24.6* 26.4*  MCV 88.4 89.8 90.7  PLT 225 191 226    Cardiac Enzymes: No results for input(s): CKTOTAL, CKMB, CKMBINDEX, TROPONINI in the last 168 hours.  Lipid Panel:  Recent Labs Lab 03/02/17 2059 03/06/17 0435 03/07/17 1933  TRIG 101 329* 262*    CBG:  Recent Labs Lab 03/05/17 1635 03/06/17 0710 03/06/17 0745 03/06/17 1106 03/06/17 1631  GLUCAP 87 21* 145* 60 102*    Microbiology: Results for orders placed or performed during the hospital encounter of 02/25/17  MRSA PCR Screening     Status: None   Collection Time: 02/26/17  1:36 AM  Result Value Ref Range Status   MRSA by PCR NEGATIVE NEGATIVE Final    Comment:        The GeneXpert MRSA Assay (FDA approved for NASAL specimens only), is one component of a comprehensive MRSA  colonization surveillance program. It is not intended to diagnose MRSA infection nor to guide or monitor treatment for MRSA infections.   Blood culture (routine x 2)     Status: None   Collection Time: 02/26/17  2:08 AM  Result Value Ref Range Status   Specimen Description BLOOD LEFT WRIST  Final   Special Requests   Final    BOTTLES DRAWN AEROBIC AND ANAEROBIC Blood Culture adequate volume   Culture NO GROWTH 5 DAYS  Final   Report Status 03/03/2017 FINAL  Final  Blood culture (routine x 2)     Status: None   Collection Time: 02/26/17  3:40 AM  Result Value Ref Range Status   Specimen Description BLOOD PORT  Final   Special Requests   Final    BOTTLES DRAWN AEROBIC AND ANAEROBIC Blood Culture adequate volume   Culture NO GROWTH 5 DAYS  Final   Report Status 03/03/2017 FINAL  Final  Urine Culture     Status: None   Collection Time: 02/28/17 10:18 AM  Result Value Ref Range Status   Specimen Description URINE, CATHETERIZED  Final   Special Requests Normal  Final   Culture   Final    NO GROWTH Performed at [facility], [place] [address], [place], [place] [address]    Report Status 03/01/2017 FINAL  Final  Culture, respiratory (NON-Expectorated)     Status: None   Collection Time: 02/28/17 10:45 AM  Result Value Ref Range Status   Specimen Description TRACHEAL ASPIRATE  Final   Special Requests NONE  Final   Gram Stain   Final    ABUNDANT WBC PRESENT,BOTH PMN AND MONONUCLEAR FEW SQUAMOUS EPITHELIAL CELLS PRESENT MODERATE GRAM POSITIVE COCCI FEW GRAM NEGATIVE RODS RARE GRAM POSITIVE RODS RARE GRAM NEGATIVE COCCI Performed at [facility], [place] [address] [place], [place] [address]    Culture ABUNDANT STAPHYLOCOCCUS AUREUS  Final   Report Status 03/02/2017 FINAL  Final   Organism ID, Bacteria STAPHYLOCOCCUS AUREUS  Final      Susceptibility   Staphylococcus aureus - MIC*    CIPROFLOXACIN <=0.5 SENSITIVE Sensitive     ERYTHROMYCIN <=0.25 SENSITIVE Sensitive      GENTAMICIN <=0.5 SENSITIVE Sensitive     OXACILLIN <=0.25 SENSITIVE Sensitive     TETRACYCLINE <=1 SENSITIVE Sensitive     VANCOMYCIN <=0.5 SENSITIVE Sensitive     TRIMETH/SULFA <=10 SENSITIVE Sensitive     CLINDAMYCIN <=0.25 SENSITIVE Sensitive     RIFAMPIN <=0.5 SENSITIVE Sensitive  Inducible Clindamycin NEGATIVE Sensitive     * ABUNDANT STAPHYLOCOCCUS AUREUS  C difficile quick scan w PCR reflex     Status: None   Collection Time: 03/04/17 11:40 AM  Result Value Ref Range Status   C Diff antigen NEGATIVE NEGATIVE Final   C Diff toxin NEGATIVE NEGATIVE Final   C Diff interpretation No C. difficile detected.  Final  Culture, bal-quantitative     Status: None (Preliminary result)   Collection Time: 03/07/17 12:37 PM  Result Value Ref Range Status   Specimen Description TRACHEAL ASPIRATE  Final   Special Requests Normal  Final   Gram Stain   Final    ABUNDANT WBC PRESENT, PREDOMINANTLY PMN NO ORGANISMS SEEN Performed at [facility], [address], [place], [place] [address]    Culture PENDING  Incomplete   Report Status PENDING  Incomplete    Coagulation Studies: No results for input(s): LABPROT, INR in the last 72 hours.  Imaging: Dg Abd 1 View  Result Date: 03/07/2017 CLINICAL DATA:  NG tube placement. EXAM: ABDOMEN - 1 VIEW COMPARISON:  March 01, 2017. FINDINGS: Interval removal of previously seen nasogastric tube. Minimally prominent air-filled loops of small bowel. Cholecystectomy. IMPRESSION: Interval removal of enteric tube. Minimally prominent air-filled loops of small bowel may reflect ileus. Electronically Signed   By: Titus Dubin M.D.   On: 03/07/2017 12:07   Dg Chest Port 1 View  Result Date: 03/07/2017 CLINICAL DATA:  Evaluate for a nasogastric tube. EXAM: PORTABLE CHEST 1 VIEW COMPARISON:  03/07/2017 FINDINGS: Nasogastric tube has been pulled back and it is now in the upper chest near the thoracic inlet. Endotracheal tube is roughly 4.1 cm above  the carina. There continues to be enlarged interstitial and vascular markings in both lungs. Cardiac silhouette is upper limits of normal and stable. Central line tip in the SVC region. Negative for a pneumothorax. IMPRESSION: Nasogastric tube has been pulled back and located in the upper chest as described. Persistent patchy lung densities suggestive for pulmonary edema and/or airspace disease. Support apparatuses as described. Electronically Signed   By: Markus Daft M.D.   On: 03/07/2017 12:11   Dg Chest Port 1 View  Result Date: 03/07/2017 CLINICAL DATA:  Acute respiratory failure . EXAM: PORTABLE CHEST 1 VIEW COMPARISON:  03/06/2017 . FINDINGS: Endotracheal tube, NG tube, right PICC line stable position. Stable cardiomegaly. Diffuse bilateral pulmonary interstitial prominence consistent CHF again noted. Slight interval clearing. Small left pleural effusion. No pneumothorax . IMPRESSION: 1. Lines and tubes in stable position. 2. Cardiomegaly with diffuse bilateral from interstitial prominence consistent CHF again noted. Slight interval clearing. Small left pleural effusion. Electronically Signed   By: Marcello Moores  Register   On: 03/07/2017 06:17   Dg Abd Portable 1v  Result Date: 03/07/2017 CLINICAL DATA:  Repositioning of NG tube. EXAM: PORTABLE ABDOMEN - 1 VIEW COMPARISON:  One-view abdomen 03/07/2017 FINDINGS: The side port of the NG tube is now in the antrum of the stomach. By gas pattern is normal. Bibasilar airspace disease remains, left greater than right. Left pleural effusion is present. IMPRESSION: 1. NG tube in place.  The side port is in the antrum of the stomach. 2. Normal bowel gas pattern. 3. Bibasilar airspace disease/lobar pneumonia. Electronically Signed   By: San Morelle M.D.   On: 03/07/2017 13:09    Medications:  I have reviewed the patient's current medications. Scheduled: . budesonide (PULMICORT) nebulizer solution  0.5 mg Nebulization BID  . chlorhexidine gluconate  (MEDLINE  KIT)  15 mL Mouth Rinse BID  . famotidine  20 mg Per Tube BID  . feeding supplement (PRO-STAT SUGAR FREE 64)  60 mL Per Tube BID  . feeding supplement (VITAL HIGH PROTEIN)  1,000 mL Per Tube Q24H  . free water  200 mL Per Tube Q8H  . heparin  5,000 Units Subcutaneous Q8H  . insulin aspart  2-6 Units Subcutaneous Q4H  . ipratropium-albuterol  3 mL Nebulization Q6H  . mouth rinse  15 mL Mouth Rinse 10 times per day  . methylPREDNISolone (SOLU-MEDROL) injection  20 mg Intravenous Q12H  . multivitamin  15 mL Per Tube Daily  . oxyCODONE  2.5 mg Oral TID  . QUEtiapine  50 mg Per Tube QHS  . sodium chloride flush  10-40 mL Intracatheter Q12H    Assessment/Plan: Patient with significant improvement today.  Alert, laughing to jokes, nodding yes/no, follows most simple commands. Not responding to commands to move arms, left arm appears weaker however moves arms equally and anti-gravity when prompted, may be cognitive in etiology as opposed to weakness.  Will continue to follow with you.     LOS: 10 days   Sarina Ill, MD Neurology [phone] 03/08/2017  11:06 AM

## 2017-03-08 NOTE — Evaluation (Signed)
Physical Therapy Evaluation Patient Details Name: Miranda [name] MRN: [id number] DOB: [birth year] Today's Date: 03/08/2017   History of Present Illness  presented to ER after unresponsive episode/cardiac arrest in home; admitted for hospitalization secondary to cardiac arrest with ventricular fibrillation (requiring shock x3 prior to ROSC), status post hypothermia protocol (initiated 8/21, rewarming initiated 8/25). Underwent emergent cardiac cath upon arrival to facility, with no significant disease noted, but with significantly reduced EF (15%).  Currently intubated (failed initial intubation 8/25 requiring reintubation within minutes) with full support; however, alert and following some simple commands.  MRI negative for acute insult, but some discussion of possible anoxic brain injury noted in chart.   Clinical Impression  Upon evaluation, patient alert; follows some (25%) simple verbal commands, but demonstrates good effort/participation within current ability.  Globally weak and deconditioned due to extended hospitalization/illness, but does demonstrate some degree of active movement (2+ to 3-/5 strength) at all joints in bilat UE/LEs.  No significant unilateral weakness or sensory deficit appreciated this date.  Visually tracks throughout session, responds to therapist with appropriate facial expression (smiles, raises eyebrows).  Mobility deferred per RN request to initiate supine therex only; will plan to attempt chair position in bed next date as appropriate. Family at bedside throughout session and very supportive/encouraging to patient.  Very engaged in recovery process. Would benefit from skilled PT to address above deficits and promote optimal return to PLOF; recommend transition to acute inpatient rehab upon discharge for high-intensity, post-acute rehab services.      Follow Up Recommendations CIR    Equipment Recommendations       Recommendations for Other Services        Precautions / Restrictions Precautions Precautions: Fall Precaution Comments: NG tube, mechanical ventilation, rectal tube, R PICC Restrictions Weight Bearing Restrictions: No      Mobility  Bed Mobility               General bed mobility comments: deferred secondary to ventilator and patient fatigue after supine therex  Transfers                 General transfer comment: deferred secondary to ventilator and patient fatigue after supine therex  Ambulation/Gait             General Gait Details: deferred secondary to ventilator and patient fatigue after supine therex  Stairs            Wheelchair Mobility    Modified Rankin (Stroke Patients Only)       Balance                                             Pertinent Vitals/Pain Pain Assessment: Faces Faces Pain Scale: No hurt    Home Living Family/patient expects to be discharged to:: Private residence Living Arrangements: Spouse/significant other Available Help at Discharge: Family Type of Home: Apartment Home Access: Level entry     Home Layout: One level Home Equipment: None      Prior Function Level of Independence: Independent         Comments: Indep with ADLs, household and community mobilization; working full-time (6 nights, 12-hour shifts, 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     Hand Dominance   Dominant Hand: Right    Extremity/Trunk Assessment   Upper Extremity Assessment Upper Extremity Assessment: Generalized weakness (difficulty to fully assess due  to inconsistent command following/participation, appears at least 3-/5 all joints bilat UEs; no significant sensory deficit detected)    Lower Extremity Assessment Lower Extremity Assessment:  (difficulty to fully assess due to inconsistent command following/participation, appears at least 3-/5 all joints bilat LEs, except unable to elicit active hip/knee extension bilat (suspect due to  cognition); no significant sensory deficit detected)       Communication   Communication:  (Intubated; some use of facial expressions noted)  Cognition Arousal/Alertness: Awake/alert Behavior During Therapy: WFL for tasks assessed/performed Overall Cognitive Status: Difficult to assess                                 General Comments: Nods head yes/no, but inconsistently; follows some very simple verbal commands (25%), but often requires hand-over-hand or return demonstration to initiate      General Comments      Exercises Other Exercises Other Exercises: Supine therex, act assist, to bilat UE/LEs: shoulder flex/ext/IR/ER, elbow flex/ext, forearm pronation/supination, wrist circumduction and gross grasp/release; hip/knee flex/ext, hip abduct/adduct and ankle PF/DF.  Some degree of active movement noted at all joints except active forearm pronation/supination and hip/knee extension; anticipate due to cognition vs weakness, however, given overall presentation.  No significant unilateral weakness appreciated.   Assessment/Plan    PT Assessment Patient needs continued PT services  PT Problem List Decreased strength;Decreased range of motion;Decreased activity tolerance;Decreased balance;Decreased mobility;Decreased coordination;Decreased cognition;Decreased knowledge of use of DME;Decreased safety awareness;Decreased knowledge of precautions;Cardiopulmonary status limiting activity;Pain       PT Treatment Interventions DME instruction;Gait training;Stair training;Functional mobility training;Therapeutic activities;Therapeutic exercise;Balance training;Neuromuscular re-education;Patient/family education;Cognitive remediation    PT Goals (Current goals can be found in the Care Plan section)  Acute Rehab PT Goals Patient Stated Goal: per family, to participate as able PT Goal Formulation: With patient/family Time For Goal Achievement: 03/22/17 Potential to Achieve Goals:  Good Additional Goals Additional Goal #1: Assess and establish goals for progressive mobility as medically appropriate.    Frequency Min 2X/week   Barriers to discharge Decreased caregiver support      Co-evaluation               AM-PAC PT "6 Clicks" Daily Activity  Outcome Measure Difficulty turning over in bed (including adjusting bedclothes, sheets and blankets)?: Unable Difficulty moving from lying on back to sitting on the side of the bed? : Unable Difficulty sitting down on and standing up from a chair with arms (e.g., wheelchair, bedside commode, etc,.)?: Unable Help needed moving to and from a bed to chair (including a wheelchair)?: Total Help needed walking in hospital room?: Total Help needed climbing 3-5 steps with a railing? : Total 6 Click Score: 6    End of Session   Activity Tolerance: Patient limited by fatigue Patient left: in bed;with call bell/phone within reach;with family/visitor present Nurse Communication: Mobility status PT Visit Diagnosis: Muscle weakness (generalized) (M62.81);Difficulty in walking, not elsewhere classified (R26.2)    Time: 1450-1511 PT Time Calculation (min) (ACUTE ONLY): 21 min   Charges:   PT Evaluation $PT Eval Moderate Complexity: 1 Mod PT Treatments $Therapeutic Exercise: 8-22 mins   PT G Codes:   PT G-Codes **NOT FOR INPATIENT CLASS** Functional Assessment Tool Used: AM-PAC 6 Clicks Basic Mobility Functional Limitation: Mobility: Walking and moving around Mobility: Walking and Moving Around Current Status (Y1017): 100 percent impaired, limited or restricted Mobility: Walking and Moving Around Goal Status (P1025): At least 20  percent but less than 40 percent impaired, limited or restricted    Tanvi Gatling H. Manson Passey, PT, DPT, NCS 03/08/17, 3:39 PM [phone]

## 2017-03-08 NOTE — Progress Notes (Signed)
Dr Chales Abrahams aware pt having runs PVCs, seem to coincide with periods of patient laughing or possibly hiccuping; writer inquired if patient may be ready for pressure support on vent,  Dr Will evaluate

## 2017-03-08 NOTE — Progress Notes (Addendum)
Per Dr Chales Abrahams it is ok to order physical therapy for patient

## 2017-03-09 ENCOUNTER — Inpatient Hospital Stay: Payer: BC Managed Care – PPO

## 2017-03-09 DIAGNOSIS — Z9911 Dependence on respirator [ventilator] status: Secondary | ICD-10-CM

## 2017-03-09 LAB — GLUCOSE, CAPILLARY
GLUCOSE-CAPILLARY: 122 mg/dL — AB (ref 65–99)
GLUCOSE-CAPILLARY: 189 mg/dL — AB (ref 65–99)
GLUCOSE-CAPILLARY: 178 mg/dL — AB (ref 65–99)
GLUCOSE-CAPILLARY: 109 mg/dL — AB (ref 65–99)
GLUCOSE-CAPILLARY: 132 mg/dL — AB (ref 65–99)
Glucose-Capillary: 130 mg/dL — ABNORMAL HIGH (ref 65–99)
Glucose-Capillary: 145 mg/dL — ABNORMAL HIGH (ref 65–99)
Glucose-Capillary: 160 mg/dL — ABNORMAL HIGH (ref 65–99)
Glucose-Capillary: 214 mg/dL — ABNORMAL HIGH (ref 65–99)

## 2017-03-09 LAB — BLOOD GAS, ARTERIAL
Acid-Base Excess: 6.2 mmol/L — ABNORMAL HIGH (ref 0.0–2.0)
Allens test (pass/fail): POSITIVE — AB
Bicarbonate: 29.5 mmol/L — ABNORMAL HIGH (ref 20.0–28.0)
FIO2: 0.28
O2 Saturation: 97.8 %
PEEP/CPAP: 5 cmH2O
Patient temperature: 37
Pressure support: 18 cmH2O
pCO2 arterial: 37 mmHg (ref 32.0–48.0)
pH, Arterial: 7.51 — ABNORMAL HIGH (ref 7.350–7.450)
pO2, Arterial: 91 mmHg (ref 83.0–108.0)

## 2017-03-09 LAB — PHOSPHORUS: PHOSPHORUS: 2.7 mg/dL (ref 2.5–4.6)

## 2017-03-09 LAB — CULTURE, BAL-QUANTITATIVE: CULTURE: NO GROWTH

## 2017-03-09 LAB — BASIC METABOLIC PANEL
ANION GAP: 10 (ref 5–15)
BUN: 33 mg/dL — AB (ref 6–20)
CHLORIDE: 110 mmol/L (ref 101–111)
CO2: 24 mmol/L (ref 22–32)
Calcium: 8.3 mg/dL — ABNORMAL LOW (ref 8.9–10.3)
Creatinine, Ser: 0.54 mg/dL (ref 0.44–1.00)
GFR calc Af Amer: >60 mL/min (ref >60)
GFR calc non Af Amer: >60 mL/min (ref >60)
GLUCOSE: 232 mg/dL — AB (ref 65–99)
POTASSIUM: 3.6 mmol/L (ref 3.5–5.1)
Sodium: 144 mmol/L (ref 135–145)

## 2017-03-09 LAB — CULTURE, BAL-QUANTITATIVE W GRAM STAIN: Special Requests: NORMAL

## 2017-03-09 LAB — MAGNESIUM: MAGNESIUM: 1.6 mg/dL — AB (ref 1.7–2.4)

## 2017-03-09 MED ORDER — INSULIN ASPART 100 UNIT/ML ~~LOC~~ SOLN: 2.0000 [IU] | SUBCUTANEOUS | Status: DC

## 2017-03-09 MED ORDER — INSULIN ASPART 100 UNIT/ML ~~LOC~~ SOLN
3.0000 [IU] | SUBCUTANEOUS | Status: DC
  Administered 2017-03-09 – 2017-03-11 (×13): 3 [IU] via SUBCUTANEOUS
  Filled 2017-03-09 (×13): qty 1

## 2017-03-09 MED ORDER — DEXTROSE 10 % IV SOLN: INTRAVENOUS | Status: DC | PRN

## 2017-03-09 MED ORDER — INSULIN GLARGINE 100 UNIT/ML ~~LOC~~ SOLN
20.0000 [IU] | SUBCUTANEOUS | Status: DC
  Administered 2017-03-09: 20 [IU] via SUBCUTANEOUS
  Filled 2017-03-09: qty 0.2

## 2017-03-09 MED ORDER — POTASSIUM CHLORIDE 20 MEQ PO PACK
40.0000 meq | PACK | Freq: Once | ORAL | Status: AC
  Administered 2017-03-09: 40 meq via ORAL
  Filled 2017-03-09: qty 2

## 2017-03-09 MED ORDER — MAGNESIUM SULFATE 2 GM/50ML IV SOLN
2.0000 g | Freq: Once | INTRAVENOUS | Status: AC
  Administered 2017-03-09: 2 g via INTRAVENOUS
  Filled 2017-03-09: qty 50

## 2017-03-09 MED ORDER — INSULIN ASPART 100 UNIT/ML ~~LOC~~ SOLN: 3.0000 [IU] | SUBCUTANEOUS | Status: DC

## 2017-03-09 MED ORDER — INSULIN ASPART 100 UNIT/ML ~~LOC~~ SOLN
0.0000 [IU] | SUBCUTANEOUS | Status: DC
  Administered 2017-03-09 (×2): 3 [IU] via SUBCUTANEOUS
  Administered 2017-03-09: 4 [IU] via SUBCUTANEOUS
  Administered 2017-03-10 (×2): 3 [IU] via SUBCUTANEOUS
  Administered 2017-03-10: 4 [IU] via SUBCUTANEOUS
  Administered 2017-03-11 – 2017-03-14 (×3): 3 [IU] via SUBCUTANEOUS
  Filled 2017-03-09 (×9): qty 1

## 2017-03-09 MED ORDER — INSULIN ASPART 100 UNIT/ML ~~LOC~~ SOLN
2.0000 [IU] | SUBCUTANEOUS | Status: DC
  Administered 2017-03-09: 6 [IU] via SUBCUTANEOUS
  Filled 2017-03-09: qty 1

## 2017-03-09 MED ORDER — INSULIN GLARGINE 100 UNIT/ML ~~LOC~~ SOLN
20.0000 [IU] | Freq: Every day | SUBCUTANEOUS | Status: DC
  Administered 2017-03-10 (×2): 20 [IU] via SUBCUTANEOUS
  Filled 2017-03-09 (×3): qty 0.2

## 2017-03-09 NOTE — Progress Notes (Signed)
[facility] - [place] at [facility]   PATIENT NAME: Miranda [name]    MR#:  [id number]  DATE OF BIRTH:  11-Sep-1960  SUBJECTIVE:   Intubated,sedated and on the vent .  tapered sedation,  More calm and follows simple commands today. Respond by noding her head and facial expressions. Tried to write on a paper, but could not write any letters and seems frustrated by that.  REVIEW OF SYSTEMS:   Review of Systems  Unable to perform ROS: Intubated   DRUG ALLERGIES:   Allergies  Allergen Reactions  . Ace Inhibitors   . Compazine [Prochlorperazine Edisylate]   . Lisinopril   . Penicillins     Has patient had a PCN reaction causing immediate rash, facial/tongue/throat swelling, SOB or lightheadedness with hypotension: Unknown Has patient had a PCN reaction causing severe rash involving mucus membranes or skin necrosis: Unknown Has patient had a PCN reaction that required hospitalization: Unknown Has patient had a PCN reaction occurring within the last 10 years: Unknown If all of the above answers are "NO", then may proceed with Cephalosporin use.     VITALS:  Blood pressure 121/65, pulse (!) 58, temperature 99 F (37.2 C), temperature source Axillary, resp. rate (!) 27, height 5\' 2"  (1.575 m), weight 86.9 kg (191 lb 9.3 oz), SpO2 99 %.  PHYSICAL EXAMINATION:   Physical Exam  GENERAL:  56 y.o.-year-old patient lying in the bed with no acute distress.  EYES: Pupils equal, round, reactive to light . No scleral icterus. Extraocular muscles intact.  HEENT: Head atraumatic, normocephalic. Oropharynx and nasopharynx clear. Intubated NECK:  Supple, no jugular venous distention. No thyroid enlargement, no tenderness, NG tube.  LUNGS: Normal breath sounds bilaterally, no wheezing, rales, rhonchi. No use of accessory muscles of respiration.  CARDIOVASCULAR: S1, S2 normal. No murmurs, rubs, or gallops.  ABDOMEN: Soft, nontender, nondistended. Bowel sounds feeble. No  organomegaly or mass.  EXTREMITIES: No cyanosis, clubbing or edema b/l.    NEUROLOGIC: Intubated on the ventilator , but looks and respond by noding her head. Moves limbs to simple commands. Was trying to write with a pen on a paper, but could not write any letter. PSYCHIATRIC:   intubated on the ventilator , without sedation, calm and follows simple commands. SKIN: No obvious rash, lesion, or ulcer.   LABORATORY PANEL:  CBC  Recent Labs Lab 03/08/17 0539  WBC 7.0  HGB 9.0*  HCT 26.4*  PLT 226    Chemistries   Recent Labs Lab 03/05/17 0404  03/09/17 0459  NA 145  < > 144  K 3.9  < > 3.6  CL 111  < > 110  CO2 27  < > 24  GLUCOSE 117*  < > 232*  BUN 41*  < > 33*  CREATININE 0.54  < > 0.54  CALCIUM 8.1*  < > 8.3*  MG  --   < > 1.6*  AST 110*  --   --   ALT 169*  --   --   ALKPHOS 117  --   --   BILITOT 0.6  --   --   < > = values in this interval not displayed. Cardiac Enzymes No results for input(s): TROPONINI in the last 168 hours. RADIOLOGY:  Dg Chest Port 1 View  Result Date: 03/09/2017 CLINICAL DATA:  Respiratory failure.  Intubated. EXAM: PORTABLE CHEST 1 VIEW COMPARISON:  03/09/2015. FINDINGS: Endotracheal tube in satisfactory position. Nasogastric tube extending into the stomach. Right PICC tip in  the inferior aspect of the superior vena cava. Stable enlarged cardiac silhouette and bilateral interstitial opacities. Unremarkable bones. IMPRESSION: Stable cardiomegaly and interstitial lung disease. Electronically Signed   By: Beckie Salts M.D.   On: 03/09/2017 08:30   Dg Chest Port 1 View  Result Date: 03/08/2017 CLINICAL DATA:  Acute respiratory failure.  Intubated. EXAM: PORTABLE CHEST 1 VIEW COMPARISON:  Yesterday. FINDINGS: Endotracheal tube in satisfactory position. Stable enlarged cardiac silhouette. Stable diffuse prominence of the interstitial markings with no definite superimposed airspace opacity at this time. No pleural fluid. Nasogastric tube extending into  the stomach. Unremarkable bones. IMPRESSION: Stable cardiomegaly and interstitial lung disease with no definite superimposed alveolar edema or pneumonia at this time. Electronically Signed   By: Beckie Salts M.D.   On: 03/08/2017 18:01   ASSESSMENT AND PLAN:   Ms. Laprade reportedly had diarrhea over the last 2-3 days for which she was taking up to 12 Imodium per day. She was trying to stay well hydrated and had also stopped taking her blood pressure medications on account of her GI illness. Around 9 PM, Ms. Wingard's husband found the patient seated on the toilet. She attempted to stand up but passed out.  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 Severely reduced LVEF less than <20%  -repeat echo showed EF of 60-65% -Patient was on IV levophed,Milrinone- improved now. - Was IV propofol -Patient was  on hypothermia protocol -CT chest negative for PE -CXR shows pulmonary edema. Received Lasix - Now off sedation,and waiting to do extubation , when allowed by ICU team. -Pt's husband told me, it will be on Tuesday.  *Acute renal failure secondary to #1 -making good urine -Creatinine stable  *Febrile illness -Low-grade fever, tachycardia elevated white count of 40,000--- 19K -Blood cultures so far negative -Empiric IV vancomycin and Zosyn- changed to rocephin -C. Difficile negative -chest x-ray shows diffuse pulmonary infiltrate - Had bronch done 03/07/17- 10 ml cloudy thick secretion suctioned from right Middle lobe. Sent for culture.  *altered mental status -Patient currently has periods of agitation  -Seen by Dr. Thad Ranger -- neurology -Recommend EEG and brain MRI. - No acute injuries. Taper sedation slowly. - likely due to sedative meds. - more calm and following commands today. - could not write on her paper. Seems frustrated by that.  *Metabolic acidosis  secondary to #1  *Hyperglycemia -Patient currently off insulin drip  * Nutrition -started on Tube feeding  *DVT prophylaxis subcutaneous heparin  . Case discussed with Care Management/Social Worker. Management plans discussed with the  family and they are in agreement.  CODE STATUS: Full  DVT Prophylaxis: Subcutaneous heparin  TOTAL CRITICAL TIME TAKING CARE OF THIS PATIENT: 30 minutes.  >50% time spent on counselling and coordination of care Spoke to her husband in room today.  Note: This dictation was prepared with Dragon dictation along with smaller phrase technology. Any transcriptional errors that result from this process are unintentional.  Altamese Dilling M.D on 03/09/2017 at 2:43 PM  Between 7am to 6pm - Pager - [phone]  After 6pm go to [email or web address] - Social research officer, government  [facility]  Office  [phone]  CC: Primary care physician; Merwyn Katos, MD

## 2017-03-09 NOTE — Progress Notes (Signed)
Pt's husband states that he will be unavailable on September 4th from 0900-1300 and September 6th from 1300-1600. He wanted to staff to be aware of same.

## 2017-03-09 NOTE — Progress Notes (Signed)
OT Cancellation Note  Patient Details Name: Miranda [name] MRN: [id number] DOB: 1961-05-19   Cancelled Treatment:    Reason Eval/Treat Not Completed: Patient at procedure or test/ unavailable. Order received, chart reviewed. Spoke with RN, okay to evaluate. Upon attempt, husband and sister present, pt with MD in room for assessment. Will re-attempt OT evaluation at later time.  Richrd Prime, MPH, MS, OTR/L ascom [phone] 03/09/17, 11:44 AM

## 2017-03-09 NOTE — Progress Notes (Signed)
MEDICATION RELATED CONSULT NOTE  Pharmacy Consult for Electrolyte Monitoring Indication: Hypomagnesemia  Allergies  Allergen Reactions  . Ace Inhibitors   . Compazine [Prochlorperazine Edisylate]   . Lisinopril   . Penicillins     Has patient had a PCN reaction causing immediate rash, facial/tongue/throat swelling, SOB or lightheadedness with hypotension: Unknown Has patient had a PCN reaction causing severe rash involving mucus membranes or skin necrosis: Unknown Has patient had a PCN reaction that required hospitalization: Unknown Has patient had a PCN reaction occurring within the last 10 years: Unknown If all of the above answers are "NO", then may proceed with Cephalosporin use.     Patient Measurements: Height: 5\' 2"  (157.5 cm) Weight: 191 lb 9.3 oz (86.9 kg) IBW/kg (Calculated) : 50.1  Vital Signs: Temp: 98.1 F (36.7 C) (09/02 0400) Temp Source: Axillary (09/02 0400) BP: 136/61 (09/02 0600) Pulse Rate: 80 (09/02 0600) Intake/Output from previous day: 09/01 0701 - 09/02 0700 In: 4057.6 [I.V.:2347.6; NG/GT:1670] Out: 1735 [Urine:1425; Stool:310] Intake/Output from this shift: No intake/output data recorded.  Labs:  Recent Labs  03/07/17 0351 03/08/17 0539 03/08/17 1800 03/08/17 1841 03/09/17 0459  WBC  --  7.0  --   --   --   HGB  --  9.0*  --   --   --   HCT  --  26.4*  --   --   --   PLT  --  226  --   --   --   CREATININE 0.53 0.67  --  0.56 0.54  MG 2.2 2.3 2.0  --  1.6*  PHOS 2.7  --   --   --  2.7   Estimated Creatinine Clearance: 78.4 mL/min (by C-G formula based on SCr of 0.54 mg/dL).  Sodium (mmol/L)  Date Value  03/09/2017 144  08/16/2016 141   Potassium (mmol/L)  Date Value  03/09/2017 3.6   Calcium (mg/dL)  Date Value  54/65/0354 8.3 (L)   Albumin (g/dL)  Date Value  65/68/1275 2.3 (L)    Assessment: 56 y/o F s/p cardiac arrest and hypothermia protocol with hypomagnesemia. Due to high risk of recurrent arrhythmia, cardiology  wants a goal K of > or = 4 and Mg > or = 2.   K 3.6, Phos 2.7, Mag 1.6 - Mag 2 g IV x1 already ordered  Plan:  Will order KCl 40 mEq packet x1  Pharmacy will continue to monitor and adjust per consult.   Marty Heck 03/09/2017,12:38 PM

## 2017-03-09 NOTE — Progress Notes (Signed)
Rehab Admissions Coordinator Note:  Patient was screened by Trish Mage for appropriateness for an Inpatient Acute Rehab Consult.  At this time, per progress note, patient is intubated and on the vent.  Once off the vent and participating with therapies, then can consider inpatient rehab if needed.  Lelon Frohlich M 03/09/2017, 3:09 PM  I can be reached at [phone].

## 2017-03-09 NOTE — Progress Notes (Signed)
[facility] Critical Care Medicine Progess Note     ASSESSMENT  ACUTE HYPOXIC RESPIRATORY FAILURE S/P CARDIAC ARREST ANEMIA ALTERED MENTAL STATUS HYPOMAGNESEMIA  PLAN Will wean from ventilator as tolerated. On BD. Sedation as needed. On precedex gtt. On narcotics. No indication for transfusion at this time. Electrolyte replacement as needed. Glycemic control. DVT/GI prophylaxis.  Daughter and husband updated at bedside. All questions and concerns addressed.  CC: 32 minutes  Miranda Gibbs MD    SUBJECTIVE:  Patient seen and examined at bedside. Noted events overnight. Husband requesting patient be put on ?? propofol drip overnight to let her rest and use fentanyl prn at night. Patient alert and following commands at time of my evaluation and denies any trouble breathing. Appears a little confused on questioning.    VITAL SIGNS: Temp:  [98.1 F (36.7 C)-100.1 F (37.8 C)] 98.1 F (36.7 C) (09/02 0400) Pulse Rate:  [54-94] 80 (09/02 0600) Resp:  [17-32] 26 (09/02 0600) BP: (105-148)/(54-86) 136/61 (09/02 0600) SpO2:  [96 %-100 %] 100 % (09/02 0829) FiO2 (%):  [28 %-35 %] 28 % (09/02 0829) Weight:  [191 lb 9.3 oz (86.9 kg)] 191 lb 9.3 oz (86.9 kg) (09/02 0500)    PHYSICAL EXAMINATION: Physical Examination:   VS: BP 136/61   Pulse 80   Temp 98.1 F (36.7 C) (Axillary)   Resp (!) 26   Ht 5\' 2"  (1.575 m)   Wt 191 lb 9.3 oz (86.9 kg)   SpO2 100%   BMI 35.04 kg/m    General Appearance: intubated but follows commands Neuro: no gross focal deficits HEENT: intubated Pulmonary: decreased air entry Cardiovascular : Normal S1,S2.  No m/r/g.   Abdomen: Benign, Soft, non-tender. Skin:   warm, no rashes, no ecchymosis  Extremities: normal, no cyanosis, clubbing.   LABORATORY PANEL:   CBC  Recent Labs Lab 03/08/17 0539  WBC 7.0  HGB 9.0*  HCT 26.4*  PLT 226    Chemistries   Recent Labs Lab 03/05/17 0404  03/09/17 0459  NA 145  < > 144  K 3.9  < >  3.6  CL 111  < > 110  CO2 27  < > 24  GLUCOSE 117*  < > 232*  BUN 41*  < > 33*  CREATININE 0.54  < > 0.54  CALCIUM 8.1*  < > 8.3*  MG  --   < > 1.6*  PHOS  --   < > 2.7  AST 110*  --   --   ALT 169*  --   --   ALKPHOS 117  --   --   BILITOT 0.6  --   --   < > = values in this interval not displayed.   Recent Labs Lab 03/09/17 0103 03/09/17 0159 03/09/17 0312 03/09/17 0504 03/09/17 0746 03/09/17 1153  GLUCAP 122* 160* 189* 214* 178* 130*    Recent Labs Lab 03/04/17 1750 03/06/17 1145 03/09/17 0906  PHART 7.41 7.48* 7.51*  PCO2ART 42 37 37  PO2ART 87 114* 91    Recent Labs Lab 03/05/17 0404  AST 110*  ALT 169*  ALKPHOS 117  BILITOT 0.6  ALBUMIN 2.3*    Cardiac Enzymes No results for input(s): TROPONINI in the last 168 hours.  RADIOLOGY:  Dg Chest Port 1 View  Result Date: 03/09/2017 CLINICAL DATA:  Respiratory failure.  Intubated. EXAM: PORTABLE CHEST 1 VIEW COMPARISON:  03/09/2015. FINDINGS: Endotracheal tube in satisfactory position. Nasogastric tube extending into the stomach. Right PICC tip in  the inferior aspect of the superior vena cava. Stable enlarged cardiac silhouette and bilateral interstitial opacities. Unremarkable bones. IMPRESSION: Stable cardiomegaly and interstitial lung disease. Electronically Signed   By: Beckie Salts M.D.   On: 03/09/2017 08:30   Dg Chest Port 1 View  Result Date: 03/08/2017 CLINICAL DATA:  Acute respiratory failure.  Intubated. EXAM: PORTABLE CHEST 1 VIEW COMPARISON:  Yesterday. FINDINGS: Endotracheal tube in satisfactory position. Stable enlarged cardiac silhouette. Stable diffuse prominence of the interstitial markings with no definite superimposed airspace opacity at this time. No pleural fluid. Nasogastric tube extending into the stomach. Unremarkable bones. IMPRESSION: Stable cardiomegaly and interstitial lung disease with no definite superimposed alveolar edema or pneumonia at this time. Electronically Signed   By:  Beckie Salts M.D.   On: 03/08/2017 18:01          03/09/2017

## 2017-03-09 NOTE — Progress Notes (Signed)
Patient Name: Miranda [name] Date of Encounter: 03/09/2017  Primary Cardiologist: Dr [facility] Problem List     Principal Problem:   Cardiac arrest with ventricular fibrillation [facility]) Active Problems:   Cardiac arrest ([place])   Acute respiratory failure ([place])   Hypokalemia   Encounter for central line placement   Cardiogenic shock ([place])   Acute pulmonary edema ([place])   Anoxic encephalopathy ([place])   Bradycardia     Subjective   Intubated Called multiple times last night regarding rhythm Indicated by nursing vfib And NSVT Review of monitor shows just PVCls and artifact. Long discussion with husband. There seems to be an odd dynamic with him not wanting to extubate her until Tuesday She is totally alert Wants to be extubated and has excellent mechanics with no wheezing  Inpatient Medications    Scheduled Meds: . budesonide (PULMICORT) nebulizer solution  0.5 mg Nebulization BID  . chlorhexidine gluconate (MEDLINE KIT)  15 mL Mouth Rinse BID  . famotidine  20 mg Per Tube BID  . feeding supplement (PRO-STAT SUGAR FREE 64)  60 mL Per Tube BID  . feeding supplement (VITAL HIGH PROTEIN)  1,000 mL Per Tube Q24H  . free water  200 mL Per Tube Q8H  . heparin  5,000 Units Subcutaneous Q8H  . insulin aspart  0-20 Units Subcutaneous Q4H  . insulin aspart  3 Units Subcutaneous Q4H  . [START ON 03/10/2017] insulin glargine  20 Units Subcutaneous Daily  . ipratropium-albuterol  3 mL Nebulization Q6H  . mouth rinse  15 mL Mouth Rinse 10 times per day  . methylPREDNISolone (SOLU-MEDROL) injection  20 mg Intravenous Q12H  . multivitamin  15 mL Per Tube Daily  . oxyCODONE  2.5 mg Oral TID  . QUEtiapine  50 mg Per Tube QHS  . sodium chloride flush  10-40 mL Intracatheter Q12H   Continuous Infusions: . dexmedetomidine (PRECEDEX) IV infusion 0.4 mcg/kg/hr (03/09/17 0600)  . dextrose    . dextrose 100 mL/hr at 03/09/17 0825  . magnesium sulfate 1 - 4 g bolus IVPB    . propofol  (DIPRIVAN) infusion Stopped (03/09/17 0653)   PRN Meds: acetaminophen, atropine, dextrose, sennosides **AND** docusate, levalbuterol, naLOXone (NARCAN)  injection, pneumococcal 23 valent vaccine, sodium chloride flush, vecuronium   Vital Signs    Vitals:   03/09/17 0400 03/09/17 0500 03/09/17 0600 03/09/17 0829  BP: (!) 148/64 (!) 146/66 136/61   Pulse: 87 94 80   Resp: (!) 25 (!) 28 (!) 26   Temp: 98.1 F (36.7 C)     TempSrc: Axillary     SpO2: 100% 100% 99% 100%  Weight:  191 lb 9.3 oz (86.9 kg)    Height:        Intake/Output Summary (Last 24 hours) at 03/09/17 1024 Last data filed at 03/09/17 0600  Gross per 24 hour  Intake          3572.91 ml  Output             1205 ml  Net          2367.91 ml   Filed Weights   03/07/17 0400 03/08/17 0622 03/09/17 0500  Weight: 185 lb 13.6 oz (84.3 kg) 187 lb 9.8 oz (85.1 kg) 191 lb 9.3 oz (86.9 kg)    Physical Exam    Affect appropriate Healthy:  appears stated age HEENT: intubated  Neck supple with no adenopathy JVP normal no bruits no thyromegaly Lungs clear with no wheezing  and good diaphragmatic motion Heart:  S1/S2 no murmur, no rub, gallop or click PMI normal Abdomen: benighn, BS positve, no tenderness, no AAA no bruit.  No HSM or HJR Distal pulses intact with no bruits No edema Neuro non-focal Skin warm and dry No muscular weakness   Labs    CBC  Recent Labs  03/08/17 0539  WBC 7.0  HGB 9.0*  HCT 26.4*  MCV 90.7  PLT 254   Basic Metabolic Panel  Recent Labs  03/07/17 0351  03/08/17 1800 03/08/17 1841 03/09/17 0459  NA 149*  < >  --  149* 144  K 3.5  < >  --  3.9 3.6  CL 113*  < >  --  115* 110  CO2 30  < >  --  26 24  GLUCOSE 111*  < >  --  196* 232*  BUN 29*  < >  --  36* 33*  CREATININE 0.53  < >  --  0.56 0.54  CALCIUM 7.6*  < >  --  8.7* 8.3*  MG 2.2  < > 2.0  --  1.6*  PHOS 2.7  --   --   --   --   < > = values in this interval not displayed. Fasting Lipid Panel  Recent Labs   03/07/17 1933  TRIG 262*   Thyroid Function Tests No results for input(s): TSH, T4TOTAL, T3FREE, THYROIDAB in the last 72 hours.  Invalid input(s): FREET3  Telemetry    NSR rates 70-80  - Personally Reviewed  ECG    NSR no acute changes normal QT   Radiology    Dg Abd 1 View  Result Date: 03/07/2017 CLINICAL DATA:  NG tube placement. EXAM: ABDOMEN - 1 VIEW COMPARISON:  March 01, 2017. FINDINGS: Interval removal of previously seen nasogastric tube. Minimally prominent air-filled loops of small bowel. Cholecystectomy. IMPRESSION: Interval removal of enteric tube. Minimally prominent air-filled loops of small bowel may reflect ileus. Electronically Signed   By: Titus Dubin M.D.   On: 03/07/2017 12:07   Dg Chest Port 1 View  Result Date: 03/09/2017 CLINICAL DATA:  Respiratory failure.  Intubated. EXAM: PORTABLE CHEST 1 VIEW COMPARISON:  03/09/2015. FINDINGS: Endotracheal tube in satisfactory position. Nasogastric tube extending into the stomach. Right PICC tip in the inferior aspect of the superior vena cava. Stable enlarged cardiac silhouette and bilateral interstitial opacities. Unremarkable bones. IMPRESSION: Stable cardiomegaly and interstitial lung disease. Electronically Signed   By: Claudie Revering M.D.   On: 03/09/2017 08:30   Dg Chest Port 1 View  Result Date: 03/08/2017 CLINICAL DATA:  Acute respiratory failure.  Intubated. EXAM: PORTABLE CHEST 1 VIEW COMPARISON:  Yesterday. FINDINGS: Endotracheal tube in satisfactory position. Stable enlarged cardiac silhouette. Stable diffuse prominence of the interstitial markings with no definite superimposed airspace opacity at this time. No pleural fluid. Nasogastric tube extending into the stomach. Unremarkable bones. IMPRESSION: Stable cardiomegaly and interstitial lung disease with no definite superimposed alveolar edema or pneumonia at this time. Electronically Signed   By: Claudie Revering M.D.   On: 03/08/2017 18:01   Dg Chest Port 1  View  Result Date: 03/07/2017 CLINICAL DATA:  Evaluate for a nasogastric tube. EXAM: PORTABLE CHEST 1 VIEW COMPARISON:  03/07/2017 FINDINGS: Nasogastric tube has been pulled back and it is now in the upper chest near the thoracic inlet. Endotracheal tube is roughly 4.1 cm above the carina. There continues to be enlarged interstitial and vascular markings in both lungs. Cardiac silhouette is  upper limits of normal and stable. Central line tip in the SVC region. Negative for a pneumothorax. IMPRESSION: Nasogastric tube has been pulled back and located in the upper chest as described. Persistent patchy lung densities suggestive for pulmonary edema and/or airspace disease. Support apparatuses as described. Electronically Signed   By: Markus Daft M.D.   On: 03/07/2017 12:11   Dg Abd Portable 1v  Result Date: 03/07/2017 CLINICAL DATA:  Repositioning of NG tube. EXAM: PORTABLE ABDOMEN - 1 VIEW COMPARISON:  One-view abdomen 03/07/2017 FINDINGS: The side port of the NG tube is now in the antrum of the stomach. By gas pattern is normal. Bibasilar airspace disease remains, left greater than right. Left pleural effusion is present. IMPRESSION: 1. NG tube in place.  The side port is in the antrum of the stomach. 2. Normal bowel gas pattern. 3. Bibasilar airspace disease/lobar pneumonia. Electronically Signed   By: San Morelle M.D.   On: 03/07/2017 13:09    Cardiac Studies   TTE 03/03/2017: Study Conclusions  - Left ventricle: The cavity size was normal. There was mild concentric hypertrophy. Systolic function was normal. The estimated ejection fraction was in the range of 55% to 60%. Wall motion was normal; there were no regional wall motion abnormalities. - Aortic valve: There was mild regurgitation. - Pulmonary arteries: Systolic pressure was mildly to moderately increased. PA peak pressure: 46 mm Hg (S).  Impressions:  - Limited echo to evaluate ejection fraction. When compared  to recent echo, LV systolic function normalized completely with improvement in ejection fraction of from 10% to 55%.  LHC 02/26/2017: Conclusion   Conclusions: 1. No angiographically significant coronary artery disease. 2. Severely reduced left ventricular contraction (<25%) with moderately elevated left ventricular filling pressure (LVEDP 28 mmHg).  Recommendations: 1. Hypothermia protocol per hospitalist and ICU teams. 2. Check magnesium level; replete electrolytes for goal potassium and magnesium greater than 4.0 and 2.0, respectively. 3. Consider gentle diuresis, given elevated LVEDP and severely reduced LVEF. 4. Obtain transthoracic echocardiogram    TTE 02/26/2017: Study Conclusions  - Left ventricle: The cavity size was mildly dilated. Systolic function was severely reduced. The estimated ejection fraction was <20%. Diffuse hypokinesis. Regional wall motion abnormalities cannot be excluded. The study is not technically sufficient to allow evaluation of LV diastolic function. - Mitral valve: There was mild regurgitation. - Right ventricle: Systolic function was mildly reduced. - Pulmonary arteries: Systolic pressure could not be accurately estimated. - Inferior vena cava: The vessel was normal in size. The respirophasic diameter changes were in the normal range (>= 50%), consistent with normal central venous pressure.   Patient Profile     56 y.o. female with history of HTN, migraine disorder, and dependent edema who has had diarrhea for the past 2-3 days, taking up to 12 Imodium daily presented to [facility] with VF arrest. Cardiac cath showed no significant CAD.  Assessment & Plan    1. VF arrest: -Likely due to electrolyte abnormalities. Continue supportive care. EF improved to normal on subsequent echo. No plans for AICD per husband who is also a nurse    2. Acute hypoxic respiratory failure: - She remains intubated previous premature  extubation per husband Dr Lyndel Safe to  See today and discuss possible extubation. Not clear to me that she needs to wait Another 48 hours  3. Acute systolic CHF:  Resolved azotemic on labs lasix held   4. Cardiogenic shock: -Resolved  5. AKI: - Resolved  6. Mental status: much improved responsive and  interactive MRI no structural issues and EEG ok so prognosis seems a lot better at this time  [employer]

## 2017-03-10 ENCOUNTER — Inpatient Hospital Stay: Payer: BC Managed Care – PPO

## 2017-03-10 LAB — BASIC METABOLIC PANEL
Anion gap: 7 (ref 5–15)
BUN: 27 mg/dL — ABNORMAL HIGH (ref 6–20)
CALCIUM: 8.7 mg/dL — AB (ref 8.9–10.3)
CO2: 29 mmol/L (ref 22–32)
CREATININE: 0.43 mg/dL — AB (ref 0.44–1.00)
Chloride: 107 mmol/L (ref 101–111)
GFR calc Af Amer: >60 mL/min (ref >60)
GFR calc non Af Amer: >60 mL/min (ref >60)
GLUCOSE: 133 mg/dL — AB (ref 65–99)
Potassium: 4.2 mmol/L (ref 3.5–5.1)
Sodium: 143 mmol/L (ref 135–145)

## 2017-03-10 LAB — CBC
HEMATOCRIT: 29.2 % — AB (ref 35.0–47.0)
Hemoglobin: 9.9 g/dL — ABNORMAL LOW (ref 12.0–16.0)
MCH: 31 pg (ref 26.0–34.0)
MCHC: 33.8 g/dL (ref 32.0–36.0)
MCV: 91.6 fL (ref 80.0–100.0)
Platelets: 317 10*3/uL (ref 150–440)
RBC: 3.19 MIL/uL — ABNORMAL LOW (ref 3.80–5.20)
RDW: 13.6 % (ref 11.5–14.5)
WBC: 9.5 10*3/uL (ref 3.6–11.0)

## 2017-03-10 LAB — GLUCOSE, CAPILLARY
GLUCOSE-CAPILLARY: 100 mg/dL — AB (ref 65–99)
Glucose-Capillary: 117 mg/dL — ABNORMAL HIGH (ref 65–99)
Glucose-Capillary: 121 mg/dL — ABNORMAL HIGH (ref 65–99)
Glucose-Capillary: 129 mg/dL — ABNORMAL HIGH (ref 65–99)
Glucose-Capillary: 148 mg/dL — ABNORMAL HIGH (ref 65–99)
Glucose-Capillary: 155 mg/dL — ABNORMAL HIGH (ref 65–99)
Glucose-Capillary: 87 mg/dL (ref 65–99)

## 2017-03-10 LAB — MAGNESIUM: Magnesium: 1.9 mg/dL (ref 1.7–2.4)

## 2017-03-10 LAB — TRIGLYCERIDES: Triglycerides: 119 mg/dL (ref <150)

## 2017-03-10 LAB — PHOSPHORUS: Phosphorus: 4.1 mg/dL (ref 2.5–4.6)

## 2017-03-10 MED ORDER — MAGNESIUM SULFATE IN D5W 1-5 GM/100ML-% IV SOLN
1.0000 g | Freq: Once | INTRAVENOUS | Status: AC
  Administered 2017-03-10: 1 g via INTRAVENOUS
  Filled 2017-03-10: qty 100

## 2017-03-10 NOTE — Progress Notes (Signed)
MEDICATION RELATED CONSULT NOTE  Pharmacy Consult for Electrolyte Monitoring Indication: Hypomagnesemia  Allergies  Allergen Reactions  . Ace Inhibitors   . Compazine [Prochlorperazine Edisylate]   . Lisinopril   . Penicillins     Has patient had a PCN reaction causing immediate rash, facial/tongue/throat swelling, SOB or lightheadedness with hypotension: Unknown Has patient had a PCN reaction causing severe rash involving mucus membranes or skin necrosis: Unknown Has patient had a PCN reaction that required hospitalization: Unknown Has patient had a PCN reaction occurring within the last 10 years: Unknown If all of the above answers are "NO", then may proceed with Cephalosporin use.     Patient Measurements: Height: 5\' 2"  (157.5 cm) Weight: 190 lb 14.7 oz (86.6 kg) IBW/kg (Calculated) : 50.1  Vital Signs: Temp: 99.1 F (37.3 C) (09/03 0725) Temp Source: Axillary (09/03 0725) BP: 146/62 (09/03 1100) Pulse Rate: 63 (09/03 1100) Intake/Output from previous day: 09/02 0701 - 09/03 0700 In: 2709.5 [I.V.:1369.5; NG/GT:1280] Out: 2265 [Urine:2020; Stool:245] Intake/Output from this shift: Total I/O In: 237.6 [I.V.:117.6; NG/GT:120] Out: 850 [Urine:250; Stool:600]  Labs:  Recent Labs  03/08/17 0539 03/08/17 1800 03/08/17 1841 03/09/17 0459 03/10/17 0530  WBC 7.0  --   --   --  9.5  HGB 9.0*  --   --   --  9.9*  HCT 26.4*  --   --   --  29.2*  PLT 226  --   --   --  317  CREATININE 0.67  --  0.56 0.54 0.43*  MG 2.3 2.0  --  1.6* 1.9  PHOS  --   --   --  2.7 4.1   Estimated Creatinine Clearance: 78.3 mL/min (A) (by C-G formula based on SCr of 0.43 mg/dL (L)).  Sodium (mmol/L)  Date Value  03/10/2017 143  08/16/2016 141   Potassium (mmol/L)  Date Value  03/10/2017 4.2   Calcium (mg/dL)  Date Value  67/06/4579 8.7 (L)   Albumin (g/dL)  Date Value  99/83/3825 2.3 (L)    Assessment: 56 y/o F s/p cardiac arrest and hypothermia protocol with  hypomagnesemia. Due to high risk of recurrent arrhythmia, cardiology wants a goal K of > or = 4 and Mg > or = 2.   K 4.2, Phos 4.1, Mag 1.9   Plan:  Will order mag sulfate 1 g IV x1 Recheck labs in AM  Pharmacy will continue to monitor and adjust per consult.   Crist Fat L 03/10/2017,11:10 AM

## 2017-03-10 NOTE — Progress Notes (Signed)
Patient remains on vent pressure support weaned to 10 patient tolerating it well. Propofol infusing during the night stopped at this time. Remains NSR to sinus brady throughout shift. Denies pain.

## 2017-03-10 NOTE — Progress Notes (Signed)
Patient Name: Miranda [name] Date of Encounter: 03/10/2017  Primary Cardiologist: Dr [facility] Problem List     Principal Problem:   Cardiac arrest with ventricular fibrillation [facility]) Active Problems:   Cardiac arrest ([place])   Acute respiratory failure ([place])   Hypokalemia   Encounter for central line placement   Cardiogenic shock ([place])   Acute pulmonary edema ([place])   Anoxic encephalopathy ([place])   Bradycardia     Subjective   She is fully alert and following commands. No arrhythmia noted. I reviewed prior described wide-complex tachycardia which was consistent with motion artifact. No evidence of ventricular tachycardia.  Inpatient Medications    Scheduled Meds: . budesonide (PULMICORT) nebulizer solution  0.5 mg Nebulization BID  . chlorhexidine gluconate (MEDLINE KIT)  15 mL Mouth Rinse BID  . famotidine  20 mg Per Tube BID  . feeding supplement (PRO-STAT SUGAR FREE 64)  60 mL Per Tube BID  . feeding supplement (VITAL HIGH PROTEIN)  1,000 mL Per Tube Q24H  . free water  200 mL Per Tube Q8H  . heparin  5,000 Units Subcutaneous Q8H  . insulin aspart  0-20 Units Subcutaneous Q4H  . insulin aspart  3 Units Subcutaneous Q4H  . insulin glargine  20 Units Subcutaneous Daily  . ipratropium-albuterol  3 mL Nebulization Q6H  . mouth rinse  15 mL Mouth Rinse 10 times per day  . methylPREDNISolone (SOLU-MEDROL) injection  20 mg Intravenous Q12H  . multivitamin  15 mL Per Tube Daily  . oxyCODONE  2.5 mg Oral TID  . QUEtiapine  50 mg Per Tube QHS  . sodium chloride flush  10-40 mL Intracatheter Q12H   Continuous Infusions: . dexmedetomidine (PRECEDEX) IV infusion 0.4 mcg/kg/hr (03/10/17 0733)  . dextrose    . dextrose 30 mL/hr at 03/10/17 0733  . propofol (DIPRIVAN) infusion Stopped (03/10/17 0700)   PRN Meds: acetaminophen, atropine, dextrose, sennosides **AND** docusate, levalbuterol, naLOXone (NARCAN)  injection, pneumococcal 23 valent vaccine, sodium chloride flush,  vecuronium   Vital Signs    Vitals:   03/10/17 0715 03/10/17 0725 03/10/17 0800 03/10/17 0900  BP:   (!) 117/55 125/74  Pulse:   65 65  Resp:   (!) 27 (!) 29  Temp:  99.1 F (37.3 C)    TempSrc:  Axillary    SpO2: 100%  98% 99%  Weight:      Height:        Intake/Output Summary (Last 24 hours) at 03/10/17 0942 Last data filed at 03/10/17 0900  Gross per 24 hour  Intake          2569.46 ml  Output             2390 ml  Net           179.46 ml   Filed Weights   03/08/17 0622 03/09/17 0500 03/10/17 0400  Weight: 187 lb 9.8 oz (85.1 kg) 191 lb 9.3 oz (86.9 kg) 190 lb 14.7 oz (86.6 kg)    Physical Exam    Affect appropriate Healthy:  appears stated age HEENT: intubated  Neck supple with no adenopathy JVP normal no bruits no thyromegaly Lungs clear with no wheezing and good diaphragmatic motion Heart:  S1/S2 no murmur, no rub, gallop or click PMI normal Abdomen: benighn, BS positve, no tenderness, no AAA no bruit.  No HSM or HJR Distal pulses intact with no bruits No edema Neuro non-focal Skin warm and dry No muscular weakness   Labs  CBC  Recent Labs  03/08/17 0539 03/10/17 0530  WBC 7.0 9.5  HGB 9.0* 9.9*  HCT 26.4* 29.2*  MCV 90.7 91.6  PLT 226 941   Basic Metabolic Panel  Recent Labs  03/09/17 0459 03/10/17 0530  NA 144 143  K 3.6 4.2  CL 110 107  CO2 24 29  GLUCOSE 232* 133*  BUN 33* 27*  CREATININE 0.54 0.43*  CALCIUM 8.3* 8.7*  MG 1.6* 1.9  PHOS 2.7 4.1   Fasting Lipid Panel  Recent Labs  03/07/17 1933  TRIG 262*   Thyroid Function Tests No results for input(s): TSH, T4TOTAL, T3FREE, THYROIDAB in the last 72 hours.  Invalid input(s): FREET3  Telemetry    NSR rates 70-80  . No evidence of ventricular arrhythmia. Few PVCs.- Personally Reviewed  ECG    NSR no acute changes normal QT   Radiology    Dg Chest Port 1 View  Result Date: 03/10/2017 CLINICAL DATA:  Acute respiratory failure, ventilatory support EXAM:  PORTABLE CHEST 1 VIEW COMPARISON:  03/09/2017 FINDINGS: Endotracheal tube is 3.4 cm above the carina. NG tube within the stomach, tip not visualized. Right PICC line tip mid SVC level. Stable cardiomegaly with vascular congestion and diffuse mixed airspace and interstitial opacities versus edema. No enlarging effusion or pneumothorax. Right peripheral mid lung nodular opacity remains indeterminate for superimposed shadows or lung nodule. Recommend attention on follow-up exams. IMPRESSION: Stable cardiomegaly with similar diffuse mixed interstitial and airspace opacities, suspicious for infection versus edema. No enlarging effusion or pneumothorax Possible right midlung peripheral lung nodule. Electronically Signed   By: Jerilynn Mages.  Shick M.D.   On: 03/10/2017 09:29   Dg Chest Port 1 View  Result Date: 03/09/2017 CLINICAL DATA:  Respiratory failure.  Intubated. EXAM: PORTABLE CHEST 1 VIEW COMPARISON:  03/09/2015. FINDINGS: Endotracheal tube in satisfactory position. Nasogastric tube extending into the stomach. Right PICC tip in the inferior aspect of the superior vena cava. Stable enlarged cardiac silhouette and bilateral interstitial opacities. Unremarkable bones. IMPRESSION: Stable cardiomegaly and interstitial lung disease. Electronically Signed   By: Claudie Revering M.D.   On: 03/09/2017 08:30   Dg Chest Port 1 View  Result Date: 03/08/2017 CLINICAL DATA:  Acute respiratory failure.  Intubated. EXAM: PORTABLE CHEST 1 VIEW COMPARISON:  Yesterday. FINDINGS: Endotracheal tube in satisfactory position. Stable enlarged cardiac silhouette. Stable diffuse prominence of the interstitial markings with no definite superimposed airspace opacity at this time. No pleural fluid. Nasogastric tube extending into the stomach. Unremarkable bones. IMPRESSION: Stable cardiomegaly and interstitial lung disease with no definite superimposed alveolar edema or pneumonia at this time. Electronically Signed   By: Claudie Revering M.D.   On:  03/08/2017 18:01    Cardiac Studies   TTE 03/03/2017: Study Conclusions  - Left ventricle: The cavity size was normal. There was mild concentric hypertrophy. Systolic function was normal. The estimated ejection fraction was in the range of 55% to 60%. Wall motion was normal; there were no regional wall motion abnormalities. - Aortic valve: There was mild regurgitation. - Pulmonary arteries: Systolic pressure was mildly to moderately increased. PA peak pressure: 46 mm Hg (S).  Impressions:  - Limited echo to evaluate ejection fraction. When compared to recent echo, LV systolic function normalized completely with improvement in ejection fraction of from 10% to 55%.  LHC 02/26/2017: Conclusion   Conclusions: 1. No angiographically significant coronary artery disease. 2. Severely reduced left ventricular contraction (<25%) with moderately elevated left ventricular filling pressure (LVEDP 28 mmHg).  Recommendations:  1. Hypothermia protocol per hospitalist and ICU teams. 2. Check magnesium level; replete electrolytes for goal potassium and magnesium greater than 4.0 and 2.0, respectively. 3. Consider gentle diuresis, given elevated LVEDP and severely reduced LVEF. 4. Obtain transthoracic echocardiogram    TTE 02/26/2017: Study Conclusions  - Left ventricle: The cavity size was mildly dilated. Systolic function was severely reduced. The estimated ejection fraction was <20%. Diffuse hypokinesis. Regional wall motion abnormalities cannot be excluded. The study is not technically sufficient to allow evaluation of LV diastolic function. - Mitral valve: There was mild regurgitation. - Right ventricle: Systolic function was mildly reduced. - Pulmonary arteries: Systolic pressure could not be accurately estimated. - Inferior vena cava: The vessel was normal in size. The respirophasic diameter changes were in the normal range (>=  50%), consistent with normal central venous pressure.   Patient Profile     56 y.o. female with history of HTN, migraine disorder, and dependent edema who has had diarrhea for the past 2-3 days, taking up to 12 Imodium daily presented to [facility] with VF arrest. Cardiac cath showed no significant CAD.  Assessment & Plan    1. VF arrest: -Likely due to electrolyte abnormalities. Continue supportive care. EF improved to normal on subsequent echo. No plans for AICD .    2. Acute hypoxic respiratory failure: -  She is fully alert and following commands. Possible extubation today as he seems to be ready.  3. Acute systolic CHF:  Ejection fraction improved completely to normal on subsequent echo.  4. Cardiogenic shock: -Resolved  5. AKI: - Resolved  6. Mental status: much improved responsive and interactive. MRI no structural issues and EEG ok so prognosis seems a lot better at this time  Kathlyn [place], MD

## 2017-03-10 NOTE — Progress Notes (Signed)
[facility] Critical Care Medicine Progess Note     ASSESSMENT  ACUTE HYPOXIC RESPIRATORY FAILURE S/P CARDIAC ARREST ANEMIA ALTERED MENTAL STATUS HYPOMAGNESEMIA  PLAN Continue weaning from ventilator as tolerated. On BD. No significant improvement in CxR since yesterday - will scan chest. Cardiology following.  No indication for transfusion at this time. Electrolyte replacement as needed. Glycemic control. DVT/GI prophylaxis. Continue precedex gtt.  Daughter and husband updated at bedside. All questions and concerns addressed.  CC: 31 minutes  Miranda Gibbs MD    SUBJECTIVE:  Patient seen and examined at bedside. Noted events overnight. PS is being weaned and now on 10. Patient opens eyes and follows commands. Family at bedside.   VITAL SIGNS: Temp:  [98.1 F (36.7 C)-99.5 F (37.5 C)] 99.5 F (37.5 C) (09/03 1220) Pulse Rate:  [45-67] 63 (09/03 1100) Resp:  [17-34] 34 (09/03 1100) BP: (98-146)/(45-74) 146/62 (09/03 1100) SpO2:  [97 %-100 %] 99 % (09/03 1217) FiO2 (%):  [28 %] 28 % (09/03 1220) Weight:  [190 lb 14.7 oz (86.6 kg)] 190 lb 14.7 oz (86.6 kg) (09/03 0400)    PHYSICAL EXAMINATION: Physical Examination:   VS: BP (!) 146/62   Pulse 63   Temp 99.5 F (37.5 C) (Axillary)   Resp (!) 34   Ht 5\' 2"  (1.575 m)   Wt 190 lb 14.7 oz (86.6 kg)   SpO2 99%   BMI 34.92 kg/m    General Appearance: intubated but follows commands Neuro: no gross focal deficits HEENT: intubated Pulmonary: decreased air entry, no wheezing or crackles heard Cardiovascular : Normal S1,S2.  No m/r/g.   Abdomen: Benign, Soft, non-tender. Skin:   warm, no rashes, no ecchymosis  Extremities: normal, no cyanosis, clubbing.   LABORATORY PANEL:   CBC  Recent Labs Lab 03/10/17 0530  WBC 9.5  HGB 9.9*  HCT 29.2*  PLT 317    Chemistries   Recent Labs Lab 03/05/17 0404  03/10/17 0530  NA 145  < > 143  K 3.9  < > 4.2  CL 111  < > 107  CO2 27  < > 29  GLUCOSE 117*  <  > 133*  BUN 41*  < > 27*  CREATININE 0.54  < > 0.43*  CALCIUM 8.1*  < > 8.7*  MG  --   < > 1.9  PHOS  --   < > 4.1  AST 110*  --   --   ALT 169*  --   --   ALKPHOS 117  --   --   BILITOT 0.6  --   --   < > = values in this interval not displayed.   Recent Labs Lab 03/09/17 1542 03/09/17 1939 03/10/17 0017 03/10/17 0351 03/10/17 0732 03/10/17 1127  GLUCAP 109* 132* 129* 155* 148* 87    Recent Labs Lab 03/04/17 1750 03/06/17 1145 03/09/17 0906  PHART 7.41 7.48* 7.51*  PCO2ART 42 37 37  PO2ART 87 114* 91    Recent Labs Lab 03/05/17 0404  AST 110*  ALT 169*  ALKPHOS 117  BILITOT 0.6  ALBUMIN 2.3*    Cardiac Enzymes No results for input(s): TROPONINI in the last 168 hours.  RADIOLOGY:  Dg Chest Port 1 View  Result Date: 03/10/2017 CLINICAL DATA:  Acute respiratory failure, ventilatory support EXAM: PORTABLE CHEST 1 VIEW COMPARISON:  03/09/2017 FINDINGS: Endotracheal tube is 3.4 cm above the carina. NG tube within the stomach, tip not visualized. Right PICC line tip mid SVC level. Stable cardiomegaly  with vascular congestion and diffuse mixed airspace and interstitial opacities versus edema. No enlarging effusion or pneumothorax. Right peripheral mid lung nodular opacity remains indeterminate for superimposed shadows or lung nodule. Recommend attention on follow-up exams. IMPRESSION: Stable cardiomegaly with similar diffuse mixed interstitial and airspace opacities, suspicious for infection versus edema. No enlarging effusion or pneumothorax Possible right midlung peripheral lung nodule. Electronically Signed   By: Judie Petit.  Shick M.D.   On: 03/10/2017 09:29   Dg Chest Port 1 View  Result Date: 03/09/2017 CLINICAL DATA:  Respiratory failure.  Intubated. EXAM: PORTABLE CHEST 1 VIEW COMPARISON:  03/09/2015. FINDINGS: Endotracheal tube in satisfactory position. Nasogastric tube extending into the stomach. Right PICC tip in the inferior aspect of the superior vena cava. Stable  enlarged cardiac silhouette and bilateral interstitial opacities. Unremarkable bones. IMPRESSION: Stable cardiomegaly and interstitial lung disease. Electronically Signed   By: Beckie Salts M.D.   On: 03/09/2017 08:30   Dg Chest Port 1 View  Result Date: 03/08/2017 CLINICAL DATA:  Acute respiratory failure.  Intubated. EXAM: PORTABLE CHEST 1 VIEW COMPARISON:  Yesterday. FINDINGS: Endotracheal tube in satisfactory position. Stable enlarged cardiac silhouette. Stable diffuse prominence of the interstitial markings with no definite superimposed airspace opacity at this time. No pleural fluid. Nasogastric tube extending into the stomach. Unremarkable bones. IMPRESSION: Stable cardiomegaly and interstitial lung disease with no definite superimposed alveolar edema or pneumonia at this time. Electronically Signed   By: Beckie Salts M.D.   On: 03/08/2017 18:01          03/10/2017

## 2017-03-10 NOTE — Progress Notes (Signed)
CH made a follow up visit. Pt was being bathed. Husband stated that it has been a good weekend. Pt is awake and active. Pt is following commands. Pt has indicated she wants the breathing tube removed. Family is waiting on MD for test results and a decision. CH will attempt another visit with the Pt later in my rounds.    03/10/17 1000  Clinical Encounter Type  Visited With Family  Visit Type Follow-up  Consult/Referral To Chaplain

## 2017-03-10 NOTE — Plan of Care (Signed)
Problem: Activity: Goal: Ability to tolerate increased activity will improve Outcome: Not Met (add Reason) Pt remained intubated on ventilator  Problem: Coping: Goal: Level of anxiety will decrease Outcome: Not Met (add Reason) Pt requiring precedex for anxiety and comfort  Problem: Nutritional: Goal: Intake of prescribed amount of daily calories will improve Outcome: Progressing Pt on TF of high protein   Problem: Respiratory: Goal: Ability to maintain a clear airway and adequate ventilation will improve Outcome: Not Met (add Reason) Pt remained intubated  Problem: Neurologic: Goal: Promote progressive neurologic recovery Outcome: Progressing Pt is alert and oriented   

## 2017-03-10 NOTE — Progress Notes (Signed)
Subjective: Precedex and propofol is off, she is wide awake, she is responsive, and laughing at jokes. Significant improvement, family at bedside today (son, daughter, sister). Patient following most simple commands. She is very frustrated and would like to be extubated.  Objective: Current vital signs: BP (!) 105/49   Pulse (!) 56   Temp (!) 100.5 F (38.1 C) (Oral)   Resp 17   Ht _0  (1.575 m)   Wt 86.6 kg (190 lb 14.7 oz)   SpO2 99%   BMI 34.92 kg/m  Vital signs in last 24 hours: Temp:  [98.1 F (36.7 C)-100.5 F (38.1 C)] 100.5 F (38.1 C) (09/03 1700) Pulse Rate:  [45-66] 56 (09/03 1800) Resp:  [17-38] 17 (09/03 1800) BP: (98-146)/(45-86) 105/49 (09/03 1800) SpO2:  [96 %-100 %] 99 % (09/03 1800) FiO2 (%):  [28 %] 28 % (09/03 1800) Weight:  [86.6 kg (190 lb 14.7 oz)] 86.6 kg (190 lb 14.7 oz) (09/03 0400)  Intake/Output from previous day: 09/02 0701 - 09/03 0700 In: 2709.5 [I.V.:1369.5; NG/GT:1280] Out: 2265 [Urine:2020; Stool:245] Intake/Output this shift: No intake/output data recorded. Nutritional status:    Neuro: Alert, laughing to jokes, nodding yes/no, follows most simple commands Cranial Nerves:    The pupils are equal, round, and reactive to light. Visual fields are full to threat. Extraocular movements are intact. Trigeminal sensation is intact and the muscles of mastication are normal. The face appears symmetric. Hearing intact to voice.  Shoulder shrug is normal. The tongue appears midline but difficult due to ET tube. Motor Observation:    No asymmetry, no atrophy, and no involuntary movements noted. Tone:    Normal muscle tone.    Posture:    Posture is normal in bed, sitting up    Strength:    Moving legs spontaneously and briskly. Can lift both arms anti-gravity if prompted by daughter; responding better to commands to move arms, left arm appears weaker however moves arms equally and anti-gravity when prompted, may still be cognitive in etiology as  opposed to weakness.       Sensation: intact to LT     Reflex Exam:  DTR's:    Deep tendon reflexes in the upper and lower extremities are symmetrical bilaterally.    Lab Results: Basic Metabolic Panel:  Recent Labs Lab 03/04/17 0500  03/06/17 0435  03/07/17 0351 03/08/17 0539 03/08/17 1800 03/08/17 1841 03/09/17 0459 03/10/17 0530  NA 137  < > 147*  --  149* 152*  --  149* 144 143  K 4.5  < > 2.6*  < > 3.5 5.0  --  3.9 3.6 4.2  CL 105  < > 115*  --  113* 119*  --  115* 110 107  CO2 25  < > 26  --  30 27  --  _1 GLUCOSE 138*  < > 79  --  111* 237*  --  196* 232* 133*  BUN 36*  < > 27*  --  29* 43*  --  36* 33* 27*  CREATININE 0.67  < > 0.60  --  0.53 0.67  --  0.56 0.54 0.43*  CALCIUM 7.6*  < > 7.5*  --  7.6* 8.4*  --  8.7* 8.3* 8.7*  MG 2.6*  --  1.9  --  2.2 2.3 2.0  --  1.6* 1.9  PHOS 4.5  --  3.0  --  2.7  --   --   --  2.7 4.1  < > =  values in this interval not displayed.  Liver Function Tests:  Recent Labs Lab 03/05/17 0404  AST 110*  ALT 169*  ALKPHOS 117  BILITOT 0.6  PROT 6.6  ALBUMIN 2.3*   No results for input(s): LIPASE, AMYLASE in the last 168 hours.  Recent Labs Lab 03/05/17 0404  AMMONIA 44*    CBC:  Recent Labs Lab 03/08/17 0539 03/10/17 0530  WBC 7.0 9.5  HGB 9.0* 9.9*  HCT 26.4* 29.2*  MCV 90.7 91.6  PLT 226 317    Cardiac Enzymes: No results for input(s): CKTOTAL, CKMB, CKMBINDEX, TROPONINI in the last 168 hours.  Lipid Panel:  Recent Labs Lab 03/06/17 0435 03/07/17 1933  TRIG 329* 262*    CBG:  Recent Labs Lab 03/10/17 0017 03/10/17 0351 03/10/17 0732 03/10/17 1127 03/10/17 1600  GLUCAP 129* 155* 148* 2 117*    Microbiology: Results for orders placed or performed during the hospital encounter of 02/25/17  MRSA PCR Screening     Status: None   Collection Time: 02/26/17  1:36 AM  Result Value Ref Range Status   MRSA by PCR NEGATIVE NEGATIVE Final    Comment:        The GeneXpert MRSA Assay  (FDA approved for NASAL specimens only), is one component of a comprehensive MRSA colonization surveillance program. It is not intended to diagnose MRSA infection nor to guide or monitor treatment for MRSA infections.   Blood culture (routine x 2)     Status: None   Collection Time: 02/26/17  2:08 AM  Result Value Ref Range Status   Specimen Description BLOOD LEFT WRIST  Final   Special Requests   Final    BOTTLES DRAWN AEROBIC AND ANAEROBIC Blood Culture adequate volume   Culture NO GROWTH 5 DAYS  Final   Report Status 03/03/2017 FINAL  Final  Blood culture (routine x 2)     Status: None   Collection Time: 02/26/17  3:40 AM  Result Value Ref Range Status   Specimen Description BLOOD PORT  Final   Special Requests   Final    BOTTLES DRAWN AEROBIC AND ANAEROBIC Blood Culture adequate volume   Culture NO GROWTH 5 DAYS  Final   Report Status 03/03/2017 FINAL  Final  Urine Culture     Status: None   Collection Time: 02/28/17 10:18 AM  Result Value Ref Range Status   Specimen Description URINE, CATHETERIZED  Final   Special Requests Normal  Final   Culture   Final    NO GROWTH Performed at [facility], [place] [address], [place], [place] [address]    Report Status 03/01/2017 FINAL  Final  Culture, respiratory (NON-Expectorated)     Status: None   Collection Time: 02/28/17 10:45 AM  Result Value Ref Range Status   Specimen Description TRACHEAL ASPIRATE  Final   Special Requests NONE  Final   Gram Stain   Final    ABUNDANT WBC PRESENT,BOTH PMN AND MONONUCLEAR FEW SQUAMOUS EPITHELIAL CELLS PRESENT MODERATE GRAM POSITIVE COCCI FEW GRAM NEGATIVE RODS RARE GRAM POSITIVE RODS RARE GRAM NEGATIVE COCCI Performed at [facility], [place] [address] [place], [place] [address]    Culture ABUNDANT STAPHYLOCOCCUS AUREUS  Final   Report Status 03/02/2017 FINAL  Final   Organism ID, Bacteria STAPHYLOCOCCUS AUREUS  Final      Susceptibility   Staphylococcus aureus - MIC*     CIPROFLOXACIN <=0.5 SENSITIVE Sensitive     ERYTHROMYCIN <=0.25 SENSITIVE Sensitive  GENTAMICIN <=0.5 SENSITIVE Sensitive     OXACILLIN <=0.25 SENSITIVE Sensitive     TETRACYCLINE <=1 SENSITIVE Sensitive     VANCOMYCIN <=0.5 SENSITIVE Sensitive     TRIMETH/SULFA <=10 SENSITIVE Sensitive     CLINDAMYCIN <=0.25 SENSITIVE Sensitive     RIFAMPIN <=0.5 SENSITIVE Sensitive     Inducible Clindamycin NEGATIVE Sensitive     * ABUNDANT STAPHYLOCOCCUS AUREUS  C difficile quick scan w PCR reflex     Status: None   Collection Time: 03/04/17 11:40 AM  Result Value Ref Range Status   C Diff antigen NEGATIVE NEGATIVE Final   C Diff toxin NEGATIVE NEGATIVE Final   C Diff interpretation No C. difficile detected.  Final  Culture, bal-quantitative     Status: None   Collection Time: 03/07/17 12:37 PM  Result Value Ref Range Status   Specimen Description TRACHEAL ASPIRATE  Final   Special Requests Normal  Final   Gram Stain   Final    ABUNDANT WBC PRESENT, PREDOMINANTLY PMN NO ORGANISMS SEEN    Culture   Final    NO GROWTH 2 DAYS Performed at [facility], [address], [place], [place] [address]    Report Status 03/09/2017 FINAL  Final    Coagulation Studies: No results for input(s): LABPROT, INR in the last 72 hours.  Imaging: Ct Chest Wo Contrast  Result Date: 03/10/2017 CLINICAL DATA:  Acute respiratory illness EXAM: CT CHEST WITHOUT CONTRAST TECHNIQUE: Multidetector CT imaging of the chest was performed following the standard protocol without IV contrast. COMPARISON:  Radiograph 03/10/2017, CT chest 02/25/2017 FINDINGS: Cardiovascular: Limited evaluation without intravenous contrast. Right upper extremity catheter tip positioned at the cavoatrial junction. Mild atherosclerotic calcification. Minimal coronary artery calcification. Borderline to mild cardiomegaly. Negative for large pericardial effusion. Mediastinum/Nodes: Stable small mediastinal lymph nodes. Largest is seen in the  right paratracheal space and measures 9 mm. Endotracheal tube tip terminates just above the carina. Esophageal tube tip visualized to the mid stomach. Lungs/Pleura: Negative for pneumothorax or significant pleural effusion. Scattered bilateral ground-glass densities including a more focused area of consolidation in the peripheral right upper lobe, corresponding to the radiographic abnormality overall the lung aeration has improved since prior CT from 02/25/2017. Upper Abdomen: Surgical clips in the gallbladder fossa. No acute abnormality. Musculoskeletal: Motion artifact causes false appearance of lower and upper sternal fracture. Acute right fifth anterior rib fracture. IMPRESSION: 1. Mild to moderate scattered bilateral ground-glass densities, favored to represent multifocal pneumonia, overall the aeration of the lung fields is improved compared to the prior chest CT. 2. Endotracheal tube tip is just above the carina 3. Acute to subacute right fifth anterior rib fracture Aortic Atherosclerosis (ICD10-I70.0). Electronically Signed   By: Donavan Foil M.D.   On: 03/10/2017 14:21   Dg Chest Port 1 View  Result Date: 03/10/2017 CLINICAL DATA:  Acute respiratory failure, ventilatory support EXAM: PORTABLE CHEST 1 VIEW COMPARISON:  03/09/2017 FINDINGS: Endotracheal tube is 3.4 cm above the carina. NG tube within the stomach, tip not visualized. Right PICC line tip mid SVC level. Stable cardiomegaly with vascular congestion and diffuse mixed airspace and interstitial opacities versus edema. No enlarging effusion or pneumothorax. Right peripheral mid lung nodular opacity remains indeterminate for superimposed shadows or lung nodule. Recommend attention on follow-up exams. IMPRESSION: Stable cardiomegaly with similar diffuse mixed interstitial and airspace opacities, suspicious for infection versus edema. No enlarging effusion or pneumothorax Possible right midlung peripheral lung nodule. Electronically Signed   By: Jerilynn Mages.   Shick  M.D.   On: 03/10/2017 09:29   Dg Chest Port 1 View  Result Date: 03/09/2017 CLINICAL DATA:  Respiratory failure.  Intubated. EXAM: PORTABLE CHEST 1 VIEW COMPARISON:  03/09/2015. FINDINGS: Endotracheal tube in satisfactory position. Nasogastric tube extending into the stomach. Right PICC tip in the inferior aspect of the superior vena cava. Stable enlarged cardiac silhouette and bilateral interstitial opacities. Unremarkable bones. IMPRESSION: Stable cardiomegaly and interstitial lung disease. Electronically Signed   By: Claudie Revering M.D.   On: 03/09/2017 08:30    Medications:  I have reviewed the patient's current medications. Scheduled: . budesonide (PULMICORT) nebulizer solution  0.5 mg Nebulization BID  . chlorhexidine gluconate (MEDLINE KIT)  15 mL Mouth Rinse BID  . famotidine  20 mg Per Tube BID  . feeding supplement (PRO-STAT SUGAR FREE 64)  60 mL Per Tube BID  . feeding supplement (VITAL HIGH PROTEIN)  1,000 mL Per Tube Q24H  . free water  200 mL Per Tube Q8H  . heparin  5,000 Units Subcutaneous Q8H  . insulin aspart  0-20 Units Subcutaneous Q4H  . insulin aspart  3 Units Subcutaneous Q4H  . insulin glargine  20 Units Subcutaneous Daily  . ipratropium-albuterol  3 mL Nebulization Q6H  . mouth rinse  15 mL Mouth Rinse 10 times per day  . methylPREDNISolone (SOLU-MEDROL) injection  20 mg Intravenous Q12H  . multivitamin  15 mL Per Tube Daily  . oxyCODONE  2.5 mg Oral TID  . QUEtiapine  50 mg Per Tube QHS  . sodium chloride flush  10-40 mL Intracatheter Q12H    Assessment/Plan: Patient with significant improvement today.  Alert, laughing to jokes, nodding yes/no, follows most simple commands. Today responding to commands to move arms, but left arm appears weaker however moves arms equally and anti-gravity when prompted, may be cognitive in etiology as opposed to weakness. Will continue to follow and when extubated may consider repeat MRI brain.  Will continue to follow with  you.     LOS: 12 days   Sarina Ill, MD Neurology [phone] 03/10/2017  7:52 PM

## 2017-03-10 NOTE — Progress Notes (Signed)
Pt alert and oriented to person, place, and some recent events. Nods yes/no to questions asked. Follows all commands. CAM ICU positive for disorganized thinking. Family members at bedside.On Vent: 28% fiO2/ 5 peep/10ps. Lungs clear and diminished. Small amont white ETT secretions. She denies pain. NSR on monitor.Tolerated hair wash and  dry, bath and linen change. She is able to pick head up off pillow. Has strong cough effort. CT chest w/o contrast was done. She tolerated that trip and transfer very well. Precedex at 0.56mcg all day. Tube feed at goal. Flexiseal with liquid brown green stool. UOP good. Dr Chales Abrahams has spent time with pt and family discussing plan of care and CT results. They are hopeful for extubation tomorrow. Family members and friends at bedside all day.

## 2017-03-10 NOTE — Progress Notes (Signed)
[facility] - [place] [facility]   PATIENT NAME: Miranda [name]    MR#:  [id number]  DATE OF BIRTH:  [birth year]  SUBJECTIVE:   Intubated,sedated and on the vent .  tapered sedation,  More calm and follows simple commands today. Respond by noding her head and facial expressions. Following commands and communications.  REVIEW OF SYSTEMS:   Review of Systems  Unable to perform ROS: Intubated   DRUG ALLERGIES:   Allergies  Allergen Reactions  . Ace Inhibitors   . Compazine [Prochlorperazine Edisylate]   . Lisinopril   . Penicillins     Has patient had a PCN reaction causing immediate rash, facial/tongue/throat swelling, SOB or lightheadedness with hypotension: Unknown Has patient had a PCN reaction causing severe rash involving mucus membranes or skin necrosis: Unknown Has patient had a PCN reaction that required hospitalization: Unknown Has patient had a PCN reaction occurring within the last 10 years: Unknown If all of the above answers are "NO", then may proceed with Cephalosporin use.     VITALS:  Blood pressure (!) 118/52, pulse (!) 55, temperature 99.3 F (37.4 C), temperature source Axillary, resp. rate (!) 23, height 5\' 2"  (1.575 m), weight 86.6 kg (190 lb 14.7 oz), SpO2 99 %.  PHYSICAL EXAMINATION:   Physical Exam  GENERAL:  56 y.o.-year-old patient lying in the bed with no acute distress.  EYES: Pupils equal, round, reactive to light . No scleral icterus. Extraocular muscles intact.  HEENT: Head atraumatic, normocephalic. Oropharynx and nasopharynx clear. Intubated NECK:  Supple, no jugular venous distention. No thyroid enlargement, no tenderness, NG tube.  LUNGS: Normal breath sounds bilaterally, no wheezing, rales, rhonchi. No use of accessory muscles of respiration.  CARDIOVASCULAR: S1, S2 normal. No murmurs, rubs, or gallops.  ABDOMEN: Soft, nontender, nondistended. Bowel sounds feeble. No organomegaly or mass.  EXTREMITIES: No  cyanosis, clubbing or edema b/l.    NEUROLOGIC: Intubated on the ventilator , but looks and respond by noding her head. Moves limbs to simple commands. Was trying to write with a pen on a paper, but could not write any letter. PSYCHIATRIC:   intubated on the ventilator , without sedation, calm and follows simple commands. SKIN: No obvious rash, lesion, or ulcer.   LABORATORY PANEL:  CBC  Recent Labs Lab 03/10/17 0530  WBC 9.5  HGB 9.9*  HCT 29.2*  PLT 317    Chemistries   Recent Labs Lab 03/05/17 0404  03/10/17 0530  NA 145  < > 143  K 3.9  < > 4.2  CL 111  < > 107  CO2 27  < > 29  GLUCOSE 117*  < > 133*  BUN 41*  < > 27*  CREATININE 0.54  < > 0.43*  CALCIUM 8.1*  < > 8.7*  MG  --   < > 1.9  AST 110*  --   --   ALT 169*  --   --   ALKPHOS 117  --   --   BILITOT 0.6  --   --   < > = values in this interval not displayed. Cardiac Enzymes No results for input(s): TROPONINI in the last 168 hours. RADIOLOGY:  Ct Chest Wo Contrast  Result Date: 03/10/2017 CLINICAL DATA:  Acute respiratory illness EXAM: CT CHEST WITHOUT CONTRAST TECHNIQUE: Multidetector CT imaging of the chest was performed following the standard protocol without IV contrast. COMPARISON:  Radiograph 03/10/2017, CT chest 02/25/2017 FINDINGS: Cardiovascular: Limited evaluation without intravenous contrast. Right upper extremity  catheter tip positioned at the cavoatrial junction. Mild atherosclerotic calcification. Minimal coronary artery calcification. Borderline to mild cardiomegaly. Negative for large pericardial effusion. Mediastinum/Nodes: Stable small mediastinal lymph nodes. Largest is seen in the right paratracheal space and measures 9 mm. Endotracheal tube tip terminates just above the carina. Esophageal tube tip visualized to the mid stomach. Lungs/Pleura: Negative for pneumothorax or significant pleural effusion. Scattered bilateral ground-glass densities including a more focused area of consolidation in the  peripheral right upper lobe, corresponding to the radiographic abnormality overall the lung aeration has improved since prior CT from 02/25/2017. Upper Abdomen: Surgical clips in the gallbladder fossa. No acute abnormality. Musculoskeletal: Motion artifact causes false appearance of lower and upper sternal fracture. Acute right fifth anterior rib fracture. IMPRESSION: 1. Mild to moderate scattered bilateral ground-glass densities, favored to represent multifocal pneumonia, overall the aeration of the lung fields is improved compared to the prior chest CT. 2. Endotracheal tube tip is just above the carina 3. Acute to subacute right fifth anterior rib fracture Aortic Atherosclerosis (ICD10-I70.0). Electronically Signed   By: Jasmine Pang M.D.   On: 03/10/2017 14:21   Dg Chest Port 1 View  Result Date: 03/10/2017 CLINICAL DATA:  Acute respiratory failure, ventilatory support EXAM: PORTABLE CHEST 1 VIEW COMPARISON:  03/09/2017 FINDINGS: Endotracheal tube is 3.4 cm above the carina. NG tube within the stomach, tip not visualized. Right PICC line tip mid SVC level. Stable cardiomegaly with vascular congestion and diffuse mixed airspace and interstitial opacities versus edema. No enlarging effusion or pneumothorax. Right peripheral mid lung nodular opacity remains indeterminate for superimposed shadows or lung nodule. Recommend attention on follow-up exams. IMPRESSION: Stable cardiomegaly with similar diffuse mixed interstitial and airspace opacities, suspicious for infection versus edema. No enlarging effusion or pneumothorax Possible right midlung peripheral lung nodule. Electronically Signed   By: Judie Petit.  Shick M.D.   On: 03/10/2017 09:29   Dg Chest Port 1 View  Result Date: 03/09/2017 CLINICAL DATA:  Respiratory failure.  Intubated. EXAM: PORTABLE CHEST 1 VIEW COMPARISON:  03/09/2015. FINDINGS: Endotracheal tube in satisfactory position. Nasogastric tube extending into the stomach. Right PICC tip in the inferior  aspect of the superior vena cava. Stable enlarged cardiac silhouette and bilateral interstitial opacities. Unremarkable bones. IMPRESSION: Stable cardiomegaly and interstitial lung disease. Electronically Signed   By: Beckie Salts M.D.   On: 03/09/2017 08:30   ASSESSMENT AND PLAN:   Ms. Garciagarcia reportedly had diarrhea over the last 2-3 days for which she was taking up to 12 Imodium per day. She was trying to stay well hydrated and had also stopped taking her blood pressure medications on account of her GI illness. Around 9 PM, Ms. Straker's husband found the patient seated on the toilet. She attempted to stand up but passed out.  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 Severely reduced LVEF less than <20%  -repeat echo showed EF of 60-65% -Patient was on IV levophed,Milrinone- improved now. - Was IV propofol -Patient was  on hypothermia protocol -CT chest negative for PE -CXR shows pulmonary edema. Received Lasix - Now off sedation,and waiting to do extubation , when allowed by ICU team. - Xray shows still multilobar pneumonia, CT chest done.  *Acute renal failure secondary to #1 -making good urine -Creatinine stable  *Febrile illness- multilobar pneumonia -Low-grade fever, tachycardia elevated white count of 40,000--- 19K -Blood cultures so far negative -Empiric IV vancomycin and Zosyn- changed to rocephin -  C. Difficile negative -chest x-ray shows diffuse pulmonary infiltrate - Had bronch done 03/07/17- 10 ml cloudy thick secretion suctioned from right Middle lobe. Sent for culture.- negative so far.  - Sputum cx on 8/24 have Pansensitive staph.  *altered mental status -Patient currently has periods of agitation  -Seen by Dr. Thad Ranger -- neurology -Recommend EEG and brain MRI. - No acute injuries. Taper sedation slowly. - likely due to sedative meds. - more  calm and following commands today.  *Metabolic acidosis secondary to #1  *Hyperglycemia -Patient currently off insulin drip  * Nutrition -started on Tube feeding  *DVT prophylaxis subcutaneous heparin  . Case discussed with Care Management/Social Worker. Management plans discussed with the  family and they are in agreement.  CODE STATUS: Full  DVT Prophylaxis: Subcutaneous heparin  TOTAL CRITICAL TIME TAKING CARE OF THIS PATIENT: 30 minutes.  >50% time spent on counselling and coordination of care Spoke to her husband in room today.  Note: This dictation was prepared with Dragon dictation along with smaller phrase technology. Any transcriptional errors that result from this process are unintentional.  Altamese Dilling M.D on 03/10/2017 at 9:24 PM  Between 7am to 6pm - Pager - [phone]  After 6pm go to [email or web address] - Social research officer, government  [facility]  Office  [phone]  CC: Primary care physician; Merwyn Katos, MD

## 2017-03-10 NOTE — Progress Notes (Signed)
Inpatient Rehabilitation  I have assumed following this case.  I will plan to follow at a distance for timing of extubation and IP Rehab consult order.  Please call with questions.   Charlane Ferretti., CCC/SLP Admission Coordinator  [facility]patient [facility]  Cell [phone]

## 2017-03-10 NOTE — Evaluation (Signed)
Occupational Therapy Evaluation Patient Details Name: Miranda [name] MRN: [id number] DOB: 04-25-1961 Today's Date: 03/10/2017    History of Present Illness 56yo female pt presented to ER after unresponsive episode/cardiac arrest in home; admitted for hospitalization secondary to cardiac arrest with ventricular fibrillation (requiring shock x3 prior to ROSC), status post hypothermia protocol (initiated 8/21, rewarming initiated 8/25). Underwent emergent cardiac cath upon arrival to facility, with no significant disease noted, but with significantly reduced EF (15%).  Currently intubated (failed initial intubation 8/25 requiring reintubation within minutes) with full support; however, alert and following some simple commands.  MRI negative for acute insult, but some discussion of possible anoxic brain injury noted in chart.    Clinical Impression   Pt seen for OT evaluation this date. Pt was independent prior to admission, working full time. Family at bedside during evaluation, very supportive. Upon evaluation, pt alert initially with increasing fatigue noted (spouse reports pt just had bath from nursing before OT came). Pt able to follow simple commands with verbal cues approx 75% of the time (suspect this is more due to fatigue at this time). Pt is globally weak and deconditioned from extended hospitalization/illness, but able to demonstrate AROM of all joints bilaterally at least 3-/5, weaker proximally. No significant unilateral strength or sensory deficit noted. Pt able to visually track therapist and family members around the room and responds with head nods to yes/no questions with additional time. Mobility deferred due to pt fatigue this date. Pt will benefit from skilled OT services to address noted impairments and functional deficits in all aspects of self care and mobility in order to maximize return to independent PLOF. Recommend transition to acute inpatient rehab upon discharge for high-intensity,  post-acute rehab services once medically appropriate.     Follow Up Recommendations  CIR    Equipment Recommendations       Recommendations for Other Services       Precautions / Restrictions Precautions Precautions: Fall Precaution Comments: NG tube, mechanical ventilation, rectal tube, R PICC Restrictions Weight Bearing Restrictions: No      Mobility Bed Mobility               General bed mobility comments: deferred secondary to ventilator and patient fatigue after bathing  Transfers                 General transfer comment: deferred secondary to ventilator and patient fatigue after bathing    Balance                                           ADL either performed or assessed with clinical judgement   ADL Overall ADL's : Needs assistance/impaired Eating/Feeding: NPO Eating/Feeding Details (indicate cue type and reason): intubated Grooming: Wash/dry face;Set up;Bed level Grooming Details (indicate cue type and reason): with set up, pt washed approx 50% of her face with washcloth, did not attempt to reach/wash forehead. Upper Body Bathing: Maximal assistance;Bed level   Lower Body Bathing: Bed level;Total assistance   Upper Body Dressing : Maximal assistance;Bed level   Lower Body Dressing: Bed level;Total assistance   Toilet Transfer: Total assistance                   Vision Baseline Vision/History: No visual deficits Patient Visual Report:  (pt unable to verbalize) Vision Assessment?: No apparent visual deficits Additional Comments: unable to fully assess due to  fatigue, pt able to track therapist around room without difficulty     Perception     Praxis      Pertinent Vitals/Pain Pain Assessment: Faces Faces Pain Scale: No hurt Pain Location: when asked, pt nodded head "no" Pain Intervention(s): Monitored during session     Hand Dominance Right   Extremity/Trunk Assessment Upper Extremity Assessment Upper  Extremity Assessment: Generalized weakness (difficult to fully assess due to fatigue/lethargy, but grossly 3-/5 bilaterally in all extremities, weaker proximally vs distally)   Lower Extremity Assessment Lower Extremity Assessment: Generalized weakness (difficult to fully assess due to fatigue/lethargy, but grossly 3-/5 bilaterally in all extremities, weaker proximally vs distally)       Communication Communication Communication: Other (comment) (intubated, can nod yes/no, some facial expressions)   Cognition Arousal/Alertness: Lethargic Behavior During Therapy: WFL for tasks assessed/performed Overall Cognitive Status: Difficult to assess                                 General Comments: nods head yes/no appropriately, requires verbal cues to improve alertness as pt very fatigued/lethargic, husband notes pt just had bath; with additional time (processing vs. initiation) pt able to raise fingers to answer 2+2 and 1+1, became too fatigued and did not respond to 3-2 with additional VC/time   General Comments       Exercises     Shoulder Instructions      Home Living Family/patient expects to be discharged to:: Private residence Living Arrangements: Spouse/significant other Available Help at Discharge: Family Type of Home: Apartment Home Access: Level entry     Home Layout: One level     Bathroom Shower/Tub: Chief Strategy Officer: Standard     Home Equipment: None          Prior Functioning/Environment Level of Independence: Independent        Comments: Indep with ADLs, household and community mobilization; working full-time (6 nights, 12-hour shifts, 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        OT Problem List: Decreased strength;Decreased cognition;Decreased activity tolerance;Impaired balance (sitting and/or standing);Decreased knowledge of use of DME or AE      OT Treatment/Interventions: Self-care/ADL training;Therapeutic  exercise;DME and/or AE instruction;Patient/family education    OT Goals(Current goals can be found in the care plan section) Acute Rehab OT Goals Patient Stated Goal: per family, to participate as able OT Goal Formulation: With patient/family Time For Goal Achievement: 03/24/17 Potential to Achieve Goals: Good  OT Frequency: Min 3X/week   Barriers to D/C:            Co-evaluation              AM-PAC PT "6 Clicks" Daily Activity     Outcome Measure Help from another person eating meals?: Total Help from another person taking care of personal grooming?: A Little Help from another person toileting, which includes using toliet, bedpan, or urinal?: Total Help from another person bathing (including washing, rinsing, drying)?: Total Help from another person to put on and taking off regular upper body clothing?: A Lot Help from another person to put on and taking off regular lower body clothing?: Total 6 Click Score: 9   End of Session    Activity Tolerance: Patient limited by fatigue;Patient limited by lethargy Patient left: in bed;with call bell/phone within reach;with bed alarm set;with family/visitor present  OT Visit Diagnosis: Other abnormalities of gait and mobility (  R26.89);Muscle weakness (generalized) (M62.81)                Time: 1610-9604 OT Time Calculation (min): 13 min Charges:  OT Evaluation $OT Eval Moderate Complexity: 1 Mod G-Codes: OT G-codes **NOT FOR INPATIENT CLASS** Functional Assessment Tool Used: AM-PAC 6 Clicks Daily Activity;Clinical judgement Functional Limitation: Self care Self Care Current Status (V4098): At least 80 percent but less than 100 percent impaired, limited or restricted Self Care Goal Status (J1914): At least 40 percent but less than 60 percent impaired, limited or restricted   Richrd Prime, MPH, MS, OTR/L ascom [phone] 03/10/17, 11:20 AM

## 2017-03-11 LAB — GLUCOSE, CAPILLARY
GLUCOSE-CAPILLARY: 123 mg/dL — AB (ref 65–99)
GLUCOSE-CAPILLARY: 104 mg/dL — AB (ref 65–99)
Glucose-Capillary: 107 mg/dL — ABNORMAL HIGH (ref 65–99)
Glucose-Capillary: 61 mg/dL — ABNORMAL LOW (ref 65–99)
Glucose-Capillary: 64 mg/dL — ABNORMAL LOW (ref 65–99)
Glucose-Capillary: 104 mg/dL — ABNORMAL HIGH (ref 65–99)
Glucose-Capillary: 87 mg/dL (ref 65–99)

## 2017-03-11 LAB — BASIC METABOLIC PANEL
Anion gap: 17 — ABNORMAL HIGH (ref 5–15)
BUN: 30 mg/dL — AB (ref 6–20)
CO2: 25 mmol/L (ref 22–32)
CREATININE: 0.34 mg/dL — AB (ref 0.44–1.00)
Calcium: 8.1 mg/dL — ABNORMAL LOW (ref 8.9–10.3)
Chloride: 99 mmol/L — ABNORMAL LOW (ref 101–111)
GFR calc Af Amer: >60 mL/min (ref >60)
GFR calc non Af Amer: >60 mL/min (ref >60)
GLUCOSE: 299 mg/dL — AB (ref 65–99)
Potassium: 3.6 mmol/L (ref 3.5–5.1)
SODIUM: 141 mmol/L (ref 135–145)

## 2017-03-11 LAB — PHOSPHORUS
Phosphorus: 16.7 mg/dL — ABNORMAL HIGH (ref 2.5–4.6)
Phosphorus: 4.1 mg/dL (ref 2.5–4.6)

## 2017-03-11 LAB — MAGNESIUM: MAGNESIUM: 1.7 mg/dL (ref 1.7–2.4)

## 2017-03-11 MED ORDER — DEXTROSE 50 % IV SOLN
25.0000 mL | Freq: Once | INTRAVENOUS | Status: AC
  Administered 2017-03-11: 25 mL via INTRAVENOUS

## 2017-03-11 MED ORDER — ENOXAPARIN SODIUM 40 MG/0.4ML ~~LOC~~ SOLN
40.0000 mg | SUBCUTANEOUS | Status: DC
  Administered 2017-03-11 – 2017-03-13 (×3): 40 mg via SUBCUTANEOUS
  Filled 2017-03-11 (×3): qty 0.4

## 2017-03-11 MED ORDER — ORAL CARE MOUTH RINSE: 15.0000 mL | Freq: Two times a day (BID) | OROMUCOSAL | Status: DC

## 2017-03-11 MED ORDER — POTASSIUM CHLORIDE 20 MEQ PO PACK
20.0000 meq | PACK | ORAL | Status: DC
  Administered 2017-03-11: 20 meq
  Filled 2017-03-11: qty 1

## 2017-03-11 MED ORDER — DEXTROSE 50 % IV SOLN
INTRAVENOUS | Status: AC
  Administered 2017-03-11: 25 mL via INTRAVENOUS
  Filled 2017-03-11: qty 50

## 2017-03-11 MED ORDER — CHLORHEXIDINE GLUCONATE 0.12 % MT SOLN: 15.0000 mL | Freq: Two times a day (BID) | OROMUCOSAL | Status: DC

## 2017-03-11 NOTE — Progress Notes (Signed)
Physical Therapy Treatment Patient Details Name: Miranda [name] MRN: [id number] DOB: December 14, 1960 Today's Date: 03/11/2017    History of Present Illness 56 yo female pt presented to ER after unresponsive episode/cardiac arrest in home; admitted for hospitalization secondary to cardiac arrest with ventricular fibrillation (requiring shock x3 prior to ROSC), status post hypothermia protocol (initiated 8/21, rewarming initiated 8/25). Underwent emergent cardiac cath upon arrival to facility, with no significant disease noted, but with significantly reduced EF (15%).  Failed initial extubation 8/25 requiring reintubation within minutes but successfully extubated AM 03/11/17.  MRI negative for acute insult, but some discussion of possible anoxic brain injury noted in chart.     PT Comments    Pt extubated this morning and eager to get OOB and move.  Pt initially demonstrating posterior lean sitting on edge of bed but then with intermittent posterior lean after that requiring assist to correct.  Performed multiple sit to/from stands requiring initially max then mod assist with B knees blocked to prevent knee buckling.  Able to transfer to [facility] with mod assist stand pivot (pt unable to take steps d/t B LE weakness requiring B knees blocked).  Pt appearing very motivated to participate in PT and appeared to tolerate therapy session well.  Some impulsiveness and difficulty following directions noted intermittently but able to re-direct for activities.  Pt appears to be an excellent candidate for acute inpatient rehab.  Will continue to progress pt with strengthening, balance, and progressive functional mobility per pt tolerance.    Follow Up Recommendations  CIR     Equipment Recommendations  Rolling walker with 5" wheels    Recommendations for Other Services       Precautions / Restrictions Precautions Precautions: Fall Precaution Comments: R PICC Restrictions Weight Bearing Restrictions: No     Mobility  Bed Mobility Overal bed mobility: Needs Assistance Bed Mobility: Supine to Sit     Supine to sit: Mod assist;Max assist     General bed mobility comments: vc's for technique; logrolling to R side; assist for LE's and trunk supine to sit; increased effort to perform  Transfers Overall transfer level: Needs assistance Equipment used: None Transfers: Sit to/from [employer] Transfers Sit to Stand: Max assist;Mod assist Stand pivot transfers: Mod assist       General transfer comment: pt initially requiring max assist to stand with B knees blocked; 2nd and 3rd attempt pt requiring mod assist with B knees blocked; pt putting her head against therapist and holding onto therapist during standing requiring cueing for technique and upright posture (improved with repetition); mod assist stand pivot bed to [facility] (2nd assist to hold onto [facility] for safety)  Ambulation/Gait             General Gait Details: not appropriate at this time d/t B LE weakness (knees requiring blocking to prevent buckling)   Stairs            Wheelchair Mobility    Modified Rankin (Stroke Patients Only)       Balance Overall balance assessment: Needs assistance   Sitting balance-Leahy Scale: Poor Sitting balance - Comments: pt with initial and then intermittent posterior lean requiring min to mod assist to correct   Standing balance support: Bilateral upper extremity supported Standing balance-Leahy Scale: Poor Standing balance comment: pt unsteady in standing requiring min to mod assist to maintain standing  Cognition Arousal/Alertness: Awake/alert Behavior During Therapy: Impulsive (Eager to move) Overall Cognitive Status:  (Oriented to person, place, situation)                                        Exercises      General Comments   Nursing cleared pt for participation in physical therapy.  Pt agreeable to PT  session.  Pt's sister and daughter present during session and very supportive of pt.      Pertinent Vitals/Pain Pain Assessment: No/denies pain  Vitals (HR and O2 on 2 L via nasal cannula) stable and WFL throughout treatment session.  No c/o dizziness/lightheadedness during session.    Home Living                      Prior Function            PT Goals (current goals can now be found in the care plan section) Acute Rehab PT Goals Patient Stated Goal: to get stronger and more independent PT Goal Formulation: With patient Time For Goal Achievement: 03/25/17 Potential to Achieve Goals: Good Progress towards PT goals: Progressing toward goals    Frequency    7X/week      PT Plan Frequency needs to be updated    Co-evaluation              AM-PAC PT "6 Clicks" Daily Activity  Outcome Measure  Difficulty turning over in bed (including adjusting bedclothes, sheets and blankets)?: Unable Difficulty moving from lying on back to sitting on the side of the bed? : Unable Difficulty sitting down on and standing up from a chair with arms (e.g., wheelchair, bedside commode, etc,.)?: Unable Help needed moving to and from a bed to chair (including a wheelchair)?: A Lot Help needed walking in hospital room?: Total Help needed climbing 3-5 steps with a railing? : Total 6 Click Score: 7    End of Session Equipment Utilized During Treatment: Gait belt;Oxygen (2 L O2 via nasal cannula) Activity Tolerance: Patient tolerated treatment well Patient left:  (sitting on BSC with OT present) Nurse Communication: Mobility status;Precautions PT Visit Diagnosis: Muscle weakness (generalized) (M62.81);Difficulty in walking, not elsewhere classified (R26.2);Other abnormalities of gait and mobility (R26.89)     Time: 6962-9528 PT Time Calculation (min) (ACUTE ONLY): 42 min  Charges:  $Therapeutic Activity: 38-52 mins                    G CodesHendricks Limes,  PT 03/11/17, 4:26 PM [phone]

## 2017-03-11 NOTE — Progress Notes (Signed)
[facility] ADULT ICU REPLACEMENT PROTOCOL FOR AM LAB REPLACEMENT ONLY  The patient does apply for the [facility] Adult ICU Electrolyte Replacment Protocol based on the criteria listed below:   1. Is GFR >/= 40 ml/min? Yes.    Patient's GFR today is >60 2. Is urine output >/= 0.5 ml/kg/hr for the last 6 hours? Yes.   Patient's UOP is 2 ml/kg/hr 3. Is BUN < 60 mg/dL? Yes.    Patient's BUN today is 30 4. Abnormal electrolyte(s): 3.6 5. Ordered repletion with: per protocol 6. If a panic level lab has been reported, has the CCM MD in charge been notified? Yes.  .   Physician:  Dr. [name], Dixon Boos 03/11/2017 6:53 AM

## 2017-03-11 NOTE — Progress Notes (Signed)
CH made a follow up visit with PT and her family. PT is alert after 14 days in ICU. Prayer was offered by [facility]. Also [facility] wa   03/11/17 1610  Clinical Encounter Type  Visited With Patient and family together  Visit Type Follow-up  Referral From Nurse  Consult/Referral To Chaplain  Spiritual Encounters  Spiritual Needs Prayer;Emotional  s present.

## 2017-03-11 NOTE — Progress Notes (Signed)
Hypoglycemic Event  CBG: 64  Treatment: D50 IV 25 mL  Symptoms: None  Follow-up CBG: Time:1215 CBG Result:104  Possible Reasons for Event: Unknown  Comments/MD notified:25 ml D50 administered per ICU hypoglycemia protocol.  D5W at 30 ml/hr was infusing at the time of event.  Pt to have SLP eval today and start a diet if appropriate    Claude Manges

## 2017-03-11 NOTE — Progress Notes (Signed)
Nutrition Follow-up  DOCUMENTATION CODES:   Obesity unspecified  INTERVENTION:  1. Provide oral nutrition supplements with diet advancement as needed  NUTRITION DIAGNOSIS:   Inadequate oral intake related to inability to eat as evidenced by NPO status. -awaiting diet advancement  GOAL:   Patient will meet greater than or equal to 90% of their needs -not meeting currently  MONITOR:   PO intake, I & O's, Labs, Supplement acceptance, Weight trends, Diet advancement   ASSESSMENT:   56 year old female with PMHx of HTN, migraines, edema to bilateral legs. Patient had diarrhea for 2-3 days PTA and was taking up to 12 imodium per day, staying well-hydrated, and stopped taking antihypertensives. She suffered V-fib arrest at home, was intubated in the field after ROSC.  Extubated, resting at bedside with family. She is hungry. No complaints outside of throat soreness. Now DNR/DNI  Labs reviewed:  CBGs 64, 61, 107  Medications reviewed and include:  Novolog 0-20 Units every 4 hours  Diet Order:     Skin:  Reviewed, no issues  Last BM:  03/11/2017 (type 7)  Height:   Ht Readings from Last 1 Encounters:  03/07/17 5\' 2"  (1.575 m)    Weight:   Wt Readings from Last 1 Encounters:  03/11/17 191 lb 5.8 oz (86.8 kg)    Ideal Body Weight:  50 kg  BMI:  Body mass index is 35 kg/m.  Estimated Nutritional Needs:   Kcal:  1500-1776 calories (ABW x25-30)  Protein:  104-122 grams  Fluid:  1.5-1.75L  EDUCATION NEEDS:   No education needs identified at this time  Dionne Ano. Karilynn Carranza, MS, RD LDN Inpatient Clinical Dietitian Pager [phone]

## 2017-03-11 NOTE — Progress Notes (Signed)
Pt and pt family does not want pt to have CPT throughout the night. RN at bedside and aware of request.

## 2017-03-11 NOTE — Progress Notes (Signed)
Patient sedation turned off patient is alert and awake following commands patient placed on pressure support mode was able to take big deep breast with increased tidal volumes  Patient is alert and awake enough to answer yes or no questions and understands her situation at this time I asked the patient if she would like to be reintubated if she were to be extubated and she nodded no the family has concurred the patient is a DNR/DNI and will plan for trial of extubation today   Patient successfully extubated this morning patient is alert and awake in no apparent distress no stridor    Wallis Bamberg Santiago Glad, M.D.  [facility]  Medical Director [facility] Medical Director [facility] Cardio-Pulmonary Department

## 2017-03-11 NOTE — Progress Notes (Signed)
Chaplain made a follow up visit with pt. Pt's daughter and sister were at bedside. Pt's extubated, was alert and talking, although she did not verbalized the words, she seemed to be making good progress toward full recovery. Other chaplains were present with th pt, CH offered a thanksgiving prayer for the pt and for the family. CH will be available to provided pastoral care as needed.    03/11/17 1000  Clinical Encounter Type  Visited With Patient;Patient and family together;Health care provider (Simultaneous filing. User may not have seen previous data.)  Visit Type Follow-up (Simultaneous filing. User may not have seen previous data.)  Referral From Nurse  Consult/Referral To Chaplain (Simultaneous filing. User may not have seen previous data.)  Spiritual Encounters  Spiritual Needs Prayer (Simultaneous filing. User may not have seen previous data.)

## 2017-03-11 NOTE — Progress Notes (Signed)
Occupational Therapy Treatment Patient Details Name: Miranda [name] MRN: [id number] DOB: 16-Jul-1960 Today's Date: 03/11/2017    History of present illness 56 yo female pt presented to ER after unresponsive episode/cardiac arrest in home; admitted for hospitalization secondary to cardiac arrest with ventricular fibrillation (requiring shock x3 prior to ROSC), status post hypothermia protocol (initiated 8/21, rewarming initiated 8/25). Underwent emergent cardiac cath upon arrival to facility, with no significant disease noted, but with significantly reduced EF (15%).  Failed initial extubation 8/25 requiring reintubation within minutes but successfully extubated AM 03/11/17.  MRI negative for acute insult, but some discussion of possible anoxic brain injury noted in chart.    OT comments  Pt seen for OT treatment this date, following PT treatment session. Pt on BSC at start of session, leaning with UE on armrest of BSC. Daughter and sister combing her hair due to significant tangles and pt's decreased activity tolerance for completing task herself. Pt in good spirits, extubated as of this morning and tolerating it very well. On 2L O2 via nasal cannula and VSS throughout session. Pt requiring mod assist x1 + min assist x1 for safety to perform sit>stand from [facility] and verbal cues to adjust standing posture to minimize posterior lean and improve balance. Tolerated standing for approx 1 minute during max assist hygiene. Pt very motivated to participate in therapy this date and supportive family members present for session. Will benefit from continued OT to address current goals. CIR remains appropriate.    Follow Up Recommendations  CIR    Equipment Recommendations  3 in 1 bedside commode    Recommendations for Other Services      Precautions / Restrictions Precautions Precautions: Fall Precaution Comments: R PICC Restrictions Weight Bearing Restrictions: No       Mobility Bed Mobility      General bed mobility comments: deferred, pt on BSC at start of session, in recliner at end of session.  Transfers Overall transfer level: Needs assistance Equipment used: None Transfers: Sit to/from Stand Sit to Stand: Mod assist;+2 safety/equipment Stand pivot transfers: Mod assist       General transfer comment: mod assist + min guard for safety due to poor balance and endurance to stand for approx 1 minute while dtr performed hygiene after toileting task. Recliner brought up behind pt to sit.    Balance Overall balance assessment: Needs assistance Sitting-balance support: Single extremity supported;Feet supported Sitting balance-Leahy Scale: Poor Sitting balance - Comments: pt with initial and then intermittent posterior lean requiring min to mod assist to correct   Standing balance support: Bilateral upper extremity supported;During functional activity Standing balance-Leahy Scale: Poor Standing balance comment: pt unsteady in standing requiring min to mod assist to maintain standing                           ADL either performed or assessed with clinical judgement   ADL Overall ADL's : Needs assistance/impaired Eating/Feeding: Minimal assistance;Set up;Bed level Eating/Feeding Details (indicate cue type and reason): extubated, daughter reports pt attempted to feed herself earlier with some difficulty "finding her mouth" - encouraged pt to attempt to self feed again this afternoon/evening   Grooming Details (indicate cue type and reason): pt's daughter and sister brushed hair due to large tangles and pt's poor activity tolerance/strength for task. Upper Body Bathing: Moderate assistance;Sitting   Lower Body Bathing: Maximal assistance;Sitting/lateral leans   Upper Body Dressing : Minimal assistance;Sitting   Lower Body Dressing: Maximal assistance;Sitting/lateral leans;Sit  to/from Database administrator: Moderate assistance;BSC;+2 for safety/equipment;Minimal  assistance Toilet Transfer Details (indicate cue type and reason): pt performed SPT to [facility] with PT earlier, seated on BSC at start of OT session; mod assist from RN with VC to block knees and OT providing min guard to min assist to stand while daughter performed perineal care Toileting- Clothing Manipulation and Hygiene: With caregiver independent assisting;Maximal assistance;Sit to/from stand Toileting - Clothing Manipulation Details (indicate cue type and reason): pt's daughter Banker) performed perineal care while pt was in standing       General ADL Comments: pt much improved this date, extubated, on 2L O2 via nasal cannula, very motivated to participate.     Vision Baseline Vision/History: No visual deficits     Perception     Praxis      Cognition Arousal/Alertness: Awake/alert Behavior During Therapy: WFL for tasks assessed/performed Overall Cognitive Status: Within Functional Limits for tasks assessed                                 General Comments: oriented to person, place, situation. Hoarse whisper/mouths words, appropriate throughout session, follows all commands         Exercises     Shoulder Instructions       General Comments      Pertinent Vitals/ Pain       Pain Assessment: No/denies pain  Home Living                                          Prior Functioning/Environment              Frequency  Min 3X/week        Progress Toward Goals  OT Goals(current goals can now be found in the care plan section)  Progress towards OT goals: Progressing toward goals  Acute Rehab OT Goals Patient Stated Goal: to get stronger and more independent OT Goal Formulation: With patient/family Time For Goal Achievement: 03/24/17 Potential to Achieve Goals: Good  Plan Discharge plan remains appropriate;Frequency remains appropriate    Co-evaluation                 AM-PAC PT "6 Clicks" Daily Activity     Outcome  Measure   Help from another person eating meals?: A Little Help from another person taking care of personal grooming?: A Lot Help from another person toileting, which includes using toliet, bedpan, or urinal?: A Lot Help from another person bathing (including washing, rinsing, drying)?: A Lot Help from another person to put on and taking off regular upper body clothing?: A Little Help from another person to put on and taking off regular lower body clothing?: A Lot 6 Click Score: 14    End of Session Equipment Utilized During Treatment: Gait belt  OT Visit Diagnosis: Other abnormalities of gait and mobility (R26.89);Muscle weakness (generalized) (M62.81)   Activity Tolerance Patient tolerated treatment well   Patient Left in chair;with call bell/phone within reach;with nursing/sitter in room;with family/visitor present   Nurse Communication      Functional Assessment Tool Used: AM-PAC 6 Clicks Daily Activity;Clinical judgement Functional Limitation: Self care Self Care Current Status (Y8657): At least 60 percent but less than 80 percent impaired, limited or restricted Self Care Goal Status (Q4696): At least 40 percent but less than 60  percent impaired, limited or restricted   Time: 1027-2536 OT Time Calculation (min): 17 min  Charges: OT G-codes **NOT FOR INPATIENT CLASS** Functional Assessment Tool Used: AM-PAC 6 Clicks Daily Activity;Clinical judgement Functional Limitation: Self care Self Care Current Status (U4403): At least 60 percent but less than 80 percent impaired, limited or restricted Self Care Goal Status (K7425): At least 40 percent but less than 60 percent impaired, limited or restricted OT General Charges $OT Visit: 1 Visit OT Treatments $Self Care/Home Management : 8-22 mins  Richrd Prime, MPH, MS, OTR/L ascom [phone] 03/11/17, 4:24 PM

## 2017-03-11 NOTE — Evaluation (Signed)
Clinical/Bedside Swallow Evaluation Patient Details  Name: Miranda [name] MRN: [id number] Date of Birth: [birth year]  Today's Date: 03/11/2017 Time: SLP Start Time (ACUTE ONLY): 1230 SLP Stop Time (ACUTE ONLY): 1330 SLP Time Calculation (min) (ACUTE ONLY): 60 min  Past Medical History:  Past Medical History:  Diagnosis Date  . Dependent edema    bilateral legs  . Hot flashes   . HTN (hypertension)   . Migraines   . Nausea    Past Surgical History:  Past Surgical History:  Procedure Laterality Date  . APPENDECTOMY  1974  . CHOLECYSTECTOMY  1984  . LEFT HEART CATH AND CORONARY ANGIOGRAPHY N/A 02/26/2017   Procedure: LEFT HEART CATH AND CORONARY ANGIOGRAPHY;  Surgeon: Yvonne Kendall, MD;  Location: [facility] INVASIVE CV LAB;  Service: Cardiovascular;  Laterality: N/A;  . TONSILLECTOMY  1964   HPI:      Assessment / Plan / Recommendation Clinical Impression  Pt appeared to immediately tolerate trials of thin liquids via cup and purees w/ no immediate overt s/s of aspiration; pt exhibited no decline in O2 sats or change in RR/HR w/ po trials. However, unable to fully assess vocal quality d/t pt's hoarseness post oral extubation, and pt demonstrated a fairly nonproductive cough intermittently b/t trial consistencies given - unsure if related to irritation of the upper airway post extubation. She did not feel anything "went the wrong way" though she was orally intubated for an extended period of time. The hacking cough did not increase/decrease w/ any certain consistency. Oral phase appeared wfl w/ all trial consistencies. Pt fed self w/ min assistance d/t overall weakness. Due to concern for aspiration, recommend initiation of po's of puree for practice swallowing under supervision of NSG (applesauce, yogurt). Recommend frequent oral care. ST services will f/u w/ tomorrow for ongoing assessment of pt's swallow function and ability to initiate an oral diet. Discussed w/ MD/NSG who agreed for pt to  begin po's.  SLP Visit Diagnosis: Dysphagia, pharyngeal phase (R13.13)    Aspiration Risk  Mild aspiration risk    Diet Recommendation  initiate pleasure po's of applesauce and yogurt under supervision of Nursing; aspiration precautions. Stop if any increased s/s of aspiration noted.   Medication Administration: Via alternative means    Other  Recommendations Recommended Consults:  (Dietician f/u) Oral Care Recommendations: Oral care BID;Patient independent with oral care;Staff/trained caregiver to provide oral care   Follow up Recommendations  (TBD) ST services will f/u w/ po trials; objective swallow assessment as indicated     Frequency and Duration min 3x week  2 weeks       Prognosis Prognosis for Safe Diet Advancement: Fair (-Good) Barriers to Reach Goals: Time post onset (intubation)      Swallow Study   General Date of Onset: 02/25/17 Type of Study: Bedside Swallow Evaluation Diet Prior to this Study: NPO Temperature Spikes Noted:  (wbc 9.5;  recent increased temp earlier today) Respiratory Status: Nasal cannula (2 liters) History of Recent Intubation: Yes Length of Intubations (days): 14 days Date extubated: 03/11/17 Behavior/Cognition: Alert;Cooperative;Pleasant mood;Distractible (min) Oral Cavity Assessment: Dry (tongue w/ min coating midline) Oral Care Completed by SLP: Recent completion by staff Oral Cavity - Dentition: Adequate natural dentition Vision: Functional for self-feeding Self-Feeding Abilities: Able to feed self;Needs assist;Needs set up (overall weakness) Patient Positioning: Upright in bed Baseline Vocal Quality: Hoarse;Low vocal intensity (Dysphonia) Volitional Cough: Strong;Congested (min) Volitional Swallow: Able to elicit    Oral/Motor/Sensory Function Overall Oral Motor/Sensory Function: Within functional limits   Ice  Chips Ice chips: Impaired Presentation: Spoon (fed; 5 trials) Oral Phase Impairments:  (none) Oral Phase Functional  Implications:  (none) Pharyngeal Phase Impairments: Cough - Delayed (x2) Other Comments: unsure if related to irritation of the upper airway post extubation   Thin Liquid Thin Liquid: Impaired (5 trials) Presentation: Cup;Self Fed Oral Phase Impairments:  (none) Oral Phase Functional Implications:  (none) Pharyngeal  Phase Impairments: Cough - Delayed (x3) Other Comments: unsure if related to irritation of the upper airway post extubation    Nectar Thick Nectar Thick Liquid: Not tested   Honey Thick Honey Thick Liquid: Not tested   Puree Puree: Impaired Presentation: Self Fed;Spoon (assisted; 10 trials) Oral Phase Impairments:  (none) Oral Phase Functional Implications:  (none) Pharyngeal Phase Impairments: Cough - Delayed (x2-3) Other Comments: unsure if related to irritation of the upper airway post extubation   Solid   GO   Solid: Not tested         Jerilynn Som, MS, CCC-SLP Miranda [name] 03/11/2017,3:00 PM

## 2017-03-11 NOTE — Progress Notes (Signed)
Inpatient Rehabilitation  Note that therapies are recommending IP Rehab for post acute therapy venue. I have placed a consult order.  Spoke with spouse via phone and plan to follow up with patient tomorrow morning at bedside.  Please call with questions.  Charlane Ferretti., CCC/SLP Admission Coordinator  [facility] Inpatient Rehabilitation  Cell [phone]

## 2017-03-11 NOTE — Progress Notes (Signed)
Inpatient Rehabilitation  Received call from case manager, Marylene Land this morning to notify me that patient has been extubated this morning.  I await updated PT/OT notes.  Plan to follow up in chart later today.  Please call with questions.    Charlane Ferretti., CCC/SLP Admission Coordinator  [facility] Inpatient Rehabilitation  Cell [phone]

## 2017-03-11 NOTE — Progress Notes (Signed)
PULMONARY / CRITICAL CARE MEDICINE   Name: Miranda [name] MRN: [id number] DOB: 11-03-1960    ADMISSION DATE:  02/25/2017   PT PROFILE: 1 F suffered OOH arrest with at least 20 mins ACLS/CPR. Initial identified rhythm was VF. Underwent defib X 3 before ROSC. LHC revealed no CAD, severely reduced LV function and moderately elevated LVEDP. TTM 33 degree protocol initiated.  MAJOR EVENTS/TEST RESULTS: 08/21 CT head: no acute findings 08/21 CTA chest: no PE. Diffuse bilateral opacities 08/22 LHC: no CAD, severely reduced LV function and moderately elevated LVEDP 08/22 Echocardiogram:  08/22 Refractory shock despite norepinephrine and phenylephrine infusions. Epinephrine infusion initiated 08/23 Rewarming initiated. Sinus bradyarrhythmias. Amiodarone DC'd. Milrinone initiated. Improved gas exchange. Off of phenylephrine and norepi. Weaning of of epinephrine 08/25 Pt failed extubation requiring emergent reintubation within minutes, difficult reintubation  08/27 failed weaning trials 08/28 failed wean trials 08/29 MRI no acute process EEG slow process Appreciate neurology consult Failed weaning trials on 08/30 and 9/2  INDWELLING DEVICES:: ETT 08/21 >>  L IJ CVL 08/22 >>  R femoral A-line 08/22 >> 08/28  MICRO DATA: MRSA PCR 08/22 >>negative  Blood 08/22 >>negative  Urine 08/24>>negative Respiratory 08/24>>staph aureus   ANTIMICROBIALS:  Zosyn 08/24>> 08/28 Vancomycin 08/24>> 08/28   SUBJECTIVE:  Patient remains critically ill On full vent support Lasix as tolerated FiO2 down to 35% Will stop propofol- On precedex, patient localizes to vocal stimuli Will attempt spontaneous breathing trial and plan for extubation today  VITAL SIGNS: BP 105/67   Pulse 88   Temp 99 F (37.2 C) (Axillary)   Resp (!) 23   Ht 5\' 2"  (1.575 m)   Wt 191 lb 5.8 oz (86.8 kg)   SpO2 100%   BMI 35.00 kg/m   HEMODYNAMICS:    VENTILATOR SETTINGS: Vent Mode: PSV FiO2 (%):  [28 %] 28 % PEEP:   [5 cmH20] 5 cmH20 Pressure Support:  [10 cmH20] 10 cmH20  INTAKE / OUTPUT: I/O last 3 completed shifts: In: 4024 [I.V.:1500.6; Other:60; NG/GT:2463.3] Out: 4305 [Urine:3330; Stool:975]  PHYSICAL EXAMINATION: General: acutely ill appearing Caucasian female, intubated NAD  Neuro: sedated, withdraws from painful stimulation, PERRL Cardiovascular: irreg, bradycardia with junctional escape beats and PACs, no M/R/G Lungs: rhonchi and crackles throughout, even, non labored; no wheezes or rales  Abdomen: hypoactive BS x4, soft, obese, non distended  Ext: 2+ generalized edema, moves all extremities   LABS:  BMET  Recent Labs Lab 03/09/17 0459 03/10/17 0530 03/11/17 0529  NA 144 143 141  K 3.6 4.2 3.6  CL 110 107 99*  CO2 24 29 25   BUN 33* 27* 30*  CREATININE 0.54 0.43* 0.34*  GLUCOSE 232* 133* 299*    Electrolytes  Recent Labs Lab 03/09/17 0459 03/10/17 0530 03/11/17 0529 03/11/17 0759  CALCIUM 8.3* 8.7* 8.1*  --   MG 1.6* 1.9 1.7  --   PHOS 2.7 4.1 16.7* 4.1    CBC  Recent Labs Lab 03/08/17 0539 03/10/17 0530  WBC 7.0 9.5  HGB 9.0* 9.9*  HCT 26.4* 29.2*  PLT 226 317    Coag's No results for input(s): APTT, INR in the last 168 hours.  Sepsis Markers  Recent Labs Lab 03/05/17 0404  PROCALCITON 0.19    ABG  Recent Labs Lab 03/04/17 1750 03/06/17 1145 03/09/17 0906  PHART 7.41 7.48* 7.51*  PCO2ART 42 37 37  PO2ART 87 114* 91    Liver Enzymes  Recent Labs Lab 03/05/17 0404  AST 110*  ALT 169*  ALKPHOS 117  BILITOT 0.6  ALBUMIN 2.3*    Cardiac Enzymes No results for input(s): TROPONINI, PROBNP in the last 168 hours.  Glucose  Recent Labs Lab 03/10/17 1127 03/10/17 1600 03/10/17 1949 03/10/17 2354 03/11/17 0410 03/11/17 0731  GLUCAP 87 117* 100* 121* 123* 107*     ASSESSMENT / PLAN: 56 year old white female admitted to the ICU for acute cardiac arrest CPR for approximately 20 minutes status post hypothermia protocol  with 1 failed extubation and is now on full vent support and has failed weaning trials over the last several days-Plan for trial of spontaneous awake trial and spontaneous breathing trial and hopefully to extubate today   PULMONARY A: Acute hypoxemic respiratory failure after cardiac arrest Post reintubation subcutaneous air and possible pneumopericardium without pneumothorax 08/25-resolved P: Full vent support for now Daily SBT if/when meets crtieria   Maintain O2 sats >92% Repeat CXR today  Continue VAP bundle   CARDIOVASCULAR A:  Cardiac Arrest-initial rhythm VF Acute systolic CHF  Bradycardia-resolved Cardiology following appreciate input  Prn levophed and epinephrine drips to maintain map >65 mmHg if needed Prn Atropine for hr <40 Lasix as tolerated  RENAL A:   AKI-resolved  P:   Monitor BMET intermittently Monitor I/Os Correct electrolytes as indicated  GASTROINTESTINAL A:   Recent diarrheal illness P:   SUP: IV famotidine Continue TF protocol   HEMATOLOGIC A:   Anemia without acute blood loss  P:  DVT px: SQ heparin Monitor CBC intermittently Transfuse for hgb <7  INFECTIOUS Staph pneumonia On IV abx  ENDOCRINE A:   Stress induced hyperglycemia  P:   Continue SSI and lantus CBG's q4hrs  NEUROLOGIC A: Mechanical Intubation Discomfort  P: Maintain RASS goal 0 to -1 Precedex gtt Propofol gtt only if hypoxic, avoid narcotics WUA ongoing Promote family presence at bedside     Critical Care Time devoted to patient care services described in this note is 36 minutes.   Overall, patient is critically ill, prognosis is guarded.  Patient with Multiorgan failure and at high risk for cardiac arrest and death.    Lucie Leather, M.D.  [facility]  Medical Director [facility] Medical Director [facility] Cardio-Pulmonary Department

## 2017-03-11 NOTE — Progress Notes (Signed)
CH made a follow up visit. There was a good spirit in the room. The Pt was extubated earlier today and she responded positively. Pt was awake, alert, and in a very good spirit. Family was overjoyed! CH Fred offered prayer of thanksgiving. CH is available for follow up as needed.    03/11/17 1000  Clinical Encounter Type  Visited With Patient and family together  Visit Type Follow-up;Spiritual support  Consult/Referral To Chaplain  Spiritual Encounters  Spiritual Needs Prayer;Emotional

## 2017-03-11 NOTE — Progress Notes (Signed)
Pt up to [facility] and then to her chair, 2 liters [place], expresses desire for PO intake, pt given yogurt from kitchen per SLP, will try to feed herself per OT, has urinated post foley cath DC

## 2017-03-11 NOTE — Progress Notes (Signed)
Extubated without complications to 2lnc 

## 2017-03-11 NOTE — Progress Notes (Addendum)
Per Dr Belia Heman, remove foley and flexi seal, stop D5 and precedex.  Pt remains w/ VSS on 2 liters [place], voice is more audible and she is conversing with fer family Also order GI consult for chronic diarrhea

## 2017-03-11 NOTE — Progress Notes (Signed)
Subjective: Patient improved.  Extubated. Following commands.    Objective: Current vital signs: BP (!) 113/54 (BP Location: Left Arm)   Pulse 68   Temp 98 F (36.7 C) (Oral)   Resp (!) 24   Ht _0  (1.575 m)   Wt 86.8 kg (191 lb 5.8 oz)   SpO2 99%   BMI 35.00 kg/m  Vital signs in last 24 hours: Temp:  [98 F (36.7 C)-100.5 F (38.1 C)] 98 F (36.7 C) (09/04 1200) Pulse Rate:  [52-91] 68 (09/04 1300) Resp:  [17-39] 24 (09/04 1300) BP: (96-126)/(41-72) 113/54 (09/04 1300) SpO2:  [95 %-100 %] 99 % (09/04 1300) FiO2 (%):  [28 %] 28 % (09/04 0800) Weight:  [86.8 kg (191 lb 5.8 oz)] 86.8 kg (191 lb 5.8 oz) (09/04 0500)  Intake/Output from previous day: 09/03 0701 - 09/04 0700 In: 2407.6 [I.V.:904.3; NG/GT:1503.3] Out: 2760 [Urine:2010; Stool:750] Intake/Output this shift: Total I/O In: 259.2 [I.V.:149.2; NG/GT:110] Out: 650 [Urine:600; Stool:50] Nutritional status:    Neurologic Exam: Mental Status: Alert. Speech fluent without evidence of aphasia.  Able to follow 3 step commands without difficulty. Cranial Nerves: II: Discs flat bilaterally; Visual fields grossly normal, pupils equal, round, reactive to light and accommodation III,IV, VI: ptosis not present, extra-ocular motions intact bilaterally V,VII: smile symmetric, facial light touch sensation normal bilaterally VIII: hearing normal bilaterally IX,X: gag reflex present XI: bilateral shoulder shrug XII: midline tongue extension Motor: Able to lift all extremities against gravity with no focal weakness noted.   Sensory: Pinprick and light touch intact throughout, bilaterally   Lab Results: Basic Metabolic Panel:  Recent Labs Lab 03/07/17 0351 03/08/17 0539 03/08/17 1800 03/08/17 1841 03/09/17 0459 03/10/17 0530 03/11/17 0529 03/11/17 0759  NA 149* 152*  --  149* 144 143 141  --   K 3.5 5.0  --  3.9 3.6 4.2 3.6  --   CL 113* 119*  --  115* 110 107 99*  --   CO2 30 27  --  _1 --   GLUCOSE  111* 237*  --  196* 232* 133* 299*  --   BUN 29* 43*  --  36* 33* 27* 30*  --   CREATININE 0.53 0.67  --  0.56 0.54 0.43* 0.34*  --   CALCIUM 7.6* 8.4*  --  8.7* 8.3* 8.7* 8.1*  --   MG 2.2 2.3 2.0  --  1.6* 1.9 1.7  --   PHOS 2.7  --   --   --  2.7 4.1 16.7* 4.1    Liver Function Tests:  Recent Labs Lab 03/05/17 0404  AST 110*  ALT 169*  ALKPHOS 117  BILITOT 0.6  PROT 6.6  ALBUMIN 2.3*   No results for input(s): LIPASE, AMYLASE in the last 168 hours.  Recent Labs Lab 03/05/17 0404  AMMONIA 44*    CBC:  Recent Labs Lab 03/08/17 0539 03/10/17 0530  WBC 7.0 9.5  HGB 9.0* 9.9*  HCT 26.4* 29.2*  MCV 90.7 91.6  PLT 226 317    Cardiac Enzymes: No results for input(s): CKTOTAL, CKMB, CKMBINDEX, TROPONINI in the last 168 hours.  Lipid Panel:  Recent Labs Lab 03/06/17 0435 03/07/17 1933 03/10/17 1942  TRIG 329* 262* 119    CBG:  Recent Labs Lab 03/11/17 0410 03/11/17 0731 03/11/17 1125 03/11/17 1127 03/11/17 1215  GLUCAP 123* 107* 61* 64* 104*    Microbiology: Results for orders placed or performed during the hospital encounter of 02/25/17  MRSA PCR Screening     Status: None   Collection Time: 02/26/17  1:36 AM  Result Value Ref Range Status   MRSA by PCR NEGATIVE NEGATIVE Final    Comment:        The GeneXpert MRSA Assay (FDA approved for NASAL specimens only), is one component of a comprehensive MRSA colonization surveillance program. It is not intended to diagnose MRSA infection nor to guide or monitor treatment for MRSA infections.   Blood culture (routine x 2)     Status: None   Collection Time: 02/26/17  2:08 AM  Result Value Ref Range Status   Specimen Description BLOOD LEFT WRIST  Final   Special Requests   Final    BOTTLES DRAWN AEROBIC AND ANAEROBIC Blood Culture adequate volume   Culture NO GROWTH 5 DAYS  Final   Report Status 03/03/2017 FINAL  Final  Blood culture (routine x 2)     Status: None   Collection Time: 02/26/17   3:40 AM  Result Value Ref Range Status   Specimen Description BLOOD PORT  Final   Special Requests   Final    BOTTLES DRAWN AEROBIC AND ANAEROBIC Blood Culture adequate volume   Culture NO GROWTH 5 DAYS  Final   Report Status 03/03/2017 FINAL  Final  Urine Culture     Status: None   Collection Time: 02/28/17 10:18 AM  Result Value Ref Range Status   Specimen Description URINE, CATHETERIZED  Final   Special Requests Normal  Final   Culture   Final    NO GROWTH Performed at [facility], [place] [address], [place], [place] [address]    Report Status 03/01/2017 FINAL  Final  Culture, respiratory (NON-Expectorated)     Status: None   Collection Time: 02/28/17 10:45 AM  Result Value Ref Range Status   Specimen Description TRACHEAL ASPIRATE  Final   Special Requests NONE  Final   Gram Stain   Final    ABUNDANT WBC PRESENT,BOTH PMN AND MONONUCLEAR FEW SQUAMOUS EPITHELIAL CELLS PRESENT MODERATE GRAM POSITIVE COCCI FEW GRAM NEGATIVE RODS RARE GRAM POSITIVE RODS RARE GRAM NEGATIVE COCCI Performed at [facility], [place] [address] [place], [place] [address]    Culture ABUNDANT STAPHYLOCOCCUS AUREUS  Final   Report Status 03/02/2017 FINAL  Final   Organism ID, Bacteria STAPHYLOCOCCUS AUREUS  Final      Susceptibility   Staphylococcus aureus - MIC*    CIPROFLOXACIN <=0.5 SENSITIVE Sensitive     ERYTHROMYCIN <=0.25 SENSITIVE Sensitive     GENTAMICIN <=0.5 SENSITIVE Sensitive     OXACILLIN <=0.25 SENSITIVE Sensitive     TETRACYCLINE <=1 SENSITIVE Sensitive     VANCOMYCIN <=0.5 SENSITIVE Sensitive     TRIMETH/SULFA <=10 SENSITIVE Sensitive     CLINDAMYCIN <=0.25 SENSITIVE Sensitive     RIFAMPIN <=0.5 SENSITIVE Sensitive     Inducible Clindamycin NEGATIVE Sensitive     * ABUNDANT STAPHYLOCOCCUS AUREUS  C difficile quick scan w PCR reflex     Status: None   Collection Time: 03/04/17 11:40 AM  Result Value Ref Range Status   C Diff antigen NEGATIVE NEGATIVE Final   C Diff  toxin NEGATIVE NEGATIVE Final   C Diff interpretation No C. difficile detected.  Final  Culture, bal-quantitative     Status: None   Collection Time: 03/07/17 12:37 PM  Result Value Ref Range Status   Specimen Description TRACHEAL ASPIRATE  Final   Special Requests Normal  Final   Gram  Stain   Final    ABUNDANT WBC PRESENT, PREDOMINANTLY PMN NO ORGANISMS SEEN    Culture   Final    NO GROWTH 2 DAYS Performed at [facility] [address] [place], [place] [address]    Report Status 03/09/2017 FINAL  Final    Coagulation Studies: No results for input(s): LABPROT, INR in the last 72 hours.  Imaging: Ct Chest Wo Contrast  Result Date: 03/10/2017 CLINICAL DATA:  Acute respiratory illness EXAM: CT CHEST WITHOUT CONTRAST TECHNIQUE: Multidetector CT imaging of the chest was performed following the standard protocol without IV contrast. COMPARISON:  Radiograph 03/10/2017, CT chest 02/25/2017 FINDINGS: Cardiovascular: Limited evaluation without intravenous contrast. Right upper extremity catheter tip positioned at the cavoatrial junction. Mild atherosclerotic calcification. Minimal coronary artery calcification. Borderline to mild cardiomegaly. Negative for large pericardial effusion. Mediastinum/Nodes: Stable small mediastinal lymph nodes. Largest is seen in the right paratracheal space and measures 9 mm. Endotracheal tube tip terminates just above the carina. Esophageal tube tip visualized to the mid stomach. Lungs/Pleura: Negative for pneumothorax or significant pleural effusion. Scattered bilateral ground-glass densities including a more focused area of consolidation in the peripheral right upper lobe, corresponding to the radiographic abnormality overall the lung aeration has improved since prior CT from 02/25/2017. Upper Abdomen: Surgical clips in the gallbladder fossa. No acute abnormality. Musculoskeletal: Motion artifact causes false appearance of lower and upper sternal fracture. Acute  right fifth anterior rib fracture. IMPRESSION: 1. Mild to moderate scattered bilateral ground-glass densities, favored to represent multifocal pneumonia, overall the aeration of the lung fields is improved compared to the prior chest CT. 2. Endotracheal tube tip is just above the carina 3. Acute to subacute right fifth anterior rib fracture Aortic Atherosclerosis (ICD10-I70.0). Electronically Signed   By: Donavan Foil M.D.   On: 03/10/2017 14:21   Dg Chest Port 1 View  Result Date: 03/10/2017 CLINICAL DATA:  Acute respiratory failure, ventilatory support EXAM: PORTABLE CHEST 1 VIEW COMPARISON:  03/09/2017 FINDINGS: Endotracheal tube is 3.4 cm above the carina. NG tube within the stomach, tip not visualized. Right PICC line tip mid SVC level. Stable cardiomegaly with vascular congestion and diffuse mixed airspace and interstitial opacities versus edema. No enlarging effusion or pneumothorax. Right peripheral mid lung nodular opacity remains indeterminate for superimposed shadows or lung nodule. Recommend attention on follow-up exams. IMPRESSION: Stable cardiomegaly with similar diffuse mixed interstitial and airspace opacities, suspicious for infection versus edema. No enlarging effusion or pneumothorax Possible right midlung peripheral lung nodule. Electronically Signed   By: Jerilynn Mages.  Shick M.D.   On: 03/10/2017 09:29    Medications:  I have reviewed the patient's current medications. Scheduled: . chlorhexidine  15 mL Mouth Rinse BID  . enoxaparin (LOVENOX) injection  40 mg Subcutaneous Q24H  . insulin aspart  0-20 Units Subcutaneous Q4H  . ipratropium-albuterol  3 mL Nebulization Q6H  . mouth rinse  15 mL Mouth Rinse q12n4p  . QUEtiapine  50 mg Per Tube QHS  . sodium chloride flush  10-40 mL Intracatheter Q12H    Assessment/Plan: Patient improved.  Extubated.  Following commands.   No further neurologic intervention is recommended at this time.  If further questions arise, please call or page at  that time.  Thank you for allowing neurology to participate in the care of this patient.    LOS: 13 days   Alexis Goodell, MD Neurology [phone] 03/11/2017  2:09 PM

## 2017-03-11 NOTE — Progress Notes (Signed)
  Dana NP notified of Phosphorus level of 16.7

## 2017-03-11 NOTE — Progress Notes (Signed)
[facility] - [place] at [facility]   PATIENT NAME: Miranda [name]    MR#:  [id number]  DATE OF BIRTH:  19-Jan-1961  SUBJECTIVE:   Intubated,sedated and on the vent .  tapered sedation,  More calm and follows simple commands   Finally extubated today, talking in low voice, but very happy.  REVIEW OF SYSTEMS:   Review of Systems  Constitutional: Negative for chills, fever and weight loss.  HENT: Negative for congestion, ear pain, hearing loss and sore throat.   Eyes: Negative for blurred vision, double vision and pain.  Respiratory: Negative for cough, sputum production and shortness of breath.   Cardiovascular: Negative for chest pain, orthopnea and leg swelling.  Gastrointestinal: Negative for constipation, nausea and vomiting.  Genitourinary: Negative for flank pain, frequency and urgency.  Musculoskeletal: Negative for joint pain and neck pain.  Skin: Negative for rash.  Neurological: Negative for dizziness, tingling, tremors, sensory change and focal weakness.  Psychiatric/Behavioral: Negative for depression, memory loss and substance abuse. The patient does not have insomnia.    DRUG ALLERGIES:   Allergies  Allergen Reactions  . Ace Inhibitors   . Compazine [Prochlorperazine Edisylate]   . Lisinopril   . Penicillins     Has patient had a PCN reaction causing immediate rash, facial/tongue/throat swelling, SOB or lightheadedness with hypotension: Unknown Has patient had a PCN reaction causing severe rash involving mucus membranes or skin necrosis: Unknown Has patient had a PCN reaction that required hospitalization: Unknown Has patient had a PCN reaction occurring within the last 10 years: Unknown If all of the above answers are "NO", then may proceed with Cephalosporin use.     VITALS:  Blood pressure 116/89, pulse 77, temperature 98.9 F (37.2 C), temperature source Oral, resp. rate (!) 29, height 5\' 2"  (1.575 m), weight 86.8 kg (191 lb 5.8 oz),  SpO2 100 %.  PHYSICAL EXAMINATION:   Physical Exam  GENERAL:  56 y.o.-year-old patient lying in the bed with no acute distress.  EYES: Pupils equal, round, reactive to light . No scleral icterus. Extraocular muscles intact.  HEENT: Head atraumatic, normocephalic. Oropharynx and nasopharynx clear. Intubated NECK:  Supple, no jugular venous distention. No thyroid enlargement, no tenderness, NG tube.  LUNGS: Normal breath sounds bilaterally, no wheezing, rales, rhonchi. No use of accessory muscles of respiration.  CARDIOVASCULAR: S1, S2 normal. No murmurs, rubs, or gallops.  ABDOMEN: Soft, nontender, nondistended. Bowel sounds feeble. No organomegaly or mass.  EXTREMITIES: No cyanosis, clubbing or edema b/l.    NEUROLOGIC: Intubated on the ventilator , but looks and respond by noding her head. Moves limbs to simple commands. Was trying to write with a pen on a paper, but could not write any letter. PSYCHIATRIC:   intubated on the ventilator , without sedation, calm and follows simple commands. SKIN: No obvious rash, lesion, or ulcer.   LABORATORY PANEL:  CBC  Recent Labs Lab 03/10/17 0530  WBC 9.5  HGB 9.9*  HCT 29.2*  PLT 317    Chemistries   Recent Labs Lab 03/05/17 0404  03/11/17 0529  NA 145  < > 141  K 3.9  < > 3.6  CL 111  < > 99*  CO2 27  < > 25  GLUCOSE 117*  < > 299*  BUN 41*  < > 30*  CREATININE 0.54  < > 0.34*  CALCIUM 8.1*  < > 8.1*  MG  --   < > 1.7  AST 110*  --   --  ALT 169*  --   --   ALKPHOS 117  --   --   BILITOT 0.6  --   --   < > = values in this interval not displayed. Cardiac Enzymes No results for input(s): TROPONINI in the last 168 hours. RADIOLOGY:  Ct Chest Wo Contrast  Result Date: 03/10/2017 CLINICAL DATA:  Acute respiratory illness EXAM: CT CHEST WITHOUT CONTRAST TECHNIQUE: Multidetector CT imaging of the chest was performed following the standard protocol without IV contrast. COMPARISON:  Radiograph 03/10/2017, CT chest 02/25/2017  FINDINGS: Cardiovascular: Limited evaluation without intravenous contrast. Right upper extremity catheter tip positioned at the cavoatrial junction. Mild atherosclerotic calcification. Minimal coronary artery calcification. Borderline to mild cardiomegaly. Negative for large pericardial effusion. Mediastinum/Nodes: Stable small mediastinal lymph nodes. Largest is seen in the right paratracheal space and measures 9 mm. Endotracheal tube tip terminates just above the carina. Esophageal tube tip visualized to the mid stomach. Lungs/Pleura: Negative for pneumothorax or significant pleural effusion. Scattered bilateral ground-glass densities including a more focused area of consolidation in the peripheral right upper lobe, corresponding to the radiographic abnormality overall the lung aeration has improved since prior CT from 02/25/2017. Upper Abdomen: Surgical clips in the gallbladder fossa. No acute abnormality. Musculoskeletal: Motion artifact causes false appearance of lower and upper sternal fracture. Acute right fifth anterior rib fracture. IMPRESSION: 1. Mild to moderate scattered bilateral ground-glass densities, favored to represent multifocal pneumonia, overall the aeration of the lung fields is improved compared to the prior chest CT. 2. Endotracheal tube tip is just above the carina 3. Acute to subacute right fifth anterior rib fracture Aortic Atherosclerosis (ICD10-I70.0). Electronically Signed   By: Jasmine Pang M.D.   On: 03/10/2017 14:21   Dg Chest Port 1 View  Result Date: 03/10/2017 CLINICAL DATA:  Acute respiratory failure, ventilatory support EXAM: PORTABLE CHEST 1 VIEW COMPARISON:  03/09/2017 FINDINGS: Endotracheal tube is 3.4 cm above the carina. NG tube within the stomach, tip not visualized. Right PICC line tip mid SVC level. Stable cardiomegaly with vascular congestion and diffuse mixed airspace and interstitial opacities versus edema. No enlarging effusion or pneumothorax. Right peripheral  mid lung nodular opacity remains indeterminate for superimposed shadows or lung nodule. Recommend attention on follow-up exams. IMPRESSION: Stable cardiomegaly with similar diffuse mixed interstitial and airspace opacities, suspicious for infection versus edema. No enlarging effusion or pneumothorax Possible right midlung peripheral lung nodule. Electronically Signed   By: Judie Petit.  Shick M.D.   On: 03/10/2017 09:29   ASSESSMENT AND PLAN:   Ms. Eldreth reportedly had diarrhea over the last 2-3 days for which she was taking up to 12 Imodium per day. She was trying to stay well hydrated and had also stopped taking her blood pressure medications on account of her GI illness. Around 9 PM, Ms. Mcmonigle's husband found the patient seated on the toilet. She attempted to stand up but passed out.  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 Severely reduced LVEF less than <20%  -repeat echo showed EF of 60-65% -Patient was on IV levophed,Milrinone- improved now. - Was IV propofol -Patient was  on hypothermia protocol -CT chest negative for PE -CXR shows pulmonary edema. Received Lasix - Xray shows still multilobar pneumonia, CT chest done. - Extubated today, no distress.  *Acute renal failure secondary to #1 -making good urine -Creatinine stable  *Febrile illness- multilobar pneumonia -Low-grade fever, tachycardia elevated white count of 40,000--- 19K -Blood  cultures so far negative -Empiric IV vancomycin and Zosyn- changed to rocephin -C. Difficile negative -chest x-ray shows diffuse pulmonary infiltrate - Had bronch done 03/07/17- 10 ml cloudy thick secretion suctioned from right Middle lobe. Sent for culture.- negative so far.  - Sputum cx on 8/24 have Pansensitive staph.   Abx given until 03/07/17  *altered mental status -Patient currently has periods of agitation  -Seen by Dr.  Thad Ranger -- neurology -Recommend EEG and brain MRI. - No acute injuries. Taper sedation slowly. - likely due to sedative meds. - more calm and following commands today.  *Metabolic acidosis secondary to #1  *Hyperglycemia -Patient currently off insulin drip  * Nutrition - on Tube feeding, will have to check swallow eval now.  *DVT prophylaxis subcutaneous heparin  Now extubated, may need rehab.  Case discussed with Care Management/Social Worker. Management plans discussed with the  family and they are in agreement.  CODE STATUS: Full  DVT Prophylaxis: Subcutaneous heparin  TOTAL CRITICAL TIME TAKING CARE OF THIS PATIENT: 30 minutes.  >50% time spent on counselling and coordination of care Spoke to her daughter in room today.  Note: This dictation was prepared with Dragon dictation along with smaller phrase technology. Any transcriptional errors that result from this process are unintentional.  Altamese Dilling M.D on 03/11/2017 at 5:18 PM  Between 7am to 6pm - Pager - [phone]  After 6pm go to [email or web address] - Social research officer, government  [facility]  Office  [phone]  CC: Primary care physician; Merwyn Katos, MD

## 2017-03-11 NOTE — Care Management (Signed)
RNCM spoke with Miranda [name] with CIR regarding patient extubation. Efraim Kaufmann will follow rehab notes today.

## 2017-03-11 NOTE — Progress Notes (Signed)
Pt extubated to 2 liters Hilda at 0820, VSS, per Dr Belia Heman transfer to stepdown.  Pt is alert and mostly oriented, talking in a whisper, family at bedside.  Pt denies pain or any SOB

## 2017-03-11 NOTE — Progress Notes (Signed)
OT Cancellation Note  Patient Details Name: Miranda [name] MRN: [id number] DOB: 05/19/1961   Cancelled Treatment:    Reason Eval/Treat Not Completed: Patient at procedure or test/ unavailable. Pt working with PT. Will re-attempt OT treatment next date.  Richrd Prime, MPH, MS, OTR/L ascom [phone] 03/11/17, 3:17 PM

## 2017-03-11 NOTE — Progress Notes (Signed)
MEDICATION RELATED CONSULT NOTE  Pharmacy Consult for Electrolyte Monitoring Indication: Hypomagnesemia  Allergies  Allergen Reactions  . Ace Inhibitors   . Compazine [Prochlorperazine Edisylate]   . Lisinopril   . Penicillins     Has patient had a PCN reaction causing immediate rash, facial/tongue/throat swelling, SOB or lightheadedness with hypotension: Unknown Has patient had a PCN reaction causing severe rash involving mucus membranes or skin necrosis: Unknown Has patient had a PCN reaction that required hospitalization: Unknown Has patient had a PCN reaction occurring within the last 10 years: Unknown If all of the above answers are "NO", then may proceed with Cephalosporin use.     Patient Measurements: Height: 5\' 2"  (157.5 cm) Weight: 191 lb 5.8 oz (86.8 kg) IBW/kg (Calculated) : 50.1  Vital Signs: Temp: 99.3 F (37.4 C) (09/04 2000) Temp Source: Oral (09/04 2000) BP: 129/108 (09/04 2200) Pulse Rate: 95 (09/04 2200) Intake/Output from previous day: 09/03 0701 - 09/04 0700 In: 2407.6 [I.V.:904.3; NG/GT:1503.3] Out: 2760 [Urine:2010; Stool:750] Intake/Output from this shift: Total I/O In: 100 [P.O.:100] Out: -   Labs:  Recent Labs  03/09/17 0459 03/10/17 0530 03/11/17 0529 03/11/17 0759  WBC  --  9.5  --   --   HGB  --  9.9*  --   --   HCT  --  29.2*  --   --   PLT  --  317  --   --   CREATININE 0.54 0.43* 0.34*  --   MG 1.6* 1.9 1.7  --   PHOS 2.7 4.1 16.7* 4.1   Estimated Creatinine Clearance: 78.4 mL/min (A) (by C-G formula based on SCr of 0.34 mg/dL (L)).  Sodium (mmol/L)  Date Value  03/11/2017 141  08/16/2016 141   Potassium (mmol/L)  Date Value  03/11/2017 3.6   Calcium (mg/dL)  Date Value  79/72/8206 8.1 (L)   Albumin (g/dL)  Date Value  01/56/1537 2.3 (L)    Assessment: 56 y/o F s/p cardiac arrest and hypothermia protocol with hypomagnesemia. Due to high risk of recurrent arrhythmia, cardiology wants a goal K of > or = 4 and Mg  > or = 2.   Plan:  Will recheck electorlytes with am labs.   Pharmacy will continue to monitor and adjust per consult.   Simpson,Michael L 03/11/2017,10:22 PM

## 2017-03-12 DIAGNOSIS — R197 Diarrhea, unspecified: Secondary | ICD-10-CM

## 2017-03-12 DIAGNOSIS — I5021 Acute systolic (congestive) heart failure: Secondary | ICD-10-CM | POA: Diagnosis not present

## 2017-03-12 DIAGNOSIS — I469 Cardiac arrest, cause unspecified: Secondary | ICD-10-CM | POA: Diagnosis not present

## 2017-03-12 LAB — BASIC METABOLIC PANEL
Anion gap: 8 (ref 5–15)
BUN: 25 mg/dL — AB (ref 6–20)
CHLORIDE: 108 mmol/L (ref 101–111)
CO2: 27 mmol/L (ref 22–32)
Calcium: 8.4 mg/dL — ABNORMAL LOW (ref 8.9–10.3)
Creatinine, Ser: 0.52 mg/dL (ref 0.44–1.00)
GFR calc Af Amer: >60 mL/min (ref >60)
Glucose, Bld: 92 mg/dL (ref 65–99)
POTASSIUM: 3 mmol/L — AB (ref 3.5–5.1)
SODIUM: 143 mmol/L (ref 135–145)

## 2017-03-12 LAB — GLUCOSE, CAPILLARY
GLUCOSE-CAPILLARY: 66 mg/dL (ref 65–99)
GLUCOSE-CAPILLARY: 69 mg/dL (ref 65–99)
GLUCOSE-CAPILLARY: 82 mg/dL (ref 65–99)
GLUCOSE-CAPILLARY: 84 mg/dL (ref 65–99)
GLUCOSE-CAPILLARY: 88 mg/dL (ref 65–99)
Glucose-Capillary: 106 mg/dL — ABNORMAL HIGH (ref 65–99)
Glucose-Capillary: 84 mg/dL (ref 65–99)

## 2017-03-12 LAB — MAGNESIUM: MAGNESIUM: 1.8 mg/dL (ref 1.7–2.4)

## 2017-03-12 LAB — PHOSPHORUS: PHOSPHORUS: 3.1 mg/dL (ref 2.5–4.6)

## 2017-03-12 LAB — POTASSIUM: POTASSIUM: 3.7 mmol/L (ref 3.5–5.1)

## 2017-03-12 MED ORDER — POTASSIUM CHLORIDE 10 MEQ/100ML IV SOLN: 10.0000 meq | INTRAVENOUS | Status: DC

## 2017-03-12 MED ORDER — MAGNESIUM SULFATE 2 GM/50ML IV SOLN
2.0000 g | Freq: Once | INTRAVENOUS | Status: AC
  Administered 2017-03-12: 2 g via INTRAVENOUS
  Filled 2017-03-12: qty 50

## 2017-03-12 MED ORDER — POTASSIUM CHLORIDE 20 MEQ PO PACK
40.0000 meq | PACK | Freq: Once | ORAL | Status: AC
  Administered 2017-03-12: 40 meq via ORAL
  Filled 2017-03-12: qty 2

## 2017-03-12 MED ORDER — POTASSIUM CHLORIDE 10 MEQ/50ML IV SOLN
10.0000 meq | INTRAVENOUS | Status: AC
  Administered 2017-03-12 (×3): 10 meq via INTRAVENOUS
  Filled 2017-03-12 (×5): qty 50

## 2017-03-12 MED ORDER — POTASSIUM CHLORIDE CRYS ER 20 MEQ PO TBCR
40.0000 meq | EXTENDED_RELEASE_TABLET | Freq: Two times a day (BID) | ORAL | Status: AC
Start: 2017-03-12 — End: 2017-03-13
  Administered 2017-03-12 – 2017-03-13 (×4): 40 meq via ORAL
  Filled 2017-03-12 (×4): qty 2

## 2017-03-12 NOTE — Progress Notes (Signed)
Patient Name: Miranda [name] Date of Encounter: 03/12/2017  Primary Cardiologist: New to [facility] - consult by End  Hospital Problem List     Principal Problem:   Cardiac arrest with ventricular fibrillation [facility]) Active Problems:   Cardiac arrest (HCC)   Acute respiratory failure (HCC)   Hypokalemia   Encounter for central line placement   Cardiogenic shock (HCC)   Acute pulmonary edema (HCC)   Anoxic encephalopathy (HCC)   Bradycardia     Subjective    answering questions appropriately  horse voice.  Family not at the bedside  most medications held, all sedation medications   telemetry reviewed showing rare PVC  nurses report frequent PVCs proximal my 4-5 days ago while patient was intubated  Inpatient Medications    Scheduled Meds: . enoxaparin (LOVENOX) injection  40 mg Subcutaneous Q24H  . insulin aspart  0-20 Units Subcutaneous Q4H  . ipratropium-albuterol  3 mL Nebulization Q6H  . potassium chloride  40 mEq Oral BID  . QUEtiapine  50 mg Per Tube QHS  . sodium chloride flush  10-40 mL Intracatheter Q12H   Continuous Infusions: . potassium chloride Stopped (03/12/17 0948)   PRN Meds: acetaminophen, atropine, sennosides **AND** docusate, levalbuterol, naLOXone (NARCAN)  injection, pneumococcal 23 valent vaccine, sodium chloride flush   Vital Signs    Vitals:   03/12/17 0700 03/12/17 0754 03/12/17 0800 03/12/17 0900  BP: (!) 142/67     Pulse: 70  98   Resp: (!) 31  12 19   Temp:   98.4 F (36.9 C)   TempSrc:   Oral   SpO2: 91% 93% 92%   Weight:      Height:        Intake/Output Summary (Last 24 hours) at 03/12/17 1021 Last data filed at 03/12/17 0850  Gross per 24 hour  Intake              400 ml  Output              530 ml  Net             -130 ml   Filed Weights   03/10/17 0400 03/11/17 0500 03/12/17 0403  Weight: 190 lb 14.7 oz (86.6 kg) 191 lb 5.8 oz (86.8 kg) 183 lb 6.8 oz (83.2 kg)    Physical Exam    GEN:  no acute distress.   Extubated, communicative HEENT: Grossly normal.  Neck: Supple, no JVD, carotid bruits, or masses. Cardiac:  Regular rate and rhythm, no murmurs, rubs, or gallops. No clubbing, cyanosis, edema.  Radials/DP/PT 2+ and equal bilaterally.  Respiratory:   Expiratory wheezing otherwise clear bilaterally GI: Soft, nontender, nondistended, BS + x 4. MS: no deformity or atrophy. Skin: warm and dry, no rash. Neuro:   Full neuro exam not performed, appears grossly normal Psych:  Normal affect, communicative, appropriate  Labs    CBC  Recent Labs  03/10/17 0530  WBC 9.5  HGB 9.9*  HCT 29.2*  MCV 91.6  PLT 317   Basic Metabolic Panel  Recent Labs  03/11/17 0529 03/11/17 0759 03/12/17 0356  NA 141  --  143  K 3.6  --  3.0*  CL 99*  --  108  CO2 25  --  27  GLUCOSE 299*  --  92  BUN 30*  --  25*  CREATININE 0.34*  --  0.52  CALCIUM 8.1*  --  8.4*  MG 1.7  --  1.8  PHOS 16.7* 4.1  3.1   Liver Function Tests No results for input(s): AST, ALT, ALKPHOS, BILITOT, PROT, ALBUMIN in the last 72 hours. No results for input(s): LIPASE, AMYLASE in the last 72 hours. Cardiac Enzymes No results for input(s): CKTOTAL, CKMB, CKMBINDEX, TROPONINI in the last 72 hours. BNP Invalid input(s): POCBNP D-Dimer No results for input(s): DDIMER in the last 72 hours. Hemoglobin A1C No results for input(s): HGBA1C in the last 72 hours. Fasting Lipid Panel  Recent Labs  03/10/17 1942  TRIG 119   Thyroid Function Tests No results for input(s): TSH, T4TOTAL, T3FREE, THYROIDAB in the last 72 hours.  Invalid input(s): FREET3  Telemetry     normal sinus rhythm, rare PVC Personally Reviewed  ECG    n/a - Personally Reviewed  Radiology    Ct Chest Wo Contrast  Result Date: 03/10/2017 CLINICAL DATA:  Acute respiratory illness EXAM: CT CHEST WITHOUT CONTRAST TECHNIQUE: Multidetector CT imaging of the chest was performed following the standard protocol without IV contrast. COMPARISON:   Radiograph 03/10/2017, CT chest 02/25/2017 FINDINGS: Cardiovascular: Limited evaluation without intravenous contrast. Right upper extremity catheter tip positioned at the cavoatrial junction. Mild atherosclerotic calcification. Minimal coronary artery calcification. Borderline to mild cardiomegaly. Negative for large pericardial effusion. Mediastinum/Nodes: Stable small mediastinal lymph nodes. Largest is seen in the right paratracheal space and measures 9 mm. Endotracheal tube tip terminates just above the carina. Esophageal tube tip visualized to the mid stomach. Lungs/Pleura: Negative for pneumothorax or significant pleural effusion. Scattered bilateral ground-glass densities including a more focused area of consolidation in the peripheral right upper lobe, corresponding to the radiographic abnormality overall the lung aeration has improved since prior CT from 02/25/2017. Upper Abdomen: Surgical clips in the gallbladder fossa. No acute abnormality. Musculoskeletal: Motion artifact causes false appearance of lower and upper sternal fracture. Acute right fifth anterior rib fracture. IMPRESSION: 1. Mild to moderate scattered bilateral ground-glass densities, favored to represent multifocal pneumonia, overall the aeration of the lung fields is improved compared to the prior chest CT. 2. Endotracheal tube tip is just above the carina 3. Acute to subacute right fifth anterior rib fracture Aortic Atherosclerosis (ICD10-I70.0). Electronically Signed   By: Jasmine Pang M.D.   On: 03/10/2017 14:21    Cardiac Studies   TTE 03/03/2017: Study Conclusions  - Left ventricle: The cavity size was normal. There was mild concentric hypertrophy. Systolic function was normal. The estimated ejection fraction was in the range of 55% to 60%. Wall motion was normal; there were no regional wall motion abnormalities. - Aortic valve: There was mild regurgitation. - Pulmonary arteries: Systolic pressure was mildly to  moderately increased. PA peak pressure: 46 mm Hg (S).  Impressions:  - Limited echo to evaluate ejection fraction. When compared to recent echo, LV systolic function normalized completely with improvement in ejection fraction of from 10% to 55%.  LHC 02/26/2017: Conclusion   Conclusions: 1. No angiographically significant coronary artery disease. 2. Severely reduced left ventricular contraction (<25%) with moderately elevated left ventricular filling pressure (LVEDP 28 mmHg).  Recommendations: 1. Hypothermia protocol per hospitalist and ICU teams. 2. Check magnesium level; replete electrolytes for goal potassium and magnesium greater than 4.0 and 2.0, respectively. 3. Consider gentle diuresis, given elevated LVEDP and severely reduced LVEF. 4. Obtain transthoracic echocardiogram    TTE 02/26/2017: Study Conclusions  - Left ventricle: The cavity size was mildly dilated. Systolic function was severely reduced. The estimated ejection fraction was <20%. Diffuse hypokinesis. Regional wall motion abnormalities cannot be excluded. The study is not  technically sufficient to allow evaluation of LV diastolic function. - Mitral valve: There was mild regurgitation. - Right ventricle: Systolic function was mildly reduced. - Pulmonary arteries: Systolic pressure could not be accurately estimated. - Inferior vena cava: The vessel was normal in size. The respirophasic diameter changes were in the normal range (>= 50%), consistent with normal central venous pressure.   Patient Profile     56 y.o. female with history of HTN, migraine disorder, and dependent edema who has had diarrhea for the past 2-3 days, taking up to 12 Imodium daily presented to [facility] with VF arrest. Cardiac cath showed no significant CAD.  Assessment & Plan    1. VF arrest:  diarrhea  On arrival,significant electrolyte abnormalities,  Taking Imodium  Catheterization showed no significant  coronary artery disease.  Severely reduced LVEF  On arrival, dramatic improvement up to normal function one week later  initial cardiomyopathy  likely secondary to cardiac arrest, CPR, and defibrillation.   consider starting low-dose metoprolol, Ace/ARB   Dr. Graciela Husbands from EP  To consult tomorrow  2. Acute hypoxic respiratory failure:  now extubated/ yesterday, sedation off  3. Acute systolic CHF: -Severely reduced LVEF by LV gram   And by echocardiogram August 22 EF of 10-15% with global hypokinesis -Repeat echo 8/27 showed improved EF to 55-60%, no RWMA  treated with milrinone  as above, consider starting low-dose metoprolol, ACE/ARB  4. Cardiogenic shock: -Resolved  5. AKI: - Resolved  Signed, Signed: Dossie Arbour  M.D., Ph.D. [facility]

## 2017-03-12 NOTE — Progress Notes (Signed)
OT Cancellation Note  Patient Details Name: Miranda [name] MRN: [id number] DOB: 1960/09/17   Cancelled Treatment:    Reason Eval/Treat Not Completed: Other (comment). Upon attempt this afternoon, pt very fatigued. Opened eyes briefly to locate OT and then went back to sleep. Spoke with RN, pt is fatigued, family members left and lights down low to allow pt to rest. Plan to transfer to 2nd floor shortly. Will re-attempt next date.   Richrd Prime, MPH, MS, OTR/L ascom [phone] 03/12/17, 2:48 PM

## 2017-03-12 NOTE — Progress Notes (Addendum)
Writer spoke with NP Annabelle Harman re poor PO intake today, and concerns re her K levels.  ST will probably take pt to radiology tomorrow AM for barium.  Pt states "I'm thirsty but I don't want to drink anything because I have diarrhea."  Pt was cautioned by writer to take in more fluid, not less.  We will not start IV fluids at this time per [facility], as pt has required diuresis at times during this hospitalization.  Will report to 2A RN to continue to push fluids.  Pt is having incontinent and continent episodes that are almost all yellow fluid with miniscule solid matter, nothing that can be sent to the lab for GI panel, and appears to mostly be urine  Per Infection Control patient has to be on enteric precautions because GI MD has ordered the GI panel.  Enteric isolation order re-entered

## 2017-03-12 NOTE — Progress Notes (Signed)
eLink Physician-Brief Progress Note Patient Name: Miranda [name] DOB: 03/06/1961 MRN: [id number]   Date of Service  03/12/2017  HPI/Events of Note  k low No oral access yet  Replace K via line  eICU Interventions       Intervention Category Major Interventions: Electrolyte abnormality - evaluation and management  Nelda Bucks. 03/12/2017, 5:58 AM

## 2017-03-12 NOTE — Progress Notes (Signed)
Patient transferred to RM 244, report called to [place].  No current infusions, K 3.7, PICC saline locked, no supp O2 requirements.  Patient bathed before transfer d/t mild incontinent episode.  Belongings and chart transferred with her.  No current meds in her bin

## 2017-03-12 NOTE — Progress Notes (Signed)
MEDICATION RELATED CONSULT NOTE  Pharmacy Consult for Electrolyte Monitoring Indication: Hypomagnesemia  Allergies  Allergen Reactions  . Ace Inhibitors   . Compazine [Prochlorperazine Edisylate]   . Lisinopril   . Penicillins     Has patient had a PCN reaction causing immediate rash, facial/tongue/throat swelling, SOB or lightheadedness with hypotension: Unknown Has patient had a PCN reaction causing severe rash involving mucus membranes or skin necrosis: Unknown Has patient had a PCN reaction that required hospitalization: Unknown Has patient had a PCN reaction occurring within the last 10 years: Unknown If all of the above answers are "NO", then may proceed with Cephalosporin use.     Patient Measurements: Height: 5\' 2"  (157.5 cm) Weight: 183 lb 6.8 oz (83.2 kg) IBW/kg (Calculated) : 50.1  Vital Signs: Temp: 98.6 F (37 C) (09/05 1614) Temp Source: Oral (09/05 1614) BP: 151/69 (09/05 1614) Pulse Rate: 73 (09/05 1614) Intake/Output from previous day: 09/04 0701 - 09/05 0700 In: 509.2 [P.O.:200; I.V.:199.2; NG/GT:110] Out: 850 [Urine:800; Stool:50] Intake/Output from this shift: Total I/O In: 200 [IV Piggyback:200] Out: 100 [Urine:100]  Labs:  Recent Labs  03/10/17 0530 03/11/17 0529 03/11/17 0759 03/12/17 0356  WBC 9.5  --   --   --   HGB 9.9*  --   --   --   HCT 29.2*  --   --   --   PLT 317  --   --   --   CREATININE 0.43* 0.34*  --  0.52  MG 1.9 1.7  --  1.8  PHOS 4.1 16.7* 4.1 3.1   Estimated Creatinine Clearance: 76.6 mL/min (by C-G formula based on SCr of 0.52 mg/dL).  Sodium (mmol/L)  Date Value  03/12/2017 143  08/16/2016 141   Potassium (mmol/L)  Date Value  03/12/2017 3.7   Calcium (mg/dL)  Date Value  92/44/6286 8.4 (L)   Albumin (g/dL)  Date Value  38/17/7116 2.3 (L)    Assessment: 56 y/o F s/p cardiac arrest and hypothermia protocol with hypomagnesemia. Due to high risk of recurrent arrhythmia, cardiology wants a goal K of >  or = 4 and Mg > or = 2. Patient has received potassium IV Q1hr x 3 doses, potassium PO BID, and magnesium 2g IV x 1 on 9/5. Per GI patient is not to receive oral magnesium replacement.   Plan:  Will order additional potassium 40 mEq PO x 1.   Will recheck electrolytes with am labs.   Pharmacy will continue to monitor and adjust per consult.   Simpson,Michael L 03/12/2017,4:40 PM

## 2017-03-12 NOTE — Progress Notes (Signed)
SLP Cancellation Note  Patient Details Name: Miranda [name] MRN: [id number] DOB: 1961/05/30   Cancelled treatment:       Reason Eval/Treat Not Completed: Medical issues which prohibited therapy;Patient declined, no reason specified (chart reviewed; NSG consulted; met w/ pt). Pt declined any oral intake(x2) d/t having significant diarrhea. Pt stated she was swallowing pleasure applesauce and yogurt w/ NSG w/ no difficulty - NSG endorsed same. Recommend continue such w/ aspiration precautions and supervision. ST services will f/u w/ pt's status tomorrow for ongoing swallowing assessment in order to initiate an oral diet if possible. NSG informed.    Orinda Kenner, MS, CCC-SLP Kamla Skilton 03/12/2017, 2:19 PM

## 2017-03-12 NOTE — Progress Notes (Signed)
Nutrition Follow-up  DOCUMENTATION CODES:   Obesity unspecified  INTERVENTION:  Diet was unable to be advanced today as patient did not participate in SLP assessment.  Once diet able to be advanced, will adjust diet to be low-fiber and low-fat with restriction of lactose, sweeteners, and artificial sweeteners. Will provide patient with handout on diet and menu with options available to her. Plan is for patient to be on this diet short-term to see if her diarrhea is osmotic in nature.  Once patient able to return to a more regular diet, she will benefit from an oral nutrition supplement to help meet her calorie/protein needs and prevent loss of lean body mass.  NUTRITION DIAGNOSIS:   Inadequate oral intake related to inability to eat as evidenced by NPO status.  Ongoing - diet unable to be advanced today.  GOAL:   Patient will meet greater than or equal to 90% of their needs  Not met - patient has been unable to be placed on a diet after extubation.  MONITOR:   PO intake, I & O's, Labs, Supplement acceptance, Weight trends, Diet advancement  REASON FOR ASSESSMENT:   Ventilator    ASSESSMENT:   56 year old female with PMHx of HTN, migraines, edema to bilateral legs. Patient had diarrhea for 2-3 days PTA and was taking up to 12 imodium per day, staying well-hydrated, and stopped taking antihypertensives. She suffered V-fib arrest at home, was intubated in the field after ROSC.  -Patient was extubated to 2L [place] on 9/4 at [place]. OGT was removed. -Rectal tube was removed 9/4. -Diet has been unable to be advanced by SLP. Patient was assessed on 9/4 and was only started on pleasure PO intake of applesauce and yogurt under supervision of nursing. SLP was unable to assess patient today because she refused any oral intake in setting of her diarrhea. Plan is for [facility] tomorrow.  Received consult today to start patient on BRAT diet (bananas, rice, applesauce, toast) and no artificial  sweeteners in setting of chronic diarrhea. After discussing nutrition plan of care with GI MD, plan will be for patient to start on soft diet (low-fiber, low-fat) with restriction of lactose, sweeteners, and artificial sweeteners to see if diarrhea is osmotic in nature.  Met with patient at bedside. She is able to speak softly. She confirms that she did not want to work with SLP today because of her diarrhea. She reports she is willing to go for her MBS tomorrow to see which diet will be safe for her to start on. Discussed plan for her to start on diet that is low in fiber, fat, lactose, sweeteners, and artificial sweeteners. Patient reports she has had chronic diarrhea PTA. She reports she has up to 12-15 episodes per day of diarrhea. They are not only related to intake of meals - she reports she also has diarrhea overnight. Patient was on bed pan during assessment. When RN removed bed pan did not have any stool/diarrhea - only urine.   Medications reviewed and include: Novolog 0-20 units Q4hrs, potassium chloride 40 mEq BID.  Labs reviewed: CBG 82-104 past 24 hrs, Potassium 3.7 s/p repletion (was 3 on AM labs), BUN 25.  Output: None of the bowel movements charted today have been liquid consistency. At 0400 patient had medium brown type 6 BM. At 0415 patient had medium brown type 6 BM (unsure if same BM was double-charted). At 0800 patient had smear brown type 5 BM. At 1320 patient had small yellow type 5 BM.  Looking back at history of stool output documented, patient had between 100-750 ml output daily in rectal tube while it was in.  Weight trend: 83.2 kg on 03/12/2017 (-5.5 kg from admission weight)  Discussed with RN.  Diet Order:     Skin:  Reviewed, no issues  Last BM:  03/11/2017 (type 7)  Height:   Ht Readings from Last 1 Encounters:  03/07/17 '5\' 2"'  (1.575 m)    Weight:   Wt Readings from Last 1 Encounters:  03/12/17 183 lb 6.8 oz (83.2 kg)    Ideal Body Weight:  50 kg  BMI:   Body mass index is 33.55 kg/m.  Estimated Nutritional Needs:   Kcal:  1645-1915 (MSJ x 1.2-1.4)  Protein:  85-100 grams (1-1.2 grams/kg)  Fluid:  1.5-1.75 L/day (30-35 ml/kg IBW)  EDUCATION NEEDS:   No education needs identified at this time  Willey Blade, MS, RD, LDN Pager: [phone] After Hours Pager: [phone]

## 2017-03-12 NOTE — Progress Notes (Signed)
Inpatient Rehabilitation  Working on insurance authorization for hopeful IP Rehab admission on Friday, 03/14/17. Insurance case manager has requested updated PT and OT notes for tomorrow.  I have notified Marylene Land, RN CM, who will notify therapy team of request.  Please call with questions.   Charlane Ferretti., CCC/SLP Admission Coordinator  [facility]patient Rehabilitation  Cell [phone]

## 2017-03-12 NOTE — Progress Notes (Signed)
PT Cancellation Note  Patient Details Name: Miranda [name] MRN: [id number] DOB: [birth year]   Cancelled Treatment:    Reason Eval/Treat Not Completed: Other (comment).  Pt currently in process of transferring to floor and not available for PT session.  Will re-attempt PT tomorrow.  [name], PT 03/12/17, 3:40 PM [phone]

## 2017-03-12 NOTE — Progress Notes (Signed)
Inpatient Rehabilitation  Met with patient at bedside to discuss team's recommendation for IP Rehab.  Shared booklet and answered questions.  Patient reports being eager to regain her independence.  Plan to follow up with spouse later to answer questions.  Will continue to follow for timing of medical readiness, insurance authorization, and IP Rehab bed availability.  Please call with questions.   Carmelia Roller., CCC/SLP Admission Coordinator  [place]  Cell [phone]

## 2017-03-12 NOTE — Progress Notes (Signed)
Patient is alert and able to make needs known. Patient has maintained oxygen saturation throughout the night on room air. Patient has had a couple of incontinent episodes of diarrhea in bed and insisted on getting up out of bed to the [facility]. Patient able to pivot with assist of 2. Patient became progressively weaker before going to bed and despite encouragement to use a bedpan, she refused. Two daughters with her in room throughout the night. Patient had a low-grade temp of 99.8 earlier in the shift and was given tylenol, effective for relief. Suggested to patient to turn the heat in the room down a little and not have 3 blankets on, but she insisted on keeping it that way. Per SLP suggestions, patient was able to eat yogurt and applesauce. She took her meds whole last night in applesauce.

## 2017-03-12 NOTE — Consult Note (Signed)
Wyline Mood MD, MRCP(U.K) [address]  [address]  [place], [place] [address]  Main: [phone]  Fax: [phone]  Consultation  Referring Provider:     No ref. provider found Primary Care Physician:  Merwyn Katos, MD Primary Gastroenterologist:  None      Reason for Consultation:   Diarrhea   Date of Admission:  02/25/2017 Date of Consultation:  03/12/2017         HPI:   Miranda [name] 56 y.o. 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 She was subsequently intubated . Prior to admission was having diarrhea for a few days preceding her admission and was taking multiple tablets of imodium. EMS was called when she was unresponsive at home, EMS found her in V fibb , shocked , negative cardiac cath , was in shock , pressor support , extubated . Felt by cardiology that her  V fibb arrest was due to low electrolytes Oncology admission K was 2.9 CT-scan of the chest done 03/10/17 shows multifocal pneumonia .Admission HNP suggests she was on olmesartan and ibuprofen .  Today when I went to see her in the ICU she was in the company of friends and family who are all nurses. Her daughter stated that she has had chronic diarrhea for over 20 years, a week prior to admission she was visiting [place] , ate a lot of fast food and right after that started having severe diarrhea which was non bloody and subsequently led to this admission. She started Olmesartan within the last 6 months. She consumes a lot of sweetened tea and drinks at [facility] which has a lot of sugar natural and artificial. Denies chewing any gum. At her table she had some sugar free jello when I walked in. She had a rectal tube till last evening and since has been taken out, had some diarrhea at night unsure of the qty. Last colonoscopy was >20 years back and EGD 6 years back, no family history of IBD or Celiac disease.   No other complaints presently.    Past Medical  History:  Diagnosis Date  . Dependent edema    bilateral legs  . Hot flashes   . HTN (hypertension)   . Migraines   . Nausea     Past Surgical History:  Procedure Laterality Date  . APPENDECTOMY  1974  . CHOLECYSTECTOMY  1984  . LEFT HEART CATH AND CORONARY ANGIOGRAPHY N/A 02/26/2017   Procedure: LEFT HEART CATH AND CORONARY ANGIOGRAPHY;  Surgeon: Yvonne Kendall, MD;  Location: [facility] INVASIVE CV LAB;  Service: Cardiovascular;  Laterality: N/A;  . TONSILLECTOMY  1964    Prior to Admission medications   Medication Sig Start Date End Date Taking? Authorizing Provider  acetaminophen (TYLENOL) 500 MG tablet Take 1,000 mg by mouth every 6 (six) hours as needed.   Yes [provider]  amLODipine (NORVASC) 10 MG tablet Take 10 mg by mouth daily.   Yes [provider]  hydrochlorothiazide (HYDRODIURIL) 25 MG tablet Take 1 tablet (25 mg total) by mouth daily. 08/16/16 02/26/18 Yes Gollan, Tollie Pizza, MD  ibuprofen (ADVIL,MOTRIN) 200 MG tablet Take 600 mg by mouth every 6 (six) hours as needed.   Yes [provider]  olmesartan (BENICAR) 40 MG tablet Take 1 tablet (40 mg total) by mouth daily. 01/16/17  Yes Gollan, Tollie Pizza, MD  ondansetron (ZOFRAN-ODT) 4 MG disintegrating tablet Take 4 mg by mouth every 8 (  eight) hours as needed for nausea. 01/13/17  Yes [provider]  Olmesartan-Amlodipine-HCTZ 40-10-25 MG TABS Take 1 tablet by mouth daily. Patient not taking: Reported on 02/26/2017 08/20/16   Antonieta Iba, MD  potassium chloride (K-DUR) 10 MEQ tablet Take 1 tablet (10 mEq total) by mouth daily as needed. Patient not taking: Reported on 02/26/2017 08/20/16 11/18/16  Antonieta Iba, MD    Family History  Problem Relation Age of Onset  . Lymphoma Mother   . Heart disease Father   . Stroke Father   . Hyperlipidemia Father   . Hypertension Father   . Hypertension Sister   . Migraines Sister   . Hypertension Brother      Social History  Substance Use  Topics  . Smoking status: Never Smoker  . Smokeless tobacco: Never Used  . Alcohol use No    Allergies as of 02/25/2017 - Review Complete 02/25/2017  Allergen Reaction Noted  . Ace inhibitors  08/13/2016  . Compazine [prochlorperazine edisylate]  08/13/2016  . Lisinopril  08/13/2016  . Penicillins  08/13/2016    Review of Systems:    All systems reviewed and negative except where noted in HPI.   Physical Exam:  Vital signs in last 24 hours: Temp:  [98 F (36.7 C)-99.3 F (37.4 C)] 99.3 F (37.4 C) (09/05 0400) Pulse Rate:  [68-95] 83 (09/05 0500) Resp:  [18-32] 22 (09/05 0500) BP: (98-152)/(48-120) 141/59 (09/05 0500) SpO2:  [91 %-100 %] 91 % (09/05 0500) Weight:  [183 lb 6.8 oz (83.2 kg)] 183 lb 6.8 oz (83.2 kg) (09/05 0403) Last BM Date: 03/11/17 General:   Pleasant, cooperative in NAD Head:  Normocephalic and atraumatic. Eyes:   No icterus.   Conjunctiva pink. PERRLA. Ears:  Normal auditory acuity. Neck:  Supple; no masses or thyroidomegaly Lungs: Respirations even and unlabored. Lungs clear to auscultation bilaterally.   No wheezes, crackles, or rhonchi.  Heart:  Regular rate and rhythm;  Without murmur, clicks, rubs or gallops Abdomen:  Soft, nondistended, nontender. Normal bowel sounds. No appreciable masses or hepatomegaly.  No rebound or guarding.  Neurologic:  Alert and oriented x3;  grossly normal neurologically. Skin:  Intact without significant lesions or rashes. Cervical Nodes:  No significant cervical adenopathy. Psych:  Alert and cooperative. Normal affect.  LAB RESULTS:  Recent Labs  03/10/17 0530  WBC 9.5  HGB 9.9*  HCT 29.2*  PLT 317   BMET  Recent Labs  03/10/17 0530 03/11/17 0529 03/12/17 0356  NA 143 141 143  K 4.2 3.6 3.0*  CL 107 99* 108  CO2 29 25 27   GLUCOSE 133* 299* 92  BUN 27* 30* 25*  CREATININE 0.43* 0.34* 0.52  CALCIUM 8.7* 8.1* 8.4*   LFT No results for input(s): PROT, ALBUMIN, AST, ALT, ALKPHOS, BILITOT, BILIDIR,  IBILI in the last 72 hours. PT/INR No results for input(s): LABPROT, INR in the last 72 hours.  STUDIES: Ct Chest Wo Contrast  Result Date: 03/10/2017 CLINICAL DATA:  Acute respiratory illness EXAM: CT CHEST WITHOUT CONTRAST TECHNIQUE: Multidetector CT imaging of the chest was performed following the standard protocol without IV contrast. COMPARISON:  Radiograph 03/10/2017, CT chest 02/25/2017 FINDINGS: Cardiovascular: Limited evaluation without intravenous contrast. Right upper extremity catheter tip positioned at the cavoatrial junction. Mild atherosclerotic calcification. Minimal coronary artery calcification. Borderline to mild cardiomegaly. Negative for large pericardial effusion. Mediastinum/Nodes: Stable small mediastinal lymph nodes. Largest is seen in the right paratracheal space and measures 9 mm. Endotracheal tube tip terminates just  above the carina. Esophageal tube tip visualized to the mid stomach. Lungs/Pleura: Negative for pneumothorax or significant pleural effusion. Scattered bilateral ground-glass densities including a more focused area of consolidation in the peripheral right upper lobe, corresponding to the radiographic abnormality overall the lung aeration has improved since prior CT from 02/25/2017. Upper Abdomen: Surgical clips in the gallbladder fossa. No acute abnormality. Musculoskeletal: Motion artifact causes false appearance of lower and upper sternal fracture. Acute right fifth anterior rib fracture. IMPRESSION: 1. Mild to moderate scattered bilateral ground-glass densities, favored to represent multifocal pneumonia, overall the aeration of the lung fields is improved compared to the prior chest CT. 2. Endotracheal tube tip is just above the carina 3. Acute to subacute right fifth anterior rib fracture Aortic Atherosclerosis (ICD10-I70.0). Electronically Signed   By: Jasmine Pang M.D.   On: 03/10/2017 14:21   Dg Chest Port 1 View  Result Date: 03/10/2017 CLINICAL DATA:  Acute  respiratory failure, ventilatory support EXAM: PORTABLE CHEST 1 VIEW COMPARISON:  03/09/2017 FINDINGS: Endotracheal tube is 3.4 cm above the carina. NG tube within the stomach, tip not visualized. Right PICC line tip mid SVC level. Stable cardiomegaly with vascular congestion and diffuse mixed airspace and interstitial opacities versus edema. No enlarging effusion or pneumothorax. Right peripheral mid lung nodular opacity remains indeterminate for superimposed shadows or lung nodule. Recommend attention on follow-up exams. IMPRESSION: Stable cardiomegaly with similar diffuse mixed interstitial and airspace opacities, suspicious for infection versus edema. No enlarging effusion or pneumothorax Possible right midlung peripheral lung nodule. Electronically Signed   By: Judie Petit.  Shick M.D.   On: 03/10/2017 09:29      Impression / Plan:   Miranda [name] 56 y.o. female admitted to [facility] on 02/26/17 with V fibb arrest , fel;t likely secondary to low electrolytes from acute diarrhea . Presently has some diarrhea , unsure of qty . Diet rich in artificial sugars and fructose which can cause an osmotic diarrhea. She does suffer from a chronic diarrhea with recent worsening which could have been due to food poisoning .    Plan  1. Stop consumption of all artificial sugars and sweeteners , diet to be low in fructose , no dairy for now. Suggest consulting dietician and commence on BRAT diet .  2. Record all stools to determine if she is improving 3. Avoid oral magnesium to supplement as it can cause diarrhea  4. Check stool for C diff, PCR, fecal calpropectin  5. Check Celiac serology  6. Colonoscopy+ EGD likely as an out patient as she is recovering from a pneumonia and still coughing  7. Suggest changing from Olmesartan to a different drug for HTN, can be a different ace inhibitor as Olmesartan is known to cause microscopic colitis.  8. If diarrhea continues - next step would be to check stool osmolalities/osmolar  gap+/- MR enterogram  Thank you for involving me in the care of this patient.      LOS: 14 days   Wyline Mood, MD  03/12/2017, 8:37 AM

## 2017-03-12 NOTE — Care Management (Signed)
RNCM received call from Fae Pippin with CIR -   Auth started.  PT notes needed on daily basis now.

## 2017-03-12 NOTE — Progress Notes (Signed)
Per Dr Tobi Bastos, DC CDiff PCR and iso d/t patient has already been checked for [facility]

## 2017-03-13 LAB — BASIC METABOLIC PANEL
ANION GAP: 8 (ref 5–15)
BUN: 23 mg/dL — ABNORMAL HIGH (ref 6–20)
CALCIUM: 8.5 mg/dL — AB (ref 8.9–10.3)
CO2: 25 mmol/L (ref 22–32)
Chloride: 112 mmol/L — ABNORMAL HIGH (ref 101–111)
Creatinine, Ser: 0.45 mg/dL (ref 0.44–1.00)
Glucose, Bld: 106 mg/dL — ABNORMAL HIGH (ref 65–99)
Potassium: 4.1 mmol/L (ref 3.5–5.1)
SODIUM: 145 mmol/L (ref 135–145)

## 2017-03-13 LAB — GASTROINTESTINAL PANEL BY PCR, STOOL (REPLACES STOOL CULTURE)
ADENOVIRUS F40/41: NOT DETECTED
Astrovirus: NOT DETECTED
CAMPYLOBACTER SPECIES: NOT DETECTED
CRYPTOSPORIDIUM: NOT DETECTED
CYCLOSPORA CAYETANENSIS: NOT DETECTED
Entamoeba histolytica: NOT DETECTED
Enteroaggregative E coli (EAEC): NOT DETECTED
Enteropathogenic E coli (EPEC): NOT DETECTED
Enterotoxigenic E coli (ETEC): NOT DETECTED
Giardia lamblia: NOT DETECTED
Norovirus GI/GII: NOT DETECTED
PLESIMONAS SHIGELLOIDES: NOT DETECTED
Rotavirus A: NOT DETECTED
SALMONELLA SPECIES: NOT DETECTED
SAPOVIRUS (I, II, IV, AND V): NOT DETECTED
SHIGELLA/ENTEROINVASIVE E COLI (EIEC): NOT DETECTED
Shiga like toxin producing E coli (STEC): NOT DETECTED
VIBRIO SPECIES: NOT DETECTED
Vibrio cholerae: NOT DETECTED
YERSINIA ENTEROCOLITICA: NOT DETECTED

## 2017-03-13 LAB — MAGNESIUM: MAGNESIUM: 2 mg/dL (ref 1.7–2.4)

## 2017-03-13 LAB — GLIA (IGA/G) + TTG IGA
Antigliadin Abs, IgA: 6 units (ref 0–19)
GLIADIN IGG: 2 U (ref 0–19)

## 2017-03-13 LAB — GLUCOSE, CAPILLARY
GLUCOSE-CAPILLARY: 89 mg/dL (ref 65–99)
GLUCOSE-CAPILLARY: 96 mg/dL (ref 65–99)
Glucose-Capillary: 94 mg/dL (ref 65–99)
Glucose-Capillary: 88 mg/dL (ref 65–99)
Glucose-Capillary: 76 mg/dL (ref 65–99)
Glucose-Capillary: 84 mg/dL (ref 65–99)

## 2017-03-13 MED ORDER — QUETIAPINE FUMARATE 25 MG PO TABS
25.0000 mg | ORAL_TABLET | Freq: Every day | ORAL | Status: DC
  Administered 2017-03-13: 25 mg via ORAL
  Filled 2017-03-13: qty 1

## 2017-03-13 MED ORDER — PANCRELIPASE (LIP-PROT-AMYL) 12000-38000 UNITS PO CPEP
72000.0000 [IU] | ORAL_CAPSULE | Freq: Three times a day (TID) | ORAL | Status: DC
  Administered 2017-03-13 – 2017-03-14 (×5): 72000 [IU] via ORAL
  Filled 2017-03-13 (×5): qty 6

## 2017-03-13 MED ORDER — LOPERAMIDE HCL 2 MG PO CAPS
4.0000 mg | ORAL_CAPSULE | Freq: Two times a day (BID) | ORAL | Status: DC
  Administered 2017-03-13 (×2): 4 mg via ORAL
  Filled 2017-03-13 (×2): qty 2

## 2017-03-13 NOTE — Progress Notes (Signed)
MEDICATION RELATED CONSULT NOTE  Pharmacy Consult for Electrolyte Monitoring Indication: Hypomagnesemia and hypokalemia  Allergies  Allergen Reactions  . Ace Inhibitors   . Compazine [Prochlorperazine Edisylate]   . Lisinopril   . Penicillins     Has patient had a PCN reaction causing immediate rash, facial/tongue/throat swelling, SOB or lightheadedness with hypotension: Unknown Has patient had a PCN reaction causing severe rash involving mucus membranes or skin necrosis: Unknown Has patient had a PCN reaction that required hospitalization: Unknown Has patient had a PCN reaction occurring within the last 10 years: Unknown If all of the above answers are "NO", then may proceed with Cephalosporin use.    Labs:  Recent Labs  03/11/17 0529 03/11/17 0759 03/12/17 0356 03/13/17 0416  CREATININE 0.34*  --  0.52 0.45  MG 1.7  --  1.8 2.0  PHOS 16.7* 4.1 3.1  --    Estimated Creatinine Clearance: 76.8 mL/min (by C-G formula based on SCr of 0.45 mg/dL).  Sodium (mmol/L)  Date Value  03/13/2017 145  08/16/2016 141   Potassium (mmol/L)  Date Value  03/13/2017 4.1   Calcium (mg/dL)  Date Value  44/31/5400 8.5 (L)   Albumin (g/dL)  Date Value  86/76/1950 2.3 (L)    Assessment: 56 y/o F s/p cardiac arrest and hypothermia protocol with hypomagnesemia and hypokalemia.  Due to high risk of recurrent arrhythmia, cardiology wants a goal K of > or = 4 and Mg > or = 2. Per GI patient is not to receive oral magnesium replacement.   Plan:  Mg = 2 which is at goal. No replacement necessary. K = 4.1 which is at goal. Patient has current order for KCl 40 mEq PO BID for 2 more doses.  Will recheck electrolytes with am labs.  Pharmacy will continue to monitor and adjust per consult.   Cindi Carbon, PharmD, BCPS Clinical Pharmacist 03/13/2017,10:39 AM

## 2017-03-13 NOTE — Progress Notes (Signed)
Chaplain made a follow up with pt. Pt was lying in the bed at the time of this visit. Pt appeared exhausted or down this afternoon. Pt was nonverbal but whispers to the [facility] that she needed to have rest. CH to follow with pt as needed.   03/13/17 1200  Clinical Encounter Type  Visited With Patient  Visit Type Follow-up  Referral From Chaplain  Consult/Referral To Chaplain  Spiritual Encounters  Spiritual Needs Prayer;Emotional

## 2017-03-13 NOTE — Progress Notes (Signed)
OT Cancellation Note  Patient Details Name: Miranda [name] MRN: [id number] DOB: 1960-09-05   Cancelled Treatment:    Reason Eval/Treat Not Completed: Patient at procedure or test/ unavailable. Upon attempt to treat this morning, pt with NSG for toileting, requesting OT come back. Will re-attempt OT treatment this afternoon.  Richrd Prime, MPH, MS, OTR/L ascom [phone] 03/13/17, 11:20 AM

## 2017-03-13 NOTE — Progress Notes (Signed)
Jonathon Bellows MD, MRCP(U.K) [address]  [place]  [place], [place] [address]  Main: [place] is being followed for diarrhea  Day 1 of follow up   Subjective: Patient not clear if getting better- having diarrhea but unsure of qty if same , more or less    Objective: Vital signs in last 24 hours: Vitals:   03/13/17 0216 03/13/17 0431 03/13/17 0737 03/13/17 1039  BP:  (!) 143/63  (!) 133/56  Pulse:  71  71  Resp:  18    Temp:  98.8 F (37.1 C)  98.4 F (36.9 C)  TempSrc:  Oral  Oral  SpO2: 97% 97% 98% 100%  Weight:  184 lb 4.9 oz (83.6 kg)    Height:       Weight change: 14.1 oz (0.4 kg)  Intake/Output Summary (Last 24 hours) at 03/13/17 1108 Last data filed at 03/12/17 1110  Gross per 24 hour  Intake                0 ml  Output              100 ml  Net             -100 ml     Exam: Heart:: Regular rate and rhythm, S1S2 present or without murmur or extra heart sounds Lungs: normal, clear to auscultation and clear to auscultation and percussion Abdomen: soft, nontender, normal bowel sounds   Lab Results: _0 @ Micro Results: Recent Results (from the past 240 hour(s))  C difficile quick scan w PCR reflex     Status: None   Collection Time: 03/04/17 11:40 AM  Result Value Ref Range Status   C Diff antigen NEGATIVE NEGATIVE Final   C Diff toxin NEGATIVE NEGATIVE Final   C Diff interpretation No C. difficile detected.  Final  Culture, bal-quantitative     Status: None   Collection Time: 03/07/17 12:37 PM  Result Value Ref Range Status   Specimen Description TRACHEAL ASPIRATE  Final   Special Requests Normal  Final   Gram Stain   Final    ABUNDANT WBC PRESENT, PREDOMINANTLY PMN NO ORGANISMS SEEN    Culture   Final    NO GROWTH 2 DAYS Performed at [facility], [address], [place], [place] [address]    Report Status 03/09/2017 FINAL  Final  Gastrointestinal Panel by PCR , Stool     Status: None   Collection Time:  03/13/17  8:10 AM  Result Value Ref Range Status   Campylobacter species NOT DETECTED NOT DETECTED Final   Plesimonas shigelloides NOT DETECTED NOT DETECTED Final   Salmonella species NOT DETECTED NOT DETECTED Final   Yersinia enterocolitica NOT DETECTED NOT DETECTED Final   Vibrio species NOT DETECTED NOT DETECTED Final   Vibrio cholerae NOT DETECTED NOT DETECTED Final   Enteroaggregative E coli (EAEC) NOT DETECTED NOT DETECTED Final   Enteropathogenic E coli (EPEC) NOT DETECTED NOT DETECTED Final   Enterotoxigenic E coli (ETEC) NOT DETECTED NOT DETECTED Final   Shiga like toxin producing E coli (STEC) NOT DETECTED NOT DETECTED Final   Shigella/Enteroinvasive E coli (EIEC) NOT DETECTED NOT DETECTED Final   Cryptosporidium NOT DETECTED NOT DETECTED Final   Cyclospora cayetanensis NOT DETECTED NOT DETECTED Final   Entamoeba histolytica NOT DETECTED NOT DETECTED Final   Giardia lamblia NOT DETECTED NOT DETECTED Final   Adenovirus F40/41 NOT DETECTED NOT DETECTED Final   Astrovirus  NOT DETECTED NOT DETECTED Final   Norovirus GI/GII NOT DETECTED NOT DETECTED Final   Rotavirus A NOT DETECTED NOT DETECTED Final   Sapovirus (I, II, IV, and V) NOT DETECTED NOT DETECTED Final   Studies/Results: No results found. Medications: I have reviewed the patient's current medications. Scheduled Meds: . enoxaparin (LOVENOX) injection  40 mg Subcutaneous Q24H  . insulin aspart  0-20 Units Subcutaneous Q4H  . ipratropium-albuterol  3 mL Nebulization Q6H  . lipase/protease/amylase  72,000 Units Oral TID AC  . loperamide  4 mg Oral BID AC  . potassium chloride  40 mEq Oral BID  . QUEtiapine  50 mg Per Tube QHS  . sodium chloride flush  10-40 mL Intracatheter Q12H   Continuous Infusions: PRN Meds:.acetaminophen, atropine, sennosides **AND** docusate, levalbuterol, naLOXone (NARCAN)  injection, pneumococcal 23 valent vaccine, sodium chloride flush   Assessment: Principal Problem:   Cardiac arrest  with ventricular fibrillation (HCC) Active Problems:   Cardiac arrest ([place])   Acute respiratory failure (HCC)   Hypokalemia   Encounter for central line placement   Cardiogenic shock ([place])   Acute pulmonary edema ([place])   Anoxic encephalopathy ([place])   Bradycardia   Miranda [name] 56 y.o. female admitted to [facility] on 02/26/17 with V fibb arrest , fel;t likely secondary to low electrolytes from acute diarrhea . Presently has some diarrhea , unsure of qty . Marland Kitchen She does suffer from a chronic diarrhea with recent worsening which could have been due to food poisoning .    Plan  1. Diet to be low in fructose , no dairy for now.  2. Record all stools to determine if she is improving 3. Avoid oral magnesium to supplement as it can cause diarrhea  4. F/u  fecal calpropectin and Celiac serology  5. Colonoscopy+ EGD likely as an out patient as she is recovering from a pneumonia a7. Suggest changing from Olmesartan to a different drug for HTN, can be a different ace inhibitor as Olmesartan is known to cause microscopic colitis.  6. Commence on imodium and CREON , if still not better will consider MR enterogram based on stool studies   Thank you for involving me in the care of this patient.    LOS: 15 days   Jonathon Bellows 03/13/2017, 11:08 AM

## 2017-03-13 NOTE — Progress Notes (Signed)
Inpatient Rehabilitation  Aware of pending transport to [facility] for completion of cardiac work-up.  I have updated [employer] and will re-open case closer to time of anticipated medical readiness.  Plan to continue to follow along after transfer as well.  Please call with questions.   Charlane Ferretti., CCC/SLP Admission Coordinator  [facility] Inpatient Rehabilitation  Cell [phone]

## 2017-03-13 NOTE — Progress Notes (Addendum)
Nutrition Follow-up  DOCUMENTATION CODES:   Obesity unspecified  INTERVENTION:  Updated diet order to be soft (low-fiber, low-fat), lactose-free, no sweeteners or artificial sweeteners. Provided patient and diet office (who handles ordering of meals) copy of personalized menu with items available to patient. Discussed with patient reasoning of diet (to determine if her diarrhea is osmotic in nature) and that it will only be a short-term diet.  Reviewed "Diarrhea Nutrition Therapy" with patient.  Once diet able to return to more normal diet following GI work-up, patient would benefit from oral nutrition supplement to help meet her calorie and protein needs. She will likely continue to have poor oral intake as she is afraid to eat in setting of her diarrhea. Would benefit from encouragement to order food and eat at each meal.  NUTRITION DIAGNOSIS:   Inadequate oral intake related to poor appetite, other (see comment) (diarrhea) as evidenced by per patient/family report.  New nutrition diagnosis.  Inadequate oral intake related to inability to eat as evidenced by NPO status.  Resolved - patient's diet now advanced.  GOAL:   Patient will meet greater than or equal to 90% of their needs  Not met.  MONITOR:   PO intake, Supplement acceptance, Diet advancement, Labs, Weight trends, I & O's  REASON FOR ASSESSMENT:   Consult Assessment of nutrition requirement/status  ASSESSMENT:   56 year old female with PMHx of HTN, migraines, edema to bilateral legs. Patient had diarrhea for 2-3 days PTA and was taking up to 12 imodium per day, staying well-hydrated, and stopped taking antihypertensives. She suffered V-fib arrest at home, was intubated in the field after ROSC. Extubated on 9/4.    -SLP was able to work with patient today. Cleared her for regular texture diet with thin liquids. No straws.   Met with patient at bedside. She reports that yesterday she only had sips of water. Per  chart it appears she had two episodes of liquid bowel movements overnight. At 2124 last night she had medium green type 7 bowel movement. At [place] this AM she had medium green type 7 bowel movement. That was after only sipping on water yesterday. It appears actual volume of stool was not able to be measured. Her bowel movements yesterday had not been liquid consistency (were all type 5 or type 6 on [place] stool chart).  Per review of [employer], patient did not want to eat breakfast this morning. Concerned she will not eat out of fear of worsening diarrhea. Encouraged her to eat the foods allowed on her current diet. Discussed how she needs protein and calories to have enough strength for rehab and to prevent loss of lean body mass.  Medications reviewed and include: Novolog 0-20 units Q4hrs, Creon 72000 units TID (newly ordered today by GI), Imodium 4 mg BID before lunch and dinner (newly ordered today by GI), potassium chloride 40 mEq BID.  Labs reviewed: CBG 88-106, Chloride 112, BUN 23.  Discussed with RN.  Diet Order:  DIET SOFT Room service appropriate? Yes; Fluid consistency: Thin  Skin:  Reviewed, no issues  Last BM:  03/11/2017 (type 7)  Height:   Ht Readings from Last 1 Encounters:  03/07/17 5' 2" (1.575 m)    Weight:   Wt Readings from Last 1 Encounters:  03/13/17 184 lb 4.9 oz (83.6 kg)    Ideal Body Weight:  50 kg  BMI:  Body mass index is 33.71 kg/m.  Estimated Nutritional Needs:   Kcal:  1645-1915 (MSJ x 1.2-1.4)  Protein:  85-100 grams (1-1.2 grams/kg)  Fluid:  1.5-1.75 L/day (30-35 ml/kg IBW)  EDUCATION NEEDS:   Education needs addressed  Willey Blade, MS, RD, LDN Pager: [phone] After Hours Pager: [phone]

## 2017-03-13 NOTE — Progress Notes (Signed)
Physical Therapy Treatment Patient Details Name: Miranda [name] MRN: [id number] DOB: Feb 02, 1961 Today's Date: 03/13/2017    History of Present Illness 56 yo female pt presented to ER after unresponsive episode/cardiac arrest in home; admitted for hospitalization secondary to cardiac arrest with ventricular fibrillation (requiring shock x3 prior to ROSC), status post hypothermia protocol (initiated 8/21, rewarming initiated 8/25). Underwent emergent cardiac cath upon arrival to facility, with no significant disease noted, but with significantly reduced EF (15%).  Failed initial extubation 8/25 requiring reintubation within minutes but successfully extubated AM 03/11/17.  MRI negative for acute insult, but some discussion of possible anoxic brain injury noted in chart.     PT Comments    Pt sleeping upon arrival but woken easily and agreeable to PT session.  Pt's friend present and reporting pt has not been eating much.  Pt reporting fatigue in general (from functional activity earlier this morning) but appearing motivated to participate in PT.  Tolerated LE ex's in bed well.  Pt tending to sit quickly and only able to march 1x with R LE (standing with RW) before needing to sit down d/t LE weakness.  Will continue to focus on strengthening, balance, and progressing ambulation distance per pt tolerance.   Follow Up Recommendations  CIR     Equipment Recommendations  Rolling walker with 5" wheels    Recommendations for Other Services       Precautions / Restrictions Precautions Precautions: Fall Precaution Comments: R PICC Restrictions Weight Bearing Restrictions: No    Mobility  Bed Mobility Overal bed mobility: Needs Assistance Bed Mobility: Supine to Sit     Supine to sit: Min assist;Mod assist     General bed mobility comments: assist for trunk supine to sit; increased effort and time to perform  Transfers Overall transfer level: Needs assistance Equipment used: None;Rolling  walker (2 wheeled) Transfers: Sit to/from Stand Sit to Stand: Min assist;Mod assist Stand pivot transfers: Min assist;Mod assist       General transfer comment: mod assist 1st attempt at standing with UE support via therapist; min to mod assist 2nd attempt standing and stand step turn bed to recliner with B knees blocked and B UE support via therapist (pt sitting quickly upon reaching chair requiring mod assist to control descent); min assist to stand with RW  Ambulation/Gait Ambulation/Gait assistance: Min assist;Mod assist Ambulation Distance (Feet):  (one march with R LE ) Assistive device: Rolling walker (2 wheeled)       General Gait Details: pt attempted to march in place but after marching one time with R LE pt reporting LE weakness and needing to sit down   Stairs            Wheelchair Mobility    Modified Rankin (Stroke Patients Only)       Balance Overall balance assessment: Needs assistance Sitting-balance support: No upper extremity supported Sitting balance-Leahy Scale: Good Sitting balance - Comments: static sitting reaching within BOS   Standing balance support: Bilateral upper extremity supported Standing balance-Leahy Scale: Poor Standing balance comment: requires UE support on RW for static standing                            Cognition Arousal/Alertness: Awake/alert Behavior During Therapy: Impulsive Overall Cognitive Status:  (A&O to person, place situation)  General Comments: Hoarse whisper/mouths words      Exercises Total Joint Exercises Ankle Circles/Pumps: AROM;Strengthening;Both;10 reps;Supine Quad Sets: AROM;Strengthening;Both;10 reps;Supine Short Arc Quad: AROM;Strengthening;Both;10 reps;Supine Heel Slides: AROM;Strengthening;Both;10 reps;Supine Hip ABduction/ADduction: AROM;Strengthening;Both;10 reps;Supine  Vc's required for technique for above ex's.    General Comments  General comments (skin integrity, edema, etc.): Pt sleeping in bed upon PT entry; friend present but left beginning of session.  Nursing cleared pt for participation in physical therapy.  Pt agreeable to PT session.      Pertinent Vitals/Pain Pain Assessment: No/denies pain  HR WFL during session.    Home Living                      Prior Function            PT Goals (current goals can now be found in the care plan section) Acute Rehab PT Goals Patient Stated Goal: to get stronger and more independent PT Goal Formulation: With patient Time For Goal Achievement: 03/25/17 Potential to Achieve Goals: Good Progress towards PT goals: Progressing toward goals    Frequency    7X/week      PT Plan Current plan remains appropriate    Co-evaluation              AM-PAC PT "6 Clicks" Daily Activity  Outcome Measure  Difficulty turning over in bed (including adjusting bedclothes, sheets and blankets)?: Unable Difficulty moving from lying on back to sitting on the side of the bed? : Unable Difficulty sitting down on and standing up from a chair with arms (e.g., wheelchair, bedside commode, etc,.)?: Unable Help needed moving to and from a bed to chair (including a wheelchair)?: A Lot Help needed walking in hospital room?: Total Help needed climbing 3-5 steps with a railing? : Total 6 Click Score: 7    End of Session Equipment Utilized During Treatment: Gait belt Activity Tolerance: Patient tolerated treatment well Patient left: in chair;with call bell/phone within reach;with chair alarm set;with family/visitor present Nurse Communication: Mobility status;Precautions PT Visit Diagnosis: Muscle weakness (generalized) (M62.81);Difficulty in walking, not elsewhere classified (R26.2);Other abnormalities of gait and mobility (R26.89)     Time: 3291-9166 PT Time Calculation (min) (ACUTE ONLY): 30 min  Charges:  $Therapeutic Exercise: 8-22 mins $Therapeutic Activity:  8-22 mins                    G CodesHendricks Limes, PT 03/13/17, 10:30 AM [phone]

## 2017-03-13 NOTE — Clinical Social Work Note (Addendum)
Case discussed with case manager patient will be transferring to [facility] hospital tomorrow per Dr. Graciela Husbands.  CSW to sign off please re-consult if social work needs arise.  Ervin Knack. Kaycie Pegues, MSW, [employer] [phone]

## 2017-03-13 NOTE — Consult Note (Signed)
ELECTROPHYSIOLOGY CONSULT NOTE  Patient ID: Miranda [name], MRN: [id number], DOB/AGE: Nov 18, 1960 56 y.o. Admit date: 02/25/2017 Date of Consult: 03/13/2017  Primary Physician: Merwyn Katos, MD Primary Cardiologist: Patriciaann Lastrapes is a 56 y.o. female who is being seen today for the evaluation of cardiac arrest-aborted at the request of TG.   Chief Complaint: cardiac arrest   HPI Miranda [name] is a 56 y.o. female admitted with an aborted cardiac arrest 02/26/17.  She had an antent history of diarrhea and was taking 12 Imodium a day. She lost consciousness getting off the commode. She remained unresponsive. He began CPR and given a loss of pulse and agonal respiration. EMS was called and AED delivered a single shock.  Apparently EMS had to deliver additional external shocks evaluation included a chest CT that was negative for pulmonary embolism. She underwent catheterization demonstrating severe LV dysfunction but no significant coronary disease.  She required inotropic support. Identified hypokalemia was treated.repeat echocardiogram 5 days post admission demonstrated normalization of LV systolic function.  Prolonged monitoring had demonstrated PVCs  Old ECGs were reviewed.  DATE HR QRS duration  QT interval   8/21 22:38   118 406  8/21 23:20 114 126 470  8/22 04:56 81 122 470  8/23 14:36 66 98 400  9/05 08:56 78` 80          She has no prior history of syncope. There is no family history of syncope or premature cardiac death.   Past Medical History:  Diagnosis Date  . Dependent edema    bilateral legs  . Hot flashes   . HTN (hypertension)   . Migraines   . Nausea       Surgical History:  Past Surgical History:  Procedure Laterality Date  . APPENDECTOMY  1974  . CHOLECYSTECTOMY  1984  . LEFT HEART CATH AND CORONARY ANGIOGRAPHY N/A 02/26/2017   Procedure: LEFT HEART CATH AND CORONARY ANGIOGRAPHY;  Surgeon: Yvonne Kendall, MD;  Location: [facility] INVASIVE [facility];  Service:  Cardiovascular;  Laterality: N/A;  . TONSILLECTOMY  1964     Home Meds: Prior to Admission medications   Medication Sig Start Date End Date Taking? Authorizing Provider  acetaminophen (TYLENOL) 500 MG tablet Take 1,000 mg by mouth every 6 (six) hours as needed.   Yes [provider]  amLODipine (NORVASC) 10 MG tablet Take 10 mg by mouth daily.   Yes [provider]  hydrochlorothiazide (HYDRODIURIL) 25 MG tablet Take 1 tablet (25 mg total) by mouth daily. 08/16/16 02/26/18 Yes Gollan, Tollie Pizza, MD  ibuprofen (ADVIL,MOTRIN) 200 MG tablet Take 600 mg by mouth every 6 (six) hours as needed.   Yes [provider]  olmesartan (BENICAR) 40 MG tablet Take 1 tablet (40 mg total) by mouth daily. 01/16/17  Yes Gollan, Tollie Pizza, MD  ondansetron (ZOFRAN-ODT) 4 MG disintegrating tablet Take 4 mg by mouth every 8 (eight) hours as needed for nausea. 01/13/17  Yes [provider]  Olmesartan-Amlodipine-HCTZ 40-10-25 MG TABS Take 1 tablet by mouth daily. Patient not taking: Reported on 02/26/2017 08/20/16   Antonieta Iba, MD  potassium chloride (K-DUR) 10 MEQ tablet Take 1 tablet (10 mEq total) by mouth daily as needed. Patient not taking: Reported on 02/26/2017 08/20/16 11/18/16  Antonieta Iba, MD    Inpatient Medications:  . enoxaparin (LOVENOX) injection  40 mg Subcutaneous Q24H  . insulin aspart  0-20 Units Subcutaneous Q4H  . ipratropium-albuterol  3 mL Nebulization Q6H  .  potassium chloride  40 mEq Oral BID  . QUEtiapine  50 mg Per Tube QHS  . sodium chloride flush  10-40 mL Intracatheter Q12H    Allergies:  Allergies  Allergen Reactions  . Ace Inhibitors   . Compazine [Prochlorperazine Edisylate]   . Lisinopril   . Penicillins     Has patient had a PCN reaction causing immediate rash, facial/tongue/throat swelling, SOB or lightheadedness with hypotension: Unknown Has patient had a PCN reaction causing severe rash involving mucus membranes or skin necrosis:  Unknown Has patient had a PCN reaction that required hospitalization: Unknown Has patient had a PCN reaction occurring within the last 10 years: Unknown If all of the above answers are "NO", then may proceed with Cephalosporin use.     Social History   Social History  . Marital status: Married    Spouse name: N/A  . Number of children: N/A  . Years of education: N/A   Occupational History  . Not on file.   Social History Main Topics  . Smoking status: Never Smoker  . Smokeless tobacco: Never Used  . Alcohol use No  . Drug use: No  . Sexual activity: Not on file   Other Topics Concern  . Not on file   Social History Narrative  . No narrative on file     Family History  Problem Relation Age of Onset  . Lymphoma Mother   . Heart disease Father   . Stroke Father   . Hyperlipidemia Father   . Hypertension Father   . Hypertension Sister   . Migraines Sister   . Hypertension Brother      ROS:  Please see the history of present illness.     All other systems reviewed and negative.    Physical Exam: Blood pressure (!) 143/63, pulse 71, temperature 98.8 F (37.1 C), temperature source Oral, resp. rate 18, height 5\' 2"  (1.575 m), weight 184 lb 4.9 oz (83.6 kg), SpO2 98 %. General: Well developed, well nourished female in no acute distress. Head: Normocephalic, atraumatic, sclera non-icteric, no xanthomas, nares are without discharge. EENT: normal Lymph Nodes:  none Back: without scoliosis/kyphosis , no CVA tendersness Neck: Negative for carotid bruits. JVD not elevated. Lungs: Clear bilaterally to auscultation without wheezes, rales, or rhonchi. Breathing is unlabored. Heart: RRR with S1 S2. 2/6 systolic murmur , rubs, or gallops appreciated. Abdomen: Soft, non-tender, non-distended with normoactive bowel sounds. No hepatomegaly. No rebound/guarding. No obvious abdominal masses. Msk:  Strength and tone appear normal for age. Extremities: No clubbing or cyanosis. No   edema.  Distal pedal pulses are 2+ and equal bilaterally. Skin: Warm and Dry Neuro: Alert and oriented X 3 Voice hoarse. CN III-XII intact Grossly normal sensory and motor function . Psych:  Responds to questions appropriately with a normal affect. Remembers that she had a cardiac arrest      Labs: Cardiac Enzymes No results for input(s): CKTOTAL, CKMB, TROPONINI in the last 72 hours. CBC Lab Results  Component Value Date   WBC 9.5 03/10/2017   HGB 9.9 (L) 03/10/2017   HCT 29.2 (L) 03/10/2017   MCV 91.6 03/10/2017   PLT 317 03/10/2017   PROTIME: No results for input(s): LABPROT, INR in the last 72 hours. Chemistry  Recent Labs Lab 03/13/17 0416  NA 145  K 4.1  CL 112*  CO2 25  BUN 23*  CREATININE 0.45  CALCIUM 8.5*  GLUCOSE 106*   Lipids Lab Results  Component Value Date   TRIG  119 03/10/2017   BNP No results found for: PROBNP Thyroid Function Tests: No results for input(s): TSH, T4TOTAL, T3FREE, THYROIDAB in the last 72 hours.  Invalid input(s): FREET3    Miscellaneous No results found for: DDIMER  Radiology/Studies:  Dg Chest 1 View  Result Date: 02/26/2017 CLINICAL DATA:  Central line placement EXAM: CHEST 1 VIEW COMPARISON:  02/25/2017 CXR and CT FINDINGS: New left IJ approach central line catheter tip is seen at the cavoatrial junction. No pneumothorax. There is cardiomegaly without aortic aneurysm. Diffuse airspace opacities consistent with pulmonary edema is noted. Superimposed pneumonia would be difficult to entirely exclude. Satisfactory endotracheal and gastric tube positions with endotracheal tube tip 5.3 cm above the carina and gastric tube extending into the expected location the stomach. Cholecystectomy clips are seen in the right upper quadrant. IMPRESSION: 1. Satisfactory support line and tube positions. 2. New left IJ approach central venous line is noted with tip at the cavoatrial junction. No pneumothorax. 3. Cardiomegaly with diffuse bilateral  airspace opacities suspicious for pulmonary edema. Superimposed pneumonia would be difficult to entirely exclude. Electronically Signed   By: Tollie Eth M.D.   On: 02/26/2017 03:28   Dg Abd 1 View  Result Date: 03/07/2017 CLINICAL DATA:  NG tube placement. EXAM: ABDOMEN - 1 VIEW COMPARISON:  March 01, 2017. FINDINGS: Interval removal of previously seen nasogastric tube. Minimally prominent air-filled loops of small bowel. Cholecystectomy. IMPRESSION: Interval removal of enteric tube. Minimally prominent air-filled loops of small bowel may reflect ileus. Electronically Signed   By: Obie Dredge M.D.   On: 03/07/2017 12:07   Dg Abd 1 View  Result Date: 03/01/2017 CLINICAL DATA:  NG tube placement EXAM: ABDOMEN - 1 VIEW COMPARISON:  02/26/2017 FINDINGS: Enteric tube terminates in the distal gastric body. No evidence of small bowel obstruction. Mild gaseous distention of a loop of colon. IMPRESSION: Enteric tube terminates in the distal gastric body. Electronically Signed   By: Charline Bills M.D.   On: 03/01/2017 15:02   Ct Head Wo Contrast  Result Date: 02/25/2017 CLINICAL DATA:  Focal neuro deficit, greater than 6 hours. Stroke suspected. Unresponsive, post CPR. EXAM: CT HEAD WITHOUT CONTRAST TECHNIQUE: Contiguous axial images were obtained from the base of the skull through the vertex without intravenous contrast. COMPARISON:  None. FINDINGS: Brain: No evidence of acute infarction, hemorrhage, hydrocephalus, extra-axial collection or mass lesion/mass effect. Gray-white differentiation is preserved, no cerebral edema. Vascular: Atherosclerosis of skullbase vasculature without hyperdense vessel or abnormal calcification. Skull: Normal. Negative for fracture or focal lesion. Sinuses/Orbits: Paranasal sinuses and mastoid air cells are clear. The visualized orbits are unremarkable. Other: None. IMPRESSION: No acute intracranial abnormality. Electronically Signed   By: Rubye Oaks M.D.   On:  02/25/2017 23:18   Ct Chest Wo Contrast  Result Date: 03/10/2017 CLINICAL DATA:  Acute respiratory illness EXAM: CT CHEST WITHOUT CONTRAST TECHNIQUE: Multidetector CT imaging of the chest was performed following the standard protocol without IV contrast. COMPARISON:  Radiograph 03/10/2017, CT chest 02/25/2017 FINDINGS: Cardiovascular: Limited evaluation without intravenous contrast. Right upper extremity catheter tip positioned at the cavoatrial junction. Mild atherosclerotic calcification. Minimal coronary artery calcification. Borderline to mild cardiomegaly. Negative for large pericardial effusion. Mediastinum/Nodes: Stable small mediastinal lymph nodes. Largest is seen in the right paratracheal space and measures 9 mm. Endotracheal tube tip terminates just above the carina. Esophageal tube tip visualized to the mid stomach. Lungs/Pleura: Negative for pneumothorax or significant pleural effusion. Scattered bilateral ground-glass densities including a more focused area of consolidation  in the peripheral right upper lobe, corresponding to the radiographic abnormality overall the lung aeration has improved since prior CT from 02/25/2017. Upper Abdomen: Surgical clips in the gallbladder fossa. No acute abnormality. Musculoskeletal: Motion artifact causes false appearance of lower and upper sternal fracture. Acute right fifth anterior rib fracture. IMPRESSION: 1. Mild to moderate scattered bilateral ground-glass densities, favored to represent multifocal pneumonia, overall the aeration of the lung fields is improved compared to the prior chest CT. 2. Endotracheal tube tip is just above the carina 3. Acute to subacute right fifth anterior rib fracture Aortic Atherosclerosis (ICD10-I70.0). Electronically Signed   By: Jasmine Pang M.D.   On: 03/10/2017 14:21   Mr Brain Wo Contrast  Result Date: 03/04/2017 CLINICAL DATA:  Encephalopathy. Recent diarrhea and cardiac arrest. Not following commands. EXAM: MRI HEAD  WITHOUT CONTRAST TECHNIQUE: Multiplanar, multiecho pulse sequences of the brain and surrounding structures were obtained without intravenous contrast. COMPARISON:  Head CT from 7 days ago FINDINGS: Brain: No acute infarction, hemorrhage, hydrocephalus, extra-axial collection or mass lesion. No evidence of brain swelling. Few FLAIR hyperintensities in the cerebral white matter, often attributed to chronic microvascular ischemia. No specific demyelinating pattern. Negative for brain atrophy. Vascular: Major flow voids are preserved. Skull and upper cervical spine: Negative for marrow lesion Sinuses/Orbits: Partial bilateral mastoid opacification in the setting of nasopharyngeal and oropharyngeal fluid levels in this intubated patient. IMPRESSION: No acute finding including evidence of anoxic injury. Electronically Signed   By: Marnee Spring M.D.   On: 03/04/2017 15:40   Dg Chest Port 1 View  Result Date: 03/10/2017 CLINICAL DATA:  Acute respiratory failure, ventilatory support EXAM: PORTABLE CHEST 1 VIEW COMPARISON:  03/09/2017 FINDINGS: Endotracheal tube is 3.4 cm above the carina. NG tube within the stomach, tip not visualized. Right PICC line tip mid SVC level. Stable cardiomegaly with vascular congestion and diffuse mixed airspace and interstitial opacities versus edema. No enlarging effusion or pneumothorax. Right peripheral mid lung nodular opacity remains indeterminate for superimposed shadows or lung nodule. Recommend attention on follow-up exams. IMPRESSION: Stable cardiomegaly with similar diffuse mixed interstitial and airspace opacities, suspicious for infection versus edema. No enlarging effusion or pneumothorax Possible right midlung peripheral lung nodule. Electronically Signed   By: Judie Petit.  Shick M.D.   On: 03/10/2017 09:29   Dg Chest Port 1 View  Result Date: 03/09/2017 CLINICAL DATA:  Respiratory failure.  Intubated. EXAM: PORTABLE CHEST 1 VIEW COMPARISON:  03/09/2015. FINDINGS: Endotracheal tube  in satisfactory position. Nasogastric tube extending into the stomach. Right PICC tip in the inferior aspect of the superior vena cava. Stable enlarged cardiac silhouette and bilateral interstitial opacities. Unremarkable bones. IMPRESSION: Stable cardiomegaly and interstitial lung disease. Electronically Signed   By: Beckie Salts M.D.   On: 03/09/2017 08:30   Dg Chest Port 1 View  Result Date: 03/08/2017 CLINICAL DATA:  Acute respiratory failure.  Intubated. EXAM: PORTABLE CHEST 1 VIEW COMPARISON:  Yesterday. FINDINGS: Endotracheal tube in satisfactory position. Stable enlarged cardiac silhouette. Stable diffuse prominence of the interstitial markings with no definite superimposed airspace opacity at this time. No pleural fluid. Nasogastric tube extending into the stomach. Unremarkable bones. IMPRESSION: Stable cardiomegaly and interstitial lung disease with no definite superimposed alveolar edema or pneumonia at this time. Electronically Signed   By: Beckie Salts M.D.   On: 03/08/2017 18:01   Dg Chest Port 1 View  Result Date: 03/07/2017 CLINICAL DATA:  Evaluate for a nasogastric tube. EXAM: PORTABLE CHEST 1 VIEW COMPARISON:  03/07/2017 FINDINGS: Nasogastric tube  has been pulled back and it is now in the upper chest near the thoracic inlet. Endotracheal tube is roughly 4.1 cm above the carina. There continues to be enlarged interstitial and vascular markings in both lungs. Cardiac silhouette is upper limits of normal and stable. Central line tip in the SVC region. Negative for a pneumothorax. IMPRESSION: Nasogastric tube has been pulled back and located in the upper chest as described. Persistent patchy lung densities suggestive for pulmonary edema and/or airspace disease. Support apparatuses as described. Electronically Signed   By: Richarda Overlie M.D.   On: 03/07/2017 12:11   Dg Chest Port 1 View  Result Date: 03/07/2017 CLINICAL DATA:  Acute respiratory failure . EXAM: PORTABLE CHEST 1 VIEW COMPARISON:   03/06/2017 . FINDINGS: Endotracheal tube, NG tube, right PICC line stable position. Stable cardiomegaly. Diffuse bilateral pulmonary interstitial prominence consistent CHF again noted. Slight interval clearing. Small left pleural effusion. No pneumothorax . IMPRESSION: 1. Lines and tubes in stable position. 2. Cardiomegaly with diffuse bilateral from interstitial prominence consistent CHF again noted. Slight interval clearing. Small left pleural effusion. Electronically Signed   By: Maisie Fus  Register   On: 03/07/2017 06:17   Dg Chest Port 1 View  Result Date: 03/06/2017 CLINICAL DATA:  Respiratory failure EXAM: PORTABLE CHEST 1 VIEW COMPARISON:  03/05/2017 FINDINGS: Endotracheal tube and NG tube as well as right PICC line remain in place, unchanged. Diffuse bilateral airspace disease, slightly worsened since prior study. Cardiomegaly. IMPRESSION: Worsening diffuse bilateral airspace disease, likely edema/ CHF. Electronically Signed   By: Charlett Nose M.D.   On: 03/06/2017 07:11   Dg Chest Port 1 View  Result Date: 03/05/2017 CLINICAL DATA:  Post cardiac arrest.  Respiratory failure. EXAM: PORTABLE CHEST 1 VIEW COMPARISON:  03/04/2017 FINDINGS: Endotracheal tube terminates 11 mm above the carina. Retraction by approximately 2 cm is recommended. Enteric catheter tip is collimated off the image. Right PICC line in stable position with tip overlying the expected position of superior vena cava. Mildly enlarged cardiomediastinal silhouette. No evidence of pneumothorax. Low lung volumes with mixed pattern pulmonary edema. Osseous structures are without acute abnormality. Soft tissues are grossly normal. IMPRESSION: Endotracheal tube terminates 11 mm above the carina. Retraction by approximately 2 cm is recommended. Mixed pattern pulmonary edema. Borderline enlarged cardiomediastinal silhouette. This may be due to portable AP technique, however vascular abnormality cannot be excluded. Please correlate clinically.  These results will be called to the ordering clinician or representative by the Radiologist Assistant, and communication documented in the PACS or zVision Dashboard. Electronically Signed   By: Ted Mcalpine M.D.   On: 03/05/2017 12:37   Dg Chest Port 1 View  Result Date: 03/04/2017 CLINICAL DATA:  Respiratory failure. EXAM: PORTABLE CHEST 1 VIEW COMPARISON:  03/03/2017. FINDINGS: Endotracheal tube, NG tube, right PICC line in stable position. Heart size normal. Diffuse bilateral pulmonary infiltrates/edema again noted. Slight improvement from prior exam. Small left pleural effusion again noted. No pneumothorax. IMPRESSION: 1. Lines and tubes stable position. 2. Diffuse bilateral pulmonary infiltrates/edema again noted. Slight improvement from prior exam. Small left pleural effusion again noted. Electronically Signed   By: Maisie Fus  Register   On: 03/04/2017 06:42   Dg Chest Port 1 View  Result Date: 03/03/2017 CLINICAL DATA:  Respiratory failure. EXAM: PORTABLE CHEST 1 VIEW COMPARISON:  03/02/2017. FINDINGS: Endotracheal tube, NG tube, right PICC line stable position. Heart size stable. Diffuse bilateral pulmonary infiltrates. Small left pleural effusion. No pneumothorax . IMPRESSION: 1.  Lines and tubes in stable position.  2. Diffuse bilateral pulmonary infiltrates/ edema. Small left pleural effusion. No interim change. Electronically Signed   By: Maisie Fus  Register   On: 03/03/2017 06:37   Dg Chest Port 1 View  Result Date: 03/02/2017 CLINICAL DATA:  Acute respiratory failure. On ventilator. Cardiac arrest with ventricular fibrillation. EXAM: PORTABLE CHEST 1 VIEW COMPARISON:  03/01/2017 FINDINGS: Endotracheal tube remains in appropriate position. New nasogastric tube is seen entering the stomach. New right arm PICC line is seen with tip in the region of the cavoatrial junction. Stable mild cardiomegaly. Previously seen pneumomediastinum no longer visualized. No evidence of pneumothorax. Mild  diffuse bilateral pulmonary airspace disease shows no significant interval change. IMPRESSION: Stable cardiomegaly and diffuse bilateral airspace disease/ edema. Interval resolution of pneumomediastinum since previous study. No evidence of pneumothorax. Electronically Signed   By: Myles Rosenthal M.D.   On: 03/02/2017 09:38   Dg Chest Port 1 View  Addendum Date: 03/01/2017   ADDENDUM REPORT: 03/01/2017 06:41 ADDENDUM: Study discussed by telephone with NP Dinzy on 03/01/2017 at 0635 hours. She advised the patient had been extubated and was re-intubated at 0430 hours today, and that the re-intubation was traumatic. Therefore, favor that trauma as the origin of the pneumomediastinum and soft tissue gas. I advised that if this gas accumulations seem to be progressing on clinical exam or subsequent radiographs then further evaluation of the extent of trauma would be recommended, such as with Chest CT (IV contrast preferred). Electronically Signed   By: Odessa Fleming M.D.   On: 03/01/2017 06:41   Result Date: 03/01/2017 CLINICAL DATA:  56 year old female cardiac arrest with V fib. Intubated. EXAM: PORTABLE CHEST 1 VIEW COMPARISON:  02/28/2017 and earlier. FINDINGS: Portable AP semi upright view at 0542 hours. Endotracheal tube tip projects about 10 mm above the carina. Left IJ central line appears stable. Enteric tube has been removed. New pneumomediastinum and supraclavicular subcutaneous gas. There is no pneumothorax identified. There is moderate gaseous distension of the visible stomach. Increasing left lower lobe collapse or consolidation. Continued bilateral hazy pulmonary opacity. No pleural effusion identified. Stable cardiac size and mediastinal contours. IMPRESSION: 1. New pneumomediastinum and supraclavicular subcutaneous emphysema. Etiology is unclear, with no pneumothorax identified. 2. Enteric tube removed. ET tube tip 10 mm above the carina and stable left IJ central line. 3. New left lower lobe collapse or  consolidation. Continued diffuse hazy pulmonary opacity which might reflect pulmonary edema or ARDS. 4. Interval gaseous distension of the stomach. Electronically Signed: By: Odessa Fleming M.D. On: 03/01/2017 06:19   Dg Chest Port 1 View  Result Date: 02/28/2017 CLINICAL DATA:  Respiratory failure. EXAM: PORTABLE CHEST 1 VIEW COMPARISON:  09/2016 FINDINGS: Endotracheal tube, left IJ line, NG tube in stable position. Heart size stable. Persistent bilateral airspace disease again noted without significant change. No prominent pleural effusion. No pneumothorax. IMPRESSION: 1. Lines and tubes in stable position. 2. Persistent bilateral diffuse airspace disease without significant change. Electronically Signed   By: Maisie Fus  Register   On: 02/28/2017 06:21   Dg Chest Port 1 View  Result Date: 02/27/2017 CLINICAL DATA:  Respiratory failure. EXAM: PORTABLE CHEST 1 VIEW COMPARISON:  02/26/2017. FINDINGS: Endotracheal tube, left IJ line, NG tube in stable position. Heart size stable. Diffuse bilateral airspace disease again noted. Slight interim improvement. No prominent pleural effusion or pneumothorax. IMPRESSION: 1. Lines and tubes in stable position. 2. Diffuse bilateral airspace disease again noted. Slight interim improvement. Electronically Signed   By: Maisie Fus  Register   On: 02/27/2017 06:23  Dg Chest Portable 1 View  Result Date: 02/25/2017 CLINICAL DATA:  Status post intubation. EXAM: PORTABLE CHEST 1 VIEW COMPARISON:  None. FINDINGS: The heart size and mediastinal contours are within normal limits. Endotracheal tube is seen projected over tracheal air shadow with distal tip 4 cm above the carina. Nasogastric tube is seen looped within proximal stomach. Bilateral lung opacities are noted concerning for edema or possibly inflammation. No definite pneumothorax or pleural effusion is noted. The visualized skeletal structures are unremarkable. IMPRESSION: Endotracheal and nasogastric tubes in grossly good  position. Mild bilateral lung opacities are noted concerning for edema or possibly inflammation. Electronically Signed   By: Lupita Raider, M.D.   On: 02/25/2017 23:01   Dg Abd Portable 1v  Result Date: 03/07/2017 CLINICAL DATA:  Repositioning of NG tube. EXAM: PORTABLE ABDOMEN - 1 VIEW COMPARISON:  One-view abdomen 03/07/2017 FINDINGS: The side port of the NG tube is now in the antrum of the stomach. By gas pattern is normal. Bibasilar airspace disease remains, left greater than right. Left pleural effusion is present. IMPRESSION: 1. NG tube in place.  The side port is in the antrum of the stomach. 2. Normal bowel gas pattern. 3. Bibasilar airspace disease/lobar pneumonia. Electronically Signed   By: Marin Roberts M.D.   On: 03/07/2017 13:09   Dg Abd Portable 1v  Result Date: 02/26/2017 CLINICAL DATA:  56 y/o  F; encounter for feeding tube placement. EXAM: PORTABLE ABDOMEN - 1 VIEW COMPARISON:  None. FINDINGS: Right hemiabdomen is excluded from field of view. Normal bowel gas pattern. Enteric tube tip projects over the distal stomach. Cholecystectomy clips. Bones are unremarkable. IMPRESSION: Enteric tube tip projects over distal stomach. Electronically Signed   By: Mitzi Hansen M.D.   On: 02/26/2017 14:39   Ct Angio Chest Aorta W And/or Wo Contrast  Result Date: 02/25/2017 CLINICAL DATA:  Unwitnessed fall, unresponsive. EXAM: CT ANGIOGRAPHY CHEST WITH CONTRAST TECHNIQUE: Multidetector CT imaging of the chest was performed using the standard protocol during bolus administration of intravenous contrast. Multiplanar CT image reconstructions and MIPs were obtained to evaluate the vascular anatomy. CONTRAST:  100 mL of Isovue 370 intravenously. COMPARISON:  Radiograph of same day. FINDINGS: Cardiovascular: Suboptimal opacification of pulmonary arteries is noted due to timing of contrast bolus as well as respiratory motion artifact. Large central pulmonary embolus is not identified in  the main pulmonary artery or the main portions of the right or left pulmonary emboli. However, smaller peripheral pulmonary emboli cannot be excluded on the basis of this exam. There is no evidence of thoracic aortic dissection or aneurysm. No pericardial effusion is noted. Mediastinum/Nodes: Endotracheal tube is in grossly good position. No mediastinal adenopathy is noted. Thyroid gland is unremarkable. Lungs/Pleura: No pneumothorax or pleural effusion is noted. Bilateral diffuse lung opacities are noted concerning for edema or pneumonia. Upper Abdomen: Nasogastric tube tip is seen in distal stomach. Musculoskeletal: No chest wall abnormality. No acute or significant osseous findings. Review of the MIP images confirms the above findings. IMPRESSION: Suboptimal opacification of pulmonary arteries is noted due to timing of contrast bolus as well as respiratory motion artifact. Large central pulmonary embolus is not identified, but smaller peripheral emboli cannot be excluded on the basis of this exam. Bilateral lung opacities are noted concerning for edema or pneumonia. Endotracheal and nasogastric tubes appear to be in good position. Electronically Signed   By: Lupita Raider, M.D.   On: 02/25/2017 23:35    EKG:  As above   Assessment  and Plan:  Cardiac Arrest  Imodium exposure  The patient had a cardiac arrest with documented ventricular fibrillation. This occurred contextually with the use of Imodium. There has been an association of Imodium with cardiac arrest with electrocardiographic abnormalities including QT prolongation and QRS widening. The literature suggests that high dose and long-term loperamide is been associated with these arrests in the past with daily and takes ranging from "64--1600 mg, often continuously for weeks or months "this patient's intake was 12 tablets which is far below what has been reported as life-threatening. In addition, the absence of ECG abnormalities makes it harder  to implicate the loperamide as the culprit. 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 I have reviewed this with the patient. We will arrange transfer to [facility] with implantation next week.  She'll not be allowed to drive for 6 months Sherryl Manges

## 2017-03-13 NOTE — Progress Notes (Signed)
[facility] - [place] at [facility]   PATIENT NAME: Miranda [name]    MR#:  [id number]  DATE OF BIRTH:  [birth year]  SUBJECTIVE:   Intubated,sedated and on the vent .  tapered sedation,  More calm and follows simple commands   Finally extubated today, talking in low voice, have cough and diarrhea, but very happy.  REVIEW OF SYSTEMS:   Review of Systems  Constitutional: Negative for chills, fever and weight loss.  HENT: Negative for congestion, ear pain, hearing loss and sore throat.   Eyes: Negative for blurred vision, double vision and pain.  Respiratory: Negative for cough, sputum production and shortness of breath.   Cardiovascular: Negative for chest pain, orthopnea and leg swelling.  Gastrointestinal: Negative for constipation, nausea and vomiting.  Genitourinary: Negative for flank pain, frequency and urgency.  Musculoskeletal: Negative for joint pain and neck pain.  Skin: Negative for rash.  Neurological: Negative for dizziness, tingling, tremors, sensory change and focal weakness.  Psychiatric/Behavioral: Negative for depression, memory loss and substance abuse. The patient does not have insomnia.    DRUG ALLERGIES:   Allergies  Allergen Reactions  . Ace Inhibitors   . Compazine [Prochlorperazine Edisylate]   . Lisinopril   . Penicillins     Has patient had a PCN reaction causing immediate rash, facial/tongue/throat swelling, SOB or lightheadedness with hypotension: Unknown Has patient had a PCN reaction causing severe rash involving mucus membranes or skin necrosis: Unknown Has patient had a PCN reaction that required hospitalization: Unknown Has patient had a PCN reaction occurring within the last 10 years: Unknown If all of the above answers are "NO", then may proceed with Cephalosporin use.     VITALS:  Blood pressure (!) 143/63, pulse 71, temperature 98.8 F (37.1 C), temperature source Oral, resp. rate 18, height  (1.575 m), weight  83.6 kg (184 lb 4.9 oz), SpO2 98 %.  PHYSICAL EXAMINATION:   Physical Exam  GENERAL:  56 y.o.-year-old patient lying in the bed with no acute distress.  EYES: Pupils equal, round, reactive to light . No scleral icterus. Extraocular muscles intact.  HEENT: Head atraumatic, normocephalic. Oropharynx and nasopharynx clear. Intubated NECK:  Supple, no jugular venous distention. No thyroid enlargement, no tenderness, NG tube.  LUNGS: Normal breath sounds bilaterally, no wheezing, rales, rhonchi. No use of accessory muscles of respiration.  CARDIOVASCULAR: S1, S2 normal. No murmurs, rubs, or gallops.  ABDOMEN: Soft, nontender, nondistended. Bowel sounds feeble. No organomegaly or mass.  EXTREMITIES: No cyanosis, clubbing or edema b/l.    NEUROLOGIC: Moves limbs , follow commands. Sitting in a chair, power 4/5 all limbs, PSYCHIATRIC:   Extubated now,  calm and follows simple commands. SKIN: No obvious rash, lesion, or ulcer.   LABORATORY PANEL:  CBC  Recent Labs Lab 03/10/17 0530  WBC 9.5  HGB 9.9*  HCT 29.2*  PLT 317    Chemistries   Recent Labs Lab 03/13/17 0416  NA 145  K 4.1  CL 112*  CO2 25  GLUCOSE 106*  BUN 23*  CREATININE 0.45  CALCIUM 8.5*  MG 2.0   Cardiac Enzymes No results for input(s): TROPONINI in the last 168 hours. RADIOLOGY:  No results found. ASSESSMENT AND PLAN:   Ms. Bourgoin reportedly had diarrhea over the last 2-3 days for which she was taking up to 12 Imodium per day. She was trying to stay well hydrated and had also stopped taking her blood pressure medications on account of her GI illness. Around 9  PM, Ms. Weberg's husband found the patient seated on the toilet. She attempted to stand up but passed out.  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 Severely reduced LVEF less than <20%  -repeat echo showed EF of  60-65% -Patient was on IV levophed,Milrinone- improved now. - Was IV propofol -Patient was  on hypothermia protocol -CT chest negative for PE -CXR shows pulmonary edema. Received Lasix - Xray shows still multilobar pneumonia, CT chest done. - Extubated , no distress. - EP physician to see the pt per cardiologist.  *Acute renal failure secondary to #1 -making good urine -Creatinine stable  *Febrile illness- multilobar pneumonia -Low-grade fever, tachycardia elevated white count of 40,000--- 19K -Blood cultures so far negative -Empiric IV vancomycin and Zosyn- changed to rocephin -C. Difficile negative -chest x-ray shows diffuse pulmonary infiltrate - Had bronch done 03/07/17- 10 ml cloudy thick secretion suctioned from right Middle lobe. Sent for culture.- negative so far.  - Sputum cx on 8/24 have Pansensitive staph.   Abx given until 03/07/17  *altered mental status -Patient currently has periods of agitation  -Seen by Dr. Thad Ranger -- neurology -Recommend EEG and brain MRI. - No acute injuries. Taper sedation slowly. - likely due to sedative meds. - more calm and following commands , extubated  Now. - Due to prolonged hospitalization and weakness now, may need rehab placement.  *Metabolic acidosis secondary to #1  *Hyperglycemia -Patient currently off insulin drip  * Nutrition - swallow eval.  *DVT prophylaxis subcutaneous heparin  Now extubated, may need rehab.  Case discussed with Care Management/Social Worker. Management plans discussed with the  family and they are in agreement.  CODE STATUS: Full  DVT Prophylaxis: Subcutaneous heparin  TOTAL CRITICAL TIME TAKING CARE OF THIS PATIENT: 30 minutes.  >50% time spent on counselling and coordination of care Spoke to her daughter in room today.  Note: This dictation was prepared with Dragon dictation along with smaller phrase technology. Any transcriptional errors that result from this process are  unintentional.  Altamese Dilling M.D on 03/13/2017 at 10:05 AM  Between 7am to 6pm - Pager - [phone]  After 6pm go to [email or web address] - Social research officer, government  [facility]  Office  [phone]  CC: Primary care physician; Merwyn Katos, MD

## 2017-03-13 NOTE — Progress Notes (Signed)
Occupational Therapy Treatment Patient Details Name: Miranda [name] MRN: [id number] DOB: 1960/10/04 Today's Date: 03/13/2017    History of present illness 56 yo female pt presented to ER after unresponsive episode/cardiac arrest in home; admitted for hospitalization secondary to cardiac arrest with ventricular fibrillation (requiring shock x3 prior to ROSC), status post hypothermia protocol (initiated 8/21, rewarming initiated 8/25). Underwent emergent cardiac cath upon arrival to facility, with no significant disease noted, but with significantly reduced EF (15%).  Failed initial extubation 8/25 requiring reintubation within minutes but successfully extubated AM 03/11/17.  MRI negative for acute insult, but some discussion of possible anoxic brain injury noted in chart.    OT comments  Pt seen today for OT treatment session. Pt in pleasant mood, motivated to participate, and excited to be hopefully going to CIR tomorrow. Pt required supervision to perform bed mobility this date, able to tolerate sitting EOB for grooming tasks for 18 minutes with no LOB and no SOB. Some decreased attention to detail and mild problem solving delays when attempting to open lotion bottle but with additional time and 1 verbal cue, able to do without help. Pt continues to benefit from skilled OT services and CIR recommendation still appropriate.    Follow Up Recommendations  CIR    Equipment Recommendations  3 in 1 bedside commode    Recommendations for Other Services      Precautions / Restrictions Precautions Precautions: Fall Precaution Comments: R PICC Restrictions Weight Bearing Restrictions: No       Mobility Bed Mobility Overal bed mobility: Needs Assistance Bed Mobility: Supine to Sit;Sit to Supine     Supine to sit: Supervision;HOB elevated Sit to supine: Supervision   General bed mobility comments: additional time/effort to perform but no physical assist, HOB elevated, bed rails  utilized.  Transfers                      Balance Overall balance assessment: Needs assistance Sitting-balance support: No upper extremity supported;Feet unsupported Sitting balance-Leahy Scale: Good                                     ADL either performed or assessed with clinical judgement   ADL Overall ADL's : Needs assistance/impaired     Grooming: Sitting;Supervision/safety;Set up;Wash/dry hands;Wash/dry face;Oral care;Applying deodorant Grooming Details (indicate cue type and reason): Pt performed grooming tasks with set up and supervision while seated EOB for approx 18 minutes with no LOB, no SOB. Subtle delay in problem solving as she was attempting to get hand lotion out of the pump bottle, requiring verbal cue to hold the bottle in one hand and remove pump lid to get lotion since the bottle was low. Min assist to open fingernail polish bottle as it was too tight for pt after several attempts. Slightly impaired attention to detail during tasks but overall able to perform without assist.                                     Vision Baseline Vision/History: No visual deficits Patient Visual Report: No change from baseline Vision Assessment?: No apparent visual deficits   Perception     Praxis      Cognition Arousal/Alertness: Awake/alert Behavior During Therapy: Impulsive Overall Cognitive Status: Within Functional Limits for tasks assessed  General Comments: Hoarse whisper/mouths words        Exercises     Shoulder Instructions       General Comments      Pertinent Vitals/ Pain       Pain Assessment: No/denies pain  Home Living                                          Prior Functioning/Environment              Frequency  Min 3X/week        Progress Toward Goals  OT Goals(current goals can now be found in the care plan section)  Progress  towards OT goals: Progressing toward goals  Acute Rehab OT Goals Patient Stated Goal: to get stronger and more independent OT Goal Formulation: With patient/family Time For Goal Achievement: 03/24/17 Potential to Achieve Goals: Good  Plan Discharge plan remains appropriate;Frequency remains appropriate    Co-evaluation                 AM-PAC PT "6 Clicks" Daily Activity     Outcome Measure   Help from another person eating meals?: None Help from another person taking care of personal grooming?: A Little Help from another person toileting, which includes using toliet, bedpan, or urinal?: A Lot Help from another person bathing (including washing, rinsing, drying)?: A Lot Help from another person to put on and taking off regular upper body clothing?: None Help from another person to put on and taking off regular lower body clothing?: A Lot 6 Click Score: 17    End of Session    OT Visit Diagnosis: Other abnormalities of gait and mobility (R26.89);Muscle weakness (generalized) (M62.81)   Activity Tolerance Patient tolerated treatment well   Patient Left in bed;with call bell/phone within reach;with bed alarm set   Nurse Communication          Time: 6213-0865 OT Time Calculation (min): 29 min  Charges: OT General Charges $OT Visit: 1 Visit OT Treatments $Self Care/Home Management : 23-37 mins  Richrd Prime, MPH, MS, OTR/L ascom [phone] 03/13/17, 3:08 PM

## 2017-03-13 NOTE — Progress Notes (Addendum)
  Speech Language Pathology Treatment: Dysphagia  Patient Details Name: Miranda [name] MRN: [id number] DOB: Jun 21, 1961 Today's Date: 03/13/2017 Time: 6861-6837 SLP Time Calculation (min) (ACUTE ONLY): 45 min  Assessment / Plan / Recommendation Clinical Impression  Pt was seen this morning for ongoing assessment of swallowing function. Pt has been NPO except for pleasure trials of purees(applesauce, yogurt) following general aspiration precautions. Pt and NSG deny any overt s/s of aspiration or difficulty swallowing. Pt has had lessened desire for oral intake d/t overall illness and diarrhea. Noted much less hacking, congested cough at rest(prior to po's) now post extubation.  Pt required verbal encouragement for full positioning upright for po trials. Pt then consumed po trials of thin liquids via cup following precautions of small, single sip. No immediate, overt s/s of aspiration noted w/ trials - vocal quality clear w/ no decline in respiratory status w/ the trials. As trials continued, no coughing was noted, however, a mild throat clear/cough occurred x1-2 toward end of all trials. Unsure if directly related to the trials of thin liquids, then solids alternated. ST services will continue to monitor and assess for need for objective assessment. Oral phase was wfl for all consistencies - adequate bolus management and clearing w/ all. Pt was able to feed self. Pt appeared easily fatigued (d/t extended illness) w/ the session overall; such fatigue can impact swallowing if it increases.  As pt appears to be improving medically, swallow function appears to be improving. Recommend initiation of a regular diet (GI recs) w/ thin liquids - NO Straws. Recommend aspiration precautions; Pills in Puree for safer swallowing at this time. NSG/MD updated.  Addendum: as pt's medical status stabilizes(potential transfer to [facility] d/t Cardiac issues/procedure), would recommend a formal Cognitive-linguistic assessment and f/u  as needed d/t some inconsistencies noted in Cognitive behaviors.   HPI  see full chart notes      SLP Plan  Continue with current plan of care       Recommendations  Diet recommendations: Regular;Thin liquid (GI recommendations as well) Liquids provided via: Cup;No straw Medication Administration: Whole meds with puree Supervision: Patient able to self feed;Intermittent supervision to cue for compensatory strategies (for follow through w/ aspiration precautions; positioning) Compensations: Minimize environmental distractions;Slow rate;Small sips/bites;Multiple dry swallows after each bite/sip;Follow solids with liquid (throat clear/cough post meals to ensure clearing) Postural Changes and/or Swallow Maneuvers: Seated upright 90 degrees;Upright 30-60 min after meal                General recommendations:  (Dietician f/u) Oral Care Recommendations: Oral care BID;Patient independent with oral care;Staff/trained caregiver to provide oral care Follow up Recommendations:  (TBD) SLP Visit Diagnosis: Dysphagia, pharyngeal phase (R13.13) Plan: Continue with current plan of care       GO                Jerilynn Som, MS, CCC-SLP Treysean Petruzzi 03/13/2017, 10:53 AM

## 2017-03-13 NOTE — Care Management (Addendum)
Notified Fae Pippin that patient has transferred out of icu and have reaching out to therapies for updated PT and OT treatment notes for today.

## 2017-03-13 NOTE — Progress Notes (Signed)
DR Mariah Milling and DR Elisabeth Pigeon was mad aware of pt HR dropping to the 40's and staying the 50's , no new order at this time , will continue to monitor

## 2017-03-13 NOTE — Care Management (Signed)
Cardiology note says "will arrange for transfer to [facility] with implantation next week".  CM spoke with Dr Graciela Husbands to clarify his plan for  ICD implantation.  Dr Graciela Husbands states he intends for patient to be transferred to [facility] tomorrow and have ICD inserted on 9/10.  CM spoke with attending and informed him of this conversation. Updated primary nurse, Fae Pippin w [facility] and patient.

## 2017-03-14 ENCOUNTER — Inpatient Hospital Stay (HOSPITAL_COMMUNITY)
Admission: AD | Admit: 2017-03-14 | Discharge: 2017-03-20 | DRG: 227 | Disposition: A | Discharge status: Rehabilitation Facility | Payer: BC Managed Care – PPO | Source: Other Acute Inpatient Hospital | Attending: Cardiology | Admitting: Cardiology

## 2017-03-14 ENCOUNTER — Encounter: Payer: Self-pay | Admitting: Physician Assistant

## 2017-03-14 DIAGNOSIS — Z807 Family history of other malignant neoplasms of lymphoid, hematopoietic and related tissues: Secondary | ICD-10-CM | POA: Diagnosis not present

## 2017-03-14 DIAGNOSIS — G931 Anoxic brain damage, not elsewhere classified: Secondary | ICD-10-CM | POA: Diagnosis present

## 2017-03-14 DIAGNOSIS — Z959 Presence of cardiac and vascular implant and graft, unspecified: Secondary | ICD-10-CM

## 2017-03-14 DIAGNOSIS — J810 Acute pulmonary edema: Secondary | ICD-10-CM | POA: Diagnosis present

## 2017-03-14 DIAGNOSIS — E876 Hypokalemia: Secondary | ICD-10-CM | POA: Diagnosis present

## 2017-03-14 DIAGNOSIS — Z8249 Family history of ischemic heart disease and other diseases of the circulatory system: Secondary | ICD-10-CM | POA: Diagnosis not present

## 2017-03-14 DIAGNOSIS — R197 Diarrhea, unspecified: Secondary | ICD-10-CM

## 2017-03-14 DIAGNOSIS — I469 Cardiac arrest, cause unspecified: Secondary | ICD-10-CM | POA: Diagnosis not present

## 2017-03-14 DIAGNOSIS — I10 Essential (primary) hypertension: Secondary | ICD-10-CM | POA: Diagnosis present

## 2017-03-14 DIAGNOSIS — I462 Cardiac arrest due to underlying cardiac condition: Secondary | ICD-10-CM | POA: Diagnosis present

## 2017-03-14 DIAGNOSIS — J9600 Acute respiratory failure, unspecified whether with hypoxia or hypercapnia: Secondary | ICD-10-CM | POA: Diagnosis present

## 2017-03-14 DIAGNOSIS — Z9049 Acquired absence of other specified parts of digestive tract: Secondary | ICD-10-CM | POA: Diagnosis not present

## 2017-03-14 DIAGNOSIS — R531 Weakness: Secondary | ICD-10-CM | POA: Diagnosis not present

## 2017-03-14 DIAGNOSIS — E46 Unspecified protein-calorie malnutrition: Secondary | ICD-10-CM | POA: Diagnosis not present

## 2017-03-14 DIAGNOSIS — I5021 Acute systolic (congestive) heart failure: Secondary | ICD-10-CM | POA: Diagnosis not present

## 2017-03-14 DIAGNOSIS — R001 Bradycardia, unspecified: Secondary | ICD-10-CM | POA: Diagnosis present

## 2017-03-14 DIAGNOSIS — R570 Cardiogenic shock: Secondary | ICD-10-CM | POA: Diagnosis present

## 2017-03-14 DIAGNOSIS — N179 Acute kidney failure, unspecified: Secondary | ICD-10-CM

## 2017-03-14 DIAGNOSIS — Z888 Allergy status to other drugs, medicaments and biological substances status: Secondary | ICD-10-CM | POA: Diagnosis not present

## 2017-03-14 DIAGNOSIS — Z880 Allergy status to penicillin: Secondary | ICD-10-CM

## 2017-03-14 DIAGNOSIS — R5381 Other malaise: Secondary | ICD-10-CM

## 2017-03-14 DIAGNOSIS — I4901 Ventricular fibrillation: Principal | ICD-10-CM | POA: Diagnosis present

## 2017-03-14 DIAGNOSIS — D62 Acute posthemorrhagic anemia: Secondary | ICD-10-CM | POA: Diagnosis not present

## 2017-03-14 HISTORY — DX: Atherosclerotic heart disease of native coronary artery without angina pectoris: I25.10

## 2017-03-14 HISTORY — DX: Ventricular fibrillation: I49.01

## 2017-03-14 LAB — CBC
HCT: 30.3 % — ABNORMAL LOW (ref 35.0–47.0)
HEMOGLOBIN: 10.4 g/dL — AB (ref 12.0–16.0)
MCH: 31 pg (ref 26.0–34.0)
MCHC: 34.3 g/dL (ref 32.0–36.0)
MCV: 90.4 fL (ref 80.0–100.0)
Platelets: 326 10*3/uL (ref 150–440)
RBC: 3.35 MIL/uL — AB (ref 3.80–5.20)
RDW: 14 % (ref 11.5–14.5)
WBC: 7.5 10*3/uL (ref 3.6–11.0)

## 2017-03-14 LAB — BASIC METABOLIC PANEL
Anion gap: 6 (ref 5–15)
BUN: 25 mg/dL — AB (ref 6–20)
CHLORIDE: 111 mmol/L (ref 101–111)
CO2: 25 mmol/L (ref 22–32)
Calcium: 8.8 mg/dL — ABNORMAL LOW (ref 8.9–10.3)
Creatinine, Ser: 0.5 mg/dL (ref 0.44–1.00)
GFR calc Af Amer: >60 mL/min (ref >60)
GFR calc non Af Amer: >60 mL/min (ref >60)
GLUCOSE: 113 mg/dL — AB (ref 65–99)
POTASSIUM: 4.4 mmol/L (ref 3.5–5.1)
SODIUM: 142 mmol/L (ref 135–145)

## 2017-03-14 LAB — MAGNESIUM: Magnesium: 2.1 mg/dL (ref 1.7–2.4)

## 2017-03-14 LAB — GLUCOSE, CAPILLARY
GLUCOSE-CAPILLARY: 116 mg/dL — AB (ref 65–99)
GLUCOSE-CAPILLARY: 132 mg/dL — AB (ref 65–99)
Glucose-Capillary: 97 mg/dL (ref 65–99)
Glucose-Capillary: 104 mg/dL — ABNORMAL HIGH (ref 65–99)
Glucose-Capillary: 95 mg/dL (ref 65–99)
Glucose-Capillary: 96 mg/dL (ref 65–99)

## 2017-03-14 MED ORDER — CARVEDILOL 3.125 MG PO TABS: 3.1250 mg | ORAL_TABLET | Freq: Two times a day (BID) | ORAL | 0 refills | Status: DC

## 2017-03-14 MED ORDER — LOPERAMIDE HCL 2 MG PO CAPS: 2.0000 mg | ORAL_CAPSULE | Freq: Two times a day (BID) | ORAL | Status: DC

## 2017-03-14 MED ORDER — POTASSIUM CHLORIDE CRYS ER 20 MEQ PO TBCR
20.0000 meq | EXTENDED_RELEASE_TABLET | Freq: Every day | ORAL | Status: DC
  Administered 2017-03-14: 20 meq via ORAL
  Filled 2017-03-14: qty 1

## 2017-03-14 MED ORDER — HEPARIN SODIUM (PORCINE) 5000 UNIT/ML IJ SOLN
5000.0000 [IU] | Freq: Three times a day (TID) | INTRAMUSCULAR | Status: DC
  Administered 2017-03-15 – 2017-03-17 (×7): 5000 [IU] via SUBCUTANEOUS
  Filled 2017-03-14 (×7): qty 1

## 2017-03-14 MED ORDER — IPRATROPIUM-ALBUTEROL 0.5-2.5 (3) MG/3ML IN SOLN
3.0000 mL | Freq: Four times a day (QID) | RESPIRATORY_TRACT | Status: DC
  Administered 2017-03-15 (×2): 3 mL via RESPIRATORY_TRACT
  Filled 2017-03-14 (×2): qty 3

## 2017-03-14 MED ORDER — CARVEDILOL 3.125 MG PO TABS
3.1250 mg | ORAL_TABLET | Freq: Two times a day (BID) | ORAL | Status: DC
  Administered 2017-03-14 (×2): 3.125 mg via ORAL
  Filled 2017-03-14 (×2): qty 1

## 2017-03-14 MED ORDER — PANCRELIPASE (LIP-PROT-AMYL) 12000-38000 UNITS PO CPEP
72000.0000 [IU] | ORAL_CAPSULE | Freq: Three times a day (TID) | ORAL | Status: DC
Start: 2017-03-15 — End: 2017-03-20
  Administered 2017-03-15 – 2017-03-20 (×16): 72000 [IU] via ORAL
  Filled 2017-03-14 (×17): qty 6

## 2017-03-14 MED ORDER — LEVALBUTEROL HCL 1.25 MG/0.5ML IN NEBU: 1.2500 mg | INHALATION_SOLUTION | Freq: Four times a day (QID) | RESPIRATORY_TRACT | 12 refills | Status: DC | PRN

## 2017-03-14 MED ORDER — PANCRELIPASE (LIP-PROT-AMYL) 36000-114000 UNITS PO CPEP: 72000.0000 [IU] | ORAL_CAPSULE | Freq: Three times a day (TID) | ORAL | 0 refills | Status: DC

## 2017-03-14 MED ORDER — ASPIRIN EC 81 MG PO TBEC
81.0000 mg | DELAYED_RELEASE_TABLET | Freq: Every day | ORAL | Status: DC
  Administered 2017-03-15 – 2017-03-20 (×6): 81 mg via ORAL
  Filled 2017-03-14 (×6): qty 1

## 2017-03-14 MED ORDER — IPRATROPIUM-ALBUTEROL 0.5-2.5 (3) MG/3ML IN SOLN: 3.0000 mL | Freq: Four times a day (QID) | RESPIRATORY_TRACT | 0 refills | Status: DC

## 2017-03-14 MED ORDER — QUETIAPINE FUMARATE 25 MG PO TABS
25.0000 mg | ORAL_TABLET | Freq: Every day | ORAL | Status: DC
  Administered 2017-03-15 – 2017-03-19 (×5): 25 mg via ORAL
  Filled 2017-03-14 (×5): qty 1

## 2017-03-14 MED ORDER — CARVEDILOL 3.125 MG PO TABS
3.1250 mg | ORAL_TABLET | Freq: Two times a day (BID) | ORAL | Status: DC
  Administered 2017-03-15 – 2017-03-20 (×12): 3.125 mg via ORAL
  Filled 2017-03-14 (×11): qty 1

## 2017-03-14 MED ORDER — QUETIAPINE FUMARATE 25 MG PO TABS: 25.0000 mg | ORAL_TABLET | Freq: Every day | ORAL | 0 refills | Status: DC

## 2017-03-14 NOTE — Progress Notes (Signed)
Miranda Bellows MD, MRCP(U.K) [address]  [place]  Miranda [name], [place] [address]  Main: [place] is being followed for diarrhea  Day 2 of follow up   Subjective: Feels much better, only 1 formed BM since am    Objective: Vital signs in last 24 hours: Vitals:   03/14/17 0141 03/14/17 0457 03/14/17 0759 03/14/17 0902  BP:  124/62 140/64   Pulse:  69 81   Resp:      Temp:  98.5 F (36.9 C) 98.9 F (37.2 C)   TempSrc:  Oral Oral   SpO2: 98% 99% 98% 97%  Weight:  182 lb 3.2 oz (82.6 kg)    Height:       Weight change: -2 lb 1.7 oz (-0.955 kg)  Intake/Output Summary (Last 24 hours) at 03/14/17 1222 Last data filed at 03/14/17 1149  Gross per 24 hour  Intake              240 ml  Output              550 ml  Net             -310 ml     Exam: Heart:: Regular rate and rhythm, S1S2 present or without murmur or extra heart sounds Lungs: normal, clear to auscultation and clear to auscultation and percussion Abdomen: soft, nontender, normal bowel sounds   [name] Results: _0 @ Micro Results: Recent Results (from the past 240 hour(s))  Culture, bal-quantitative     Status: None   Collection Time: 03/07/17 12:37 PM  Result Value Ref Range Status   Specimen Description TRACHEAL ASPIRATE  Final   Special Requests Normal  Final   Gram Stain   Final    ABUNDANT WBC PRESENT, PREDOMINANTLY PMN NO ORGANISMS SEEN    Culture   Final    NO GROWTH 2 DAYS Performed at [name], Miranda [address], Miranda [name], [place] [address]    Report Status 03/09/2017 FINAL  Final  Gastrointestinal Panel by PCR , Stool     Status: None   Collection Time: 03/13/17  8:10 AM  Result Value Ref Range Status   Campylobacter species NOT DETECTED NOT DETECTED Final   Plesimonas shigelloides NOT DETECTED NOT DETECTED Final   Salmonella species NOT DETECTED NOT DETECTED Final   Yersinia enterocolitica NOT DETECTED NOT DETECTED Final   Vibrio species NOT DETECTED NOT  DETECTED Final   Vibrio cholerae NOT DETECTED NOT DETECTED Final   Enteroaggregative E coli (EAEC) NOT DETECTED NOT DETECTED Final   Enteropathogenic E coli (EPEC) NOT DETECTED NOT DETECTED Final   Enterotoxigenic E coli (ETEC) NOT DETECTED NOT DETECTED Final   Shiga like toxin producing E coli (STEC) NOT DETECTED NOT DETECTED Final   Shigella/Enteroinvasive E coli (EIEC) NOT DETECTED NOT DETECTED Final   Cryptosporidium NOT DETECTED NOT DETECTED Final   Cyclospora cayetanensis NOT DETECTED NOT DETECTED Final   Entamoeba histolytica NOT DETECTED NOT DETECTED Final   Giardia lamblia NOT DETECTED NOT DETECTED Final   Adenovirus F40/41 NOT DETECTED NOT DETECTED Final   Astrovirus NOT DETECTED NOT DETECTED Final   Norovirus GI/GII NOT DETECTED NOT DETECTED Final   Rotavirus A NOT DETECTED NOT DETECTED Final   Sapovirus (I, II, IV, and V) NOT DETECTED NOT DETECTED Final   Studies/Results: No results found. Medications: I have reviewed the patient's current medications. Scheduled Meds: . carvedilol  3.125 mg Oral BID WC  . enoxaparin (LOVENOX) injection  40 mg Subcutaneous Q24H  . insulin aspart  0-20 Units Subcutaneous Q4H  . ipratropium-albuterol  3 mL Nebulization Q6H  . lipase/protease/amylase  72,000 Units Oral TID AC  . loperamide  4 mg Oral BID AC  . potassium chloride  20 mEq Oral Daily  . QUEtiapine  25 mg Oral QHS  . sodium chloride flush  10-40 mL Intracatheter Q12H   Continuous Infusions: PRN Meds:.acetaminophen, atropine, sennosides **AND** docusate, levalbuterol, naLOXone (NARCAN)  injection, pneumococcal 23 valent vaccine, sodium chloride flush   Assessment: Principal Problem:   Cardiac arrest with ventricular fibrillation (HCC) Active Problems:   Cardiac arrest ([place])   Acute respiratory failure (HCC)   Hypokalemia   Encounter for central line placement   Cardiogenic shock ([place])   Acute pulmonary edema ([place])   Anoxic encephalopathy ([place])   Bradycardia  Miranda [name] 56 y.o. female admitted to [name] on 02/26/17 with V fibb arrest , fel;t likely secondary to low electrolytes from acute diarrhea .  She does suffer from a chronic diarrhea with recent worsening which could have been due to food poisoning . Celiac serology , stool PCR, C diff test negative   Plan  1. Diet to be low in fructose , no dairy for now.  2. Diarrhea improving  3. Avoid oral magnesium to supplement as it can cause diarrhea  4. F/u  fecal calpropectin  5. Colonoscopy+ EGD likely after she has  recovered from a pneumonia . Suggest changing from Olmesartan to a different drug for HTN, can be a different ace inhibitor as Olmesartan is known to cause microscopic colitis.  6. On  imodium and CREON Since feeling better will drop dose of imodium  7. Noted she is being transferred to [name] for an ICD    I will sign off.  Please call me if any further GI concerns or questions.  We would like to thank you for the opportunity to participate in the care of [name].   LOS: 16 days   Miranda [name] 03/14/2017, 12:22 PM

## 2017-03-14 NOTE — Progress Notes (Signed)
MEDICATION RELATED CONSULT NOTE  Pharmacy Consult for Electrolyte Monitoring Indication: Hypomagnesemia and hypokalemia  Allergies  Allergen Reactions  . Ace Inhibitors   . Compazine [Prochlorperazine Edisylate]   . Lisinopril   . Penicillins     Has patient had a PCN reaction causing immediate rash, facial/tongue/throat swelling, SOB or lightheadedness with hypotension: Unknown Has patient had a PCN reaction causing severe rash involving mucus membranes or skin necrosis: Unknown Has patient had a PCN reaction that required hospitalization: Unknown Has patient had a PCN reaction occurring within the last 10 years: Unknown If all of the above answers are "NO", then may proceed with Cephalosporin use.    Labs:  Recent Labs  03/12/17 0356 03/13/17 0416 03/14/17 0420  WBC  --   --  7.5  HGB  --   --  10.4*  HCT  --   --  30.3*  PLT  --   --  326  CREATININE 0.52 0.45 0.50  MG 1.8 2.0 2.1  PHOS 3.1  --   --    Estimated Creatinine Clearance: 76.4 mL/min (by C-G formula based on SCr of 0.5 mg/dL).  Sodium (mmol/L)  Date Value  03/14/2017 142  08/16/2016 141   Potassium (mmol/L)  Date Value  03/14/2017 4.4   Calcium (mg/dL)  Date Value  75/43/6067 8.8 (L)   Albumin (g/dL)  Date Value  70/34/0352 2.3 (L)    Assessment: 56 y/o F s/p cardiac arrest and hypothermia protocol with hypomagnesemia and hypokalemia.  Due to high risk of recurrent arrhythmia, cardiology wants a goal K of > or = 4 and Mg > or = 2. Per GI patient is not to receive oral magnesium replacement.   Plan:  Mg = 2, K = 4.4. Both are at goal.  Will order KCl 20 mEq PO daily.  Will recheck electrolytes with am labs.  Pharmacy will continue to monitor and adjust per consult.   Cindi Carbon, PharmD, BCPS Clinical Pharmacist 03/14/2017,10:20 AM

## 2017-03-14 NOTE — Progress Notes (Addendum)
error 

## 2017-03-14 NOTE — Progress Notes (Signed)
CH made a follow up visit. Pt was sitting up in bed. Friend was bedside. Pt stated she is waiting to be transferred to [facility] for rehab. CH conveyed congratulations and offered a prayer of thanksgiving.    03/14/17 1000  Clinical Encounter Type  Visited With Patient  Visit Type Follow-up  Consult/Referral To Chaplain  Spiritual Encounters  Spiritual Needs Prayer

## 2017-03-14 NOTE — Progress Notes (Signed)
Physical Therapy Treatment Patient Details Name: Miranda [name] MRN: [id number] DOB: 07/25/1960 Today's Date: 03/14/2017    History of Present Illness 56 yo female pt presented to ER after unresponsive episode/cardiac arrest in home; admitted for hospitalization secondary to cardiac arrest with ventricular fibrillation (requiring shock x3 prior to ROSC), status post hypothermia protocol (initiated 8/21, rewarming initiated 8/25). Underwent emergent cardiac cath upon arrival to facility, with no significant disease noted, but with significantly reduced EF (15%).  Failed initial extubation 8/25 requiring reintubation within minutes but successfully extubated AM 03/11/17.  MRI negative for acute insult, but some discussion of possible anoxic brain injury noted in chart.     PT Comments    Pt able to progress to ambulating 7 feet x4 repetitions for a total of 28 feet during session using RW.  Pt initially with moderate B lateral sway during ambulation but this improved with repetition.  Tends to sit quickly at times requiring cueing to line up to chair and for safe walker use.  Pt demonstrating some impulsiveness and distractibility during session but able to be redirected.  Will continue to focus on balance, strengthening, increasing ambulation distance, and increasing independence with functional mobility during hospital stay.   Follow Up Recommendations  CIR     Equipment Recommendations  Rolling walker with 5" wheels    Recommendations for Other Services Rehab consult     Precautions / Restrictions Precautions Precautions: Fall Precaution Comments: R PICC Restrictions Weight Bearing Restrictions: No    Mobility  Bed Mobility Overal bed mobility: Needs Assistance Bed Mobility: Supine to Sit;Sit to Supine     Supine to sit: Supervision;HOB elevated Sit to supine: Supervision;HOB elevated   General bed mobility comments: increased time/effort to perform; HOB elevated; use of bed  rails  Transfers Overall transfer level: Needs assistance Equipment used: Rolling walker (2 wheeled) Transfers: Sit to/from Stand Sit to Stand: Min guard;Min assist         General transfer comment: min assist to initiate stand initially; vc's for hand placement required  Ambulation/Gait Ambulation/Gait assistance: Min guard;Min assist Ambulation Distance (Feet):  (7 feet x4 (28 feet total)) Assistive device: Rolling walker (2 wheeled)   Gait velocity: decreased   General Gait Details: pt with moderate lateral sway B initially but improved some with repetition of ambulation   Stairs            Wheelchair Mobility    Modified Rankin (Stroke Patients Only)       Balance Overall balance assessment: Needs assistance Sitting-balance support: No upper extremity supported;Feet unsupported Sitting balance-Leahy Scale: Good Sitting balance - Comments: static sitting reaching within BOS   Standing balance support: Bilateral upper extremity supported Standing balance-Leahy Scale: Poor Standing balance comment: requires UE support on RW for static standing                            Cognition Arousal/Alertness: Awake/alert Behavior During Therapy: Impulsive Overall Cognitive Status: Within Functional Limits for tasks assessed                                 General Comments: Hoarse whisper/mouths words      Exercises      General Comments General comments (skin integrity, edema, etc.): Pt resting in bed upon PT entry.  Nursing cleared pt for participation in physical therapy.  Pt agreeable to PT session.  Pertinent Vitals/Pain Pain Assessment: No/denies pain  Vitals (HR and O2 on room air) stable and WFL throughout treatment session.    Home Living                      Prior Function            PT Goals (current goals can now be found in the care plan section) Acute Rehab PT Goals Patient Stated Goal: to get  stronger and more independent PT Goal Formulation: With patient Time For Goal Achievement: 03/25/17 Potential to Achieve Goals: Good Progress towards PT goals: Progressing toward goals    Frequency    7X/week      PT Plan Current plan remains appropriate    Co-evaluation              AM-PAC PT "6 Clicks" Daily Activity  Outcome Measure  Difficulty turning over in bed (including adjusting bedclothes, sheets and blankets)?: A Little Difficulty moving from lying on back to sitting on the side of the bed? : A Lot Difficulty sitting down on and standing up from a chair with arms (e.g., wheelchair, bedside commode, etc,.)?: Unable Help needed moving to and from a bed to chair (including a wheelchair)?: A Little Help needed walking in hospital room?: A Little Help needed climbing 3-5 steps with a railing? : A Lot 6 Click Score: 14    End of Session Equipment Utilized During Treatment: Gait belt Activity Tolerance: Patient tolerated treatment well Patient left: in bed;with call bell/phone within reach;with bed alarm set;with nursing/sitter in room Nurse Communication: Mobility status;Precautions PT Visit Diagnosis: Muscle weakness (generalized) (M62.81);Difficulty in walking, not elsewhere classified (R26.2);Other abnormalities of gait and mobility (R26.89)     Time: 8016-5537 PT Time Calculation (min) (ACUTE ONLY): 30 min  Charges:  $Gait Training: 8-22 mins $Therapeutic Activity: 8-22 mins                    G CodesHendricks Limes, PT 03/14/17, 4:26 PM [phone]

## 2017-03-14 NOTE — Progress Notes (Signed)
Patient Name: Miranda [name] Date of Encounter: 03/14/2017  Primary Cardiologist: New to [facility] - consult by End  Hospital Problem List     Principal Problem:   Cardiac arrest with ventricular fibrillation [facility]) Active Problems:   Cardiac arrest (HCC)   Acute respiratory failure (HCC)   Hypokalemia   Encounter for central line placement   Cardiogenic shock (HCC)   Acute pulmonary edema (HCC)   Anoxic encephalopathy (HCC)   Bradycardia     Subjective   No acute overnight events. Up sitting in bed conversing with her friend. Joking. No chest pain, SOB, or palpitations. Seen by EP on 9/6 with recommendation for possible cardiac MRI today at [facility] with ICD implantation on [place], 03/17/2017. Vitals stable. Labs stable.   Inpatient Medications    Scheduled Meds: . carvedilol  3.125 mg Oral BID WC  . enoxaparin (LOVENOX) injection  40 mg Subcutaneous Q24H  . insulin aspart  0-20 Units Subcutaneous Q4H  . ipratropium-albuterol  3 mL Nebulization Q6H  . lipase/protease/amylase  72,000 Units Oral TID AC  . loperamide  4 mg Oral BID AC  . QUEtiapine  25 mg Oral QHS  . sodium chloride flush  10-40 mL Intracatheter Q12H   Continuous Infusions:  PRN Meds: acetaminophen, atropine, sennosides **AND** docusate, levalbuterol, naLOXone (NARCAN)  injection, pneumococcal 23 valent vaccine, sodium chloride flush   Vital Signs    Vitals:   03/13/17 2046 03/14/17 0141 03/14/17 0457 03/14/17 0759  BP:   124/62 140/64  Pulse:   69 81  Resp:      Temp:   98.5 F (36.9 C) 98.9 F (37.2 C)  TempSrc:   Oral Oral  SpO2: 97% 98% 99% 98%  Weight:   182 lb 3.2 oz (82.6 kg)   Height:        Intake/Output Summary (Last 24 hours) at 03/14/17 0923 Last data filed at 03/14/17 0457  Gross per 24 hour  Intake                0 ml  Output              350 ml  Net             -350 ml   Filed Weights   03/12/17 0403 03/13/17 0431 03/14/17 0457  Weight: 183 lb 6.8 oz (83.2 kg) 184 lb 4.9 oz (83.6  kg) 182 lb 3.2 oz (82.6 kg)    Physical Exam    GEN: Well nourished, well developed, in no acute distress.  HEENT: Grossly normal. Hoarse voice.  Neck: Supple, no JVD, carotid bruits, or masses. Cardiac: RRR, no murmurs, rubs, or gallops. No clubbing, cyanosis, edema.  Radials/DP/PT 2+ and equal bilaterally.  Respiratory:  Respirations regular and unlabored, clear to auscultation bilaterally. GI: Soft, nontender, nondistended, BS + x 4. MS: no deformity or atrophy. Skin: warm and dry, no rash. Neuro:  Strength and sensation are intact. Psych: AAOx3.  Normal affect.  Labs    CBC  Recent Labs  03/14/17 0420  WBC 7.5  HGB 10.4*  HCT 30.3*  MCV 90.4  PLT 326   Basic Metabolic Panel  Recent Labs  03/12/17 0356  03/13/17 0416 03/14/17 0420  NA 143  --  145 142  K 3.0*  < > 4.1 4.4  CL 108  --  112* 111  CO2 27  --  25 25  GLUCOSE 92  --  106* 113*  BUN 25*  --  23* 25*  CREATININE 0.52  --  0.45 0.50  CALCIUM 8.4*  --  8.5* 8.8*  MG 1.8  --  2.0 2.1  PHOS 3.1  --   --   --   < > = values in this interval not displayed. Liver Function Tests No results for input(s): AST, ALT, ALKPHOS, BILITOT, PROT, ALBUMIN in the last 72 hours. No results for input(s): LIPASE, AMYLASE in the last 72 hours. Cardiac Enzymes No results for input(s): CKTOTAL, CKMB, CKMBINDEX, TROPONINI in the last 72 hours. BNP Invalid input(s): POCBNP D-Dimer No results for input(s): DDIMER in the last 72 hours. Hemoglobin A1C No results for input(s): HGBA1C in the last 72 hours. Fasting Lipid Panel No results for input(s): CHOL, HDL, LDLCALC, TRIG, CHOLHDL, LDLDIRECT in the last 72 hours. Thyroid Function Tests No results for input(s): TSH, T4TOTAL, T3FREE, THYROIDAB in the last 72 hours.  Invalid input(s): FREET3  Telemetry    NSR - Personally Reviewed  ECG    n/a - Personally Reviewed  Radiology    No results found.  Cardiac Studies   TTE 03/03/2017: Study Conclusions  - Left  ventricle: The cavity size was normal. There was mild concentric hypertrophy. Systolic function was normal. The estimated ejection fraction was in the range of 55% to 60%. Wall motion was normal; there were no regional wall motion abnormalities. - Aortic valve: There was mild regurgitation. - Pulmonary arteries: Systolic pressure was mildly to moderately increased. PA peak pressure: 46 mm Hg (S).  Impressions:  - Limited echo to evaluate ejection fraction. When compared to recent echo, LV systolic function normalized completely with improvement in ejection fraction of from 10% to 55%.  LHC 02/26/2017: Conclusion   Conclusions: 1. No angiographically significant coronary artery disease. 2. Severely reduced left ventricular contraction (<25%) with moderately elevated left ventricular filling pressure (LVEDP 28 mmHg).  Recommendations: 1. Hypothermia protocol per hospitalist and ICU teams. 2. Check magnesium level; replete electrolytes for goal potassium and magnesium greater than 4.0 and 2.0, respectively. 3. Consider gentle diuresis, given elevated LVEDP and severely reduced LVEF. 4. Obtain transthoracic echocardiogram    TTE 02/26/2017: Study Conclusions  - Left ventricle: The cavity size was mildly dilated. Systolic function was severely reduced. The estimated ejection fraction was <20%. Diffuse hypokinesis. Regional wall motion abnormalities cannot be excluded. The study is not technically sufficient to allow evaluation of LV diastolic function. - Mitral valve: There was mild regurgitation. - Right ventricle: Systolic function was mildly reduced. - Pulmonary arteries: Systolic pressure could not be accurately estimated. - Inferior vena cava: The vessel was normal in size. The respirophasic diameter changes were in the normal range (>= 50%), consistent with normal central venous pressure.   Patient Profile     56 y.o. female with  history of HTN, migraine disorder, and dependent edema who has had diarrhea for the past 2-3 days, taking up to 12 Imodium daily presented to [facility] with VF arrest. Cardiac cath showed no significant CAD.  Assessment & Plan    1. 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 Acute hypoxic respiratory failure: -Now extubated/ yesterday, sedation off  3. Acute systolic CHF: -Severely reduced LVEF by LV gram and by echocardiogram August 22 EF of 10-15% with global hypokinesis -Repeat echo 8/27 showed improved EF to 55-60%, no RWMA  -Treated with milrinone, discontinued   -Start Coreg -Consider ACEi, allergy noted, patient not aware -Consider spironolactone prior to discharge from [facility]  4. Cardiogenic shock: -Resolved  5. AKI: - Resolved  Signed, Eula Listen, PA-C [facility] Pager: [phone] 03/14/2017, 9:23 AM   Attending Note Patient seen and examined, agree with detailed note above,  Patient presentation and plan discussed on rounds.   Feels well morning, Legs feel like Jell-O, voice still very hoarse Denies significant shortness of breath, has not walked very far No significant arrhythmia on telemetry on review person by myself  Case discussed with Dr. Graciela Husbands Plan is for [facility] to [facility] Possible cardiac MRI prior to implantation of ICD if scheduled to be worked out Currently waiting for bed and  transfer  Low-dose carvedilol and ACE inhibitor started Normal LV function and most recent echocardiogram Other plan as above   Greater than 50% was spent in counseling and coordination of care with patient Total encounter time 25 minutes or more   Signed: Dossie Arbour  M.D., Ph.D. [facility]

## 2017-03-14 NOTE — H&P (Signed)
History and Physical  Patient ID: Charl Wellen MRN: [id number], DOB: [birth year] Admit Date: (Not on file) Date of Encounter: 03/14/2017, 10:27 AM Primary Physician: Merwyn Katos, MD Primary Cardiologist: Dr. Okey Dupre, MD  Chief Complaint: VF arrest Reason for Admission: VF arrest  HPI: 56 y.o. female with h/o HTN who presented to [facility] on 8/21 with VF arrest with urgent LHC showing nonobstructive CAD as detailed below.   Patient reportedly had diarrhea over the last 2-3 days for which she was taking up to 12 Imodium per day. She was trying to stay well hydrated and had also stopped taking her blood pressure medications on account of her GI illness. On the evening of 8/22, the patient's husband found the patient seated on the toilet. She attempted to stand up but passed out. Her husband was able to catch her and lower her to the ground. She remained unresponsive with agonal breathing and absent pulse. He began performing CPR. EMS arrived ~5-10 minutes later and administered a single shock via AED. When paramedics arrived, Ms. Summa was noted to be in ventricular fibrillation and received 2 additional shocks with restoration of sinus rhythm and return of a pulse. A king airway was placed and the patient transported to [facility] ED. She remained unresponsive and underwent CTA of the chest, which was negative for PE but showed bilateral airspace disease. Labs were notable for K 2.9 and lactic acid of 7.2. Initial troponin was 0.03. She underwent emergent LHC that showed no angiographically significant CAD with a severely reduced LVEF of < 25% and moderately elevated LVEDP at 28 mmHg. Echo showed an EF of 20-25%, diffuse HK, unable to exclude RWMA, mildly reduced RV systolic function. Hypothermia protocol was completed. She initially required norepinephrine, phenylephrine and epi gtts to maintian MAP > 65. During rewarming she was bradycardic leading to the holding of amiodarone. Pressors were weaned 8/23. She was  placed on milrione infusion with significant improvement. She failed extubation on 8/25 with difficult re-intubation. MRI showed no acute process. EEF showed slow progress. Repeat echo on 8/27 showed significantly improved EF to 55-60%, no RWMA, mild AI, PASP 41 mmHg. Milrinone was weaned. She underwent successful extubation on 9/4 and has remained on room air. Labs have significantly improved with AKI resolved and electrolytes at goal. She was seen by GI given diarrhea with recommendation for stool studies (pending) and outpatient EGD/colonoscopy. CXR showed multilobar PNA with bronchoscopy on 8/31 showing thick secretions with cultures negative. Empiric ABX stopped on 8/31. She was seen by EP on 9/6 with recommendation to transfer to [facility] for possible cardiac MRI followed by ICD implantation. She has ambulated with PR, continues to note generalized weakness. Sore throat from intubation.   Past Medical History:  Diagnosis Date  . Chronic systolic CHF (congestive heart failure) (HCC)    a. TTE 02/26/2017: EF < 20%, diffuse HK, mild AI, RV sys fxn mildly reduced; b. TTE 03/03/2017: EF 55-60%, nl WM, mild AI, PASP 46  . Coronary artery disease, non-occlusive    a. LHC 02/25/2017: no angiographically significant CAD, EF <35%, moderately elevated LVEDP at 28 mmHg  . Dependent edema    bilateral legs  . Hot flashes   . HTN (hypertension)   . Migraines   . Nausea   . Ventricular fibrillation (HCC)    a. 02/25/2017: LHC without signficant CAD; b. idiopathic; c. possibly 2/2 diarrhea with electrolyte abnormalities with Imodium usage     Most Recent Cardiac Studies: TTE 03/03/2017: Study Conclusions  -  Left ventricle: The cavity size was normal. There was mild concentric hypertrophy. Systolic function was normal. The estimated ejection fraction was in the range of 55% to 60%. Wall motion was normal; there were no regional wall motion abnormalities. - Aortic valve: There was mild  regurgitation. - Pulmonary arteries: Systolic pressure was mildly to moderately increased. PA peak pressure: 46 mm Hg (S).  Impressions:  - Limited echo to evaluate ejection fraction. When compared to recent echo, LV systolic function normalized completely with improvement in ejection fraction of from 10% to 55%.  LHC 02/26/2017: Conclusion   Conclusions: 1. No angiographically significant coronary artery disease. 2. Severely reduced left ventricular contraction (<25%) with moderately elevated left ventricular filling pressure (LVEDP 28 mmHg).  Recommendations: 1. Hypothermia protocol per hospitalist and ICU teams. 2. Check magnesium level; replete electrolytes for goal potassium and magnesium greater than 4.0 and 2.0, respectively. 3. Consider gentle diuresis, given elevated LVEDP and severely reduced LVEF. 4. Obtain transthoracic echocardiogram    TTE 02/26/2017: Study Conclusions  - Left ventricle: The cavity size was mildly dilated. Systolic function was severely reduced. The estimated ejection fraction was <20%. Diffuse hypokinesis. Regional wall motion abnormalities cannot be excluded. The study is not technically sufficient to allow evaluation of LV diastolic function. - Mitral valve: There was mild regurgitation. - Right ventricle: Systolic function was mildly reduced. - Pulmonary arteries: Systolic pressure could not be accurately estimated. - Inferior vena cava: The vessel was normal in size. The respirophasic diameter changes were in the normal range (>= 50%), consistent with normal central venous pressure.   Surgical History:  Past Surgical History:  Procedure Laterality Date  . APPENDECTOMY  1974  . CHOLECYSTECTOMY  1984  . LEFT HEART CATH AND CORONARY ANGIOGRAPHY N/A 02/26/2017   Procedure: LEFT HEART CATH AND CORONARY ANGIOGRAPHY;  Surgeon: Yvonne Kendall, MD;  Location: [facility] INVASIVE CV LAB;  Service: Cardiovascular;   Laterality: N/A;  . TONSILLECTOMY  1964     Home Meds: Prior to Admission medications   Medication Sig Start Date End Date Taking? Authorizing Provider  acetaminophen (TYLENOL) 500 MG tablet Take 1,000 mg by mouth every 6 (six) hours as needed.    [provider]  amLODipine (NORVASC) 10 MG tablet Take 10 mg by mouth daily.    [provider]  hydrochlorothiazide (HYDRODIURIL) 25 MG tablet Take 1 tablet (25 mg total) by mouth daily. 08/16/16 02/26/18  Antonieta Iba, MD  ibuprofen (ADVIL,MOTRIN) 200 MG tablet Take 600 mg by mouth every 6 (six) hours as needed.    [provider]  olmesartan (BENICAR) 40 MG tablet Take 1 tablet (40 mg total) by mouth daily. 01/16/17   Antonieta Iba, MD  Olmesartan-Amlodipine-HCTZ 40-10-25 MG TABS Take 1 tablet by mouth daily. Patient not taking: Reported on 02/26/2017 08/20/16   Antonieta Iba, MD  ondansetron (ZOFRAN-ODT) 4 MG disintegrating tablet Take 4 mg by mouth every 8 (eight) hours as needed for nausea. 01/13/17   [provider]  potassium chloride (K-DUR) 10 MEQ tablet Take 1 tablet (10 mEq total) by mouth daily as needed. Patient not taking: Reported on 02/26/2017 08/20/16 11/18/16  Antonieta Iba, MD    Allergies:  Allergies  Allergen Reactions  . Ace Inhibitors   . Compazine [Prochlorperazine Edisylate]   . Lisinopril   . Penicillins     Has patient had a PCN reaction causing immediate rash, facial/tongue/throat swelling, SOB or lightheadedness with hypotension: Unknown Has patient had a PCN reaction causing severe rash  involving mucus membranes or skin necrosis: Unknown Has patient had a PCN reaction that required hospitalization: Unknown Has patient had a PCN reaction occurring within the last 10 years: Unknown If all of the above answers are "NO", then may proceed with Cephalosporin use.     Social History   Social History  . Marital status: Married    Spouse name: N/A  . Number of children:  N/A  . Years of education: N/A   Occupational History  . Not on file.   Social History Main Topics  . Smoking status: Never Smoker  . Smokeless tobacco: Never Used  . Alcohol use No  . Drug use: No  . Sexual activity: Not on file   Other Topics Concern  . Not on file   Social History Narrative  . No narrative on file     Family History  Problem Relation Age of Onset  . Lymphoma Mother   . Heart disease Father   . Stroke Father   . Hyperlipidemia Father   . Hypertension Father   . Hypertension Sister   . Migraines Sister   . Hypertension Brother     Review of Systems: Review of Systems  Constitutional: Positive for malaise/fatigue. Negative for chills, diaphoresis, fever and weight loss.  HENT: Positive for sore throat. Negative for congestion.   Eyes: Negative for discharge and redness.  Respiratory: Negative for cough, hemoptysis, sputum production, shortness of breath and wheezing.   Cardiovascular: Negative for chest pain, palpitations, orthopnea, claudication, leg swelling and PND.  Gastrointestinal: Negative for abdominal pain, blood in stool, constipation, diarrhea, heartburn, melena, nausea and vomiting.  Genitourinary: Negative for hematuria.  Musculoskeletal: Negative for falls and myalgias.  Skin: Negative for rash.  Neurological: Positive for weakness. Negative for dizziness, tingling, tremors, sensory change, speech change, focal weakness and loss of consciousness.  Endo/Heme/Allergies: Does not bruise/bleed easily.  Psychiatric/Behavioral: Negative for substance abuse. The patient is not nervous/anxious.   All other systems reviewed and are negative.   Labs:   Lab Results  Component Value Date   WBC 7.5 03/14/2017   HGB 10.4 (L) 03/14/2017   HCT 30.3 (L) 03/14/2017   MCV 90.4 03/14/2017   PLT 326 03/14/2017     Recent Labs Lab 03/14/17 0420  NA 142  K 4.4  CL 111  CO2 25  BUN 25*  CREATININE 0.50  CALCIUM 8.8*  GLUCOSE 113*   No  results for input(s): CKTOTAL, CKMB, TROPONINI in the last 72 hours. Lab Results  Component Value Date   TRIG 119 03/10/2017   No results found for: DDIMER  Radiology/Studies:  Dg Chest 1 View  Result Date: 02/26/2017 CLINICAL DATA:  Central line placement EXAM: CHEST 1 VIEW COMPARISON:  02/25/2017 CXR and CT FINDINGS: New left IJ approach central line catheter tip is seen at the cavoatrial junction. No pneumothorax. There is cardiomegaly without aortic aneurysm. Diffuse airspace opacities consistent with pulmonary edema is noted. Superimposed pneumonia would be difficult to entirely exclude. Satisfactory endotracheal and gastric tube positions with endotracheal tube tip 5.3 cm above the carina and gastric tube extending into the expected location the stomach. Cholecystectomy clips are seen in the right upper quadrant. IMPRESSION: 1. Satisfactory support line and tube positions. 2. New left IJ approach central venous line is noted with tip at the cavoatrial junction. No pneumothorax. 3. Cardiomegaly with diffuse bilateral airspace opacities suspicious for pulmonary edema. Superimposed pneumonia would be difficult to entirely exclude. Electronically Signed   By: Rene Kocher.D.  On: 02/26/2017 03:28   Dg Abd 1 View  Result Date: 03/07/2017 CLINICAL DATA:  NG tube placement. EXAM: ABDOMEN - 1 VIEW COMPARISON:  March 01, 2017. FINDINGS: Interval removal of previously seen nasogastric tube. Minimally prominent air-filled loops of small bowel. Cholecystectomy. IMPRESSION: Interval removal of enteric tube. Minimally prominent air-filled loops of small bowel may reflect ileus. Electronically Signed   By: Obie Dredge M.D.   On: 03/07/2017 12:07   Dg Abd 1 View  Result Date: 03/01/2017 CLINICAL DATA:  NG tube placement EXAM: ABDOMEN - 1 VIEW COMPARISON:  02/26/2017 FINDINGS: Enteric tube terminates in the distal gastric body. No evidence of small bowel obstruction. Mild gaseous distention of a loop  of colon. IMPRESSION: Enteric tube terminates in the distal gastric body. Electronically Signed   By: Charline Bills M.D.   On: 03/01/2017 15:02   Ct Head Wo Contrast  Result Date: 02/25/2017 CLINICAL DATA:  Focal neuro deficit, greater than 6 hours. Stroke suspected. Unresponsive, post CPR. EXAM: CT HEAD WITHOUT CONTRAST TECHNIQUE: Contiguous axial images were obtained from the base of the skull through the vertex without intravenous contrast. COMPARISON:  None. FINDINGS: Brain: No evidence of acute infarction, hemorrhage, hydrocephalus, extra-axial collection or mass lesion/mass effect. Gray-white differentiation is preserved, no cerebral edema. Vascular: Atherosclerosis of skullbase vasculature without hyperdense vessel or abnormal calcification. Skull: Normal. Negative for fracture or focal lesion. Sinuses/Orbits: Paranasal sinuses and mastoid air cells are clear. The visualized orbits are unremarkable. Other: None. IMPRESSION: No acute intracranial abnormality. Electronically Signed   By: Rubye Oaks M.D.   On: 02/25/2017 23:18   Ct Chest Wo Contrast  Result Date: 03/10/2017 CLINICAL DATA:  Acute respiratory illness EXAM: CT CHEST WITHOUT CONTRAST TECHNIQUE: Multidetector CT imaging of the chest was performed following the standard protocol without IV contrast. COMPARISON:  Radiograph 03/10/2017, CT chest 02/25/2017 FINDINGS: Cardiovascular: Limited evaluation without intravenous contrast. Right upper extremity catheter tip positioned at the cavoatrial junction. Mild atherosclerotic calcification. Minimal coronary artery calcification. Borderline to mild cardiomegaly. Negative for large pericardial effusion. Mediastinum/Nodes: Stable small mediastinal lymph nodes. Largest is seen in the right paratracheal space and measures 9 mm. Endotracheal tube tip terminates just above the carina. Esophageal tube tip visualized to the mid stomach. Lungs/Pleura: Negative for pneumothorax or significant pleural  effusion. Scattered bilateral ground-glass densities including a more focused area of consolidation in the peripheral right upper lobe, corresponding to the radiographic abnormality overall the lung aeration has improved since prior CT from 02/25/2017. Upper Abdomen: Surgical clips in the gallbladder fossa. No acute abnormality. Musculoskeletal: Motion artifact causes false appearance of lower and upper sternal fracture. Acute right fifth anterior rib fracture. IMPRESSION: 1. Mild to moderate scattered bilateral ground-glass densities, favored to represent multifocal pneumonia, overall the aeration of the lung fields is improved compared to the prior chest CT. 2. Endotracheal tube tip is just above the carina 3. Acute to subacute right fifth anterior rib fracture Aortic Atherosclerosis (ICD10-I70.0). Electronically Signed   By: Jasmine Pang M.D.   On: 03/10/2017 14:21   Mr Brain Wo Contrast  Result Date: 03/04/2017 CLINICAL DATA:  Encephalopathy. Recent diarrhea and cardiac arrest. Not following commands. EXAM: MRI HEAD WITHOUT CONTRAST TECHNIQUE: Multiplanar, multiecho pulse sequences of the brain and surrounding structures were obtained without intravenous contrast. COMPARISON:  Head CT from 7 days ago FINDINGS: Brain: No acute infarction, hemorrhage, hydrocephalus, extra-axial collection or mass lesion. No evidence of brain swelling. Few FLAIR hyperintensities in the cerebral white matter, often attributed to chronic microvascular  ischemia. No specific demyelinating pattern. Negative for brain atrophy. Vascular: Major flow voids are preserved. Skull and upper cervical spine: Negative for marrow lesion Sinuses/Orbits: Partial bilateral mastoid opacification in the setting of nasopharyngeal and oropharyngeal fluid levels in this intubated patient. IMPRESSION: No acute finding including evidence of anoxic injury. Electronically Signed   By: Marnee Spring M.D.   On: 03/04/2017 15:40   Dg Chest Port 1  View  Result Date: 03/10/2017 CLINICAL DATA:  Acute respiratory failure, ventilatory support EXAM: PORTABLE CHEST 1 VIEW COMPARISON:  03/09/2017 FINDINGS: Endotracheal tube is 3.4 cm above the carina. NG tube within the stomach, tip not visualized. Right PICC line tip mid SVC level. Stable cardiomegaly with vascular congestion and diffuse mixed airspace and interstitial opacities versus edema. No enlarging effusion or pneumothorax. Right peripheral mid lung nodular opacity remains indeterminate for superimposed shadows or lung nodule. Recommend attention on follow-up exams. IMPRESSION: Stable cardiomegaly with similar diffuse mixed interstitial and airspace opacities, suspicious for infection versus edema. No enlarging effusion or pneumothorax Possible right midlung peripheral lung nodule. Electronically Signed   By: Judie Petit.  Shick M.D.   On: 03/10/2017 09:29   Dg Chest Port 1 View  Result Date: 03/09/2017 CLINICAL DATA:  Respiratory failure.  Intubated. EXAM: PORTABLE CHEST 1 VIEW COMPARISON:  03/09/2015. FINDINGS: Endotracheal tube in satisfactory position. Nasogastric tube extending into the stomach. Right PICC tip in the inferior aspect of the superior vena cava. Stable enlarged cardiac silhouette and bilateral interstitial opacities. Unremarkable bones. IMPRESSION: Stable cardiomegaly and interstitial lung disease. Electronically Signed   By: Beckie Salts M.D.   On: 03/09/2017 08:30   Dg Chest Port 1 View  Result Date: 03/08/2017 CLINICAL DATA:  Acute respiratory failure.  Intubated. EXAM: PORTABLE CHEST 1 VIEW COMPARISON:  Yesterday. FINDINGS: Endotracheal tube in satisfactory position. Stable enlarged cardiac silhouette. Stable diffuse prominence of the interstitial markings with no definite superimposed airspace opacity at this time. No pleural fluid. Nasogastric tube extending into the stomach. Unremarkable bones. IMPRESSION: Stable cardiomegaly and interstitial lung disease with no definite superimposed  alveolar edema or pneumonia at this time. Electronically Signed   By: Beckie Salts M.D.   On: 03/08/2017 18:01   Dg Chest Port 1 View  Result Date: 03/07/2017 CLINICAL DATA:  Evaluate for a nasogastric tube. EXAM: PORTABLE CHEST 1 VIEW COMPARISON:  03/07/2017 FINDINGS: Nasogastric tube has been pulled back and it is now in the upper chest near the thoracic inlet. Endotracheal tube is roughly 4.1 cm above the carina. There continues to be enlarged interstitial and vascular markings in both lungs. Cardiac silhouette is upper limits of normal and stable. Central line tip in the SVC region. Negative for a pneumothorax. IMPRESSION: Nasogastric tube has been pulled back and located in the upper chest as described. Persistent patchy lung densities suggestive for pulmonary edema and/or airspace disease. Support apparatuses as described. Electronically Signed   By: Richarda Overlie M.D.   On: 03/07/2017 12:11   Dg Chest Port 1 View  Result Date: 03/07/2017 CLINICAL DATA:  Acute respiratory failure . EXAM: PORTABLE CHEST 1 VIEW COMPARISON:  03/06/2017 . FINDINGS: Endotracheal tube, NG tube, right PICC line stable position. Stable cardiomegaly. Diffuse bilateral pulmonary interstitial prominence consistent CHF again noted. Slight interval clearing. Small left pleural effusion. No pneumothorax . IMPRESSION: 1. Lines and tubes in stable position. 2. Cardiomegaly with diffuse bilateral from interstitial prominence consistent CHF again noted. Slight interval clearing. Small left pleural effusion. Electronically Signed   By: Maisie Fus  Register  On: 03/07/2017 06:17   Dg Chest Port 1 View  Result Date: 03/06/2017 CLINICAL DATA:  Respiratory failure EXAM: PORTABLE CHEST 1 VIEW COMPARISON:  03/05/2017 FINDINGS: Endotracheal tube and NG tube as well as right PICC line remain in place, unchanged. Diffuse bilateral airspace disease, slightly worsened since prior study. Cardiomegaly. IMPRESSION: Worsening diffuse bilateral airspace  disease, likely edema/ CHF. Electronically Signed   By: Charlett Nose M.D.   On: 03/06/2017 07:11   Dg Chest Port 1 View  Result Date: 03/05/2017 CLINICAL DATA:  Post cardiac arrest.  Respiratory failure. EXAM: PORTABLE CHEST 1 VIEW COMPARISON:  03/04/2017 FINDINGS: Endotracheal tube terminates 11 mm above the carina. Retraction by approximately 2 cm is recommended. Enteric catheter tip is collimated off the image. Right PICC line in stable position with tip overlying the expected position of superior vena cava. Mildly enlarged cardiomediastinal silhouette. No evidence of pneumothorax. Low lung volumes with mixed pattern pulmonary edema. Osseous structures are without acute abnormality. Soft tissues are grossly normal. IMPRESSION: Endotracheal tube terminates 11 mm above the carina. Retraction by approximately 2 cm is recommended. Mixed pattern pulmonary edema. Borderline enlarged cardiomediastinal silhouette. This may be due to portable AP technique, however vascular abnormality cannot be excluded. Please correlate clinically. These results will be called to the ordering clinician or representative by the Radiologist Assistant, and communication documented in the PACS or zVision Dashboard. Electronically Signed   By: Ted Mcalpine M.D.   On: 03/05/2017 12:37   Dg Chest Port 1 View  Result Date: 03/04/2017 CLINICAL DATA:  Respiratory failure. EXAM: PORTABLE CHEST 1 VIEW COMPARISON:  03/03/2017. FINDINGS: Endotracheal tube, NG tube, right PICC line in stable position. Heart size normal. Diffuse bilateral pulmonary infiltrates/edema again noted. Slight improvement from prior exam. Small left pleural effusion again noted. No pneumothorax. IMPRESSION: 1. Lines and tubes stable position. 2. Diffuse bilateral pulmonary infiltrates/edema again noted. Slight improvement from prior exam. Small left pleural effusion again noted. Electronically Signed   By: Maisie Fus  Register   On: 03/04/2017 06:42   Dg Chest Port  1 View  Result Date: 03/03/2017 CLINICAL DATA:  Respiratory failure. EXAM: PORTABLE CHEST 1 VIEW COMPARISON:  03/02/2017. FINDINGS: Endotracheal tube, NG tube, right PICC line stable position. Heart size stable. Diffuse bilateral pulmonary infiltrates. Small left pleural effusion. No pneumothorax . IMPRESSION: 1.  Lines and tubes in stable position. 2. Diffuse bilateral pulmonary infiltrates/ edema. Small left pleural effusion. No interim change. Electronically Signed   By: Maisie Fus  Register   On: 03/03/2017 06:37   Dg Chest Port 1 View  Result Date: 03/02/2017 CLINICAL DATA:  Acute respiratory failure. On ventilator. Cardiac arrest with ventricular fibrillation. EXAM: PORTABLE CHEST 1 VIEW COMPARISON:  03/01/2017 FINDINGS: Endotracheal tube remains in appropriate position. New nasogastric tube is seen entering the stomach. New right arm PICC line is seen with tip in the region of the cavoatrial junction. Stable mild cardiomegaly. Previously seen pneumomediastinum no longer visualized. No evidence of pneumothorax. Mild diffuse bilateral pulmonary airspace disease shows no significant interval change. IMPRESSION: Stable cardiomegaly and diffuse bilateral airspace disease/ edema. Interval resolution of pneumomediastinum since previous study. No evidence of pneumothorax. Electronically Signed   By: Myles Rosenthal M.D.   On: 03/02/2017 09:38   Dg Chest Port 1 View  Addendum Date: 03/01/2017   ADDENDUM REPORT: 03/01/2017 06:41 ADDENDUM: Study discussed by telephone with NP Dinzy on 03/01/2017 at 0635 hours. She advised the patient had been extubated and was re-intubated at 0430 hours today, and that the re-intubation was  traumatic. Therefore, favor that trauma as the origin of the pneumomediastinum and soft tissue gas. I advised that if this gas accumulations seem to be progressing on clinical exam or subsequent radiographs then further evaluation of the extent of trauma would be recommended, such as with Chest CT  (IV contrast preferred). Electronically Signed   By: Odessa Fleming M.D.   On: 03/01/2017 06:41   Result Date: 03/01/2017 CLINICAL DATA:  56 year old female cardiac arrest with V fib. Intubated. EXAM: PORTABLE CHEST 1 VIEW COMPARISON:  02/28/2017 and earlier. FINDINGS: Portable AP semi upright view at 0542 hours. Endotracheal tube tip projects about 10 mm above the carina. Left IJ central line appears stable. Enteric tube has been removed. New pneumomediastinum and supraclavicular subcutaneous gas. There is no pneumothorax identified. There is moderate gaseous distension of the visible stomach. Increasing left lower lobe collapse or consolidation. Continued bilateral hazy pulmonary opacity. No pleural effusion identified. Stable cardiac size and mediastinal contours. IMPRESSION: 1. New pneumomediastinum and supraclavicular subcutaneous emphysema. Etiology is unclear, with no pneumothorax identified. 2. Enteric tube removed. ET tube tip 10 mm above the carina and stable left IJ central line. 3. New left lower lobe collapse or consolidation. Continued diffuse hazy pulmonary opacity which might reflect pulmonary edema or ARDS. 4. Interval gaseous distension of the stomach. Electronically Signed: By: Odessa Fleming M.D. On: 03/01/2017 06:19   Dg Chest Port 1 View  Result Date: 02/28/2017 CLINICAL DATA:  Respiratory failure. EXAM: PORTABLE CHEST 1 VIEW COMPARISON:  09/2016 FINDINGS: Endotracheal tube, left IJ line, NG tube in stable position. Heart size stable. Persistent bilateral airspace disease again noted without significant change. No prominent pleural effusion. No pneumothorax. IMPRESSION: 1. Lines and tubes in stable position. 2. Persistent bilateral diffuse airspace disease without significant change. Electronically Signed   By: Maisie Fus  Register   On: 02/28/2017 06:21   Dg Chest Port 1 View  Result Date: 02/27/2017 CLINICAL DATA:  Respiratory failure. EXAM: PORTABLE CHEST 1 VIEW COMPARISON:  02/26/2017. FINDINGS:  Endotracheal tube, left IJ line, NG tube in stable position. Heart size stable. Diffuse bilateral airspace disease again noted. Slight interim improvement. No prominent pleural effusion or pneumothorax. IMPRESSION: 1. Lines and tubes in stable position. 2. Diffuse bilateral airspace disease again noted. Slight interim improvement. Electronically Signed   By: Maisie Fus  Register   On: 02/27/2017 06:23   Dg Chest Portable 1 View  Result Date: 02/25/2017 CLINICAL DATA:  Status post intubation. EXAM: PORTABLE CHEST 1 VIEW COMPARISON:  None. FINDINGS: The heart size and mediastinal contours are within normal limits. Endotracheal tube is seen projected over tracheal air shadow with distal tip 4 cm above the carina. Nasogastric tube is seen looped within proximal stomach. Bilateral lung opacities are noted concerning for edema or possibly inflammation. No definite pneumothorax or pleural effusion is noted. The visualized skeletal structures are unremarkable. IMPRESSION: Endotracheal and nasogastric tubes in grossly good position. Mild bilateral lung opacities are noted concerning for edema or possibly inflammation. Electronically Signed   By: Lupita Raider, M.D.   On: 02/25/2017 23:01   Dg Abd Portable 1v  Result Date: 03/07/2017 CLINICAL DATA:  Repositioning of NG tube. EXAM: PORTABLE ABDOMEN - 1 VIEW COMPARISON:  One-view abdomen 03/07/2017 FINDINGS: The side port of the NG tube is now in the antrum of the stomach. By gas pattern is normal. Bibasilar airspace disease remains, left greater than right. Left pleural effusion is present. IMPRESSION: 1. NG tube in place.  The side port is in the antrum  of the stomach. 2. Normal bowel gas pattern. 3. Bibasilar airspace disease/lobar pneumonia. Electronically Signed   By: Marin Roberts M.D.   On: 03/07/2017 13:09   Dg Abd Portable 1v  Result Date: 02/26/2017 CLINICAL DATA:  56 y/o  F; encounter for feeding tube placement. EXAM: PORTABLE ABDOMEN - 1 VIEW  COMPARISON:  None. FINDINGS: Right hemiabdomen is excluded from field of view. Normal bowel gas pattern. Enteric tube tip projects over the distal stomach. Cholecystectomy clips. Bones are unremarkable. IMPRESSION: Enteric tube tip projects over distal stomach. Electronically Signed   By: Mitzi Hansen M.D.   On: 02/26/2017 14:39   Ct Angio Chest Aorta W And/or Wo Contrast  Result Date: 02/25/2017 CLINICAL DATA:  Unwitnessed fall, unresponsive. EXAM: CT ANGIOGRAPHY CHEST WITH CONTRAST TECHNIQUE: Multidetector CT imaging of the chest was performed using the standard protocol during bolus administration of intravenous contrast. Multiplanar CT image reconstructions and MIPs were obtained to evaluate the vascular anatomy. CONTRAST:  100 mL of Isovue 370 intravenously. COMPARISON:  Radiograph of same day. FINDINGS: Cardiovascular: Suboptimal opacification of pulmonary arteries is noted due to timing of contrast bolus as well as respiratory motion artifact. Large central pulmonary embolus is not identified in the main pulmonary artery or the main portions of the right or left pulmonary emboli. However, smaller peripheral pulmonary emboli cannot be excluded on the basis of this exam. There is no evidence of thoracic aortic dissection or aneurysm. No pericardial effusion is noted. Mediastinum/Nodes: Endotracheal tube is in grossly good position. No mediastinal adenopathy is noted. Thyroid gland is unremarkable. Lungs/Pleura: No pneumothorax or pleural effusion is noted. Bilateral diffuse lung opacities are noted concerning for edema or pneumonia. Upper Abdomen: Nasogastric tube tip is seen in distal stomach. Musculoskeletal: No chest wall abnormality. No acute or significant osseous findings. Review of the MIP images confirms the above findings. IMPRESSION: Suboptimal opacification of pulmonary arteries is noted due to timing of contrast bolus as well as respiratory motion artifact. Large central pulmonary  embolus is not identified, but smaller peripheral emboli cannot be excluded on the basis of this exam. Bilateral lung opacities are noted concerning for edema or pneumonia. Endotracheal and nasogastric tubes appear to be in good position. Electronically Signed   By: Lupita Raider, M.D.   On: 02/25/2017 23:35     EKG: Interpreted by me showed: admission EKG sinus tachycardia, 115 bpm, 1st degree AV block, nonspecific st/t changes Telemetry: Interpreted by me showed: NSR  Weights:      Filed Weights   03/12/17 0403 03/13/17 0431 03/14/17 0457  Weight: 183 lb 6.8 oz (83.2 kg) 184 lb 4.9 oz (83.6 kg) 182 lb 3.2 oz (82.6 kg)    Vitals:   03/13/17 2046 03/14/17 0141 03/14/17 0457 03/14/17 0759  BP:   124/62 140/64  Pulse:   69 81  Resp:      Temp:   98.5 F (36.9 C) 98.9 F (37.2 C)  TempSrc:   Oral Oral  SpO2: 97% 98% 99% 98%  Weight:   182 lb 3.2 oz (82.6 kg)     Physical Exam: General: Well developed, well nourished, in no acute distress. Head: Normocephalic, atraumatic, sclera non-icteric, no xanthomas, nares are without discharge. Hoarse voice.  Neck: Negative for carotid bruits. JVD not elevated. Lungs: Clear bilaterally to auscultation without wheezes, rales, or rhonchi. Breathing is unlabored. Heart: RRR with S1 S2. No murmurs, rubs, or gallops appreciated. Abdomen: Soft, non-tender, non-distended with normoactive bowel sounds. No hepatomegaly. No rebound/guarding. No obvious  abdominal masses. Msk:  Strength and tone appear normal for age. Extremities: No clubbing or cyanosis. No edema. Distal pedal pulses are 2+ and equal bilaterally. Neuro: Alert and oriented X 3. No focal deficit. No facial asymmetry. Moves all extremities spontaneously. Psych:  Responds to questions appropriately with a normal affect.    ASSESSMENT AND PLAN:  Principal Problem:   Cardiac arrest with ventricular fibrillation (HCC) Active Problems:   Acute respiratory failure (HCC)    Hypokalemia   Cardiogenic shock (HCC)   Anoxic encephalopathy (HCC)   Hypertension   Acute pulmonary edema (HCC)   Bradycardia   AKI (acute kidney injury) (HCC)   Diarrhea   1. 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 Acute hypoxic respiratory failure: -Now extubated/ yesterday, sedation off  3. Acute systolic CHF: -Severely reduced LVEF by LV gram and by echocardiogram August 22EF of 10-15% with global hypokinesis -Repeat echo 8/27 showed improved EF to 55-60%, no RWMA -Treated with milrinone, discontinued  -Start Coreg -Losartan 12.5 mg daily (tolerated ARB as an outpatient) -Consider spironolactone prior to discharge from [facility]  4. Cardiogenic shock: -Resolved  5. AKI: -Resolved  6. Diarrhea: -Resolved -Seen by GI, outpatient EGD/colonoscopy -Studies pending  -Hold Imodium   7. Possible multilobar PNA: -S/p bronch -Culture negative -Off ABX as of 8/31  8. Electrolyte abnormalities: -Resolved  -Continue to monitor daily   Signed, Carola Frost [facility] Pager: [phone] 03/14/2017, 10:27 AM

## 2017-03-14 NOTE — Progress Notes (Signed)
Patient is alert and oriented x 4.  V/signs are stable . Report was given to Werner Lean, RN (carelink) and Cephas Darby RN at [employer].  trpple Lumen PiCC intact to RUA. Patient just left the floor accompanied by 3 [facility] personnels.

## 2017-03-14 NOTE — Progress Notes (Signed)
pnt resting overnight no issues or concerns. Pnt has denied pain or discomfort. Pnt tolerated a Glucerna milkshake tonight without complaint. Pnt has a triple lumen PICC in RUA all ports/lumens  flush and have blood return except  grey port. Will pass on to dayshift nurse to have MD address issue. Otherwise pnt appears comfortable and asleep. Bed low, locked and call bell in reach. Pnt friend at bedside and is supportive. Will continue to monitor and assess.

## 2017-03-14 NOTE — Discharge Summary (Signed)
Miranda [name] at Miranda [name] NAME: Miranda [name]    MR#:  [id number]  DATE OF BIRTH:  02-02-1961  DATE OF ADMISSION:  02/25/2017 ADMITTING PHYSICIAN: Miranda Bush, MD  DATE OF DISCHARGE: 03/14/2017  PRIMARY CARE PHYSICIAN: Miranda Mcardle, MD    ADMISSION DIAGNOSIS:  Hypokalemia [E87.6] Acute respiratory failure, unspecified whether with hypoxia or hypercapnia ([place]) [J96.00] Cardiac arrest with ventricular fibrillation ([place]) [I46.9, I49.01] Cardiac arrest ([place]) [I46.9]  DISCHARGE DIAGNOSIS:  Principal Problem:   Cardiac arrest with ventricular fibrillation ([place]) Active Problems:   Cardiac arrest ([place])   Acute respiratory failure ([place])   Hypokalemia   Encounter for central line placement   Cardiogenic shock ([place])   Acute pulmonary edema (H[facility])   Anoxic encephalopathy ([place])   Bradycardia  SECONDARY DIAGNOSIS:   Past Medical History:  Diagnosis Date  . Chronic systolic CHF (congestive heart failure) ([place])    a. TTE 02/26/2017: EF < 20%, diffuse HK, mild AI, RV sys fxn mildly reduced; b. TTE 03/03/2017: EF 55-60%, nl WM, mild AI, PASP 46  . Coronary artery disease, non-occlusive    a. LHC 02/25/2017: no angiographically significant CAD, EF <35%, moderately elevated LVEDP at 28 mmHg  . Dependent edema    bilateral legs  . Hot flashes   . HTN (hypertension)   . Migraines   . Nausea   . Ventricular fibrillation ([place])    a. 02/25/2017: LHC without signficant CAD; b. idiopathic; c. possibly 2/2 diarrhea with electrolyte abnormalities with Imodium usage    HOSPITAL COURSE:   MirandaStolleyreportedly had diarrhea over the last 2-3 days for which she was taking up to 12 Imodium per day. She was trying to stay well hydrated and had also stopped taking her blood pressure medications on account of her GI illness. Around 9 PM, Miranda [name]'s husband found the patient seated on the toilet. She attempted to stand up but passed out.  * Cardiac  arrest due to ventricular fibrillation with severe cardiomyopathy -Patient underwent urgent Catheterization  showed no significant coronary artery disease. Severely reduced LVEF less than <20%  -repeat echo showed EF of 60-65% -Patient was on IV levophed,Milrinone- improved now. - Was IV propofol -Patient was  on hypothermia protocol -CT chest negative for PE -CXR shows pulmonary edema. Received Lasix - Xray shows still multilobar pneumonia, CT chest done. - Extubated , no distress. - EP physician saw the pt and suggest, need for ICD and transfer to [facility] .   Started on low dose coreg, have allergy to ACE inhibitors.  *Acute renal failure secondary to #1 -making good urine -Creatinine stable  *Febrile illness- multilobar pneumonia -Low-grade fever, tachycardia elevated white count of 40,000--- 19K -Blood cultures so far negative -Empiric IV vancomycin and Zosyn- changed to rocephin -C. Difficile negative -chest x-ray shows diffuse pulmonary infiltrate - Had bronch done 03/07/17- 10 ml cloudy thick secretion suctioned from right Middle lobe. Sent for culture.- negative so far.  - Sputum cx on 8/24 have Pansensitive staph.   Abx given until 03/07/17- stable after that.  *altered mental status- while on ventilator -Patient  had periods of agitation  -Seen by Dr. Doy Mince -- neurology -Recommend EEG and brain MRI. - No acute injuries. Taper sedation slowly. - likely due to sedative meds. - more calm and following commands , extubated  . - Due to prolonged hospitalization and weakness now, may need rehab placement.  *Metabolic acidosis secondary to #1  *Hyperglycemia -Patient currently off insulin  drip  * Nutrition - swallow eval. - started on diet.  *DVT prophylaxis subcutaneous heparin  DISCHARGE CONDITIONS:   Stable.  CONSULTS OBTAINED:  Treatment Team:  Catarina Hartshorn, MD Alexis Goodell, MD Jonathon Bellows, MD  DRUG ALLERGIES:   Allergies   Allergen Reactions  . Ace Inhibitors   . Compazine [Prochlorperazine Edisylate]   . Lisinopril   . Penicillins     Has patient had a PCN reaction causing immediate rash, facial/tongue/throat swelling, SOB or lightheadedness with hypotension: Unknown Has patient had a PCN reaction causing severe rash involving mucus membranes or skin necrosis: Unknown Has patient had a PCN reaction that required hospitalization: Unknown Has patient had a PCN reaction occurring within the last 10 years: Unknown If all of the above answers are "NO", then may proceed with Cephalosporin use.     DISCHARGE MEDICATIONS:   Current Discharge Medication List    START taking these medications   Details  carvedilol (COREG) 3.125 MG tablet Take 1 tablet (3.125 mg total) by mouth 2 (two) times daily with a meal. Qty: 30 tablet, Refills: 0    ipratropium-albuterol (DUONEB) 0.5-2.5 (3) MG/3ML SOLN Take 3 mLs by nebulization every 6 (six) hours. Qty: 360 mL, Refills: 0    levalbuterol (XOPENEX) 1.25 MG/0.5ML nebulizer solution Take 1.25 mg by nebulization every 6 (six) hours as needed for wheezing or shortness of breath. Qty: 1 each, Refills: 12    lipase/protease/amylase (CREON) 36000 UNITS CPEP capsule Take 2 capsules (72,000 Units total) by mouth 3 (three) times daily before meals. Qty: 10 capsule, Refills: 0    QUEtiapine (SEROQUEL) 25 MG tablet Take 1 tablet (25 mg total) by mouth at bedtime. Qty: 20 tablet, Refills: 0      CONTINUE these medications which have NOT CHANGED   Details  acetaminophen (TYLENOL) 500 MG tablet Take 1,000 mg by mouth every 6 (six) hours as needed.      STOP taking these medications     amLODipine (NORVASC) 10 MG tablet      hydrochlorothiazide (HYDRODIURIL) 25 MG tablet      ibuprofen (ADVIL,MOTRIN) 200 MG tablet      olmesartan (BENICAR) 40 MG tablet      ondansetron (ZOFRAN-ODT) 4 MG disintegrating tablet      Olmesartan-Amlodipine-HCTZ 40-10-25 MG TABS       potassium chloride (K-DUR) 10 MEQ tablet          DISCHARGE INSTRUCTIONS:    Follow instructions of cardiologist from [facility]  If you experience worsening of your admission symptoms, develop shortness of breath, life threatening emergency, suicidal or homicidal thoughts you must seek medical attention immediately by calling 911 or calling your MD immediately  if symptoms less severe.  You Must read complete instructions/literature along with all the possible adverse reactions/side effects for all the Medicines you take and that have been prescribed to you. Take any new Medicines after you have completely understood and accept all the possible adverse reactions/side effects.   Please note  You were cared for by a hospitalist during your hospital stay. If you have any questions about your discharge medications or the care you received while you were in the hospital after you are discharged, you can call the unit and asked to speak with the hospitalist on call if the hospitalist that took care of you is not available. Once you are discharged, your primary care physician will handle any further medical issues. Please note that NO REFILLS for any discharge medications will be authorized  once you are discharged, as it is imperative that you return to your primary care physician (or establish a relationship with a primary care physician if you do not have one) for your aftercare needs so that they can reassess your need for medications and monitor your lab values.    Today   CHIEF COMPLAINT:   Chief Complaint  Patient presents with  . Cardiac Arrest    HISTORY OF PRESENT ILLNESS:  Josalin Carneiro  is a 56 y.o. female with a known history of Hypertension, migraines presented to the emergency room for cardiac arrest. Patient works at [employer] at [employer]. Patient currently on ventilator and unable to give any history. History of pain from patient's husband was at bedside.  Patient had diarrhea for the last couple of days and was taking almost 12 Imodium tablets every day. Yesterday around 9:30 PM she was video chatting with her daughter and around 9:45 PM she was sitting on the toilet, old and became unresponsive. Patient's husband did CPR and EMS arrived and patient was found to be in V. Fib. Patient was shocked twice by the EMS. Patient was intubated and put on ventilator and brought to the emergency room. Workup was done in the emergency room which showed low potassium level. Patient was seen by cardiology immediately in the emergency room and was taken to cardiac catheter lab and underwent cardiac catheterization. No blockage was found but the EF was found to be 15%. Intensivist team was consulted. Central line was placed by intensivist team. Hypothermia protocol was started. Her WBC count was elevated and she was started on IV cefepime antibiotic. Hospitalist service was also consulted.   VITAL SIGNS:  Blood pressure 140/64, pulse 81, temperature 98.9 F (37.2 C), temperature source Oral, resp. rate 18, height _0  (1.575 m), weight 82.6 kg (182 lb 3.2 oz), SpO2 97 %.  I/O:   Intake/Output Summary (Last 24 hours) at 03/14/17 1131 Last data filed at 03/14/17 0900  Gross per 24 hour  Intake              240 ml  Output              350 ml  Net             -110 ml    PHYSICAL EXAMINATION:   GENERAL:  56 y.o.-year-old patient lying in the bed with no acute distress.  EYES: Pupils equal, round, reactive to light . No scleral icterus. Extraocular muscles intact.  HEENT: Head atraumatic, normocephalic. Oropharynx and nasopharynx clear. Intubated NECK:  Supple, no jugular venous distention. No thyroid enlargement, no tenderness, NG tube.  LUNGS: Normal breath sounds bilaterally, no wheezing, rales, rhonchi. No use of accessory muscles of respiration.  CARDIOVASCULAR: S1, S2 normal. No murmurs, rubs, or gallops.  ABDOMEN: Soft, nontender, nondistended. Bowel  sounds feeble. No organomegaly or mass.  EXTREMITIES: No cyanosis, clubbing or edema b/l.    NEUROLOGIC: Moves limbs , follow commands. Sitting in a chair, power 4/5 all limbs, PSYCHIATRIC:   calm and follows simple commands. No distress. SKIN: No obvious rash, lesion, or ulcer.   DATA REVIEW:   CBC  Recent Labs Lab 03/14/17 0420  WBC 7.5  HGB 10.4*  HCT 30.3*  PLT 326    Chemistries   Recent Labs Lab 03/14/17 0420  NA 142  K 4.4  CL 111  CO2 25  GLUCOSE 113*  BUN 25*  CREATININE 0.50  CALCIUM 8.8*  MG 2.1  Cardiac Enzymes No results for input(s): TROPONINI in the last 168 hours.  Microbiology Results  Results for orders placed or performed during the hospital encounter of 02/25/17  MRSA PCR Screening     Status: None   Collection Time: 02/26/17  1:36 AM  Result Value Ref Range Status   MRSA by PCR NEGATIVE NEGATIVE Final    Comment:        The GeneXpert MRSA Assay (FDA approved for NASAL specimens only), is one component of a comprehensive MRSA colonization surveillance program. It is not intended to diagnose MRSA infection nor to guide or monitor treatment for MRSA infections.   Blood culture (routine x 2)     Status: None   Collection Time: 02/26/17  2:08 AM  Result Value Ref Range Status   Specimen Description BLOOD LEFT WRIST  Final   Special Requests   Final    BOTTLES DRAWN AEROBIC AND ANAEROBIC Blood Culture adequate volume   Culture NO GROWTH 5 DAYS  Final   Report Status 03/03/2017 FINAL  Final  Blood culture (routine x 2)     Status: None   Collection Time: 02/26/17  3:40 AM  Result Value Ref Range Status   Specimen Description BLOOD PORT  Final   Special Requests   Final    BOTTLES DRAWN AEROBIC AND ANAEROBIC Blood Culture adequate volume   Culture NO GROWTH 5 DAYS  Final   Report Status 03/03/2017 FINAL  Final  Urine Culture     Status: None   Collection Time: 02/28/17 10:18 AM  Result Value Ref Range Status   Specimen  Description URINE, CATHETERIZED  Final   Special Requests Normal  Final   Culture   Final    NO GROWTH Performed at [facility], [place] [address], [place], [place] [address]    Report Status 03/01/2017 FINAL  Final  Culture, respiratory (NON-Expectorated)     Status: None   Collection Time: 02/28/17 10:45 AM  Result Value Ref Range Status   Specimen Description TRACHEAL ASPIRATE  Final   Special Requests NONE  Final   Gram Stain   Final    ABUNDANT WBC PRESENT,BOTH PMN AND MONONUCLEAR FEW SQUAMOUS EPITHELIAL CELLS PRESENT MODERATE GRAM POSITIVE COCCI FEW GRAM NEGATIVE RODS RARE GRAM POSITIVE RODS RARE GRAM NEGATIVE COCCI Performed at [facility], [place] [address], [place], [place] [address]    Culture ABUNDANT STAPHYLOCOCCUS AUREUS  Final   Report Status 03/02/2017 FINAL  Final   Organism ID, Bacteria STAPHYLOCOCCUS AUREUS  Final      Susceptibility   Staphylococcus aureus - MIC*    CIPROFLOXACIN <=0.5 SENSITIVE Sensitive     ERYTHROMYCIN <=0.25 SENSITIVE Sensitive     GENTAMICIN <=0.5 SENSITIVE Sensitive     OXACILLIN <=0.25 SENSITIVE Sensitive     TETRACYCLINE <=1 SENSITIVE Sensitive     VANCOMYCIN <=0.5 SENSITIVE Sensitive     TRIMETH/SULFA <=10 SENSITIVE Sensitive     CLINDAMYCIN <=0.25 SENSITIVE Sensitive     RIFAMPIN <=0.5 SENSITIVE Sensitive     Inducible Clindamycin NEGATIVE Sensitive     * ABUNDANT STAPHYLOCOCCUS AUREUS  C difficile quick scan w PCR reflex     Status: None   Collection Time: 03/04/17 11:40 AM  Result Value Ref Range Status   C Diff antigen NEGATIVE NEGATIVE Final   C Diff toxin NEGATIVE NEGATIVE Final   C Diff interpretation No C. difficile detected.  Final  Culture, bal-quantitative     Status: None   Collection  Time: 03/07/17 12:37 PM  Result Value Ref Range Status   Specimen Description TRACHEAL ASPIRATE  Final   Special Requests Normal  Final   Gram Stain   Final    ABUNDANT WBC PRESENT, PREDOMINANTLY PMN NO ORGANISMS SEEN     Culture   Final    NO GROWTH 2 DAYS Performed at [facility], [address] [place], [place] [address]    Report Status 03/09/2017 FINAL  Final  Gastrointestinal Panel by PCR , Stool     Status: None   Collection Time: 03/13/17  8:10 AM  Result Value Ref Range Status   Campylobacter species NOT DETECTED NOT DETECTED Final   Plesimonas shigelloides NOT DETECTED NOT DETECTED Final   Salmonella species NOT DETECTED NOT DETECTED Final   Yersinia enterocolitica NOT DETECTED NOT DETECTED Final   Vibrio species NOT DETECTED NOT DETECTED Final   Vibrio cholerae NOT DETECTED NOT DETECTED Final   Enteroaggregative E coli (EAEC) NOT DETECTED NOT DETECTED Final   Enteropathogenic E coli (EPEC) NOT DETECTED NOT DETECTED Final   Enterotoxigenic E coli (ETEC) NOT DETECTED NOT DETECTED Final   Shiga like toxin producing E coli (STEC) NOT DETECTED NOT DETECTED Final   Shigella/Enteroinvasive E coli (EIEC) NOT DETECTED NOT DETECTED Final   Cryptosporidium NOT DETECTED NOT DETECTED Final   Cyclospora cayetanensis NOT DETECTED NOT DETECTED Final   Entamoeba histolytica NOT DETECTED NOT DETECTED Final   Giardia lamblia NOT DETECTED NOT DETECTED Final   Adenovirus F40/41 NOT DETECTED NOT DETECTED Final   Astrovirus NOT DETECTED NOT DETECTED Final   Norovirus GI/GII NOT DETECTED NOT DETECTED Final   Rotavirus A NOT DETECTED NOT DETECTED Final   Sapovirus (I, II, IV, and V) NOT DETECTED NOT DETECTED Final    RADIOLOGY:  No results found.  EKG:   Orders placed or performed during the hospital encounter of 02/25/17  . EKG 12-Lead  . EKG 12-Lead  . EKG 12-Lead  . EKG 12-Lead  . EKG 12-Lead  . EKG 12-Lead  . EKG 12-Lead  . EKG 12-Lead  . EKG 12-Lead  . EKG 12-Lead  . EKG 12-Lead      Management plans discussed with the patient, family and they are in agreement.  CODE STATUS:  Code Status History    Date Active Date Inactive Code Status Order ID Comments User Context   03/06/2017  11:17 AM 03/13/2017 12:27 PM DNR [id number]  Flora Lipps, MD Inpatient   02/26/2017  2:05 AM 03/06/2017 11:17 AM Full Code [id number]  Holley Raring, NP Inpatient   02/26/2017  1:27 AM 02/26/2017  2:05 AM Full Code [id number]  End, Harrell Gave, MD Inpatient    Questions for Most Recent Historical Code Status (Order [id number])    Question Answer Comment   In the event of cardiac or respiratory ARREST Do not call a "code blue"    In the event of cardiac or respiratory ARREST Do not perform Intubation, CPR, defibrillation or ACLS    In the event of cardiac or respiratory ARREST Use medication by any route, position, wound care, and other measures to relive pain and suffering. May use oxygen, suction and manual treatment of airway obstruction as needed for comfort.       TOTAL TIME TAKING CARE OF THIS PATIENT: 35 minutes.    Vaughan Basta M.D on 03/14/2017 at 11:31 AM  Between 7am to 6pm - Pager - [phone]  After 6pm go to [email or web address] - [place]  [facility]  Office  [phone]  CC: Primary care physician; Miranda Mcardle, MD   Note: This dictation was prepared with Dragon dictation along with smaller phrase technology. Any transcriptional errors that result from this process are unintentional.

## 2017-03-14 NOTE — Progress Notes (Signed)
[facility] [place] at [facility]   PATIENT NAME: Miranda [name]    MR#:  [id number]  DATE OF BIRTH:  [birth year]  SUBJECTIVE:   After cardiac arrest was on the vent .  tapered sedation,  More calm and follows simple commands   Finally extubated , talking in low voice, have cough and diarrhea, but very happy.  REVIEW OF SYSTEMS:   Review of Systems  Constitutional: Negative for chills, fever and weight loss.  HENT: Negative for congestion, ear pain, hearing loss and sore throat.   Eyes: Negative for blurred vision, double vision and pain.  Respiratory: Negative for cough, sputum production and shortness of breath.   Cardiovascular: Negative for chest pain, orthopnea and leg swelling.  Gastrointestinal: Negative for constipation, nausea and vomiting.  Genitourinary: Negative for flank pain, frequency and urgency.  Musculoskeletal: Negative for joint pain and neck pain.  Skin: Negative for rash.  Neurological: Negative for dizziness, tingling, tremors, sensory change and focal weakness.  Psychiatric/Behavioral: Negative for depression, memory loss and substance abuse. The patient does not have insomnia.    DRUG ALLERGIES:   Allergies  Allergen Reactions  . Ace Inhibitors   . Compazine [Prochlorperazine Edisylate]   . Lisinopril   . Penicillins     Has patient had a PCN reaction causing immediate rash, facial/tongue/throat swelling, SOB or lightheadedness with hypotension: Unknown Has patient had a PCN reaction causing severe rash involving mucus membranes or skin necrosis: Unknown Has patient had a PCN reaction that required hospitalization: Unknown Has patient had a PCN reaction occurring within the last 10 years: Unknown If all of the above answers are "NO", then may proceed with Cephalosporin use.     VITALS:  Blood pressure 140/64, pulse 81, temperature 98.9 F (37.2 C), temperature source Oral, resp. rate 18, height  (1.575 m), weight 82.6  kg (182 lb 3.2 oz), SpO2 98 %.  PHYSICAL EXAMINATION:   Physical Exam  GENERAL:  56 y.o.-year-old patient lying in the bed with no acute distress.  EYES: Pupils equal, round, reactive to light . No scleral icterus. Extraocular muscles intact.  HEENT: Head atraumatic, normocephalic. Oropharynx and nasopharynx clear. Intubated NECK:  Supple, no jugular venous distention. No thyroid enlargement, no tenderness, NG tube.  LUNGS: Normal breath sounds bilaterally, no wheezing, rales, rhonchi. No use of accessory muscles of respiration.  CARDIOVASCULAR: S1, S2 normal. No murmurs, rubs, or gallops.  ABDOMEN: Soft, nontender, nondistended. Bowel sounds feeble. No organomegaly or mass.  EXTREMITIES: No cyanosis, clubbing or edema b/l.    NEUROLOGIC: Moves limbs , follow commands. Sitting in a chair, power 4/5 all limbs, PSYCHIATRIC:   Extubated now,  calm and follows simple commands. SKIN: No obvious rash, lesion, or ulcer.   LABORATORY PANEL:  CBC  Recent Labs Lab 03/14/17 0420  WBC 7.5  HGB 10.4*  HCT 30.3*  PLT 326    Chemistries   Recent Labs Lab 03/14/17 0420  NA 142  K 4.4  CL 111  CO2 25  GLUCOSE 113*  BUN 25*  CREATININE 0.50  CALCIUM 8.8*  MG 2.1   Cardiac Enzymes No results for input(s): TROPONINI in the last 168 hours. RADIOLOGY:  No results found. ASSESSMENT AND PLAN:   Ms. Klahr reportedly had diarrhea over the last 2-3 days for which she was taking up to 12 Imodium per day. She was trying to stay well hydrated and had also stopped taking her blood pressure medications on account of her GI illness. Around  9 PM, Ms. Deere's husband found the patient seated on the toilet. She attempted to stand up but passed out.  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 Severely reduced LVEF less than <20%  -repeat echo showed EF of 60-65% -Patient  was on IV levophed,Milrinone- improved now. - Was IV propofol -Patient was  on hypothermia protocol -CT chest negative for PE -CXR shows pulmonary edema. Received Lasix - Xray shows still multilobar pneumonia, CT chest done. - Extubated , no distress. - EP physician saw the pt and suggest, need for ICD and transfer to [facility] tomorrow..  *Acute renal failure secondary to #1 -making good urine -Creatinine stable  *Febrile illness- multilobar pneumonia -Low-grade fever, tachycardia elevated white count of 40,000--- 19K -Blood cultures so far negative -Empiric IV vancomycin and Zosyn- changed to rocephin -C. Difficile negative -chest x-ray shows diffuse pulmonary infiltrate - Had bronch done 03/07/17- 10 ml cloudy thick secretion suctioned from right Middle lobe. Sent for culture.- negative so far.  - Sputum cx on 8/24 have Pansensitive staph.   Abx given until 03/07/17- stable after that.  *altered mental status- while on ventilator -Patient  had periods of agitation  -Seen by Dr. Thad Ranger -- neurology -Recommend EEG and brain MRI. - No acute injuries. Taper sedation slowly. - likely due to sedative meds. - more calm and following commands , extubated  . - Due to prolonged hospitalization and weakness now, may need rehab placement.  *Metabolic acidosis secondary to #1  *Hyperglycemia -Patient currently off insulin drip  * Nutrition - swallow eval. - started on diet.  *DVT prophylaxis subcutaneous heparin  Now extubated, may need rehab.  Case discussed with Care Management/Social Worker. Management plans discussed with the  family and they are in agreement.  CODE STATUS: Full  DVT Prophylaxis: Subcutaneous heparin  TOTAL CRITICAL TIME TAKING CARE OF THIS PATIENT: 30 minutes.  >50% time spent on counselling and coordination of care   Note: This dictation was prepared with Dragon dictation along with smaller phrase technology. Any transcriptional errors  that result from this process are unintentional.  Altamese Dilling M.D on 03/14/2017 at 9:26 AM  Between 7am to 6pm - Pager - [phone]  After 6pm go to [email or web address] - Social research officer, government  [facility]  Office  [phone]  CC: Primary care physician; Merwyn Katos, MD

## 2017-03-14 NOTE — Care Management (Addendum)
confirmed that patient is waiting on a bed at [facility].  [facility] is aware. Updated charge nurse and patient

## 2017-03-15 ENCOUNTER — Other Ambulatory Visit: Payer: Self-pay

## 2017-03-15 ENCOUNTER — Encounter (HOSPITAL_COMMUNITY): Payer: Self-pay

## 2017-03-15 DIAGNOSIS — R5381 Other malaise: Secondary | ICD-10-CM

## 2017-03-15 DIAGNOSIS — R531 Weakness: Secondary | ICD-10-CM

## 2017-03-15 DIAGNOSIS — I4901 Ventricular fibrillation: Principal | ICD-10-CM

## 2017-03-15 DIAGNOSIS — I469 Cardiac arrest, cause unspecified: Secondary | ICD-10-CM

## 2017-03-15 DIAGNOSIS — I10 Essential (primary) hypertension: Secondary | ICD-10-CM

## 2017-03-15 LAB — HEMOGLOBIN A1C
Hgb A1c MFr Bld: 7.6 % — ABNORMAL HIGH (ref 4.8–5.6)
Mean Plasma Glucose: 171.42 mg/dL

## 2017-03-15 LAB — CBC
HCT: 30.4 % — ABNORMAL LOW (ref 36.0–46.0)
HEMATOCRIT: 30.4 % — AB (ref 36.0–46.0)
HEMOGLOBIN: 9.8 g/dL — AB (ref 12.0–15.0)
Hemoglobin: 9.6 g/dL — ABNORMAL LOW (ref 12.0–15.0)
MCH: 29.5 pg (ref 26.0–34.0)
MCH: 30.2 pg (ref 26.0–34.0)
MCHC: 32.2 g/dL (ref 30.0–36.0)
MCHC: 31.6 g/dL (ref 30.0–36.0)
MCV: 93.5 fL (ref 78.0–100.0)
MCV: 93.8 fL (ref 78.0–100.0)
PLATELETS: 282 10*3/uL (ref 150–400)
Platelets: 184 10*3/uL (ref 150–400)
RBC: 3.24 MIL/uL — ABNORMAL LOW (ref 3.87–5.11)
RBC: 3.25 MIL/uL — AB (ref 3.87–5.11)
RDW: 14.8 % (ref 11.5–15.5)
RDW: 15.2 % (ref 11.5–15.5)
WBC: 5.2 10*3/uL (ref 4.0–10.5)
WBC: 6.5 10*3/uL (ref 4.0–10.5)

## 2017-03-15 LAB — COMPREHENSIVE METABOLIC PANEL
ALK PHOS: 72 U/L (ref 38–126)
ALT: 67 U/L — AB (ref 14–54)
AST: 37 U/L (ref 15–41)
Albumin: 2.8 g/dL — ABNORMAL LOW (ref 3.5–5.0)
Anion gap: 9 (ref 5–15)
BUN: 22 mg/dL — ABNORMAL HIGH (ref 6–20)
CALCIUM: 8.5 mg/dL — AB (ref 8.9–10.3)
CHLORIDE: 107 mmol/L (ref 101–111)
CO2: 23 mmol/L (ref 22–32)
CREATININE: 0.55 mg/dL (ref 0.44–1.00)
Glucose, Bld: 106 mg/dL — ABNORMAL HIGH (ref 65–99)
Potassium: 3.7 mmol/L (ref 3.5–5.1)
Sodium: 139 mmol/L (ref 135–145)
Total Bilirubin: 1.1 mg/dL (ref 0.3–1.2)
Total Protein: 6.1 g/dL — ABNORMAL LOW (ref 6.5–8.1)

## 2017-03-15 LAB — PROTIME-INR
INR: 1.16
Prothrombin Time: 14.7 seconds (ref 11.4–15.2)

## 2017-03-15 LAB — TROPONIN I
TROPONIN I: 0.08 ng/mL — AB (ref <0.03)
TROPONIN I: 0.08 ng/mL — AB (ref <0.03)
Troponin I: 0.09 ng/mL (ref <0.03)

## 2017-03-15 LAB — CREATININE, SERUM
Creatinine, Ser: 0.56 mg/dL (ref 0.44–1.00)
GFR calc Af Amer: >60 mL/min (ref >60)
GFR calc non Af Amer: >60 mL/min (ref >60)

## 2017-03-15 LAB — TSH: TSH: 1.637 u[IU]/mL (ref 0.350–4.500)

## 2017-03-15 LAB — GLUCOSE, CAPILLARY: Glucose-Capillary: 103 mg/dL — ABNORMAL HIGH (ref 65–99)

## 2017-03-15 LAB — BRAIN NATRIURETIC PEPTIDE: B NATRIURETIC PEPTIDE 5: 116.2 pg/mL — AB (ref 0.0–100.0)

## 2017-03-15 MED ORDER — IPRATROPIUM-ALBUTEROL 0.5-2.5 (3) MG/3ML IN SOLN: 3.0000 mL | Freq: Four times a day (QID) | RESPIRATORY_TRACT | Status: DC | PRN

## 2017-03-15 MED ORDER — SODIUM CHLORIDE 0.9% FLUSH
10.0000 mL | INTRAVENOUS | Status: DC | PRN
  Administered 2017-03-15 – 2017-03-16 (×3): 10 mL
  Administered 2017-03-16: 20 mL
  Administered 2017-03-16 – 2017-03-17 (×2): 10 mL
  Filled 2017-03-15 (×6): qty 40

## 2017-03-15 NOTE — H&P (Signed)
CARDIOLOGY    Inpatient history and physical examination  Patient ID: Miranda [name] MRN: [id number], DOB/AGE: 1961-02-12   Admit date: 03/14/2017   Primary Physician: Merwyn Katos, MD Primary Cardiologist: Yvonne Kendall MD  Reason for admission: VF arrest  HPI: This is a 56 y.o. female with h/o HTN who presented to [facility] on 08/21 with VF arrest and underwent urgent LHC that showed nonobstructive CAD.  Prior to presentation patient had watery diarrhea x 3 d and patient was unable to stay hydrated, Her K remained low on arrival and patient had VF arrest which was thought to be due to hypokalemia.  Initial w/u was -ve for PE however showed b/l airspace dz treated with abx and steroids. Troponin was borderline elevated however initial echo showed severely reduced LVEF of < 25% and moderately elevated LVEDP at 28 mmHg. Echo showed an EF of 20-25%, diffuse HK, unable to exclude RWMA, mildly reduced RV systolic function. Hypothermia protocol was completed. She initially required norepinephrine, amiodarone, phenylephrine and epi gtts to maintian MAP > 65. During rewarming she was bradycardic leading to the holding of amiodarone. Pressors were weaned 8/23. She was placed on milrione infusion with significant improvement. She failed extubation on 8/25 with difficult re-intubation. MRI brain showed no acute process. EEG showed encephalopathic pattern. Repeat echo on 8/27 showed significantly improved EF to 55-60%, no RWMA, mild AI, PASP 41 mmHg. Milrinone was weaned. She underwent successful extubation on 9/4 and has remained on room air. Labs have significantly improved with AKI resolved and electrolytes at goal. She was seen by GI given diarrhea with recommendation for stool studies (pending) and outpatient EGD/colonoscopy. CXR showed multilobar PNA with bronchoscopy on 8/31 showing thick secretions with cultures negative. Empiric ABX stopped on 8/31. She was seen by EP on 9/6 with recommendation to transfer  to [facility] for possible cardiac MRI followed by ICD implantation. She has ambulated with PR, continues to note generalized weakness. Sore throat from intubation.   Problem List: Past Medical History:  Diagnosis Date  . Chronic systolic CHF (congestive heart failure) (HCC)    a. TTE 02/26/2017: EF < 20%, diffuse HK, mild AI, RV sys fxn mildly reduced; b. TTE 03/03/2017: EF 55-60%, nl WM, mild AI, PASP 46  . Coronary artery disease, non-occlusive    a. LHC 02/25/2017: no angiographically significant CAD, EF <35%, moderately elevated LVEDP at 28 mmHg  . Dependent edema    bilateral legs  . Hot flashes   . HTN (hypertension)   . Migraines   . Nausea   . Ventricular fibrillation (HCC)    a. 02/25/2017: LHC without signficant CAD; b. idiopathic; c. possibly 2/2 diarrhea with electrolyte abnormalities with Imodium usage    Past Surgical History:  Procedure Laterality Date  . APPENDECTOMY  1974  . CHOLECYSTECTOMY  1984  . LEFT HEART CATH AND CORONARY ANGIOGRAPHY N/A 02/26/2017   Procedure: LEFT HEART CATH AND CORONARY ANGIOGRAPHY;  Surgeon: Yvonne Kendall, MD;  Location: [facility] INVASIVE CV LAB;  Service: Cardiovascular;  Laterality: N/A;  . TONSILLECTOMY  1964     Allergies:  Allergies  Allergen Reactions  . Ace Inhibitors   . Compazine [Prochlorperazine Edisylate]   . Lisinopril   . Penicillins     Has patient had a PCN reaction causing immediate rash, facial/tongue/throat swelling, SOB or lightheadedness with hypotension: Unknown Has patient had a PCN reaction causing severe rash involving mucus membranes or skin necrosis: Unknown Has patient had a PCN reaction that required hospitalization: Unknown Has patient had  a PCN reaction occurring within the last 10 years: Unknown If all of the above answers are "NO", then may proceed with Cephalosporin use.      Home Medications No current facility-administered medications for this encounter.      Family History  Problem Relation Age of  Onset  . Lymphoma Mother   . Heart disease Father   . Stroke Father   . Hyperlipidemia Father   . Hypertension Father   . Hypertension Sister   . Migraines Sister   . Hypertension Brother      Social History   Social History  . Marital status: Married    Spouse name: N/A  . Number of children: N/A  . Years of education: N/A   Occupational History  . Not on file.   Social History Main Topics  . Smoking status: Never Smoker  . Smokeless tobacco: Never Used  . Alcohol use No  . Drug use: No  . Sexual activity: Not on file   Other Topics Concern  . Not on file   Social History Narrative  . No narrative on file     Review of Systems: General: negative for chills, fever, night sweats or weight changes.  Cardiovascular: fatigue DOE Dermatological: negative for rash Respiratory: sore throat and hoarse voice negative for cough or wheezing Urologic: negative for hematuria Abdominal: negative for nausea, vomiting, diarrhea, bright red blood per rectum, melena, or hematemesis Neurologic: negative for visual changes, syncope, or dizziness Endocrine: no diabetes, no hypothyroidism Immunological: no lymph adenopathy Psych: non homicidal/suicidal  Physical Exam: Vitals: BP (!) 145/69 (BP Location: Left Arm)   Pulse 63   Temp 99 F (37.2 C) (Oral)   Resp 18   Ht 5' (1.524 m)   Wt 82.4 kg (181 lb 11.2 oz)   SpO2 100%   BMI 35.49 kg/m  General: not in acute distress Neck: JVP flat, neck supple Heart: regular rate and rhythm, S1, S2, no murmurs  Lungs: CTAB  GI: non tender, non distended, bowel sounds present Extremities: no edema Neuro: AAO x 3 hoarse voice, 5/5 strength bilaterally upper and lower extremities Psych: normal affect, no anxiety   Labs:   Results for orders placed or performed during the hospital encounter of 02/25/17 (from the past 24 hour(s))  Glucose, capillary     Status: Abnormal   Collection Time: 03/14/17  2:20 AM  Result Value Ref Range    Glucose-Capillary 116 (H) 65 - 99 mg/dL  Magnesium     Status: None   Collection Time: 03/14/17  4:20 AM  Result Value Ref Range   Magnesium 2.1 1.7 - 2.4 mg/dL  CBC     Status: Abnormal   Collection Time: 03/14/17  4:20 AM  Result Value Ref Range   WBC 7.5 3.6 - 11.0 K/uL   RBC 3.35 (L) 3.80 - 5.20 MIL/uL   Hemoglobin 10.4 (L) 12.0 - 16.0 g/dL   HCT 16.1 (L) 09.6 - 04.5 %   MCV 90.4 80.0 - 100.0 fL   MCH 31.0 26.0 - 34.0 pg   MCHC 34.3 32.0 - 36.0 g/dL   RDW 40.9 81.1 - 91.4 %   Platelets 326 150 - 440 K/uL  Basic metabolic panel     Status: Abnormal   Collection Time: 03/14/17  4:20 AM  Result Value Ref Range   Sodium 142 135 - 145 mmol/L   Potassium 4.4 3.5 - 5.1 mmol/L   Chloride 111 101 - 111 mmol/L   CO2 25 22 -  32 mmol/L   Glucose, Bld 113 (H) 65 - 99 mg/dL   BUN 25 (H) 6 - 20 mg/dL   Creatinine, Ser 1.61 0.44 - 1.00 mg/dL   Calcium 8.8 (L) 8.9 - 10.3 mg/dL   GFR calc non Af Amer >60 >60 mL/min   GFR calc Af Amer >60 >60 mL/min   Anion gap 6 5 - 15  Glucose, capillary     Status: None   Collection Time: 03/14/17  6:19 AM  Result Value Ref Range   Glucose-Capillary 97 65 - 99 mg/dL  Glucose, capillary     Status: Abnormal   Collection Time: 03/14/17  7:19 AM  Result Value Ref Range   Glucose-Capillary 104 (H) 65 - 99 mg/dL  Glucose, capillary     Status: Abnormal   Collection Time: 03/14/17 11:12 AM  Result Value Ref Range   Glucose-Capillary 132 (H) 65 - 99 mg/dL  Glucose, capillary     Status: None   Collection Time: 03/14/17  4:11 PM  Result Value Ref Range   Glucose-Capillary 95 65 - 99 mg/dL  Glucose, capillary     Status: None   Collection Time: 03/14/17  9:03 PM  Result Value Ref Range   Glucose-Capillary 96 65 - 99 mg/dL     Radiology/Studies: Dg Chest 1 View  Result Date: 02/26/2017 CLINICAL DATA:  Central line placement EXAM: CHEST 1 VIEW COMPARISON:  02/25/2017 CXR and CT FINDINGS: New left IJ approach central line catheter tip is seen at the  cavoatrial junction. No pneumothorax. There is cardiomegaly without aortic aneurysm. Diffuse airspace opacities consistent with pulmonary edema is noted. Superimposed pneumonia would be difficult to entirely exclude. Satisfactory endotracheal and gastric tube positions with endotracheal tube tip 5.3 cm above the carina and gastric tube extending into the expected location the stomach. Cholecystectomy clips are seen in the right upper quadrant. IMPRESSION: 1. Satisfactory support line and tube positions. 2. New left IJ approach central venous line is noted with tip at the cavoatrial junction. No pneumothorax. 3. Cardiomegaly with diffuse bilateral airspace opacities suspicious for pulmonary edema. Superimposed pneumonia would be difficult to entirely exclude. Electronically Signed   By: Tollie Eth M.D.   On: 02/26/2017 03:28   Dg Abd 1 View  Result Date: 03/07/2017 CLINICAL DATA:  NG tube placement. EXAM: ABDOMEN - 1 VIEW COMPARISON:  March 01, 2017. FINDINGS: Interval removal of previously seen nasogastric tube. Minimally prominent air-filled loops of small bowel. Cholecystectomy. IMPRESSION: Interval removal of enteric tube. Minimally prominent air-filled loops of small bowel may reflect ileus. Electronically Signed   By: Obie Dredge M.D.   On: 03/07/2017 12:07   Dg Abd 1 View  Result Date: 03/01/2017 CLINICAL DATA:  NG tube placement EXAM: ABDOMEN - 1 VIEW COMPARISON:  02/26/2017 FINDINGS: Enteric tube terminates in the distal gastric body. No evidence of small bowel obstruction. Mild gaseous distention of a loop of colon. IMPRESSION: Enteric tube terminates in the distal gastric body. Electronically Signed   By: Charline Bills M.D.   On: 03/01/2017 15:02   Ct Head Wo Contrast  Result Date: 02/25/2017 CLINICAL DATA:  Focal neuro deficit, greater than 6 hours. Stroke suspected. Unresponsive, post CPR. EXAM: CT HEAD WITHOUT CONTRAST TECHNIQUE: Contiguous axial images were obtained from the base  of the skull through the vertex without intravenous contrast. COMPARISON:  None. FINDINGS: Brain: No evidence of acute infarction, hemorrhage, hydrocephalus, extra-axial collection or mass lesion/mass effect. Gray-white differentiation is preserved, no cerebral edema. Vascular: Atherosclerosis of skullbase  vasculature without hyperdense vessel or abnormal calcification. Skull: Normal. Negative for fracture or focal lesion. Sinuses/Orbits: Paranasal sinuses and mastoid air cells are clear. The visualized orbits are unremarkable. Other: None. IMPRESSION: No acute intracranial abnormality. Electronically Signed   By: Rubye Oaks M.D.   On: 02/25/2017 23:18   Ct Chest Wo Contrast  Result Date: 03/10/2017 CLINICAL DATA:  Acute respiratory illness EXAM: CT CHEST WITHOUT CONTRAST TECHNIQUE: Multidetector CT imaging of the chest was performed following the standard protocol without IV contrast. COMPARISON:  Radiograph 03/10/2017, CT chest 02/25/2017 FINDINGS: Cardiovascular: Limited evaluation without intravenous contrast. Right upper extremity catheter tip positioned at the cavoatrial junction. Mild atherosclerotic calcification. Minimal coronary artery calcification. Borderline to mild cardiomegaly. Negative for large pericardial effusion. Mediastinum/Nodes: Stable small mediastinal lymph nodes. Largest is seen in the right paratracheal space and measures 9 mm. Endotracheal tube tip terminates just above the carina. Esophageal tube tip visualized to the mid stomach. Lungs/Pleura: Negative for pneumothorax or significant pleural effusion. Scattered bilateral ground-glass densities including a more focused area of consolidation in the peripheral right upper lobe, corresponding to the radiographic abnormality overall the lung aeration has improved since prior CT from 02/25/2017. Upper Abdomen: Surgical clips in the gallbladder fossa. No acute abnormality. Musculoskeletal: Motion artifact causes false appearance of  lower and upper sternal fracture. Acute right fifth anterior rib fracture. IMPRESSION: 1. Mild to moderate scattered bilateral ground-glass densities, favored to represent multifocal pneumonia, overall the aeration of the lung fields is improved compared to the prior chest CT. 2. Endotracheal tube tip is just above the carina 3. Acute to subacute right fifth anterior rib fracture Aortic Atherosclerosis (ICD10-I70.0). Electronically Signed   By: Jasmine Pang M.D.   On: 03/10/2017 14:21   Mr Brain Wo Contrast  Result Date: 03/04/2017 CLINICAL DATA:  Encephalopathy. Recent diarrhea and cardiac arrest. Not following commands. EXAM: MRI HEAD WITHOUT CONTRAST TECHNIQUE: Multiplanar, multiecho pulse sequences of the brain and surrounding structures were obtained without intravenous contrast. COMPARISON:  Head CT from 7 days ago FINDINGS: Brain: No acute infarction, hemorrhage, hydrocephalus, extra-axial collection or mass lesion. No evidence of brain swelling. Few FLAIR hyperintensities in the cerebral white matter, often attributed to chronic microvascular ischemia. No specific demyelinating pattern. Negative for brain atrophy. Vascular: Major flow voids are preserved. Skull and upper cervical spine: Negative for marrow lesion Sinuses/Orbits: Partial bilateral mastoid opacification in the setting of nasopharyngeal and oropharyngeal fluid levels in this intubated patient. IMPRESSION: No acute finding including evidence of anoxic injury. Electronically Signed   By: Marnee Spring M.D.   On: 03/04/2017 15:40   Dg Chest Port 1 View  Result Date: 03/10/2017 CLINICAL DATA:  Acute respiratory failure, ventilatory support EXAM: PORTABLE CHEST 1 VIEW COMPARISON:  03/09/2017 FINDINGS: Endotracheal tube is 3.4 cm above the carina. NG tube within the stomach, tip not visualized. Right PICC line tip mid SVC level. Stable cardiomegaly with vascular congestion and diffuse mixed airspace and interstitial opacities versus edema.  No enlarging effusion or pneumothorax. Right peripheral mid lung nodular opacity remains indeterminate for superimposed shadows or lung nodule. Recommend attention on follow-up exams. IMPRESSION: Stable cardiomegaly with similar diffuse mixed interstitial and airspace opacities, suspicious for infection versus edema. No enlarging effusion or pneumothorax Possible right midlung peripheral lung nodule. Electronically Signed   By: Judie Petit.  Shick M.D.   On: 03/10/2017 09:29   Dg Chest Port 1 View  Result Date: 03/09/2017 CLINICAL DATA:  Respiratory failure.  Intubated. EXAM: PORTABLE CHEST 1 VIEW COMPARISON:  03/09/2015. FINDINGS:  Endotracheal tube in satisfactory position. Nasogastric tube extending into the stomach. Right PICC tip in the inferior aspect of the superior vena cava. Stable enlarged cardiac silhouette and bilateral interstitial opacities. Unremarkable bones. IMPRESSION: Stable cardiomegaly and interstitial lung disease. Electronically Signed   By: Beckie Salts M.D.   On: 03/09/2017 08:30   Dg Chest Port 1 View  Result Date: 03/08/2017 CLINICAL DATA:  Acute respiratory failure.  Intubated. EXAM: PORTABLE CHEST 1 VIEW COMPARISON:  Yesterday. FINDINGS: Endotracheal tube in satisfactory position. Stable enlarged cardiac silhouette. Stable diffuse prominence of the interstitial markings with no definite superimposed airspace opacity at this time. No pleural fluid. Nasogastric tube extending into the stomach. Unremarkable bones. IMPRESSION: Stable cardiomegaly and interstitial lung disease with no definite superimposed alveolar edema or pneumonia at this time. Electronically Signed   By: Beckie Salts M.D.   On: 03/08/2017 18:01   Dg Chest Port 1 View  Result Date: 03/07/2017 CLINICAL DATA:  Evaluate for a nasogastric tube. EXAM: PORTABLE CHEST 1 VIEW COMPARISON:  03/07/2017 FINDINGS: Nasogastric tube has been pulled back and it is now in the upper chest near the thoracic inlet. Endotracheal tube is roughly  4.1 cm above the carina. There continues to be enlarged interstitial and vascular markings in both lungs. Cardiac silhouette is upper limits of normal and stable. Central line tip in the SVC region. Negative for a pneumothorax. IMPRESSION: Nasogastric tube has been pulled back and located in the upper chest as described. Persistent patchy lung densities suggestive for pulmonary edema and/or airspace disease. Support apparatuses as described. Electronically Signed   By: Richarda Overlie M.D.   On: 03/07/2017 12:11   Dg Chest Port 1 View  Result Date: 03/07/2017 CLINICAL DATA:  Acute respiratory failure . EXAM: PORTABLE CHEST 1 VIEW COMPARISON:  03/06/2017 . FINDINGS: Endotracheal tube, NG tube, right PICC line stable position. Stable cardiomegaly. Diffuse bilateral pulmonary interstitial prominence consistent CHF again noted. Slight interval clearing. Small left pleural effusion. No pneumothorax . IMPRESSION: 1. Lines and tubes in stable position. 2. Cardiomegaly with diffuse bilateral from interstitial prominence consistent CHF again noted. Slight interval clearing. Small left pleural effusion. Electronically Signed   By: Maisie Fus  Register   On: 03/07/2017 06:17   Dg Chest Port 1 View  Result Date: 03/06/2017 CLINICAL DATA:  Respiratory failure EXAM: PORTABLE CHEST 1 VIEW COMPARISON:  03/05/2017 FINDINGS: Endotracheal tube and NG tube as well as right PICC line remain in place, unchanged. Diffuse bilateral airspace disease, slightly worsened since prior study. Cardiomegaly. IMPRESSION: Worsening diffuse bilateral airspace disease, likely edema/ CHF. Electronically Signed   By: Charlett Nose M.D.   On: 03/06/2017 07:11   Dg Chest Port 1 View  Result Date: 03/05/2017 CLINICAL DATA:  Post cardiac arrest.  Respiratory failure. EXAM: PORTABLE CHEST 1 VIEW COMPARISON:  03/04/2017 FINDINGS: Endotracheal tube terminates 11 mm above the carina. Retraction by approximately 2 cm is recommended. Enteric catheter tip is  collimated off the image. Right PICC line in stable position with tip overlying the expected position of superior vena cava. Mildly enlarged cardiomediastinal silhouette. No evidence of pneumothorax. Low lung volumes with mixed pattern pulmonary edema. Osseous structures are without acute abnormality. Soft tissues are grossly normal. IMPRESSION: Endotracheal tube terminates 11 mm above the carina. Retraction by approximately 2 cm is recommended. Mixed pattern pulmonary edema. Borderline enlarged cardiomediastinal silhouette. This may be due to portable AP technique, however vascular abnormality cannot be excluded. Please correlate clinically. These results will be called to the ordering clinician or  representative by the Radiologist Assistant, and communication documented in the PACS or zVision Dashboard. Electronically Signed   By: Ted Mcalpine M.D.   On: 03/05/2017 12:37   Dg Chest Port 1 View  Result Date: 03/04/2017 CLINICAL DATA:  Respiratory failure. EXAM: PORTABLE CHEST 1 VIEW COMPARISON:  03/03/2017. FINDINGS: Endotracheal tube, NG tube, right PICC line in stable position. Heart size normal. Diffuse bilateral pulmonary infiltrates/edema again noted. Slight improvement from prior exam. Small left pleural effusion again noted. No pneumothorax. IMPRESSION: 1. Lines and tubes stable position. 2. Diffuse bilateral pulmonary infiltrates/edema again noted. Slight improvement from prior exam. Small left pleural effusion again noted. Electronically Signed   By: Maisie Fus  Register   On: 03/04/2017 06:42   Dg Chest Port 1 View  Result Date: 03/03/2017 CLINICAL DATA:  Respiratory failure. EXAM: PORTABLE CHEST 1 VIEW COMPARISON:  03/02/2017. FINDINGS: Endotracheal tube, NG tube, right PICC line stable position. Heart size stable. Diffuse bilateral pulmonary infiltrates. Small left pleural effusion. No pneumothorax . IMPRESSION: 1.  Lines and tubes in stable position. 2. Diffuse bilateral pulmonary  infiltrates/ edema. Small left pleural effusion. No interim change. Electronically Signed   By: Maisie Fus  Register   On: 03/03/2017 06:37   Dg Chest Port 1 View  Result Date: 03/02/2017 CLINICAL DATA:  Acute respiratory failure. On ventilator. Cardiac arrest with ventricular fibrillation. EXAM: PORTABLE CHEST 1 VIEW COMPARISON:  03/01/2017 FINDINGS: Endotracheal tube remains in appropriate position. New nasogastric tube is seen entering the stomach. New right arm PICC line is seen with tip in the region of the cavoatrial junction. Stable mild cardiomegaly. Previously seen pneumomediastinum no longer visualized. No evidence of pneumothorax. Mild diffuse bilateral pulmonary airspace disease shows no significant interval change. IMPRESSION: Stable cardiomegaly and diffuse bilateral airspace disease/ edema. Interval resolution of pneumomediastinum since previous study. No evidence of pneumothorax. Electronically Signed   By: Myles Rosenthal M.D.   On: 03/02/2017 09:38   Dg Chest Port 1 View  Addendum Date: 03/01/2017   ADDENDUM REPORT: 03/01/2017 06:41 ADDENDUM: Study discussed by telephone with NP Dinzy on 03/01/2017 at 0635 hours. She advised the patient had been extubated and was re-intubated at 0430 hours today, and that the re-intubation was traumatic. Therefore, favor that trauma as the origin of the pneumomediastinum and soft tissue gas. I advised that if this gas accumulations seem to be progressing on clinical exam or subsequent radiographs then further evaluation of the extent of trauma would be recommended, such as with Chest CT (IV contrast preferred). Electronically Signed   By: Odessa Fleming M.D.   On: 03/01/2017 06:41   Result Date: 03/01/2017 CLINICAL DATA:  56 year old female cardiac arrest with V fib. Intubated. EXAM: PORTABLE CHEST 1 VIEW COMPARISON:  02/28/2017 and earlier. FINDINGS: Portable AP semi upright view at 0542 hours. Endotracheal tube tip projects about 10 mm above the carina. Left IJ central  line appears stable. Enteric tube has been removed. New pneumomediastinum and supraclavicular subcutaneous gas. There is no pneumothorax identified. There is moderate gaseous distension of the visible stomach. Increasing left lower lobe collapse or consolidation. Continued bilateral hazy pulmonary opacity. No pleural effusion identified. Stable cardiac size and mediastinal contours. IMPRESSION: 1. New pneumomediastinum and supraclavicular subcutaneous emphysema. Etiology is unclear, with no pneumothorax identified. 2. Enteric tube removed. ET tube tip 10 mm above the carina and stable left IJ central line. 3. New left lower lobe collapse or consolidation. Continued diffuse hazy pulmonary opacity which might reflect pulmonary edema or ARDS. 4. Interval gaseous distension of the  stomach. Electronically Signed: By: Odessa Fleming M.D. On: 03/01/2017 06:19   Dg Chest Port 1 View  Result Date: 02/28/2017 CLINICAL DATA:  Respiratory failure. EXAM: PORTABLE CHEST 1 VIEW COMPARISON:  09/2016 FINDINGS: Endotracheal tube, left IJ line, NG tube in stable position. Heart size stable. Persistent bilateral airspace disease again noted without significant change. No prominent pleural effusion. No pneumothorax. IMPRESSION: 1. Lines and tubes in stable position. 2. Persistent bilateral diffuse airspace disease without significant change. Electronically Signed   By: Maisie Fus  Register   On: 02/28/2017 06:21   Dg Chest Port 1 View  Result Date: 02/27/2017 CLINICAL DATA:  Respiratory failure. EXAM: PORTABLE CHEST 1 VIEW COMPARISON:  02/26/2017. FINDINGS: Endotracheal tube, left IJ line, NG tube in stable position. Heart size stable. Diffuse bilateral airspace disease again noted. Slight interim improvement. No prominent pleural effusion or pneumothorax. IMPRESSION: 1. Lines and tubes in stable position. 2. Diffuse bilateral airspace disease again noted. Slight interim improvement. Electronically Signed   By: Maisie Fus  Register   On:  02/27/2017 06:23   Dg Chest Portable 1 View  Result Date: 02/25/2017 CLINICAL DATA:  Status post intubation. EXAM: PORTABLE CHEST 1 VIEW COMPARISON:  None. FINDINGS: The heart size and mediastinal contours are within normal limits. Endotracheal tube is seen projected over tracheal air shadow with distal tip 4 cm above the carina. Nasogastric tube is seen looped within proximal stomach. Bilateral lung opacities are noted concerning for edema or possibly inflammation. No definite pneumothorax or pleural effusion is noted. The visualized skeletal structures are unremarkable. IMPRESSION: Endotracheal and nasogastric tubes in grossly good position. Mild bilateral lung opacities are noted concerning for edema or possibly inflammation. Electronically Signed   By: Lupita Raider, M.D.   On: 02/25/2017 23:01   Dg Abd Portable 1v  Result Date: 03/07/2017 CLINICAL DATA:  Repositioning of NG tube. EXAM: PORTABLE ABDOMEN - 1 VIEW COMPARISON:  One-view abdomen 03/07/2017 FINDINGS: The side port of the NG tube is now in the antrum of the stomach. By gas pattern is normal. Bibasilar airspace disease remains, left greater than right. Left pleural effusion is present. IMPRESSION: 1. NG tube in place.  The side port is in the antrum of the stomach. 2. Normal bowel gas pattern. 3. Bibasilar airspace disease/lobar pneumonia. Electronically Signed   By: Marin Roberts M.D.   On: 03/07/2017 13:09   Dg Abd Portable 1v  Result Date: 02/26/2017 CLINICAL DATA:  56 y/o  F; encounter for feeding tube placement. EXAM: PORTABLE ABDOMEN - 1 VIEW COMPARISON:  None. FINDINGS: Right hemiabdomen is excluded from field of view. Normal bowel gas pattern. Enteric tube tip projects over the distal stomach. Cholecystectomy clips. Bones are unremarkable. IMPRESSION: Enteric tube tip projects over distal stomach. Electronically Signed   By: Mitzi Hansen M.D.   On: 02/26/2017 14:39   Ct Angio Chest Aorta W And/or Wo  Contrast  Result Date: 02/25/2017 CLINICAL DATA:  Unwitnessed fall, unresponsive. EXAM: CT ANGIOGRAPHY CHEST WITH CONTRAST TECHNIQUE: Multidetector CT imaging of the chest was performed using the standard protocol during bolus administration of intravenous contrast. Multiplanar CT image reconstructions and MIPs were obtained to evaluate the vascular anatomy. CONTRAST:  100 mL of Isovue 370 intravenously. COMPARISON:  Radiograph of same day. FINDINGS: Cardiovascular: Suboptimal opacification of pulmonary arteries is noted due to timing of contrast bolus as well as respiratory motion artifact. Large central pulmonary embolus is not identified in the main pulmonary artery or the main portions of the right or left pulmonary emboli.  However, smaller peripheral pulmonary emboli cannot be excluded on the basis of this exam. There is no evidence of thoracic aortic dissection or aneurysm. No pericardial effusion is noted. Mediastinum/Nodes: Endotracheal tube is in grossly good position. No mediastinal adenopathy is noted. Thyroid gland is unremarkable. Lungs/Pleura: No pneumothorax or pleural effusion is noted. Bilateral diffuse lung opacities are noted concerning for edema or pneumonia. Upper Abdomen: Nasogastric tube tip is seen in distal stomach. Musculoskeletal: No chest wall abnormality. No acute or significant osseous findings. Review of the MIP images confirms the above findings. IMPRESSION: Suboptimal opacification of pulmonary arteries is noted due to timing of contrast bolus as well as respiratory motion artifact. Large central pulmonary embolus is not identified, but smaller peripheral emboli cannot be excluded on the basis of this exam. Bilateral lung opacities are noted concerning for edema or pneumonia. Endotracheal and nasogastric tubes appear to be in good position. Electronically Signed   By: Lupita Raider, M.D.   On: 02/25/2017 23:35    EKG: normal sinus rhythm, LVH, T wave inversion in the lateral  leads  Echo: 03/03/2017 - Left ventricle: The cavity size was normal. There was mild   concentric hypertrophy. Systolic function was normal. The   estimated ejection fraction was in the range of 55% to 60%. Wall   motion was normal; there were no regional wall motion   abnormalities. - Aortic valve: There was mild regurgitation. - Pulmonary arteries: Systolic pressure was mildly to moderately   increased. PA peak pressure: 46 mm Hg (S).  Cardiac cath: 02/26/2017 non obstructive dz  Medical decision making:  Discussed care with the patient Discussed care with the physician on the phone Reviewed labs and imaging personally Reviewed prior records  ASSESSMENT AND PLAN:  This is a 56 y.o. female with HTN presented with diarrhea and hypokalemia complicated with cardiac arrest, cardiogenic shock and respiratory failure from MRSA pneumonia extubated yesterday presented for evaluation of ICD/cardiac MRI to [facility] from [facility] hospital.    Principal Problem:   Cardiac arrest with ventricular fibrillation (HCC) Active Problems:   Hypertension   Acute respiratory failure (HCC)   Hypokalemia   Cardiogenic shock (HCC)   Acute pulmonary edema (HCC)   Anoxic encephalopathy (HCC)   Bradycardia   AKI (acute kidney injury) (HCC)   Diarrhea   Cardiac arrest with VF - likely 2/2 hypokalemia complication of watery diarrhea, normal QT, no repeat event, not on antiarrhythmic, amiodarone caused bradycardia - cardiac MRI on Monday, keep NPO post midnight - EP evaluation for need for ICD - Keep potassium >4, calcium >9, magnesium >2  Generalized weakness 2/2 prolonged intubation/deconditioning - encourage mobility, PT/OT evaluation on discharge  Hoarse voice  - speech therapy  MRSA pneumonia - treated with antibiotics, complicated course, asymptomatic   Hypertension, - well controlled  AKI - resolved  Diarrhea - resolved, stool tests negative for panel of viruses and bacteria  Cardiogenic  shock, resolved     Signed, Joellyn Rued, MD MS 03/14/2017, 11:17 PM

## 2017-03-15 NOTE — Progress Notes (Signed)
MD on call Virgina Organ notified. No new orders at this time.   Mccabe Gloria, RN

## 2017-03-15 NOTE — Progress Notes (Signed)
New Admission Note:   Arrival Method: Care Link  Mental Orientation: Alert and oriented x4 Telemetry:3e box 33  Assessment: Completed Pain:0/10 Tubes: None Safety Measures: Safety Fall Prevention Plan has been discussed  Admission:  [facility] Orientation: Patient has been orientated to the room, unit and staff.  Family: none at bedside  Orders to be reviewed and implemented. Will continue to monitor the patient. Call light has been placed within reach and bed alarm has been activated.   Bettey Mare, RN Phone: [phone]

## 2017-03-15 NOTE — Progress Notes (Signed)
No acute events overnight. Patient slept, no pain reported 0//10.   Will continue to monitor.  Dave Mannes, RN

## 2017-03-15 NOTE — Progress Notes (Signed)
Troponin  0.08. Patient denies pain 0/10. Patient asleep. Will continue to monitor.  Leslee Suire, RN

## 2017-03-16 NOTE — Progress Notes (Signed)
Patient day uneventful. She was able to get out of bed for bath with minimum assistance. Tolerated meals today with not acute distress, pain, or discomfort noted. Plan for MRI and ICD in am.

## 2017-03-16 NOTE — Progress Notes (Signed)
Progress Note  Patient Name: Miranda [name] Date of Encounter: 03/16/2017  Primary Cardiologist: Dr Mariah Milling Primary EP:  Graciela Husbands  Subjective   Doing well today, the patient denies CP or SOB.  No new concerns  Inpatient Medications    Scheduled Meds: . aspirin EC  81 mg Oral Daily  . carvedilol  3.125 mg Oral BID WC  . heparin  5,000 Units Subcutaneous Q8H  . lipase/protease/amylase  72,000 Units Oral TID AC  . QUEtiapine  25 mg Oral QHS   Continuous Infusions:  PRN Meds: ipratropium-albuterol, sodium chloride flush   Vital Signs    Vitals:   03/15/17 1217 03/15/17 2058 03/16/17 0129 03/16/17 0711  BP: 123/68 (!) 112/55 119/60 140/66  Pulse: 83 76 70 82  Resp: Temp: 98.2 F (36.8 C) 98.4 F (36.9 C) 98 F (36.7 C) 99.3 F (37.4 C)  TempSrc: Oral Oral Oral Oral  SpO2: 98% 96% 95% 97%  Weight:    180 lb 4.8 oz (81.8 kg)  Height:        Intake/Output Summary (Last 24 hours) at 03/16/17 1100 Last data filed at 03/16/17 0950  Gross per 24 hour  Intake              840 ml  Output              550 ml  Net              290 ml   Filed Weights   03/14/17 2251 03/15/17 0431 03/16/17 0711  Weight: 181 lb 11.2 oz (82.4 kg) 176 lb (79.8 kg) 180 lb 4.8 oz (81.8 kg)    Telemetry    Sinus rhythm - Personally Reviewed  Physical Exam   GEN- The patient is overweight appearing, alert and oriented x 3 today.   Head- normocephalic, atraumatic Eyes-  Sclera clear, conjunctiva pink Ears- hearing intact Oropharynx- clear Neck- supple, Lungs- Clear to ausculation bilaterally, normal work of breathing Heart- Regular rate and rhythm  GI- soft, NT, ND, + BS Extremities- no clubbing, cyanosis, or edema, groin is without hematoma/ bruit MS- no significant deformity or atrophy Skin- no rash or lesion Psych- euthymic mood, full affect Neuro- strength and sensation are intact   Labs    Chemistry Recent Labs Lab 03/13/17 0416 03/14/17 0420 03/15/17 0026  03/15/17 0637  NA 145 142  --  139  K 4.1 4.4  --  3.7  CL 112* 111  --  107  CO2 25 25  --  23  GLUCOSE 106* 113*  --  106*  BUN 23* 25*  --  22*  CREATININE 0.45 0.50 0.56 0.55  CALCIUM 8.5* 8.8*  --  8.5*  PROT  --   --   --  6.1*  ALBUMIN  --   --   --  2.8*  AST  --   --   --  37  ALT  --   --   --  67*  ALKPHOS  --   --   --  72  BILITOT  --   --   --  1.1  GFRNONAA >60 >60 >60 >60  GFRAA >60 >60 >60 >60  ANIONGAP 8 6  --  9     Hematology Recent Labs Lab 03/14/17 0420 03/15/17 0026 03/15/17 0637  WBC 7.5 6.5 5.2  RBC 3.35* 3.24* 3.25*  HGB 10.4* 9.8* 9.6*  HCT 30.3* 30.4* 30.4*  MCV 90.4 93.8 93.5  MCH 31.0 30.2 29.5  MCHC 34.3 32.2 31.6  RDW 14.0 15.2 14.8  PLT 326 184 282    Cardiac Enzymes Recent Labs Lab 03/15/17 0026 03/15/17 0637 03/15/17 1141  TROPONINI 0.08* 0.08* 0.09*   No results for input(s): TROPIPOC in the last 168 hours.     Patient Profile     56 y.o. female with no prior cardiac history who is admitted with ventricular fibrillation arrest, now with ROSC.  She is transferred by Dr Graciela Husbands from [facility] to [facility] for cardiac MRI and consideration for ICD.  Assessment & Plan    1. Cardiac arrest She has made meaningful recovery Arrest requiring defibrillation in the setting of low K and imodium/ diarrhea Plans as per Dr Graciela Husbands are for cardiac MRI tomorrow, potentially followed by ICD implantation.  MRI is ordered.  Hillis Range MD, [facility] 03/16/2017 11:00 AM

## 2017-03-17 ENCOUNTER — Encounter (HOSPITAL_COMMUNITY)
Admission: AD | Disposition: A | Discharge status: Rehabilitation Facility | Payer: Self-pay | Source: Other Acute Inpatient Hospital | Attending: Cardiology

## 2017-03-17 ENCOUNTER — Ambulatory Visit (HOSPITAL_COMMUNITY)
Admission: RE | Admit: 2017-03-17 | Payer: BC Managed Care – PPO | Source: Ambulatory Visit | Admitting: Internal Medicine

## 2017-03-17 ENCOUNTER — Inpatient Hospital Stay (HOSPITAL_COMMUNITY): Payer: BC Managed Care – PPO

## 2017-03-17 HISTORY — PX: ICD IMPLANT: EP1208

## 2017-03-17 LAB — GLUCOSE, CAPILLARY
Glucose-Capillary: 103 mg/dL — ABNORMAL HIGH (ref 65–99)
Glucose-Capillary: 94 mg/dL (ref 65–99)

## 2017-03-17 LAB — SURGICAL PCR SCREEN
MRSA, PCR: NEGATIVE
Staphylococcus aureus: POSITIVE — AB

## 2017-03-17 SURGERY — ICD IMPLANT

## 2017-03-17 MED ORDER — ONDANSETRON HCL 4 MG/2ML IJ SOLN: 4.0000 mg | Freq: Four times a day (QID) | INTRAMUSCULAR | Status: DC | PRN

## 2017-03-17 MED ORDER — SODIUM CHLORIDE 0.9 % IV SOLN
INTRAVENOUS | Status: AC
  Administered 2017-03-17: 16:00:00 via INTRAVENOUS

## 2017-03-17 MED ORDER — MIDAZOLAM HCL 5 MG/5ML IJ SOLN
INTRAMUSCULAR | Status: AC
  Filled 2017-03-17: qty 5

## 2017-03-17 MED ORDER — LIDOCAINE HCL (PF) 1 % IJ SOLN
INTRAMUSCULAR | Status: DC | PRN
  Administered 2017-03-17: 75 mL

## 2017-03-17 MED ORDER — LIDOCAINE HCL (PF) 1 % IJ SOLN
INTRAMUSCULAR | Status: AC
  Filled 2017-03-17: qty 60

## 2017-03-17 MED ORDER — ACETAMINOPHEN 325 MG PO TABS: 325.0000 mg | ORAL_TABLET | ORAL | Status: DC | PRN

## 2017-03-17 MED ORDER — MIDAZOLAM HCL 5 MG/5ML IJ SOLN
INTRAMUSCULAR | Status: DC | PRN
  Administered 2017-03-17: 2 mg via INTRAVENOUS
  Administered 2017-03-17: 1 mg via INTRAVENOUS

## 2017-03-17 MED ORDER — HEPARIN (PORCINE) IN NACL 2-0.9 UNIT/ML-% IJ SOLN
INTRAMUSCULAR | Status: AC
  Filled 2017-03-17: qty 500

## 2017-03-17 MED ORDER — LIDOCAINE HCL (PF) 1 % IJ SOLN
INTRAMUSCULAR | Status: AC
  Filled 2017-03-17: qty 30

## 2017-03-17 MED ORDER — SODIUM CHLORIDE 0.9 % IV SOLN
INTRAVENOUS | Status: DC
  Administered 2017-03-17: 11:00:00 via INTRAVENOUS

## 2017-03-17 MED ORDER — VANCOMYCIN HCL IN DEXTROSE 1-5 GM/200ML-% IV SOLN
1000.0000 mg | Freq: Two times a day (BID) | INTRAVENOUS | Status: AC
  Administered 2017-03-18: 1000 mg via INTRAVENOUS
  Filled 2017-03-17: qty 200

## 2017-03-17 MED ORDER — CHLORHEXIDINE GLUCONATE 4 % EX LIQD
60.0000 mL | Freq: Once | CUTANEOUS | Status: AC
Start: 2017-03-17 — End: 2017-03-17
  Administered 2017-03-17: 4 via TOPICAL

## 2017-03-17 MED ORDER — FENTANYL CITRATE (PF) 100 MCG/2ML IJ SOLN
INTRAMUSCULAR | Status: DC | PRN
  Administered 2017-03-17: 50 ug via INTRAVENOUS
  Administered 2017-03-17: 25 ug via INTRAVENOUS

## 2017-03-17 MED ORDER — GADOBENATE DIMEGLUMINE 529 MG/ML IV SOLN
30.0000 mL | Freq: Once | INTRAVENOUS | Status: AC
  Administered 2017-03-17: 27 mL via INTRAVENOUS

## 2017-03-17 MED ORDER — FENTANYL CITRATE (PF) 100 MCG/2ML IJ SOLN
INTRAMUSCULAR | Status: AC
  Filled 2017-03-17: qty 2

## 2017-03-17 MED ORDER — SODIUM CHLORIDE 0.9 % IV SOLN
INTRAVENOUS | Status: DC
  Administered 2017-03-17 (×2): via INTRAVENOUS

## 2017-03-17 MED ORDER — CHLORHEXIDINE GLUCONATE 4 % EX LIQD
CUTANEOUS | Status: AC
  Administered 2017-03-17: 11:00:00
  Filled 2017-03-17: qty 15

## 2017-03-17 MED ORDER — GENTAMICIN SULFATE 40 MG/ML IJ SOLN
80.0000 mg | INTRAMUSCULAR | Status: AC
  Administered 2017-03-17: 80 mg
  Filled 2017-03-17: qty 2

## 2017-03-17 MED ORDER — VANCOMYCIN HCL IN DEXTROSE 1-5 GM/200ML-% IV SOLN
1000.0000 mg | INTRAVENOUS | Status: AC
  Administered 2017-03-17: 1000 mg via INTRAVENOUS
  Filled 2017-03-17: qty 200

## 2017-03-17 MED ORDER — CHLORHEXIDINE GLUCONATE 4 % EX LIQD
60.0000 mL | Freq: Once | CUTANEOUS | Status: AC
  Administered 2017-03-17: 4 via TOPICAL

## 2017-03-17 MED ORDER — HEPARIN (PORCINE) IN NACL 2-0.9 UNIT/ML-% IJ SOLN
INTRAMUSCULAR | Status: AC | PRN
  Administered 2017-03-17: 500 mL

## 2017-03-17 SURGICAL SUPPLY — 8 items
CABLE SURGICAL S-101-97-12 (CABLE) ×2 IMPLANT
HEMOSTAT SURGICEL 2X4 FIBR (HEMOSTASIS) ×2 IMPLANT
ICD VISIA MRI VR DVFB1D4 (ICD Generator) ×1 IMPLANT
LEAD SPRINT QUAT SEC 6935M-55 (Lead) ×2 IMPLANT
PAD DEFIB LIFELINK (PAD) ×2 IMPLANT
SHEATH CLASSIC 9F (SHEATH) ×2 IMPLANT
TRAY PACEMAKER INSERTION (PACKS) ×2 IMPLANT
VISIA MRI VR DVFB1D4 (ICD Generator) ×2 IMPLANT

## 2017-03-17 NOTE — Progress Notes (Signed)
Progress Note  Patient Name: Miranda [name] Date of Encounter: 03/17/2017  Primary Cardiologist: Dr Mariah Milling Primary EP:  Graciela Husbands  Subjective   Continues to do well, the patient denies CP or SOB.  No new concerns  Inpatient Medications    Scheduled Meds: . aspirin EC  81 mg Oral Daily  . carvedilol  3.125 mg Oral BID WC  . heparin  5,000 Units Subcutaneous Q8H  . lipase/protease/amylase  72,000 Units Oral TID AC  . QUEtiapine  25 mg Oral QHS   Continuous Infusions:  PRN Meds: ipratropium-albuterol, sodium chloride flush   Vital Signs    Vitals:   03/16/17 0711 03/16/17 1206 03/16/17 2005 03/17/17 0601  BP: 140/66 108/65 (!) 111/53 116/63  Pulse: 82 73 73 76  Resp: Temp: 99.3 F (37.4 C) 99.2 F (37.3 C) 98.3 F (36.8 C) 98.2 F (36.8 C)  TempSrc: Oral Oral Oral Oral  SpO2: 97% 100% 99% 98%  Weight: 180 lb 4.8 oz (81.8 kg)   180 lb 12.8 oz (82 kg)  Height:        Intake/Output Summary (Last 24 hours) at 03/17/17 0928 Last data filed at 03/17/17 0925  Gross per 24 hour  Intake              960 ml  Output              525 ml  Net              435 ml   Filed Weights   03/15/17 0431 03/16/17 0711 03/17/17 0601  Weight: 176 lb (79.8 kg) 180 lb 4.8 oz (81.8 kg) 180 lb 12.8 oz (82 kg)    Telemetry    SR 70's - Personally Reviewed  Physical Exam   GEN- The patient is overweight appearing, alert and oriented x 3 today.   Head- normocephalic, atraumatic Eyes-  Sclera clear, conjunctiva pink Ears- hearing intact Oropharynx- clear Neck- supple, Lungs- Clear to ausculation bilaterally, normal work of breathing Heart- Regular rate and rhythm  GI- soft, NT, ND Extremities- no clubbing, cyanosis, or edema, LUE picc in place MS- no significant deformity or atrophy Skin- no rash or lesion Psych- euthymic mood, full affect Neuro- strength and sensation are intact   Labs    Chemistry  Recent Labs Lab 03/13/17 0416 03/14/17 0420 03/15/17 0026  03/15/17 0637  NA 145 142  --  139  K 4.1 4.4  --  3.7  CL 112* 111  --  107  CO2 25 25  --  23  GLUCOSE 106* 113*  --  106*  BUN 23* 25*  --  22*  CREATININE 0.45 0.50 0.56 0.55  CALCIUM 8.5* 8.8*  --  8.5*  PROT  --   --   --  6.1*  ALBUMIN  --   --   --  2.8*  AST  --   --   --  37  ALT  --   --   --  67*  ALKPHOS  --   --   --  72  BILITOT  --   --   --  1.1  GFRNONAA >60 >60 >60 >60  GFRAA >60 >60 >60 >60  ANIONGAP 8 6  --  9     Hematology  Recent Labs Lab 03/14/17 0420 03/15/17 0026 03/15/17 0637  WBC 7.5 6.5 5.2  RBC 3.35* 3.24* 3.25*  HGB 10.4* 9.8* 9.6*  HCT 30.3* 30.4* 30.4*  MCV 90.4 93.8 93.5  MCH 31.0 30.2 29.5  MCHC 34.3 32.2 31.6  RDW 14.0 15.2 14.8  PLT 326 184 282    Cardiac Enzymes  Recent Labs Lab 03/15/17 0026 03/15/17 0637 03/15/17 1141  TROPONINI 0.08* 0.08* 0.09*   No results for input(s): TROPIPOC in the last 168 hours.   TTE 03/03/2017: Study Conclusions - Left ventricle: The cavity size was normal. There was mild concentric hypertrophy. Systolic function was normal. The estimated ejection fraction was in the range of 55% to 60%. Wall motion was normal; there were no regional wall motion abnormalities. - Aortic valve: There was mild regurgitation. - Pulmonary arteries: Systolic pressure was mildly to moderately increased. PA peak pressure: 46 mm Hg (S). Impressions: - Limited echo to evaluate ejection fraction. When compared to recent echo, LV systolic function normalized completely with improvement in ejection fraction of from 10% to 55%.  [facility] 02/26/2017: Conclusion  Conclusions: 1. No angiographically significant coronary artery disease. 2. Severely reduced left ventricular contraction (<25%) with moderately elevated left ventricular filling pressure (LVEDP 28 mmHg).  Recommendations: 1. Hypothermia protocol per hospitalist and ICU teams. 2. Check magnesium level; replete electrolytes for goal  potassium and magnesium greater than 4.0 and 2.0, respectively. 3. Consider gentle diuresis, given elevated LVEDP and severely reduced LVEF. 4. Obtain transthoracic echocardiogram   TTE 02/26/2017: Study Conclusions - Left ventricle: The cavity size was mildly dilated. Systolic function was severely reduced. The estimated ejection fraction was <20%. Diffuse hypokinesis. Regional wall motion abnormalities cannot be excluded. The study is not technically sufficient to allow evaluation of LV diastolic function. - Mitral valve: There was mild regurgitation. - Right ventricle: Systolic function was mildly reduced. - Pulmonary arteries: Systolic pressure could not be accurately estimated. - Inferior vena cava: The vessel was normal in size. The respirophasic diameter changes were in the normal range (>= 50%), consistent with normal central venous pressure.    Patient Profile     56 y.o. female with no prior cardiac history who is admitted with ventricular fibrillation arrest, now with ROSC.  She is transferred by Dr Graciela Husbands from [facility] to [facility] for cardiac MRI and consideration for ICD.  Assessment & Plan    1. Cardiac arrest S/p cardiogenic shock, hypothermia protocol, intubation, ARF She has made meaningful recovery Arrest requiring defibrillation in the setting of low K and imodium/ diarrhea  S/p Cardiac MRI this AM Planned for ICD this afternoon  Discussed ICD implant procedure, risks and benefits, the patient would like to proceed  3. Acute systolic CHF: with interval resolution of LVEF  Confirmed today by MRI >> normal EF and no Gad enhancement  -Severely reduced LVEF by LV gram and by echocardiogram August 22EF of 10-15% with global hypokinesis -Repeat echo 8/27 showed improved EF to 55-60%, no RWMA -initially treated with milrinone, discontinued and on BB now  4. Diarrhea: -Remains resolved -Seen by GI, outpatient EGD/colonoscopy  5. Possible  multilobar PNA resolveved  -S/p bronch -Culture negative -Off ABX as of 8/31   Francis Dowse, PA-C 03/17/2017 9:28 AM  As above Seen and agree and edited

## 2017-03-17 NOTE — Progress Notes (Addendum)
Inpatient Rehabilitation  I am continuing to follow this case, which is known to me from [facility] when therapy was recommending IP Rehab for post acute therapy.  Note plans for procedures today.  Plan to continue to follow for current PT/OT evaluations and recommendations in order to obtain insurance authorization for hopeful IP Rehab admission.  Please call with questions.  Charlane Ferretti., CCC/SLP Admission Coordinator  [facility] Inpatient Rehabilitation  Cell [phone]

## 2017-03-17 NOTE — Progress Notes (Signed)
   03/17/17 1000  Clinical Encounter Type  Visited With Patient  Visit Type Follow-up  Referral From Nurse  Consult/Referral To Chaplain  Recommendations Continued prayer  Spiritual Encounters  Spiritual Needs Prayer  Stress Factors  Patient Stress Factors Health changes  Family Stress Factors None identified  Echo has [employer] faith background, she requested prayer before her procedure today.  Chaplain explored overall mood of patient and she appeared to be thankful and hopeful

## 2017-03-17 NOTE — Discharge Instructions (Signed)
° ° °  Supplemental Discharge Instructions for  Pacemaker/Defibrillator Patients  Activity No heavy lifting or vigorous activity with your left/right arm for 6 to 8 weeks.  Do not raise your left/right arm above your head for one week.  Gradually raise your affected arm as drawn below.             03/21/17                     03/22/17                    03/23/17                   03/24/17 __  NO DRIVING for 6 months  WOUND CARE - Keep the wound area clean and dry.  Do not get this area wet for one week. No showers for 24 hours; you may shower on  03/19/17. - The tape/steri-strips on your wound will fall off; do not pull them off.  No bandage is needed on the site.  DO  NOT apply any creams, oils, or ointments to the wound area. - If you notice any drainage or discharge from the wound, any swelling or bruising at the site, or you develop a fever > 101? F after you are discharged home, call the office at once.  Special Instructions - You are still able to use cellular telephones; use the ear opposite the side where you have your pacemaker/defibrillator.  Avoid carrying your cellular phone near your device. - When traveling through airports, show security personnel your identification card to avoid being screened in the metal detectors.  Ask the security personnel to use the hand wand. - Avoid arc welding equipment, MRI testing (magnetic resonance imaging), TENS units (transcutaneous nerve stimulators).  Call the office for questions about other devices. - Avoid electrical appliances that are in poor condition or are not properly grounded. - Microwave ovens are safe to be near or to operate.  Additional information for defibrillator patients should your device go off: - If your device goes off ONCE and you feel fine afterward, notify the device clinic nurses. - If your device goes off ONCE and you do not feel well afterward, call 911. - If your device goes off TWICE, call 911. - If your device  goes off THREE times in one day, call 911.  DO NOT DRIVE YOURSELF OR A FAMILY MEMBER WITH A DEFIBRILLATOR TO THE HOSPITAL--CALL 911.

## 2017-03-17 NOTE — Progress Notes (Signed)
The patient was made aware, [place] law, no driving for 6 months.   Francis Dowse, PA-C

## 2017-03-18 ENCOUNTER — Inpatient Hospital Stay (HOSPITAL_COMMUNITY): Payer: BC Managed Care – PPO

## 2017-03-18 ENCOUNTER — Other Ambulatory Visit: Payer: Self-pay

## 2017-03-18 ENCOUNTER — Encounter (HOSPITAL_COMMUNITY): Payer: Self-pay | Admitting: Internal Medicine

## 2017-03-18 DIAGNOSIS — G931 Anoxic brain damage, not elsewhere classified: Secondary | ICD-10-CM

## 2017-03-18 DIAGNOSIS — I5021 Acute systolic (congestive) heart failure: Secondary | ICD-10-CM

## 2017-03-18 LAB — GLUCOSE, CAPILLARY: GLUCOSE-CAPILLARY: 107 mg/dL — AB (ref 65–99)

## 2017-03-18 MED ORDER — CHLORHEXIDINE GLUCONATE CLOTH 2 % EX PADS
6.0000 | MEDICATED_PAD | Freq: Every day | CUTANEOUS | Status: DC
  Administered 2017-03-19 – 2017-03-20 (×2): 6 via TOPICAL

## 2017-03-18 MED ORDER — MUPIROCIN 2 % EX OINT
1.0000 "application " | TOPICAL_OINTMENT | Freq: Two times a day (BID) | CUTANEOUS | Status: DC
  Administered 2017-03-18 – 2017-03-20 (×4): 1 via NASAL
  Filled 2017-03-18: qty 22

## 2017-03-18 NOTE — Progress Notes (Signed)
Patient with no complaints or concerns during 7pm - 7am shift. Slept during the night. Denies pain.   Jacquese Cassarino, RN 

## 2017-03-18 NOTE — Progress Notes (Signed)
Pt educated about safety and importance of bed alarm during the night however pt refuses to be on bed alarm. Will continue to round on patient.   Norleen Xie, RN    

## 2017-03-18 NOTE — Progress Notes (Signed)
Progress Note  Patient Name: Miranda [name] Date of Encounter: 03/18/2017  Primary Cardiologist: Dr Mariah Milling Primary EP:  Graciela Husbands  Subjective   Continues to do well, the patient denies CP or SOB.  HAsn't been OOB, feels weak, mentions she was told she would qualify for inpatient rehab and would like to be considered/evaluated for this  Inpatient Medications    Scheduled Meds: . aspirin EC  81 mg Oral Daily  . carvedilol  3.125 mg Oral BID WC  . lipase/protease/amylase  72,000 Units Oral TID AC  . QUEtiapine  25 mg Oral QHS   Continuous Infusions:  PRN Meds: ipratropium-albuterol, ondansetron (ZOFRAN) IV   Vital Signs    Vitals:   03/18/17 0044 03/18/17 0634 03/18/17 0739 03/18/17 1201  BP: 124/64 125/67 132/67 (!) 142/74  Pulse: 80 77 77 76  Resp: 18 18 14 14   Temp: 98.7 F (37.1 C) 98.6 F (37 C) 98.5 F (36.9 C)   TempSrc: Oral Oral Oral   SpO2: 96% 94% 97% 98%  Weight:  181 lb 6.4 oz (82.3 kg)    Height:        Intake/Output Summary (Last 24 hours) at 03/18/17 1245 Last data filed at 03/18/17 1014  Gross per 24 hour  Intake              370 ml  Output              450 ml  Net              -80 ml   Filed Weights   03/16/17 0711 03/17/17 0601 03/18/17 0634  Weight: 180 lb 4.8 oz (81.8 kg) 180 lb 12.8 oz (82 kg) 181 lb 6.4 oz (82.3 kg)    Telemetry    SR 70's - Personally Reviewed  Physical Exam    GEN- The patient is overweight appearing, alert and oriented x 3 today, in NAD.   Head- normocephalic, atraumatic Eyes-  Sclera clear, conjunctiva pink Ears- hearing intact Oropharynx- clear Neck- supple, Lungs- CTA b/l, normal work of breathing Heart- RRR GI- soft, NT, ND Extremities- no clubbing, cyanosis, or edema, LUE picc in place MS- no significant deformity or atrophy Skin- no rash or lesion Psych- euthymic mood, full affect Neuro- no gross deficits appreciated  Implant site is stable, no hematoma, bleeding   Labs    Chemistry  Recent  Labs Lab 03/13/17 0416 03/14/17 0420 03/15/17 0026 03/15/17 0637  NA 145 142  --  139  K 4.1 4.4  --  3.7  CL 112* 111  --  107  CO2 25 25  --  23  GLUCOSE 106* 113*  --  106*  BUN 23* 25*  --  22*  CREATININE 0.45 0.50 0.56 0.55  CALCIUM 8.5* 8.8*  --  8.5*  PROT  --   --   --  6.1*  ALBUMIN  --   --   --  2.8*  AST  --   --   --  37  ALT  --   --   --  67*  ALKPHOS  --   --   --  72  BILITOT  --   --   --  1.1  GFRNONAA >60 >60 >60 >60  GFRAA >60 >60 >60 >60  ANIONGAP 8 6  --  9     Hematology  Recent Labs Lab 03/14/17 0420 03/15/17 0026 03/15/17 0637  WBC 7.5 6.5 5.2  RBC 3.35* 3.24* 3.25*  HGB 10.4* 9.8* 9.6*  HCT 30.3* 30.4* 30.4*  MCV 90.4 93.8 93.5  MCH 31.0 30.2 29.5  MCHC 34.3 32.2 31.6  RDW 14.0 15.2 14.8  PLT 326 184 282    Cardiac Enzymes  Recent Labs Lab 03/15/17 0026 03/15/17 0637 03/15/17 1141  TROPONINI 0.08* 0.08* 0.09*   No results for input(s): TROPIPOC in the last 168 hours.   TTE 03/03/2017: Study Conclusions - Left ventricle: The cavity size was normal. There was mild concentric hypertrophy. Systolic function was normal. The estimated ejection fraction was in the range of 55% to 60%. Wall motion was normal; there were no regional wall motion abnormalities. - Aortic valve: There was mild regurgitation. - Pulmonary arteries: Systolic pressure was mildly to moderately increased. PA peak pressure: 46 mm Hg (S). Impressions: - Limited echo to evaluate ejection fraction. When compared to recent echo, LV systolic function normalized completely with improvement in ejection fraction of from 10% to 55%.  LHC 02/26/2017: Conclusion  Conclusions: 1. No angiographically significant coronary artery disease. 2. Severely reduced left ventricular contraction (<25%) with moderately elevated left ventricular filling pressure (LVEDP 28 mmHg).  Recommendations: 1. Hypothermia protocol per hospitalist and ICU  teams. 2. Check magnesium level; replete electrolytes for goal potassium and magnesium greater than 4.0 and 2.0, respectively. 3. Consider gentle diuresis, given elevated LVEDP and severely reduced LVEF. 4. Obtain transthoracic echocardiogram   TTE 02/26/2017: Study Conclusions - Left ventricle: The cavity size was mildly dilated. Systolic function was severely reduced. The estimated ejection fraction was <20%. Diffuse hypokinesis. Regional wall motion abnormalities cannot be excluded. The study is not technically sufficient to allow evaluation of LV diastolic function. - Mitral valve: There was mild regurgitation. - Right ventricle: Systolic function was mildly reduced. - Pulmonary arteries: Systolic pressure could not be accurately estimated. - Inferior vena cava: The vessel was normal in size. The respirophasic diameter changes were in the normal range (>= 50%), consistent with normal central venous pressure.    Patient Profile     56 y.o. female with no prior cardiac history who is admitted with ventricular fibrillation arrest, now with ROSC.  She is transferred by Dr Graciela Husbands from [facility] to [facility] for cardiac MRI and consideration for ICD.  Assessment & Plan    1. Cardiac arrest S/p cardiogenic shock, hypothermia protocol, intubation, ARF She has made meaningful recovery Arrest requiring defibrillation in the setting of low K and imodium/ diarrhea  S/p Cardiac MRI negative S/p ICD implant yesterday by dr. Graciela Husbands Device interrogation this AM with stable measurements/function CXR this AM without ptx Wound care and activity restrictions were discussed with the patient The patient is aware of [place] law, no driving 6 months post arrest event   2. Acute systolic CHF: with interval resolution of LVEF  Confirmed today by MRI >> normal EF and no Gad enhancement -Severely reduced LVEF by LV gram and by echocardiogram August 22EF of 10-15% with global  hypokinesis -Repeat echo 8/27 showed improved EF to 55-60%, no RWMA -initially treated with milrinone, discontinued and on BB now   3. Diarrhea: -Remains resolved -Seen by GI, outpatient EGD/colonoscopy   4. Possible multilobar PNA resolveved  -S/p bronch -Culture negative -Off ABX as of 8/31  5. Functional decline     PT recommends CIR     Rehab admissions coordinator has followed     Will consult rehab MD to assess   Francis Dowse, PA-C 03/18/2017 12:45 PM

## 2017-03-18 NOTE — Progress Notes (Signed)
Inpatient Rehabilitation  Per PT request, patient was screened by Fae Pippin for appropriateness for an Inpatient Acute Rehab consult.  At this time we are recommending an Inpatient Rehab consult.  I have called to notify the RN case manager.  MD please order if you are agreeable.  Call if questions.  Charlane Ferretti., CCC/SLP Admission Coordinator  [facility] Inpatient Rehabilitation  Cell [phone]

## 2017-03-18 NOTE — Consult Note (Signed)
Physical Medicine and Rehabilitation Consult Reason for Consult: Decreased functional mobility Referring Physician: Dr. Wyline Mood   HPI: Miranda [name] is a 56 y.o. right handed female with history of hypertension, migraine headaches. Per chart review patient lives with spouse. One level apartment. 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 Husband works during the day. Presented to [facility] 02/26/2017 with VF arrest underwent urgent left heart catheterization showed nonobstructive CAD. Prior to admission patient had watery diarrhea 3 days and was able to stay hydrated. Potassium level 2.1 on admission, creatinine 1.26. CT of the chest showed bilateral lung opacities concerning for edema versus pneumonia. Cranial CT scan negative. Patient did require intubation. Echocardiogram with ejection fraction of less than 20% diffuse hypokinesis. Patient was transferred to [facility] 03/15/2017 with cardiac MRI completed that was negative. Underwent ICD placement 03/17/2017 and patient since extubated. Hospital course follow-up gastroenterology for bouts of diarrhea with conservative care and consider colonoscopy plus EGD as an outpatient. Hypokalemia has resolved latest potassium 3.7. Physical therapy evaluation completed 03/18/2017 with recommendations of physical medicine and rehabilitation consult.   Review of Systems  Constitutional: Negative for chills and fever.  HENT: Negative for hearing loss.   Eyes: Negative for blurred vision and double vision.  Respiratory: Positive for shortness of breath. Negative for cough.   Cardiovascular: Negative for chest pain and palpitations.  Gastrointestinal: Positive for diarrhea and nausea.  Genitourinary: Negative for dysuria, flank pain and hematuria.  Skin: Negative for rash.  Neurological: Positive for headaches. Negative for seizures and weakness.  All other systems reviewed and are negative.  Past Medical History:  Diagnosis  Date  . Chronic systolic CHF (congestive heart failure) (HCC)    a. TTE 02/26/2017: EF < 20%, diffuse HK, mild AI, RV sys fxn mildly reduced; b. TTE 03/03/2017: EF 55-60%, nl WM, mild AI, PASP 46  . Coronary artery disease, non-occlusive    a. LHC 02/25/2017: no angiographically significant CAD, EF <35%, moderately elevated LVEDP at 28 mmHg  . Dependent edema    bilateral legs  . Hot flashes   . HTN (hypertension)   . Migraines   . Nausea   . Ventricular fibrillation (HCC)    a. 02/25/2017: LHC without signficant CAD; b. idiopathic; c. possibly 2/2 diarrhea with electrolyte abnormalities with Imodium usage   Past Surgical History:  Procedure Laterality Date  . APPENDECTOMY  1974  . CHOLECYSTECTOMY  1984  . ICD IMPLANT N/A 03/17/2017   Procedure: ICD Implant;  Surgeon: Duke Salvia, MD;  Location: [facility] INVASIVE CV LAB;  Service: Cardiovascular;  Laterality: N/A;  . LEFT HEART CATH AND CORONARY ANGIOGRAPHY N/A 02/26/2017   Procedure: LEFT HEART CATH AND CORONARY ANGIOGRAPHY;  Surgeon: Yvonne Kendall, MD;  Location: [facility] INVASIVE CV LAB;  Service: Cardiovascular;  Laterality: N/A;  . TONSILLECTOMY  1964   Family History  Problem Relation Age of Onset  . Lymphoma Mother   . Heart disease Father   . Stroke Father   . Hyperlipidemia Father   . Hypertension Father   . Hypertension Sister   . Migraines Sister   . Hypertension Brother    Social History:  reports that she has never smoked. She has never used smokeless tobacco. She reports that she does not drink alcohol or use drugs. Allergies:  Allergies  Allergen Reactions  . Ace Inhibitors   . Compazine [Prochlorperazine Edisylate]   . Lisinopril   . Penicillins     Has patient had  a PCN reaction causing immediate rash, facial/tongue/throat swelling, SOB or lightheadedness with hypotension: Unknown Has patient had a PCN reaction causing severe rash involving mucus membranes or skin necrosis: Unknown Has patient had a PCN reaction  that required hospitalization: Unknown Has patient had a PCN reaction occurring within the last 10 years: Unknown If all of the above answers are "NO", then may proceed with Cephalosporin use.    Medications Prior to Admission  Medication Sig Dispense Refill  . acetaminophen (TYLENOL) 500 MG tablet Take 1,000 mg by mouth every 6 (six) hours as needed.    . carvedilol (COREG) 3.125 MG tablet Take 1 tablet (3.125 mg total) by mouth 2 (two) times daily with a meal. 30 tablet 0  . ipratropium-albuterol (DUONEB) 0.5-2.5 (3) MG/3ML SOLN Take 3 mLs by nebulization every 6 (six) hours. 360 mL 0  . levalbuterol (XOPENEX) 1.25 MG/0.5ML nebulizer solution Take 1.25 mg by nebulization every 6 (six) hours as needed for wheezing or shortness of breath. 1 each 12  . lipase/protease/amylase (CREON) 36000 UNITS CPEP capsule Take 2 capsules (72,000 Units total) by mouth 3 (three) times daily before meals. 10 capsule 0  . QUEtiapine (SEROQUEL) 25 MG tablet Take 1 tablet (25 mg total) by mouth at bedtime. 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 Functional Status:  Mobility: Bed Mobility Overal bed mobility: Needs Assistance Bed Mobility: Supine to Sit Supine to sit: Min assist General bed mobility comments: Min A for trunk elevation. Required use of elevated HOB. Verbal cues to maintain precautions on LUE.  Transfers Overall transfer level: Needs assistance Equipment  used: 1 person hand held assist Transfers: Sit to/from Stand Sit to Stand: Min assist General transfer comment: Min A for lift assist and steadying upon standing. Verbal cues for maintenance of precautions on LUE.  Ambulation/Gait Ambulation/Gait assistance: Min assist, Mod assist Ambulation Distance (Feet): 15 Feet Assistive device: 1 person hand held assist Gait Pattern/deviations: Step-through pattern, Decreased stride length, Drifts right/left General Gait Details: Slow, unsteady gait and required min A up to mod A to maintain balance. Pt reports she has "sea legs" and feels fatigued after short ambulation distance. HR ranging from 90s to mid 100s.  Gait velocity: Decreased Gait velocity interpretation: Below normal speed for age/gender    ADL:    Cognition: Cognition Overall Cognitive Status: Within Functional Limits for tasks assessed Orientation Level: Oriented X4 Cognition Arousal/Alertness: Awake/alert Behavior During Therapy: WFL for tasks assessed/performed Overall Cognitive Status: Within Functional Limits for tasks assessed  Blood pressure (!) 142/74, pulse 76, temperature 98.5 F (36.9 C), temperature source Oral, resp. rate 14, height 5' (1.524 m), weight 82.3 kg (181 lb 6.4 oz), SpO2 98 %. Physical Exam  Constitutional: She is oriented to person, place, and time.  HENT:  Head: Normocephalic.  Eyes: EOM are normal.  Neck: Normal range of motion. Neck supple. No thyromegaly present.  Cardiovascular:  Cardiac rate controlled  Respiratory: Effort normal and breath sounds normal.  GI: Soft. Bowel sounds are normal. She exhibits no distension.  Neurological: She is alert and oriented  to person, place, and time.  Skin: Skin is warm and dry.    Results for orders placed or performed during the hospital encounter of 03/14/17 (from the past 24 hour(s))  Glucose, capillary     Status: Abnormal   Collection Time: 03/18/17  6:30 AM  Result Value Ref Range    Glucose-Capillary 107 (H) 65 - 99 mg/dL   Comment 1 Notify RN    Comment 2 Document in Chart    Dg Chest 2 View  Result Date: 03/18/2017 CLINICAL DATA:  Permanent pacemaker placement yesterday EXAM: CHEST  2 VIEW COMPARISON:  Chest x-ray of March 10, 2017 FINDINGS: There has been placement of a pacemaker generator over the left pectoral region with the single electrode extending along the left subclavian approach. The electrode terminates appropriately in the region of the right ventricular apex. There is no postprocedure pneumothorax. The lungs are well-expanded. The interstitial markings remain mildly increased. The heart is top-normal in size. The pulmonary vascularity is not engorged. IMPRESSION: There is no postprocedure complication following permanent pacemaker placement. Electronically Signed   By: David  [place] M.D.   On: 03/18/2017 07:22   Mr Cardiac Morphology W Wo Contrast  Result Date: 03/17/2017 CLINICAL DATA:  Cardiac Arrest EXAM: CARDIAC MRI TECHNIQUE: The patient was scanned on a 1.5 Tesla GE magnet. A dedicated cardiac coil was used. Functional imaging was done using Fiesta sequences. 2,3, and 4 chamber views were done to assess for RWMA's. Modified Simpson's rule using a short axis stack was used to calculate an ejection fraction on a dedicated work [employer] officer, [employer]. The patient received 27 cc of Multihance. After 10 minutes inversion recovery sequences were used to assess for infiltration and scar tissue. CONTRAST:  27 cc Multihance FINDINGS: All 4 cardiac chambers were normal in size and function. There was no ASD/VSD. There was a trivial mostly posterior pericardial effusion. There were no RV diverticula or areas of hypokinesis. The LV was mildly hypertrophied at 12 mm. There were no RWMAls The quantitative EF was 70% (EDV 89 cc ESV 27 cc SV 62 cc) Delayed enhancement images with gadolinium showed mild mid myocardial uptake in the distal anterior wall and inferior base  IMPRESSION: 1) Normal LV size mild LVH septal thickness 12 mm EF 70% 2) Mild mid epicardial gadolinium uptake in distal anterior wall and inferior base 3) Normal RV size and function 4) Trivial pericardial effusion Charlton Haws Electronically Signed   By: Charlton Haws M.D.   On: 03/17/2017 10:51    Assessment/Plan: Diagnosis: debility, severe deconditioning after VF cardiac arrest 1. Does the need for close, 24 hr/day medical supervision in concert with the patient's rehab needs make it unreasonable for this patient to be served in a less intensive setting? Yes 2. Co-Morbidities requiring supervision/potential complications: HTN, diarrhea, pneumonia 3. Due to bladder management, bowel management, safety, skin/wound care, disease management, medication administration, pain management and patient education, does the patient require 24 hr/day rehab nursing? Yes 4. Does the patient require coordinated care of a physician, rehab nurse, PT (1-2 hrs/day, 5 days/week) and OT (1-2 hrs/day, 5 days/week) to address physical and functional deficits in the context of the above medical diagnosis(es)? Yes Addressing deficits in the following areas: balance, endurance, locomotion, strength, transferring, bowel/bladder control, bathing, dressing, feeding, grooming, toileting and psychosocial support 5. Can the patient actively participate in an intensive therapy program of at least 3 hrs of therapy per day at least 5 days per week? Yes 6. The potential for  patient to make measurable gains while on inpatient rehab is excellent 7. Anticipated functional outcomes upon discharge from inpatient rehab are modified independent  with PT, modified independent with OT, n/a with SLP. 8. Estimated rehab length of stay to reach the above functional goals is: 7-11 days 9. Anticipated D/C setting: Home 10. Anticipated post D/C treatments: HH therapy and Outpatient therapy 11. Overall Rehab/Functional Prognosis:  excellent  RECOMMENDATIONS: This patient's condition is appropriate for continued rehabilitative care in the following setting: CIR Patient has agreed to participate in recommended program. Yes Note that insurance prior authorization may be required for reimbursement for recommended care.  Comment: Rehab Admissions Coordinator to follow up.  Thanks,  Ranelle Oyster, MD, [place] [facility]    Charlton Amor., PA-C 03/18/2017

## 2017-03-18 NOTE — Evaluation (Signed)
Physical Therapy Evaluation Patient Details Name: Miranda [name] MRN: [id number] DOB: 07/25/1960 Today's Date: 03/18/2017   History of Present Illness  Pt is a 56 y/o female presenting to [place] [facility] secondary to cardiac arrest. Underwent Cardiac cath upon arrival to [facility] which indicated reduced EF. Failed initial extubation on 8/25, however was successfully extubated on 03/11/17. Transferred to [facility] and underwent ICD implant on 9/10. PMH includes CHF, HTN, and migraines.   Clinical Impression  Pt presenting with problem above and deficits below. PTA, pt was independent with functional mobility and still working as a Geneticist, molecular. Upon eval, pt presenting with weakness, fatigue, decreased balance, and decreased endurance. Required min to mod A for functional mobility to maintain balance. Pt feeling unsafe to go home at this time. Educated about CIR to increase independence and safety with mobility and pt agreeable. Pt very motivated to regain independence so she can go back to work and has good support system at home. Will continue to follow acutely to progress mobility.     Follow Up Recommendations CIR    Equipment Recommendations  Rolling walker with 5" wheels;3in1 (PT)    Recommendations for Other Services Rehab consult;OT consult     Precautions / Restrictions Precautions Precautions: Fall Restrictions Weight Bearing Restrictions: Yes LUE Weight Bearing: Non weight bearing Other Position/Activity Restrictions: s/p cath procedure precautions      Mobility  Bed Mobility Overal bed mobility: Needs Assistance Bed Mobility: Supine to Sit     Supine to sit: Min assist     General bed mobility comments: Min A for trunk elevation. Required use of elevated HOB. Verbal cues to maintain precautions on LUE.   Transfers Overall transfer level: Needs assistance Equipment used: 1 person hand held assist Transfers: Sit to/from Stand Sit to Stand: Min assist          General transfer comment: Min A for lift assist and steadying upon standing. Verbal cues for maintenance of precautions on LUE.   Ambulation/Gait Ambulation/Gait assistance: Min assist;Mod assist Ambulation Distance (Feet): 15 Feet Assistive device: 1 person hand held assist Gait Pattern/deviations: Step-through pattern;Decreased stride length;Drifts right/left Gait velocity: Decreased Gait velocity interpretation: Below normal speed for age/gender General Gait Details: Slow, unsteady gait and required min A up to mod A to maintain balance. Pt reports she has "sea legs" and feels fatigued after short ambulation distance. HR ranging from 90s to mid 100s.   Stairs            Wheelchair Mobility    Modified Rankin (Stroke Patients Only)       Balance Overall balance assessment: Needs assistance Sitting-balance support: No upper extremity supported;Feet unsupported Sitting balance-Leahy Scale: Good     Standing balance support: Single extremity supported;During functional activity Standing balance-Leahy Scale: Poor Standing balance comment: Reliant on UE and external assist to maintain balance.                              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     Hand Dominance   Dominant Hand: Right    Extremity/Trunk Assessment   Upper Extremity Assessment Upper Extremity Assessment: Generalized weakness    Lower Extremity  Assessment Lower Extremity Assessment: Generalized weakness (grossly 3+/5 throughout. )    Cervical / Trunk Assessment Cervical / Trunk Assessment: Normal  Communication   Communication: No difficulties  Cognition Arousal/Alertness: Awake/alert Behavior During Therapy: WFL for tasks assessed/performed Overall Cognitive Status: Within Functional Limits for tasks assessed                                        General Comments General comments (skin integrity, edema, etc.): Pt reporting she does not feel comfortable to go home secondary to mobility deficits. Discussed CIR and pt agreeable.     Exercises     Assessment/Plan    PT Assessment Patient needs continued PT services  PT Problem List Decreased strength;Decreased range of motion;Decreased activity tolerance;Decreased balance;Decreased mobility;Decreased knowledge of use of DME;Cardiopulmonary status limiting activity;Pain;Decreased knowledge of precautions       PT Treatment Interventions DME instruction;Gait training;Stair training;Functional mobility training;Therapeutic activities;Therapeutic exercise;Balance training;Neuromuscular re-education;Patient/family education    PT Goals (Current goals can be found in the Care Plan section)  Acute Rehab PT Goals Patient Stated Goal: to get stronger and more independent PT Goal Formulation: With patient Time For Goal Achievement: 04/01/17 Potential to Achieve Goals: Good    Frequency Min 3X/week   Barriers to discharge        Co-evaluation               AM-PAC PT "6 Clicks" Daily Activity  Outcome Measure Difficulty turning over in bed (including adjusting bedclothes, sheets and blankets)?: A Lot Difficulty moving from lying on back to sitting on the side of the bed? : Unable Difficulty sitting down on and standing up from a chair with arms (e.g., wheelchair, bedside commode, etc,.)?: Unable Help needed moving to and from a bed to chair  (including a wheelchair)?: A Little Help needed walking in hospital room?: A Lot Help needed climbing 3-5 steps with a railing? : A Lot 6 Click Score: 11    End of Session Equipment Utilized During Treatment: Gait belt Activity Tolerance: Patient tolerated treatment well Patient left: in chair;with call bell/phone within reach Nurse Communication: Mobility status PT Visit Diagnosis: Muscle weakness (generalized) (M62.81);Other abnormalities of gait and mobility (R26.89);Unsteadiness on feet (R26.81)    Time: 1610-9604 PT Time Calculation (min) (ACUTE ONLY): 20 min   Charges:   PT Evaluation $PT Eval Moderate Complexity: 1 Mod     PT G Codes:        Gladys Damme, PT, DPT  Acute Rehabilitation Services  Pager: [phone]   Lehman Prom 03/18/2017, 11:21 AM

## 2017-03-19 LAB — GLUCOSE, CAPILLARY: Glucose-Capillary: 100 mg/dL — ABNORMAL HIGH (ref 65–99)

## 2017-03-19 MED ORDER — ASPIRIN 81 MG PO TBEC: 81.0000 mg | DELAYED_RELEASE_TABLET | Freq: Every day | ORAL | Status: AC

## 2017-03-19 NOTE — Progress Notes (Signed)
Inpatient Rehabilitation  Insurance authorization has been initiated.  I met with patient to discuss team's recommendation for IP Rehab post acute care.  I shared booklets and answered questions.  Patient with noted improvements in processing with answering home set-up questions as compared to when I met her during her [facility] stay.  Patient eager to regain her independence and return to work.  Plan to follow for timing of medical readiness, insurance authorization, and IP Rehab bed availability.  Please call with questions.    , M.A., CCC/SLP Admission Coordinator  [facility]  Cell [phone]  

## 2017-03-19 NOTE — NC FL2 (Signed)
[place] MEDICAID FL2 LEVEL OF CARE SCREENING TOOL     IDENTIFICATION  Patient Name: Miranda [name] Birthdate: 1961/04/23 Sex: female Admission Date (Current Location): 03/14/2017  [facility] and [place] Number:  Chiropodist and Address:  [facility], [address] [place], [place] [address]      Provider Number: [id number]  Attending Physician Name and Address:  Antoine Poche, MD  Relative Name and Phone Number:       Current Level of Care: Hospital Recommended Level of Care: Skilled Nursing Facility Prior Approval Number:    Date Approved/Denied:   PASRR Number: [id number]  Discharge Plan: SNF    Current Diagnoses: Patient Active Problem List   Diagnosis Date Noted  . Generalized weakness   . AKI (acute kidney injury) (HCC) 03/14/2017  . Diarrhea 03/14/2017  . Bradycardia   . Cardiac arrest with ventricular fibrillation (HCC) 02/26/2017  . Cardiac arrest (HCC) 02/26/2017  . Acute respiratory failure (HCC)   . Hypokalemia   . Encounter for central line placement   . Cardiogenic shock (HCC)   . Acute pulmonary edema (HCC)   . Anoxic encephalopathy (HCC)   . Hypertension 08/16/2016  . Edema 08/16/2016  . Nausea 08/16/2016  . Class 2 obesity due to excess calories without serious comorbidity with body mass index (BMI) of 35.0 to 35.9 in adult 08/16/2016    Orientation RESPIRATION BLADDER Height & Weight     Self, Time, Situation, Place  Normal Continent Weight: 180 lb 9.6 oz (81.9 kg) (scale b) Height:  5' (152.4 cm)  BEHAVIORAL SYMPTOMS/MOOD NEUROLOGICAL BOWEL NUTRITION STATUS      Continent Diet  AMBULATORY STATUS COMMUNICATION OF NEEDS Skin   Limited Assist Verbally Surgical wounds                       Personal Care Assistance Level of Assistance  Bathing, Dressing Bathing Assistance: Limited assistance   Dressing Assistance: Limited assistance     Functional Limitations Info             SPECIAL CARE  FACTORS FREQUENCY  PT (By licensed PT), OT (By licensed OT)     PT Frequency: 5/wk OT Frequency: 5/wk            Contractures      Additional Factors Info  Code Status, Allergies, Psychotropic Code Status Info: FULL Allergies Info: Ace Inhibitors, Compazine Prochlorperazine Edisylate, Lisinopril, Penicillins Psychotropic Info: seroquel         Current Medications (03/19/2017):  This is the current hospital active medication list Current Facility-Administered Medications  Medication Dose Route Frequency Provider Last Rate Last Dose  . aspirin EC tablet 81 mg  81 mg Oral Daily Timoteo Expose T, MD   81 mg at 03/19/17 1043  . carvedilol (COREG) tablet 3.125 mg  3.125 mg Oral BID WC Timoteo Expose T, MD   3.125 mg at 03/19/17 1043  . Chlorhexidine Gluconate Cloth 2 % PADS 6 each  6 each Topical M8413 Antoine Poche, MD   6 each at 03/19/17 0546  . ipratropium-albuterol (DUONEB) 0.5-2.5 (3) MG/3ML nebulizer solution 3 mL  3 mL Nebulization Q6H PRN Antoine Poche, MD      . lipase/protease/amylase (CREON) capsule 72,000 Units  72,000 Units Oral TID [place] Levo Timoteo Expose T, MD   72,000 Units at 03/19/17 1043  . mupirocin ointment (BACTROBAN) 2 % 1 application  1 application Nasal BID Branch, Dorothe Pea, MD  1 application at 03/19/17 1043  . ondansetron (ZOFRAN) injection 4 mg  4 mg Intravenous Q6H PRN Duke Salvia, MD      . QUEtiapine (SEROQUEL) tablet 25 mg  25 mg Oral QHS Timoteo Expose T, MD   25 mg at 03/18/17 2231     Discharge Medications: Please see discharge summary for a list of discharge medications.  Relevant Imaging Results:  Relevant Lab Results:   Additional Information SS#: [id number]  Burna Sis, LCSW

## 2017-03-19 NOTE — Progress Notes (Addendum)
Patient is for discharge today; Melissa with CIR called to see if they will accept her to rehab today; awaiting callback; Alexis Goodell [phone]  9:36 am- CM talked to [facility] with CIR; awaiting for OT eval and once that is completed she will start the insurance authorizations for Inpt Rehab; [employer]

## 2017-03-19 NOTE — Evaluation (Signed)
Occupational Therapy Evaluation Patient Details Name: Miranda [name] MRN: [id number] DOB: 1960/11/24 Today's Date: 03/19/2017    History of Present Illness Pt is a 56 y/o female presenting to [place] [facility] secondary to cardiac arrest. Underwent Cardiac cath upon arrival to [facility] which indicated reduced EF. Failed initial extubation on 8/25, however was successfully extubated on 03/11/17. Transferred to [facility] and underwent ICD implant on 9/10. PMH includes CHF, HTN, and migraines.    Clinical Impression   PTA, pt was independent with ADL and functional mobility and working as a Engineer, civil (consulting). She currently presents with generalized weakness and decreased activity tolerance for ADL impacting her ability to complete ADL at PLOF. She currently requires min assist for dressing/bathing tasks and min guard assist for toilet transfers. She was able to sit at sink to complete multiple grooming and bathing tasks with min guard-min assist and multiple therapeutic rest breaks during each task. She would best benefit from CIR level therapies post-acute D/C to maximize return to independent PLOF. OT will continue to follow while admitted.       Follow Up Recommendations  CIR    Equipment Recommendations  3 in 1 bedside commode    Recommendations for Other Services Rehab consult     Precautions / Restrictions Precautions Precautions: Fall Restrictions Weight Bearing Restrictions: Yes LUE Weight Bearing: Non weight bearing Other Position/Activity Restrictions: s/p cath procedure precautions      Mobility Bed Mobility Overal bed mobility: Needs Assistance Bed Mobility: Sit to Supine       Sit to supine: Min guard;HOB elevated   General bed mobility comments: Min guard assist for safety and VC's to adhere to precautions.   Transfers Overall transfer level: Needs assistance Equipment used: Rolling walker (2 wheeled) (single UE support from RW) Transfers: Sit to/from Stand Sit to Stand: Min  guard         General transfer comment: Min guard assist for safety during ambulation.     Balance Overall balance assessment: Needs assistance Sitting-balance support: No upper extremity supported;Feet unsupported Sitting balance-Leahy Scale: Good     Standing balance support: Single extremity supported;During functional activity Standing balance-Leahy Scale: Poor Standing balance comment: Reliant on single UE support.                            ADL either performed or assessed with clinical judgement   ADL Overall ADL's : Needs assistance/impaired Eating/Feeding: Supervision/ safety;Sitting   Grooming: Min guard;Standing   Upper Body Bathing: Sitting;Minimal assistance   Lower Body Bathing: Minimal assistance;Sit to/from stand   Upper Body Dressing : Minimal assistance;Sitting   Lower Body Dressing: Minimal assistance;Sit to/from stand   Toilet Transfer: Min guard;RW;BSC   Toileting- Architect and Hygiene: Min guard;Sit to/from stand       Functional mobility during ADLs: Min guard;Rolling walker General ADL Comments: Pt demonstrating decreased activity tolerance for ADL impacting independence with ADL. She demonstrates goo dunderstanding of ICD precautions.      Vision Patient Visual Report: No change from baseline Vision Assessment?: No apparent visual deficits     Perception     Praxis      Pertinent Vitals/Pain Pain Assessment: No/denies pain     Hand Dominance Right   Extremity/Trunk Assessment Upper Extremity Assessment Upper Extremity Assessment: Generalized weakness   Lower Extremity Assessment Lower Extremity Assessment: Generalized weakness   Cervical / Trunk Assessment Cervical / Trunk Assessment: Normal   Communication Communication Communication: No difficulties  Cognition Arousal/Alertness: Awake/alert Behavior During Therapy: WFL for tasks assessed/performed Overall Cognitive Status: Within Functional  Limits for tasks assessed                                     General Comments       Exercises     Shoulder Instructions      Home Living Family/patient expects to be discharged to:: Private residence Living Arrangements: Spouse/significant other Available Help at Discharge: Family;Available 24 hours/day Type of Home: Apartment Home Access: Level entry     Home Layout: One level     Bathroom Shower/Tub: Chief Strategy Officer: Standard     Home Equipment: None          Prior Functioning/Environment Level of Independence: Independent        Comments: Indep with ADLs, household and community mobilization; working full-time (6 nights, 12-hour shifts, 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        OT Problem List: Decreased strength;Decreased range of motion;Decreased activity tolerance;Impaired balance (sitting and/or standing);Decreased safety awareness;Decreased knowledge of use of DME or AE;Decreased knowledge of precautions;Cardiopulmonary status limiting activity;Pain      OT Treatment/Interventions: Self-care/ADL training;Therapeutic exercise;Energy conservation;DME and/or AE instruction;Therapeutic activities;Patient/family education;Balance training    OT Goals(Current goals can be found in the care plan section) Acute Rehab OT Goals Patient Stated Goal: to get stronger and more independent OT Goal Formulation: With patient Time For Goal Achievement: 04/02/17 Potential to Achieve Goals: Good ADL Goals Pt Will Perform Grooming: sitting;with modified independence Pt Will Perform Upper Body Dressing: sitting;with modified independence Pt Will Perform Lower Body Dressing: with modified independence;sit to/from stand Pt Will Transfer to Toilet: with modified independence;ambulating;bedside commode Pt Will Perform Toileting - Clothing Manipulation and hygiene: with modified independence;sit to/from stand Additional ADL Goal #1: Pt  will demonstrate improved activity tolerance for ADL to complete 3 consecutive standing grooming tasks with no therapeutic rest breaks.   OT Frequency: Min 3X/week   Barriers to D/C:            Co-evaluation              AM-PAC PT "6 Clicks" Daily Activity     Outcome Measure Help from another person eating meals?: None Help from another person taking care of personal grooming?: A Little Help from another person toileting, which includes using toliet, bedpan, or urinal?: A Little Help from another person bathing (including washing, rinsing, drying)?: A Lot Help from another person to put on and taking off regular upper body clothing?: A Lot Help from another person to put on and taking off regular lower body clothing?: A Lot 6 Click Score: 16   End of Session Equipment Utilized During Treatment: Rolling walker (for single UE support ONLY)  Activity Tolerance: Patient tolerated treatment well Patient left: in bed;with call bell/phone within reach  OT Visit Diagnosis: Other abnormalities of gait and mobility (R26.89);Muscle weakness (generalized) (M62.81)                Time: 1423-9532 OT Time Calculation (min): 28 min Charges:  OT General Charges $OT Visit: 1 Visit OT Evaluation $OT Eval Moderate Complexity: 1 Mod OT Treatments $Self Care/Home Management : 8-22 mins G-Codes:     Doristine Section, MS OTR/L  Pager: [phone]   Ruba Outen A [employer] [place] 03/19/2017, 12:56 PM

## 2017-03-19 NOTE — Clinical Social Work Note (Signed)
Clinical Social Work Assessment  Patient Details  Name: Miranda [name] MRN: [id number] Date of Birth: 1961/05/25  Date of referral:  03/19/17               Reason for consult:  Facility Placement                Permission sought to share information with:  Facility Industrial/product designer granted to share information::  Yes, Verbal Permission Granted  Name::        Agency::  SNF  Relationship::     Contact Information:     Housing/Transportation Living arrangements for the past 2 months:  Skilled Nursing Facility Source of Information:  Patient Patient Interpreter Needed:  None Criminal Activity/Legal Involvement Pertinent to Current Situation/Hospitalization:  No - Comment as needed Significant Relationships:  Adult Children, Spouse Lives with:  Spouse Do you feel safe going back to the place where you live?  No Need for family participation in patient care:  No (Coment)  Care giving concerns: pt lives at home with spouse but does not think her current physical needs could be managed at home.   Social Worker assessment / plan:  CSW spoke with pt concerning PT recommendation for CIR and SNF as alternative rehab option.  Explained differences between CIR and SNF.  Employment status:  Technical brewer:  Managed Care PT Recommendations:  Inpatient Rehab Consult Information / Referral to community resources:  Skilled Nursing Facility  Patient/Family's Response to care:  Patient very hopeful for CIR and hates the idea of going to SNF for rehab.  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  CSW spoke with pt in depth about current needs and patient gave permission for CSW to fax out to see what options are just in case CIR cannot admit but she is not committed to SNF at this time.  Patient/Family's Understanding of and Emotional Response to Diagnosis, Current Treatment,  and Prognosis:  Patient seems to have good understanding of current condition and is realistic about how far she has left to go- hopeful she can get the highest level of care possible so she can recover quickly.  Emotional Assessment Appearance:  Appears stated age Attitude/Demeanor/Rapport:    Affect (typically observed):  Appropriate Orientation:  Oriented to Self, Oriented to Place, Oriented to  Time, Oriented to Situation Alcohol / Substance use:  Not Applicable Psych involvement (Current and /or in the community):  No (Comment)  Discharge Needs  Concerns to be addressed:  Care Coordination Readmission within the last 30 days:  No Current discharge risk:  Physical Impairment Barriers to Discharge:  Continued Medical Work up   Burna Sis, LCSW 03/19/2017, 3:11 PM

## 2017-03-19 NOTE — Discharge Summary (Signed)
ELECTROPHYSIOLOGY PROCEDURE DISCHARGE SUMMARY    Patient ID: Miranda [name],  MRN: [id number], DOB/AGE: 1961-05-21 56 y.o.  Admit date: 03/14/2017 Discharge date: 03/19/2017  Primary Care Physician: Merwyn Katos, MD Primary Cardiologist: Dr. Okey Dupre Electrophysiologist: Dr. Graciela Husbands  Primary Discharge Diagnosis:  1. Cardiac arrest 2. Diarrhea  Secondary Discharge Diagnosis:  1. HTN  Allergies  Allergen Reactions  . Ace Inhibitors   . Compazine [Prochlorperazine Edisylate]   . Lisinopril   . Penicillins     Has patient had a PCN reaction causing immediate rash, facial/tongue/throat swelling, SOB or lightheadedness with hypotension: Unknown Has patient had a PCN reaction causing severe rash involving mucus membranes or skin necrosis: Unknown Has patient had a PCN reaction that required hospitalization: Unknown Has patient had a PCN reaction occurring within the last 10 years: Unknown If all of the above answers are "NO", then may proceed with Cephalosporin use.      Procedures This Admission:  1. 02/26/17 LHC Conclusions: 1. No angiographically significant coronary artery disease. 2. Severely reduced left ventricular contraction (<25%) with moderately elevated left ventricular filling pressure (LVEDP 28 mmHg). Recommendations: 1. Hypothermia protocol per hospitalist and ICU teams. 2. Check magnesium level; replete electrolytes for goal potassium and magnesium greater than 4.0 and 2.0, respectively. 3. Consider gentle diuresis, given elevated LVEDP and severely reduced LVEF. 4. Obtain transthoracic echocardiogram 2.  Implantation of a MDT single chamber ICD on 03/17/17 by Dr Graciela Husbands.  The patient received a Medtronic MRI compatible 6935 single coil active fixation defibrillator lead, serial number [id number], Medtronic MRI compatible ICD, serial number [id number] There were no immediate post procedure complications. CXR on 03/18/17 demonstrated no pneumothorax status post device  implantation.   Brief HPI: Miranda [name] is a 56 y.o. female with HTN as her only known cardiac history, and no real medical history otherwise initially admitted to [facility] 02/26/17 after out of hospital cardiac arrest, eventually transferred to [facility] for cardiac MRI and ICD.  Hospital Course:  The patient's husband provided had diarrhea for the last couple of days and was taking almost 12 Imodium tablets every day. Yesterday around 9:30 PM she was video chatting with her daughter and around 9:45 PM she was sitting on the toilet, old and became unresponsive. Patient's husband did CPR and EMS arrived and patient was found to be in V. Fib. Patient was shocked twice by the EMS. Patient was intubated and put on ventilator and brought to the emergency room. Workup was done in the emergency room which showed low potassium level. Patient was seen by cardiology immediately in the emergency room and was taken to cardiac catheter lab and underwent cardiac catheterization. No blockage was found but the EF was found to be 15%. Intensivist team was consulted. Central line was placed by intensivist team. Hypothermia protocol was started. Her WBC count was elevated and she was started on IV cefepime antibiotic.  The patient was followed closely, electrolytes replaced, GI was brought on board imodium stopped, started on Creon, her diarrhea has remained resolved, and planned for out patient follow up EGD/colonoscopy, stool studies negative.  Bronch and resp cx were negative, completed abx 03/07/17, she was extubated 03/13/17, follow up TTE noted normalization of her EF, she transferred to [facility] 03/14/17.  Cardiac MRI was negative and she underwent ICD implant with details as outlined above. Shewas monitored on telemetry throughout her stay which demonstrated SR.  Left chest was without hematoma or ecchymosis.  The device was interrogated post implant day #1 and  found to be functioning normally.  CXR was obtained post implant day #1 and  demonstrated no pneumothorax status post device implantation.  Wound care, arm mobility, and restrictions were reviewed with the patient.  The patient was evaluated by PT and recommended CIR.  She was evaluated by CIR MD felt she qualified for CIR as well, with plans for today hopefully, when bed available.  The patient was examined by Dr. Graciela Husbands and considered stable for discharge to CIR. EP follow up has been arranged and in AVS system.    Supplemental Discharge Instructions for  Pacemaker/Defibrillator Patients  Activity No heavy lifting or vigorous activity with your left/right arm for 6 to 8 weeks.  Do not raise your left/right arm above your head for one week.  Gradually raise your affected arm as drawn below.             03/21/17                     03/22/17                    03/23/17                   03/24/17  NO DRIVING for 6 months  WOUND CARE - Keep the wound area clean and dry.  Do not get this area wet for one week. No showers for 24 hours; you may shower on  03/19/17. - The tape/steri-strips on your wound will fall off; do not pull them off.  No bandage is needed on the site.  DO  NOT apply any creams, oils, or ointments to the wound area. - If you notice any drainage or discharge from the wound, any swelling or bruising at the site, or you develop a fever > 101? F after you are discharged home, call the office at once.     Physical Exam: Vitals:   03/18/17 1710 03/18/17 2212 03/19/17 0602 03/19/17 0758  BP: 135/76 (!) 122/56 126/80 131/63  Pulse: 64 78 84 78  Resp: Temp: 98.6 F (37 C) 99.3 F (37.4 C) 98.6 F (37 C) 98.2 F (36.8 C)  TempSrc: Oral Oral Oral Oral  SpO2: 99% 96% 95% 97%  Weight:   180 lb 9.6 oz (81.9 kg)   Height:        GEN- The patient is well appearing, alert and oriented x 3 today.   HEENT: normocephalic, atraumatic; sclera clear, conjunctiva pink; hearing intact; oropharynx clear Lungs- CTA b/l, normal work of breathing.  No  wheezes, rales, rhonchi Heart- RRR, no murmurs, rubs or gallops, PMI not laterally displaced GI- soft, non-tender, non-distended Extremities- no clubbing, cyanosis, or edema MS- no significant deformity or atrophy, generalized decreased in functional capacity Skin- warm and dry, no rash or lesion, left chest without hematoma/ecchymosis Psych- euthymic mood, full affect Neuro- no gross defecits  Labs:   Lab Results  Component Value Date   WBC 5.2 03/15/2017   HGB 9.6 (L) 03/15/2017   HCT 30.4 (L) 03/15/2017   MCV 93.5 03/15/2017   PLT 282 03/15/2017     Recent Labs Lab 03/15/17 0637  NA 139  K 3.7  CL 107  CO2 23  BUN 22*  CREATININE 0.55  CALCIUM 8.5*  PROT 6.1*  BILITOT 1.1  ALKPHOS 72  ALT 67*  AST 37  GLUCOSE 106*    Discharge Medications:  Allergies as of 03/19/2017  Reactions   Ace Inhibitors    Compazine [prochlorperazine Edisylate]    Lisinopril    Penicillins    Has patient had a PCN reaction causing immediate rash, facial/tongue/throat swelling, SOB or lightheadedness with hypotension: Unknown Has patient had a PCN reaction causing severe rash involving mucus membranes or skin necrosis: Unknown Has patient had a PCN reaction that required hospitalization: Unknown Has patient had a PCN reaction occurring within the last 10 years: Unknown If all of the above answers are "NO", then may proceed with Cephalosporin use.      Medication List    TAKE these medications   aspirin 81 MG EC tablet Take 1 tablet (81 mg total) by mouth daily.   carvedilol 3.125 MG tablet Commonly known as:  COREG Take 1 tablet (3.125 mg total) by mouth 2 (two) times daily with a meal.   ipratropium-albuterol 0.5-2.5 (3) MG/3ML Soln Commonly known as:  DUONEB Take 3 mLs by nebulization every 6 (six) hours.   levalbuterol 1.25 MG/0.5ML nebulizer solution Commonly known as:  XOPENEX Take 1.25 mg by nebulization every 6 (six) hours as needed for wheezing or shortness of  breath.   lipase/protease/amylase 16109 UNITS Cpep capsule Commonly known as:  CREON Take 2 capsules (72,000 Units total) by mouth 3 (three) times daily before meals.   QUEtiapine 25 MG tablet Commonly known as:  SEROQUEL Take 1 tablet (25 mg total) by mouth at bedtime.   TYLENOL 500 MG tablet Generic drug:  acetaminophen Take 1,000 mg by mouth every 6 (six) hours as needed.            Discharge Care Instructions        Start     Ordered   03/19/17 0000  Increase activity slowly     03/19/17 0906   03/19/17 0000  Diet - low sodium heart healthy     03/19/17 0906   03/19/17 0000  aspirin EC 81 MG EC tablet  Daily    Question:  Supervising Provider  Answer:  Hillis Range   03/19/17 1041      Disposition:  CIR Discharge Instructions    Diet - low sodium heart healthy    Complete by:  As directed    Increase activity slowly    Complete by:  As directed      Follow-up Information    [facility] Office Follow up on 03/27/2017.   Specialty:  Cardiology Why:  4:00PM, wound check visit Contact information: [address] [place] [address] [phone]       Duke Salvia, MD Follow up on 06/19/2017.   Specialty:  Cardiology Why:  9:30AM Contact information: [address] [place] [address] [phone]        Wyline Mood, MD Follow up.   Specialty:  Surgery Why:  Please call for follow up once discharged from rehab Contact information: [address] [place] [address] [phone]        Merwyn Katos, MD Follow up.   Specialty:  Pulmonary Disease Why:  call for follow up once discharged from rehab Contact information: [address] [place] [address] [phone]           Duration of Discharge Encounter: Greater than 30 minutes including physician time.  Norma Fredrickson, PA-C 03/19/2017 10:41 AM

## 2017-03-19 NOTE — Progress Notes (Signed)
Inpatient Rehabilitation  Await OT evaluation prior to initiation of insurance authorization.  Acute therapy services has been contacted this morning to notify; requested evaluation be completed ASAP given that patient has been discharged.  Updated Steward Drone RN case manager.  Please call with questions.   Charlane Ferretti., CCC/SLP Admission Coordinator  [facility] Inpatient Rehabilitation  Cell [phone]

## 2017-03-19 NOTE — PMR Pre-admission (Signed)
PMR Admission Coordinator Pre-Admission Assessment  Patient: Miranda [name] is an 56 y.o., female MRN: [id number] DOB: [birth year] Height: 5' (152.4 cm) Weight: 81.9 kg (180 lb 9.6 oz) (scale b)              Insurance Information HMO:     PPO: X     PCP:      IPA:      80/20:      OTHER:  PRIMARY: BCBS State Health Plan      Policy#: [id number]      Subscriber: Self CM Name: Elnita Maxwell      Phone#: [phone]     Fax#: [phone] Pre-Cert#: [id number] for 03/20/17-04/03/17     Employer: Full Time Benefits:  Phone #: Verified Online     Name:  Eff. Date: 07/08/16     Deduct: $1250      Out of Pocket Max: $4350      Life Max: N/A CIR: $450 per admission then 80%/20%      SNF: 80%/20% Outpatient: PT/OT     Co-Pay: $52 per visit  Home Health: 80%      Co-Pay: 20% DME: 80%     Co-Pay: 20% Providers: In-network   SECONDARY: Maybe, patient checking       Policy#:       Subscriber:  CM Name:       Phone#:      Fax#:  Pre-Cert#:       Employer:  Benefits:  Phone #:      Name:  Eff. Date:      Deduct:       Out of Pocket Max:       Life Max:  CIR:       SNF:  Outpatient:      Co-Pay:  Home Health:       Co-Pay:  DME:      Co-Pay:   Medicaid Application Date:       Case Manager:  Disability Application Date:       Case Worker:   Emergency Contact Information Contact Information    Name Relation Home Work Mobile   [place] Spouse [phone]     Misty, Foutz   [phone]     Current Medical History  Patient Admitting Diagnosis: Debility, severe deconditioning after VF cardiac arrest   History of Present Illness: [name] a 55 y.o.right handed femalewith history of hypertension, migraine headaches. Per chart review patient lives with spouse. One level apartment. 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 Husband works during the day.Presented to [facility] 02/26/2017 after being found down by her husband in the bathroom unresponsive. Husband initiated CPR  and EMS delivered 2 shocks. Patient with VF arrest underwent urgent left heart catheterization showed nonobstructive CAD. Prior to admission patient had profuse watery diarrhea 3 days and was not able to stay hydrated and was taking multiple doses of Lomotil. Husband had reported she had not been taking her blood pressure medications. Potassium level 2.1 on admission, creatinine 1.26. CT of the chest showed bilateral lung opacities concerning for edema versus pneumonia. Cranial CT scan negative. Patient did require intubation. Echocardiogram with ejection fraction of less than 20% diffuse hypokinesis. Patient was transferred to [facility] 03/15/2017 with cardiac MRI completed that was negative. Underwent ICD placement 03/17/2017 and patient since extubated. Hospital course follow-up gastroenterology for bouts of diarrhea with conservative care and consider colonoscopy plus EGD as an outpatient. Hypokalemia has resolved latest potassium 3.7. MRSA PCR screen  positive. Placed on contact precautions. Physical therapy evaluation completed 03/18/2017 with recommendations of physical medicine and rehabilitation consult. Patient was admitted for a comprehensive rehabilitation program 03/20/17.      Past Medical History  Past Medical History:  Diagnosis Date  . Chronic systolic CHF (congestive heart failure) (HCC)    a. TTE 02/26/2017: EF < 20%, diffuse HK, mild AI, RV sys fxn mildly reduced; b. TTE 03/03/2017: EF 55-60%, nl WM, mild AI, PASP 46  . Coronary artery disease, non-occlusive    a. LHC 02/25/2017: no angiographically significant CAD, EF <35%, moderately elevated LVEDP at 28 mmHg  . Dependent edema    bilateral legs  . Hot flashes   . HTN (hypertension)   . Migraines   . Nausea   . Ventricular fibrillation (HCC)    a. 02/25/2017: LHC without signficant CAD; b. idiopathic; c. possibly 2/2 diarrhea with electrolyte abnormalities with Imodium usage    Family History  family history includes  Heart disease in her father; Hyperlipidemia in her father; Hypertension in her brother, father, and sister; Lymphoma in her mother; Migraines in her sister; Stroke in her father.  Prior Rehab/Hospitalizations:  Has the patient had major surgery during 100 days prior to admission? No  Current Medications   Current Facility-Administered Medications:  .  aspirin EC tablet 81 mg, 81 mg, Oral, Daily, Timoteo Expose T, MD, 81 mg at 03/19/17 1043 .  carvedilol (COREG) tablet 3.125 mg, 3.125 mg, Oral, BID WC, Timoteo Expose T, MD, 3.125 mg at 03/19/17 1043 .  Chlorhexidine Gluconate Cloth 2 % PADS 6 each, 6 each, Topical, Q0600, Antoine Poche, MD, 6 each at 03/19/17 0546 .  ipratropium-albuterol (DUONEB) 0.5-2.5 (3) MG/3ML nebulizer solution 3 mL, 3 mL, Nebulization, Q6H PRN, Branch, Dorothe Pea, MD .  lipase/protease/amylase (CREON) capsule 72,000 Units, 72,000 Units, Oral, TID Vernetta Honey T, MD, 72,000 Units at 03/19/17 1043 .  mupirocin ointment (BACTROBAN) 2 % 1 application, 1 application, Nasal, BID, Branch, Dorothe Pea, MD, 1 application at 03/19/17 1043 .  ondansetron (ZOFRAN) injection 4 mg, 4 mg, Intravenous, Q6H PRN, Duke Salvia, MD .  QUEtiapine (SEROQUEL) tablet 25 mg, 25 mg, Oral, QHS, Timoteo Expose T, MD, 25 mg at 03/18/17 2231  Patients Current Diet: Diet Heart Room service appropriate? Yes; Fluid consistency: Thin Diet - low sodium heart healthy  Precautions / Restrictions Precautions Precautions: Fall Restrictions Weight Bearing Restrictions: Yes LUE Weight Bearing: Non weight bearing Other Position/Activity Restrictions: s/p cath procedure precautions   Has the patient had 2 or more falls or a fall with injury in the past year?No  Prior Activity Level Community (5-7x/wk): Prior to admission patient was independent and active.  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    Home Assistive Devices / Equipment Home  Assistive Devices/Equipment: None Home Equipment: None  Prior Device Use: Indicate devices/aids used by the patient prior to current illness, exacerbation or injury? None of the above  Prior Functional Level Prior Function Level of Independence: Independent Comments: Indep with ADLs, household and community mobilization; working full-time (6 nights, 12-hour shifts, a week) at [employer] cath lab and [employer].  Self Care: Did the patient need help bathing, dressing, using the toilet or eating? Independent  Indoor Mobility: Did the patient need assistance with walking from room to room (with or without device)? Independent  Stairs: Did the patient need assistance with internal or external stairs (with or without device)?  Independent  Functional Cognition: Did the patient need help planning regular tasks such as shopping or remembering to take medications? Independent  Current Functional Level Cognition  Overall Cognitive Status: Within Functional Limits for tasks assessed Orientation Level: Oriented X4    Extremity Assessment (includes Sensation/Coordination)  Upper Extremity Assessment: Generalized weakness  Lower Extremity Assessment: Generalized weakness    ADLs  Overall ADL's : Needs assistance/impaired Eating/Feeding: Supervision/ safety, Sitting Grooming: Min guard, Standing Upper Body Bathing: Sitting, Minimal assistance Lower Body Bathing: Minimal assistance, Sit to/from stand Upper Body Dressing : Minimal assistance, Sitting Lower Body Dressing: Minimal assistance, Sit to/from stand Toilet Transfer: Min guard, RW, BSC Toileting- Clothing Manipulation and Hygiene: Min guard, Sit to/from stand Functional mobility during ADLs: Min guard, Rolling walker General ADL Comments: Pt demonstrating decreased activity tolerance for ADL impacting independence with ADL. She demonstrates goo dunderstanding of ICD precautions.     Mobility  Overal bed mobility: Needs Assistance Bed  Mobility: Sit to Supine Supine to sit: Min assist Sit to supine: Min guard, HOB elevated General bed mobility comments: Min guard assist for safety and VC's to adhere to precautions.     Transfers  Overall transfer level: Needs assistance Equipment used: Rolling walker (2 wheeled) (single UE support from RW) Transfers: Sit to/from Stand Sit to Stand: Min guard General transfer comment: Min guard assist for safety during ambulation.     Ambulation / Gait / Stairs / Wheelchair Mobility  Ambulation/Gait Ambulation/Gait assistance: Min assist, Mod assist Ambulation Distance (Feet): 15 Feet Assistive device: 1 person hand held assist Gait Pattern/deviations: Step-through pattern, Decreased stride length, Drifts right/left General Gait Details: Slow, unsteady gait and required min A up to mod A to maintain balance. Pt reports she has "sea legs" and feels fatigued after short ambulation distance. HR ranging from 90s to mid 100s.  Gait velocity: Decreased Gait velocity interpretation: Below normal speed for age/gender    Posture / Balance Balance Overall balance assessment: Needs assistance Sitting-balance support: No upper extremity supported, Feet unsupported Sitting balance-Leahy Scale: Good Standing balance support: Single extremity supported, During functional activity Standing balance-Leahy Scale: Poor Standing balance comment: Reliant on single UE support.     Special needs/care consideration BiPAP/CPAP: No CPM: No Continuous Drip IV: No Dialysis: No         Life Vest: No-ICD placed 03/17/17 Oxygen: No Special Bed: No Trach Size: No Wound Vac (area): No       Skin: WDL                              Location: Surgical incision to left, upper chest to monitor  Bowel mgmt: Continent 03/19/17 Bladder mgmt: Continent  Diabetic mgmt: No     Previous Home Environment Living Arrangements: Spouse/significant other Available Help at Discharge: Family, Available 24 hours/day Type of  Home: Apartment Home Layout: One level Home Access: Level entry Bathroom Shower/Tub: Engineer, manufacturing systems: Standard Home Care Services: No  Discharge Living Setting Plans for Discharge Living Setting: Patient's home, Apartment, Lives with (comment) (Spouse and Son) Type of Home at Discharge: Apartment Discharge Home Layout: One level Discharge Home Access: Stairs to enter Entrance Stairs-Rails: None Entrance Stairs-Number of Steps: 2 Discharge Bathroom Shower/Tub: Walk-in shower, Curtain Discharge Bathroom Toilet: Standard Discharge Bathroom Accessibility: Yes How Accessible: Accessible via walker Does the patient have any problems obtaining your medications?: No  Social/Family/Support Systems Patient Roles: Spouse, Parent Contact Information: Spouse: Shearon Stalls [phone] Anticipated  Caregiver: Spouse and Son Anticipated Industrial/product designer Information: see above Ability/Limitations of Caregiver: They work  Caregiver Availability: Intermittent Discharge Plan Discussed with Primary Caregiver: Yes Is Caregiver In Agreement with Plan?: Yes Does Caregiver/Family have Issues with Lodging/Transportation while Pt is in Rehab?: No  Goals/Additional Needs Patient/Family Goal for Rehab: PT/OT Mod I  Expected length of stay: 7-11 days  Cultural Considerations: Catholic  Dietary Needs: Heart Healthy diet restrictions  Equipment Needs: TBD Special Service Needs: None Additional Information: Patient aware no driving for 6 months per cardiology/[place] law  Pt/Family Agrees to Admission and willing to participate: Yes Program Orientation Provided & Reviewed with Pt/Caregiver Including Roles  & Responsibilities: Yes Additional Information Needs: None Information Needs to be Provided By: N/A  Decrease burden of Care through IP rehab admission: No  Possible need for SNF placement upon discharge: No  Patient Condition: This patient's condition remains as documented in  the consult dated 03/18/17 at 4:12 pm, in which the Rehabilitation Physician determined and documented that the patient's condition is appropriate for intensive rehabilitative care in an inpatient rehabilitation facility. Will admit to inpatient rehab today.  Preadmission Screen Completed By:  Fae Pippin, 03/19/2017 3:54 PM ______________________________________________________________________   Discussed status with Dr. Wynn Banker on 03/20/17 at 1010 and received telephone approval for admission today.  Admission Coordinator:  Fae Pippin, time 1010/Date 03/20/17

## 2017-03-19 NOTE — Progress Notes (Signed)
Physical Therapy Treatment Patient Details Name: Miranda [name] MRN: [id number] DOB: 1961/04/24 Today's Date: 03/19/2017    History of Present Illness Pt is a 56 y/o female presenting to  regional secondary to cardiac arrest. Underwent Cardiac cath upon arrival to [facility] which indicated reduced EF. Failed initial extubation on 8/25, however was successfully extubated on 03/11/17. Transferred to [facility] and underwent ICD implant on 9/10. PMH includes CHF, HTN, and migraines.     PT Comments    Patient is making progress toward mobility goals and demonstrated increased activity tolerance. Pt required min/mod A for ambulation and HR up to 107. Pt does continue to demonstrate balance and strength deficits increasing risk for falls. Current plan remains appropriate.    Follow Up Recommendations  CIR     Equipment Recommendations  Rolling walker with 5" wheels;3in1 (PT)    Recommendations for Other Services Rehab consult;OT consult     Precautions / Restrictions Precautions Precautions: Fall Restrictions Weight Bearing Restrictions: Yes LUE Weight Bearing: Non weight bearing Other Position/Activity Restrictions: s/p cath procedure precautions    Mobility  Bed Mobility Overal bed mobility: Modified Independent Bed Mobility: Supine to Sit           General bed mobility comments: increased time and effort; cues for technique  Transfers Overall transfer level: Needs assistance Equipment used: 1 person hand held assist Transfers: Sit to/from Stand Sit to Stand: Min guard         General transfer comment: HHA assist upon standing to steady  Ambulation/Gait Ambulation/Gait assistance: Min assist;Mod assist Ambulation Distance (Feet): 100 Feet Assistive device: 1 person hand held assist Gait Pattern/deviations: Step-through pattern;Decreased stride length;Drifts right/left;Scissoring Gait velocity: Decreased   General Gait Details: decreased cadence and unsteady with  scissoring noted with head turns; HR up to 107   Stairs            Wheelchair Mobility    Modified Rankin (Stroke Patients Only)       Balance Overall balance assessment: Needs assistance Sitting-balance support: No upper extremity supported;Feet unsupported Sitting balance-Leahy Scale: Good     Standing balance support: Single extremity supported;During functional activity Standing balance-Leahy Scale: Poor Standing balance comment: Reliant on single UE support.                             Cognition Arousal/Alertness: Awake/alert Behavior During Therapy: WFL for tasks assessed/performed Overall Cognitive Status: Within Functional Limits for tasks assessed                                 General Comments: Hoarse whisper/mouths words      Exercises      General Comments        Pertinent Vitals/Pain Pain Assessment: No/denies pain    Home Living                      Prior Function            PT Goals (current goals can now be found in the care plan section) Acute Rehab PT Goals PT Goal Formulation: With patient Time For Goal Achievement: 04/01/17 Potential to Achieve Goals: Good Progress towards PT goals: Progressing toward goals    Frequency    Min 3X/week      PT Plan Current plan remains appropriate    Co-evaluation  AM-PAC PT "6 Clicks" Daily Activity  Outcome Measure  Difficulty turning over in bed (including adjusting bedclothes, sheets and blankets)?: A Lot Difficulty moving from lying on back to sitting on the side of the bed? : A Lot Difficulty sitting down on and standing up from a chair with arms (e.g., wheelchair, bedside commode, etc,.)?: Unable Help needed moving to and from a bed to chair (including a wheelchair)?: A Little Help needed walking in hospital room?: A Lot Help needed climbing 3-5 steps with a railing? : A Lot 6 Click Score: 12    End of Session Equipment  Utilized During Treatment: Gait belt Activity Tolerance: Patient tolerated treatment well Patient left: in chair;with call bell/phone within reach Nurse Communication: Mobility status PT Visit Diagnosis: Muscle weakness (generalized) (M62.81);Other abnormalities of gait and mobility (R26.89);Unsteadiness on feet (R26.81)     Time: 1610-9604 PT Time Calculation (min) (ACUTE ONLY): 19 min  Charges:  $Gait Training: 8-22 mins                    G Codes:       Erline Levine, PTA Pager: [phone]     Carolynne Edouard 03/19/2017, 5:14 PM

## 2017-03-19 NOTE — Progress Notes (Signed)
Patient with no complaints or concerns during 7pm - 7am shift. Slept during the night. Stated that she feels good and rested this morning.   Chayil Gantt, RN

## 2017-03-20 ENCOUNTER — Inpatient Hospital Stay (HOSPITAL_COMMUNITY)
Admission: RE | Admit: 2017-03-20 | Discharge: 2017-03-28 | DRG: 945 | Disposition: A | Discharge status: Home Health | Payer: BC Managed Care – PPO | Source: Intra-hospital | Attending: Physical Medicine & Rehabilitation | Admitting: Physical Medicine & Rehabilitation

## 2017-03-20 ENCOUNTER — Encounter (HOSPITAL_COMMUNITY): Payer: Self-pay

## 2017-03-20 DIAGNOSIS — R531 Weakness: Secondary | ICD-10-CM | POA: Diagnosis present

## 2017-03-20 DIAGNOSIS — I110 Hypertensive heart disease with heart failure: Secondary | ICD-10-CM | POA: Diagnosis present

## 2017-03-20 DIAGNOSIS — I10 Essential (primary) hypertension: Secondary | ICD-10-CM

## 2017-03-20 DIAGNOSIS — Z95810 Presence of automatic (implantable) cardiac defibrillator: Secondary | ICD-10-CM

## 2017-03-20 DIAGNOSIS — Z22322 Carrier or suspected carrier of Methicillin resistant Staphylococcus aureus: Secondary | ICD-10-CM | POA: Diagnosis not present

## 2017-03-20 DIAGNOSIS — Z8674 Personal history of sudden cardiac arrest: Secondary | ICD-10-CM

## 2017-03-20 DIAGNOSIS — Z888 Allergy status to other drugs, medicaments and biological substances status: Secondary | ICD-10-CM | POA: Diagnosis not present

## 2017-03-20 DIAGNOSIS — I4901 Ventricular fibrillation: Secondary | ICD-10-CM | POA: Diagnosis not present

## 2017-03-20 DIAGNOSIS — I469 Cardiac arrest, cause unspecified: Secondary | ICD-10-CM

## 2017-03-20 DIAGNOSIS — Z880 Allergy status to penicillin: Secondary | ICD-10-CM | POA: Diagnosis not present

## 2017-03-20 DIAGNOSIS — E46 Unspecified protein-calorie malnutrition: Secondary | ICD-10-CM

## 2017-03-20 DIAGNOSIS — R197 Diarrhea, unspecified: Secondary | ICD-10-CM | POA: Diagnosis present

## 2017-03-20 DIAGNOSIS — D62 Acute posthemorrhagic anemia: Secondary | ICD-10-CM | POA: Diagnosis present

## 2017-03-20 DIAGNOSIS — Z8249 Family history of ischemic heart disease and other diseases of the circulatory system: Secondary | ICD-10-CM

## 2017-03-20 DIAGNOSIS — Z79899 Other long term (current) drug therapy: Secondary | ICD-10-CM | POA: Diagnosis not present

## 2017-03-20 DIAGNOSIS — I2510 Atherosclerotic heart disease of native coronary artery without angina pectoris: Secondary | ICD-10-CM | POA: Diagnosis present

## 2017-03-20 DIAGNOSIS — I5022 Chronic systolic (congestive) heart failure: Secondary | ICD-10-CM | POA: Diagnosis present

## 2017-03-20 DIAGNOSIS — E876 Hypokalemia: Secondary | ICD-10-CM | POA: Diagnosis present

## 2017-03-20 DIAGNOSIS — R5381 Other malaise: Secondary | ICD-10-CM

## 2017-03-20 LAB — GLUCOSE, CAPILLARY: Glucose-Capillary: 107 mg/dL — ABNORMAL HIGH (ref 65–99)

## 2017-03-20 MED ORDER — PANCRELIPASE (LIP-PROT-AMYL) 36000-114000 UNITS PO CPEP
72000.0000 [IU] | ORAL_CAPSULE | Freq: Three times a day (TID) | ORAL | Status: DC
  Administered 2017-03-21 – 2017-03-28 (×22): 72000 [IU] via ORAL
  Filled 2017-03-20 (×24): qty 2

## 2017-03-20 MED ORDER — CARVEDILOL 3.125 MG PO TABS
3.1250 mg | ORAL_TABLET | Freq: Two times a day (BID) | ORAL | Status: DC
  Administered 2017-03-21 – 2017-03-28 (×15): 3.125 mg via ORAL
  Filled 2017-03-20 (×15): qty 1

## 2017-03-20 MED ORDER — MUPIROCIN 2 % EX OINT
1.0000 "application " | TOPICAL_OINTMENT | Freq: Two times a day (BID) | CUTANEOUS | Status: AC
  Administered 2017-03-21 – 2017-03-22 (×4): 1 via NASAL
  Filled 2017-03-20: qty 22

## 2017-03-20 MED ORDER — IPRATROPIUM-ALBUTEROL 0.5-2.5 (3) MG/3ML IN SOLN: 3.0000 mL | Freq: Four times a day (QID) | RESPIRATORY_TRACT | Status: DC | PRN

## 2017-03-20 MED ORDER — QUETIAPINE FUMARATE 25 MG PO TABS
25.0000 mg | ORAL_TABLET | Freq: Every day | ORAL | Status: DC
  Administered 2017-03-20 – 2017-03-27 (×8): 25 mg via ORAL
  Filled 2017-03-20 (×8): qty 1

## 2017-03-20 MED ORDER — SORBITOL 70 % SOLN: 30.0000 mL | Freq: Every day | Status: DC | PRN

## 2017-03-20 MED ORDER — ASPIRIN EC 81 MG PO TBEC
81.0000 mg | DELAYED_RELEASE_TABLET | Freq: Every day | ORAL | Status: DC
  Administered 2017-03-21 – 2017-03-28 (×8): 81 mg via ORAL
  Filled 2017-03-20 (×8): qty 1

## 2017-03-20 MED ORDER — ONDANSETRON HCL 4 MG PO TABS: 4.0000 mg | ORAL_TABLET | Freq: Four times a day (QID) | ORAL | Status: DC | PRN

## 2017-03-20 MED ORDER — ONDANSETRON HCL 4 MG/2ML IJ SOLN: 4.0000 mg | Freq: Four times a day (QID) | INTRAMUSCULAR | Status: DC | PRN

## 2017-03-20 NOTE — Progress Notes (Signed)
Miranda [name] Rehab Admission Coordinator Signed Physical Medicine and Rehabilitation  PMR Pre-admission Date of Service: 03/19/2017 3:53 PM  Related encounter: Admission (Current) from 03/14/2017 in [facility] 3E CHF       [] Hide copied text PMR Admission Coordinator Pre-Admission Assessment  Patient: Miranda [name] is an 56 y.o., female MRN: [id number] DOB: 02-17-1961 Height: 5' (152.4 cm) Weight: 81.9 kg (180 lb 9.6 oz) (scale b)                                                                                                                                                  Insurance Information HMO:     PPO: X     PCP:      IPA:      80/20:      OTHER:  PRIMARY: BCBS State Health Plan      Policy#: [id number]      Subscriber: Self CM Name: Miranda [name]      Phone#: [phone]     Fax#: [phone] Pre-Cert#: [id number] for 03/20/17-04/03/17     Employer: Full Time Benefits:  Phone #: Verified Online     Name:  Eff. Date: 07/08/16     Deduct: $1250      Out of Pocket Max: $4350      Life Max: N/A CIR: $450 per admission then 80%/20%      SNF: 80%/20% Outpatient: PT/OT     Co-Pay: $52 per visit  Home Health: 80%      Co-Pay: 20% DME: 80%     Co-Pay: 20% Providers: In-network   SECONDARY: Maybe, patient checking       Policy#:       Subscriber:  CM Name:       Phone#:      Fax#:  Pre-Cert#:       Employer:  Benefits:  Phone #:      Name:  Eff. Date:      Deduct:       Out of Pocket Max:       Life Max:  CIR:       SNF:  Outpatient:      Co-Pay:  Home Health:       Co-Pay:  DME:      Co-Pay:   Medicaid Application Date:       Case Manager:  Disability Application Date:       Case Worker:   Emergency Contact Information        Contact Information    Name Relation Home Work Mobile   Miranda [name] Spouse [phone]     Miranda [name]   [phone]     Current Medical History  Patient Admitting Diagnosis: Debility, severe deconditioning after VF  cardiac arrest   History of Present Illness: Miranda [name] a 56 y.o.right handed femalewith history of hypertension, migraine headaches. Per chart review patient  lives with spouse. One level apartment. 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 Husband works during the day.Presented to [facility] 08/22/2018after being found down by her husband in the bathroom unresponsive. Husband initiated CPR and EMS delivered 2 shocks. Patient withVF arrest underwent urgent left heart catheterization showed nonobstructive CAD. Prior to admission patient hadprofusewatery diarrhea 3 days and wasnotable to stay hydratedand was taking multiple doses of Lomotil.Husband had reported she had not been taking her blood pressure medications.Potassium level 2.1 on admission, creatinine 1.26. CT of the chest showed bilateral lung opacities concerning for edema versus pneumonia. Cranial CT scan negative. Patient did require intubation. Echocardiogram with ejection fraction of less than 20% diffuse hypokinesis. Patient was transferred to [facility] 03/15/2017 with cardiac MRI completed that was negative. Underwent ICD placement 03/17/2017 and patient since extubated. Hospital course follow-up gastroenterology for bouts of diarrhea with conservative care and consider colonoscopy plus EGD as an outpatient. Hypokalemia has resolved latest potassium 3.7.MRSA PCR screen positive. Placed on contact precautions.Physical therapy evaluation completed 03/18/2017 with recommendations of physical medicine and rehabilitation consult.Patient was admitted for a comprehensive rehabilitation program 03/20/17.  Past Medical History      Past Medical History:  Diagnosis Date  . Chronic systolic CHF (congestive heart failure) (HCC)    a. TTE 02/26/2017: EF < 20%, diffuse HK, mild AI, RV sys fxn mildly reduced; b. TTE 03/03/2017: EF 55-60%, nl WM, mild AI, PASP 46  . Coronary artery disease, non-occlusive    a.  LHC 02/25/2017: no angiographically significant CAD, EF <35%, moderately elevated LVEDP at 28 mmHg  . Dependent edema    bilateral legs  . Hot flashes   . HTN (hypertension)   . Migraines   . Nausea   . Ventricular fibrillation (HCC)    a. 02/25/2017: LHC without signficant CAD; b. idiopathic; c. possibly 2/2 diarrhea with electrolyte abnormalities with Imodium usage    Family History  family history includes Heart disease in her father; Hyperlipidemia in her father; Hypertension in her brother, father, and sister; Lymphoma in her mother; Migraines in her sister; Stroke in her father.  Prior Rehab/Hospitalizations:  Has the patient had major surgery during 100 days prior to admission? No  Current Medications   Current Facility-Administered Medications:  .  aspirin EC tablet 81 mg, 81 mg, Oral, Daily, Timoteo Expose T, MD, 81 mg at 03/19/17 1043 .  carvedilol (COREG) tablet 3.125 mg, 3.125 mg, Oral, BID WC, Timoteo Expose T, MD, 3.125 mg at 03/19/17 1043 .  Chlorhexidine Gluconate Cloth 2 % PADS 6 each, 6 each, Topical, Q0600, Antoine Poche, MD, 6 each at 03/19/17 0546 .  ipratropium-albuterol (DUONEB) 0.5-2.5 (3) MG/3ML nebulizer solution 3 mL, 3 mL, Nebulization, Q6H PRN, Branch, Dorothe Pea, MD .  lipase/protease/amylase (CREON) capsule 72,000 Units, 72,000 Units, Oral, TID Vernetta Honey T, MD, 72,000 Units at 03/19/17 1043 .  mupirocin ointment (BACTROBAN) 2 % 1 application, 1 application, Nasal, BID, Branch, Dorothe Pea, MD, 1 application at 03/19/17 1043 .  ondansetron (ZOFRAN) injection 4 mg, 4 mg, Intravenous, Q6H PRN, Duke Salvia, MD .  QUEtiapine (SEROQUEL) tablet 25 mg, 25 mg, Oral, QHS, Timoteo Expose T, MD, 25 mg at 03/18/17 2231  Patients Current Diet: Diet Heart Room service appropriate? Yes; Fluid consistency: Thin Diet - low sodium heart healthy  Precautions / Restrictions Precautions Precautions: Fall Restrictions Weight Bearing  Restrictions: Yes LUE Weight Bearing: Non weight bearing Other Position/Activity Restrictions: s/p cath procedure precautions   Has  the patient had 2 or more falls or a fall with injury in the past year?No  Prior Activity Level Community (5-7x/wk): Prior to admission patient was independent and active.  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    Home Assistive Devices / Equipment Home Assistive Devices/Equipment: None Home Equipment: None  Prior Device Use: Indicate devices/aids used by the patient prior to current illness, exacerbation or injury? None of the above  Prior Functional Level Prior Function Level of Independence: Independent Comments: Indep with ADLs, household and community mobilization; working full-time (6 nights, 12-hour shifts, a week) at [employer] cath lab and [employer].  Self Care: Did the patient need help bathing, dressing, using the toilet or eating? Independent  Indoor Mobility: Did the patient need assistance with walking from room to room (with or without device)? Independent  Stairs: Did the patient need assistance with internal or external stairs (with or without device)? Independent  Functional Cognition: Did the patient need help planning regular tasks such as shopping or remembering to take medications? Independent  Current Functional Level Cognition  Overall Cognitive Status: Within Functional Limits for tasks assessed Orientation Level: Oriented X4    Extremity Assessment (includes Sensation/Coordination)  Upper Extremity Assessment: Generalized weakness  Lower Extremity Assessment: Generalized weakness    ADLs  Overall ADL's : Needs assistance/impaired Eating/Feeding: Supervision/ safety, Sitting Grooming: Min guard, Standing Upper Body Bathing: Sitting, Minimal assistance Lower Body Bathing: Minimal assistance, Sit to/from stand Upper Body Dressing : Minimal assistance,  Sitting Lower Body Dressing: Minimal assistance, Sit to/from stand Toilet Transfer: Min guard, RW, BSC Toileting- Clothing Manipulation and Hygiene: Min guard, Sit to/from stand Functional mobility during ADLs: Min guard, Rolling walker General ADL Comments: Pt demonstrating decreased activity tolerance for ADL impacting independence with ADL. She demonstrates goo dunderstanding of ICD precautions.     Mobility  Overal bed mobility: Needs Assistance Bed Mobility: Sit to Supine Supine to sit: Min assist Sit to supine: Min guard, HOB elevated General bed mobility comments: Min guard assist for safety and VC's to adhere to precautions.     Transfers  Overall transfer level: Needs assistance Equipment used: Rolling walker (2 wheeled) (single UE support from RW) Transfers: Sit to/from Stand Sit to Stand: Min guard General transfer comment: Min guard assist for safety during ambulation.     Ambulation / Gait / Stairs / Wheelchair Mobility  Ambulation/Gait Ambulation/Gait assistance: Min assist, Mod assist Ambulation Distance (Feet): 15 Feet Assistive device: 1 person hand held assist Gait Pattern/deviations: Step-through pattern, Decreased stride length, Drifts right/left General Gait Details: Slow, unsteady gait and required min A up to mod A to maintain balance. Pt reports she has "sea legs" and feels fatigued after short ambulation distance. HR ranging from 90s to mid 100s.  Gait velocity: Decreased Gait velocity interpretation: Below normal speed for age/gender    Posture / Balance Balance Overall balance assessment: Needs assistance Sitting-balance support: No upper extremity supported, Feet unsupported Sitting balance-Leahy Scale: Good Standing balance support: Single extremity supported, During functional activity Standing balance-Leahy Scale: Poor Standing balance comment: Reliant on single UE support.     Special needs/care consideration BiPAP/CPAP: No CPM:  No Continuous Drip IV: No Dialysis: No         Life Vest: No-ICD placed 03/17/17 Oxygen: No Special Bed: No Trach Size: No Wound Vac (area): No       Skin: WDL  Location: Surgical incision to left, upper chest to monitor  Bowel mgmt: Continent 03/19/17 Bladder mgmt: Continent  Diabetic mgmt: No     Previous Home Environment Living Arrangements: Spouse/significant other Available Help at Discharge: Family, Available 24 hours/day Type of Home: Apartment Home Layout: One level Home Access: Level entry Bathroom Shower/Tub: Engineer, manufacturing systems: Standard Home Care Services: No  Discharge Living Setting Plans for Discharge Living Setting: Patient's home, Apartment, Lives with (comment) (Spouse and Son) Type of Home at Discharge: Apartment Discharge Home Layout: One level Discharge Home Access: Stairs to enter Entrance Stairs-Rails: None Entrance Stairs-Number of Steps: 2 Discharge Bathroom Shower/Tub: Walk-in shower, Curtain Discharge Bathroom Toilet: Standard Discharge Bathroom Accessibility: Yes How Accessible: Accessible via walker Does the patient have any problems obtaining your medications?: No  Social/Family/Support Systems Patient Roles: Spouse, Parent Contact Information: Spouse: Shearon Stalls [phone] Anticipated Caregiver: Spouse and Son Anticipated Caregiver's Contact Information: see above Ability/Limitations of Caregiver: They work  Caregiver Availability: Intermittent Discharge Plan Discussed with Primary Caregiver: Yes Is Caregiver In Agreement with Plan?: Yes Does Caregiver/Family have Issues with Lodging/Transportation while Pt is in Rehab?: No  Goals/Additional Needs Patient/Family Goal for Rehab: PT/OT Mod I  Expected length of stay: 7-11 days  Cultural Considerations: Catholic  Dietary Needs: Heart Healthy diet restrictions  Equipment Needs: TBD Special Service Needs: None Additional Information:  Patient aware no driving for 6 months per cardiology/[place] law  Pt/Family Agrees to Admission and willing to participate: Yes Program Orientation Provided & Reviewed with Pt/Caregiver Including Roles  & Responsibilities: Yes Additional Information Needs: None Information Needs to be Provided By: N/A  Decrease burden of Care through IP rehab admission: No  Possible need for SNF placement upon discharge: No  Patient Condition: This patient's condition remains as documented in the consult dated 03/18/17 at 4:12 pm, in which the Rehabilitation Physician determined and documented that the patient's condition is appropriate for intensive rehabilitative care in an inpatient rehabilitation facility. Will admit to inpatient rehab today.  Preadmission Screen Completed By:  Miranda [name], 03/19/2017 3:54 PM ______________________________________________________________________   Discussed status with Dr. Wynn Banker on 03/20/17 at 1010 and received telephone approval for admission today.  Admission Coordinator:  Miranda [name], time 1010/Date 03/20/17       Cosigned by: Erick Colace, MD at 03/20/2017 11:33 AM  Revision History

## 2017-03-20 NOTE — Progress Notes (Signed)
Orders received for pt discharge.  Discharge summary printed and reviewed with pt.  Explained medication regimen, and pt had no further questions at this time.  IV removed and site remains clean, dry, intact.  Telemetry removed.  Pt in stable condition and awaiting transport. Report was given to receiving nurse and pt will be transported to CIR, .

## 2017-03-20 NOTE — Progress Notes (Signed)
To room 5 per wheelchair. Belongings with pt. Alert and Oriented. Voice hoarse but clear. Report had been received at1345 from Katrina. Four requests to bring pt, Consulting civil engineer notified.

## 2017-03-20 NOTE — Progress Notes (Signed)
Progress Note  Patient Name: Miranda [name] Date of Encounter: 03/20/2017  Primary Cardiologist: Dr Mariah Milling Primary EP:  Graciela Husbands  Subjective   Was OOB more yesterday ambulated some and enthusiastic about rehab, no CP, palpitations or SOB  Discharge was held yesterday only 2/2 waiting on insurance approval for CIR  Inpatient Medications    Scheduled Meds: . aspirin EC  81 mg Oral Daily  . carvedilol  3.125 mg Oral BID WC  . Chlorhexidine Gluconate Cloth  6 each Topical Q0600  . lipase/protease/amylase  72,000 Units Oral TID AC  . mupirocin ointment  1 application Nasal BID  . QUEtiapine  25 mg Oral QHS   Continuous Infusions:  PRN Meds: ipratropium-albuterol, ondansetron (ZOFRAN) IV   Vital Signs    Vitals:   03/19/17 0758 03/19/17 1141 03/19/17 1928 03/20/17 0547  BP: 131/63 124/82 (!) 108/41 (!) 112/56  Pulse: 78 80 88 73  Resp: Temp: 98.2 F (36.8 C) 98.6 F (37 C) 98.8 F (37.1 C) 97.7 F (36.5 C)  TempSrc: Oral Oral Oral Oral  SpO2: 97% 100% 100% 100%  Weight:    180 lb 14.4 oz (82.1 kg)  Height:        Intake/Output Summary (Last 24 hours) at 03/20/17 0927 Last data filed at 03/20/17 0606  Gross per 24 hour  Intake              395 ml  Output              350 ml  Net               45 ml   Filed Weights   03/18/17 0634 03/19/17 0602 03/20/17 0547  Weight: 181 lb 6.4 oz (82.3 kg) 180 lb 9.6 oz (81.9 kg) 180 lb 14.4 oz (82.1 kg)    Telemetry    remains SR 70's - Personally Reviewed  Physical Exam    Her exam remains unchanged GEN- The patient is overweight appearing, alert and oriented x 3 today, in NAD.   Head- normocephalic, atraumatic Eyes-  Sclera clear, conjunctiva pink Ears- hearing intact Oropharynx- clear Neck- supple Lungs- CTA b/l, normal work of breathing Heart- RRR, no gallops rubs, murmurs GI- soft, NT, ND Extremities- no clubbing, cyanosis, or edema MS- no significant deformity or atrophy Skin- no rash or  lesion Psych- euthymic mood, full affect Neuro- no gross deficits appreciated  Implant site is stable, no hematoma, bleeding   Labs    Chemistry  Recent Labs Lab 03/14/17 0420 03/15/17 0026 03/15/17 0637  NA 142  --  139  K 4.4  --  3.7  CL 111  --  107  CO2 25  --  23  GLUCOSE 113*  --  106*  BUN 25*  --  22*  CREATININE 0.50 0.56 0.55  CALCIUM 8.8*  --  8.5*  PROT  --   --  6.1*  ALBUMIN  --   --  2.8*  AST  --   --  37  ALT  --   --  67*  ALKPHOS  --   --  72  BILITOT  --   --  1.1  GFRNONAA >60 >60 >60  GFRAA >60 >60 >60  ANIONGAP 6  --  9     Hematology  Recent Labs Lab 03/14/17 0420 03/15/17 0026 03/15/17 0637  WBC 7.5 6.5 5.2  RBC 3.35* 3.24* 3.25*  HGB 10.4* 9.8* 9.6*  HCT 30.3* 30.4*  30.4*  MCV 90.4 93.8 93.5  MCH 31.0 30.2 29.5  MCHC 34.3 32.2 31.6  RDW 14.0 15.2 14.8  PLT 326 184 282    Cardiac Enzymes  Recent Labs Lab 03/15/17 0026 03/15/17 0637 03/15/17 1141  TROPONINI 0.08* 0.08* 0.09*   No results for input(s): TROPIPOC in the last 168 hours.   TTE 03/03/2017: Study Conclusions - Left ventricle: The cavity size was normal. There was mild concentric hypertrophy. Systolic function was normal. The estimated ejection fraction was in the range of 55% to 60%. Wall motion was normal; there were no regional wall motion abnormalities. - Aortic valve: There was mild regurgitation. - Pulmonary arteries: Systolic pressure was mildly to moderately increased. PA peak pressure: 46 mm Hg (S). Impressions: - Limited echo to evaluate ejection fraction. When compared to recent echo, LV systolic function normalized completely with improvement in ejection fraction of from 10% to 55%.  LHC 02/26/2017: Conclusion  Conclusions: 1. No angiographically significant coronary artery disease. 2. Severely reduced left ventricular contraction (<25%) with moderately elevated left ventricular filling pressure (LVEDP 28  mmHg).  Recommendations: 1. Hypothermia protocol per hospitalist and ICU teams. 2. Check magnesium level; replete electrolytes for goal potassium and magnesium greater than 4.0 and 2.0, respectively. 3. Consider gentle diuresis, given elevated LVEDP and severely reduced LVEF. 4. Obtain transthoracic echocardiogram   TTE 02/26/2017: Study Conclusions - Left ventricle: The cavity size was mildly dilated. Systolic function was severely reduced. The estimated ejection fraction was <20%. Diffuse hypokinesis. Regional wall motion abnormalities cannot be excluded. The study is not technically sufficient to allow evaluation of LV diastolic function. - Mitral valve: There was mild regurgitation. - Right ventricle: Systolic function was mildly reduced. - Pulmonary arteries: Systolic pressure could not be accurately estimated. - Inferior vena cava: The vessel was normal in size. The respirophasic diameter changes were in the normal range (>= 50%), consistent with normal central venous pressure.    Patient Profile     56 y.o. female with no prior cardiac history who is admitted with ventricular fibrillation arrest, now with ROSC.  She is transferred by Dr Graciela Husbands from [facility] to [facility] for cardiac MRI and consideration for ICD.  Assessment & Plan    1. Cardiac arrest S/p cardiogenic shock, hypothermia protocol, intubation, ARF She has made meaningful recovery Arrest requiring defibrillation in the setting of low K and imodium/ diarrhea  S/p Cardiac MRI negative S/p ICD implant 03/18/17 by Dr. Graciela Husbands Device interrogation post implant day #1 noted stable measurements/function CXR post implant ay #1 without ptx Wound care and activity restrictions were discussed with the patient The patient is aware of [place] law, no driving 6 months post arrest event  Patient has been approved for CIR by her insurance, will discharged yesterday though insurance approval held this up, she will  discharge today   2. Acute systolic CHF: with interval resolution of LVEF  Confirmed by MRI >> normal EF and no Gad enhancement -Severely reduced LVEF by LV gram and by echocardiogram August 22EF of 10-15% with global hypokinesis -Repeat echo 8/27 showed improved EF to 55-60%, no RWMA -initially treated with milrinone, discontinued and on BB now Her exam remains compensated   3. Diarrhea: -Remains resolved -Seen by GI, outpatient EGD/colonoscopy   4. Possible multilobar PNA resolveved  -S/p bronch -Culture negative -Off ABX as of 8/31  5. Functional decline     PT/OT recommends CIR     Rehab admissions coordinator is following     MD  has evaluated and acceptable for CIR     Approved for CIR by insurance   Francis Dowse, PA-C 03/20/2017 9:27 AM  Seen and examined  Of for discharge to rehab

## 2017-03-20 NOTE — Progress Notes (Signed)
Inpatient Rehabilitation  I have received insurance authorization and have an IP Rehab bed available to offer patient today.  Plan to proceed with admission and have updated Reene PA and Steward Drone nurse case Production designer, theatre/television/film.  Please call if questions.   Charlane Ferretti., CCC/SLP Admission Coordinator  [facility] Inpatient Rehabilitation  Cell [phone]

## 2017-03-20 NOTE — Progress Notes (Signed)
Ranelle Oyster, MD Physician Signed Physical Medicine and Rehabilitation  Consult Note Date of Service: 03/18/2017 1:05 PM  Related encounter: Admission (Current) from 03/14/2017 in [facility] 3E CHF     Expand All Collapse All   [] Hide copied text [] Hover for attribution information      Physical Medicine and Rehabilitation Consult Reason for Consult: Decreased functional mobility Referring Physician: Dr. Wyline Mood   HPI: Miranda [name] is a 56 y.o. right handed female with history of hypertension, migraine headaches. Per chart review patient lives with spouse. One level apartment. 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 Husband works during the day. Presented to [facility] 02/26/2017 with VF arrest underwent urgent left heart catheterization showed nonobstructive CAD. Prior to admission patient had watery diarrhea 3 days and was able to stay hydrated. Potassium level 2.1 on admission, creatinine 1.26. CT of the chest showed bilateral lung opacities concerning for edema versus pneumonia. Cranial CT scan negative. Patient did require intubation. Echocardiogram with ejection fraction of less than 20% diffuse hypokinesis. Patient was transferred to [facility] 03/15/2017 with cardiac MRI completed that was negative. Underwent ICD placement 03/17/2017 and patient since extubated. Hospital course follow-up gastroenterology for bouts of diarrhea with conservative care and consider colonoscopy plus EGD as an outpatient. Hypokalemia has resolved latest potassium 3.7. Physical therapy evaluation completed 03/18/2017 with recommendations of physical medicine and rehabilitation consult.   Review of Systems  Constitutional: Negative for chills and fever.  HENT: Negative for hearing loss.   Eyes: Negative for blurred vision and double vision.  Respiratory: Positive for shortness of breath. Negative for cough.   Cardiovascular: Negative for chest pain and  palpitations.  Gastrointestinal: Positive for diarrhea and nausea.  Genitourinary: Negative for dysuria, flank pain and hematuria.  Skin: Negative for rash.  Neurological: Positive for headaches. Negative for seizures and weakness.  All other systems reviewed and are negative.      Past Medical History:  Diagnosis Date  . Chronic systolic CHF (congestive heart failure) (HCC)    a. TTE 02/26/2017: EF < 20%, diffuse HK, mild AI, RV sys fxn mildly reduced; b. TTE 03/03/2017: EF 55-60%, nl WM, mild AI, PASP 46  . Coronary artery disease, non-occlusive    a. LHC 02/25/2017: no angiographically significant CAD, EF <35%, moderately elevated LVEDP at 28 mmHg  . Dependent edema    bilateral legs  . Hot flashes   . HTN (hypertension)   . Migraines   . Nausea   . Ventricular fibrillation (HCC)    a. 02/25/2017: LHC without signficant CAD; b. idiopathic; c. possibly 2/2 diarrhea with electrolyte abnormalities with Imodium usage   Past Surgical History:  Procedure Laterality Date  . APPENDECTOMY  1974  . CHOLECYSTECTOMY  1984  . ICD IMPLANT N/A 03/17/2017   Procedure: ICD Implant;  Surgeon: Duke Salvia, MD;  Location: [facility] INVASIVE CV LAB;  Service: Cardiovascular;  Laterality: N/A;  . LEFT HEART CATH AND CORONARY ANGIOGRAPHY N/A 02/26/2017   Procedure: LEFT HEART CATH AND CORONARY ANGIOGRAPHY;  Surgeon: Yvonne Kendall, MD;  Location: [facility] INVASIVE CV LAB;  Service: Cardiovascular;  Laterality: N/A;  . TONSILLECTOMY  1964        Family History  Problem Relation Age of Onset  . Lymphoma Mother   . Heart disease Father   . Stroke Father   . Hyperlipidemia Father   . Hypertension Father   . Hypertension Sister   . Migraines Sister   . Hypertension Brother  Social History:  reports that she has never smoked. She has never used smokeless tobacco. She reports that she does not drink alcohol or use drugs. Allergies:       Allergies  Allergen Reactions  . Ace  Inhibitors   . Compazine [Prochlorperazine Edisylate]   . Lisinopril   . Penicillins     Has patient had a PCN reaction causing immediate rash, facial/tongue/throat swelling, SOB or lightheadedness with hypotension: Unknown Has patient had a PCN reaction causing severe rash involving mucus membranes or skin necrosis: Unknown Has patient had a PCN reaction that required hospitalization: Unknown Has patient had a PCN reaction occurring within the last 10 years: Unknown If all of the above answers are "NO", then may proceed with Cephalosporin use.          Medications Prior to Admission  Medication Sig Dispense Refill  . acetaminophen (TYLENOL) 500 MG tablet Take 1,000 mg by mouth every 6 (six) hours as needed.    . carvedilol (COREG) 3.125 MG tablet Take 1 tablet (3.125 mg total) by mouth 2 (two) times daily with a meal. 30 tablet 0  . ipratropium-albuterol (DUONEB) 0.5-2.5 (3) MG/3ML SOLN Take 3 mLs by nebulization every 6 (six) hours. 360 mL 0  . levalbuterol (XOPENEX) 1.25 MG/0.5ML nebulizer solution Take 1.25 mg by nebulization every 6 (six) hours as needed for wheezing or shortness of breath. 1 each 12  . lipase/protease/amylase (CREON) 36000 UNITS CPEP capsule Take 2 capsules (72,000 Units total) by mouth 3 (three) times daily before meals. 10 capsule 0  . QUEtiapine (SEROQUEL) 25 MG tablet Take 1 tablet (25 mg total) by mouth at bedtime. 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 Functional Status:  Mobility: Bed Mobility Overal bed mobility: Needs Assistance Bed Mobility: Supine to Sit Supine to sit: Min assist General bed mobility comments: Min A for trunk elevation. Required use of elevated HOB. Verbal cues to maintain precautions on LUE.  Transfers Overall transfer level: Needs assistance Equipment used: 1 person hand held assist Transfers: Sit to/from Stand Sit to Stand: Min assist General transfer comment: Min A for lift assist and steadying upon standing. Verbal cues for maintenance of precautions on LUE.  Ambulation/Gait Ambulation/Gait assistance: Min assist, Mod assist Ambulation Distance (Feet): 15 Feet Assistive device: 1 person hand held assist Gait Pattern/deviations: Step-through pattern, Decreased stride length, Drifts right/left General Gait Details: Slow, unsteady gait and required min A up to mod A to maintain balance. Pt reports she has "sea legs" and feels fatigued after short ambulation distance. HR ranging from 90s to mid 100s.  Gait velocity: Decreased Gait velocity interpretation: Below normal speed for age/gender  ADL:  Cognition: Cognition Overall Cognitive Status: Within Functional Limits for tasks assessed Orientation Level: Oriented X4 Cognition Arousal/Alertness: Awake/alert Behavior During Therapy: WFL for tasks assessed/performed Overall Cognitive Status: Within Functional Limits for tasks assessed  Blood pressure (!) 142/74, pulse 76, temperature 98.5 F (36.9 C), temperature source Oral, resp. rate 14, height 5' (1.524 m), weight 82.3 kg (181 lb 6.4 oz), SpO2 98 %. Physical  Exam  Constitutional: She is oriented to person, place, and time.  HENT:  Head: Normocephalic.  Eyes: EOM are normal.  Neck: Normal range of motion. Neck supple. No thyromegaly present.  Cardiovascular:  Cardiac rate controlled  Respiratory: Effort normal and breath sounds normal.  GI: Soft.  Bowel sounds are normal. She exhibits no distension.  Neurological: She is alert and oriented to person, place, and time.  Skin: Skin is warm and dry.    Lab Results Last 24 Hours       Results for orders placed or performed during the hospital encounter of 03/14/17 (from the past 24 hour(s))  Glucose, capillary     Status: Abnormal   Collection Time: 03/18/17  6:30 AM  Result Value Ref Range   Glucose-Capillary 107 (H) 65 - 99 mg/dL   Comment 1 Notify RN    Comment 2 Document in Chart       Imaging Results (Last 48 hours)  Dg Chest 2 View  Result Date: 03/18/2017 CLINICAL DATA:  Permanent pacemaker placement yesterday EXAM: CHEST  2 VIEW COMPARISON:  Chest x-ray of March 10, 2017 FINDINGS: There has been placement of a pacemaker generator over the left pectoral region with the single electrode extending along the left subclavian approach. The electrode terminates appropriately in the region of the right ventricular apex. There is no postprocedure pneumothorax. The lungs are well-expanded. The interstitial markings remain mildly increased. The heart is top-normal in size. The pulmonary vascularity is not engorged. IMPRESSION: There is no postprocedure complication following permanent pacemaker placement. Electronically Signed   By: David  [place] M.D.   On: 03/18/2017 07:22   Mr Cardiac Morphology W Wo Contrast  Result Date: 03/17/2017 CLINICAL DATA:  Cardiac Arrest EXAM: CARDIAC MRI TECHNIQUE: The patient was scanned on a 1.5 Tesla GE magnet. A dedicated cardiac coil was used. Functional imaging was done using Fiesta sequences. 2,3, and 4 chamber views were done to assess for RWMA's. Modified Simpson's rule using a short axis stack was used to calculate an ejection fraction on a dedicated work Research officer, trade union. The patient received 27 cc of Multihance. After 10 minutes inversion recovery sequences were used to assess for infiltration and scar tissue. CONTRAST:  27  cc Multihance FINDINGS: All 4 cardiac chambers were normal in size and function. There was no ASD/VSD. There was a trivial mostly posterior pericardial effusion. There were no RV diverticula or areas of hypokinesis. The LV was mildly hypertrophied at 12 mm. There were no RWMAls The quantitative EF was 70% (EDV 89 cc ESV 27 cc SV 62 cc) Delayed enhancement images with gadolinium showed mild mid myocardial uptake in the distal anterior wall and inferior base IMPRESSION: 1) Normal LV size mild LVH septal thickness 12 mm EF 70% 2) Mild mid epicardial gadolinium uptake in distal anterior wall and inferior base 3) Normal RV size and function 4) Trivial pericardial effusion Charlton Haws Electronically Signed   By: Charlton Haws M.D.   On: 03/17/2017 10:51     Assessment/Plan: Diagnosis: debility, severe deconditioning after VF cardiac arrest 1. Does the need for close, 24 hr/day medical supervision in concert with the patient's rehab needs make it unreasonable for this patient to be served in a less intensive setting? Yes 2. Co-Morbidities requiring supervision/potential complications: HTN, diarrhea, pneumonia 3. Due to bladder management, bowel management, safety, skin/wound care, disease management, medication administration, pain management and patient education, does the patient require 24 hr/day rehab nursing? Yes 4. Does the patient require  coordinated care of a physician, rehab nurse, PT (1-2 hrs/day, 5 days/week) and OT (1-2 hrs/day, 5 days/week) to address physical and functional deficits in the context of the above medical diagnosis(es)? Yes Addressing deficits in the following areas: balance, endurance, locomotion, strength, transferring, bowel/bladder control, bathing, dressing, feeding, grooming, toileting and psychosocial support 5. Can the patient actively participate in an intensive therapy program of at least 3 hrs of therapy per day at least 5 days per week? Yes 6. The potential for patient  to make measurable gains while on inpatient rehab is excellent 7. Anticipated functional outcomes upon discharge from inpatient rehab are modified independent  with PT, modified independent with OT, n/a with SLP. 8. Estimated rehab length of stay to reach the above functional goals is: 7-11 days 9. Anticipated D/C setting: Home 10. Anticipated post D/C treatments: HH therapy and Outpatient therapy 11. Overall Rehab/Functional Prognosis: excellent  RECOMMENDATIONS: This patient's condition is appropriate for continued rehabilitative care in the following setting: CIR Patient has agreed to participate in recommended program. Yes Note that insurance prior authorization may be required for reimbursement for recommended care.  Comment: Rehab Admissions Coordinator to follow up.  Thanks,  Ranelle Oyster, MD, [place] Dom    Charlton Amor., PA-C 03/18/2017    Revision History                        Routing History

## 2017-03-20 NOTE — H&P (Signed)
Physical Medicine and Rehabilitation Admission H&P     Chief complaint: Weakness   HPI: Miranda [name] is a 56 y.o. right handed female with history of hypertension, migraine headaches. Per chart review patient lives with spouse. One level apartment. 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 Husband works during the day. Presented to [facility] 02/26/2017 after being found down by her husband in the bathroom unresponsive. Husband initiated CPR and EMS delivered 2 shocks. Patient with VF arrest underwent urgent left heart catheterization showed nonobstructive CAD. Prior to admission patient had profuse watery diarrhea 3 days and was not able to stay hydrated and was taking multiple doses of Lomotil. Husband had reported she had not been taking her blood pressure medications. Potassium level 2.1 on admission, creatinine 1.26. CT of the chest showed bilateral lung opacities concerning for edema versus pneumonia. Cranial CT scan negative. Patient did require intubation. Echocardiogram with ejection fraction of less than 20% diffuse hypokinesis. Patient was transferred to [facility] 03/15/2017 with cardiac MRI completed that was negative. Underwent ICD placement 03/17/2017 and patient since extubated. Hospital course follow-up gastroenterology for bouts of diarrhea with conservative care and consider colonoscopy plus EGD as an outpatient. Hypokalemia has resolved latest potassium 3.7. MRSA PCR screen positive. Placed on contact precautions Physical therapy evaluation completed 03/18/2017 with recommendations of physical medicine and rehabilitation consult. Patient was admitted for a comprehensive rehabilitation program  Patient does not remember certain parts of her hospitalization such as when she was intubated, she remembers a trip to the beach prior to her [facility] admission   Review of Systems  Constitutional: Negative for chills and fever.  HENT: Negative for hearing loss.   Eyes:  Negative for blurred vision.  Respiratory: Positive for shortness of breath.   Cardiovascular: Positive for leg swelling. Negative for chest pain and palpitations.  Gastrointestinal: Positive for abdominal pain and nausea. Negative for blood in stool.  Genitourinary: Negative for dysuria, hematuria and urgency.  Musculoskeletal: Positive for myalgias.  Skin: Negative for rash.  Neurological: Positive for weakness and headaches.  All other systems reviewed and are negative.       Past Medical History:  Diagnosis Date  . Chronic systolic CHF (congestive heart failure) (HCC)      a. TTE 02/26/2017: EF < 20%, diffuse HK, mild AI, RV sys fxn mildly reduced; b. TTE 03/03/2017: EF 55-60%, nl WM, mild AI, PASP 46  . Coronary artery disease, non-occlusive      a. LHC 02/25/2017: no angiographically significant CAD, EF <35%, moderately elevated LVEDP at 28 mmHg  . Dependent edema      bilateral legs  . Hot flashes    . HTN (hypertension)    . Migraines    . Nausea    . Ventricular fibrillation (HCC)      a. 02/25/2017: LHC without signficant CAD; b. idiopathic; c. possibly 2/2 diarrhea with electrolyte abnormalities with Imodium usage         Past Surgical History:  Procedure Laterality Date  . APPENDECTOMY   1974  . CHOLECYSTECTOMY   1984  . ICD IMPLANT N/A 03/17/2017    Procedure: ICD Implant;  Surgeon: Duke Salvia, MD;  Location: [facility] INVASIVE CV LAB;  Service: Cardiovascular;  Laterality: N/A;  . LEFT HEART CATH AND CORONARY ANGIOGRAPHY N/A 02/26/2017    Procedure: LEFT HEART CATH AND CORONARY ANGIOGRAPHY;  Surgeon: Yvonne Kendall, MD;  Location: [facility] INVASIVE CV LAB;  Service: Cardiovascular;  Laterality: N/A;  . TONSILLECTOMY  1964         Family History  Problem Relation Age of Onset  . Lymphoma Mother    . Heart disease Father    . Stroke Father    . Hyperlipidemia Father    . Hypertension Father    . Hypertension Sister    . Migraines Sister    . Hypertension Brother       Social History:  reports that she has never smoked. She has never used smokeless tobacco. She reports that she does not drink alcohol or use drugs. Allergies:       Allergies  Allergen Reactions  . Ace Inhibitors    . Compazine [Prochlorperazine Edisylate]    . Lisinopril    . Penicillins        Has patient had a PCN reaction causing immediate rash, facial/tongue/throat swelling, SOB or lightheadedness with hypotension: Unknown Has patient had a PCN reaction causing severe rash involving mucus membranes or skin necrosis: Unknown Has patient had a PCN reaction that required hospitalization: Unknown Has patient had a PCN reaction occurring within the last 10 years: Unknown If all of the above answers are "NO", then may proceed with Cephalosporin use.            Medications Prior to Admission  Medication Sig Dispense Refill  . acetaminophen (TYLENOL) 500 MG tablet Take 1,000 mg by mouth every 6 (six) hours as needed.      . carvedilol (COREG) 3.125 MG tablet Take 1 tablet (3.125 mg total) by mouth 2 (two) times daily with a meal. 30 tablet 0  . ipratropium-albuterol (DUONEB) 0.5-2.5 (3) MG/3ML SOLN Take 3 mLs by nebulization every 6 (six) hours. 360 mL 0  . levalbuterol (XOPENEX) 1.25 MG/0.5ML nebulizer solution Take 1.25 mg by nebulization every 6 (six) hours as needed for wheezing or shortness of breath. 1 each 12  . lipase/protease/amylase (CREON) 36000 UNITS CPEP capsule Take 2 capsules (72,000 Units total) by mouth 3 (three) times daily before meals. 10 capsule 0  . QUEtiapine (SEROQUEL) 25 MG tablet Take 1 tablet (25 mg total) by mouth at bedtime. 20 tablet 0      Drug Regimen Review  Drug regimen was reviewed and remains appropriate with no significant issues identified   Home: Home Living Family/patient expects to be discharged to:: Private residence Living Arrangements: Spouse/significant other Available Help at Discharge: Family, Available 24 hours/day Type of Home:  Apartment Home Access: Level entry Home Layout: One level Bathroom Shower/Tub: Engineer, manufacturing systems: Standard Home Equipment: None   Functional History: Prior Function Level of Independence: Independent Comments: Indep with ADLs, household and community mobilization; working full-time (6 nights, 12-hour shifts, 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   Functional Status:  Mobility: Bed Mobility Overal bed mobility: Needs Assistance Bed Mobility: Supine to Sit Supine to sit: Min assist General bed mobility comments: Min A for trunk elevation. Required use of elevated HOB. Verbal cues to maintain precautions on LUE.  Transfers Overall transfer level: Needs assistance Equipment used: 1 person hand held assist Transfers: Sit to/from Stand Sit to Stand: Min assist General transfer comment: Min A for lift assist and steadying upon standing. Verbal cues for maintenance of precautions on LUE.  Ambulation/Gait Ambulation/Gait assistance: Min assist, Mod assist Ambulation Distance (Feet): 15 Feet Assistive device: 1 person hand held assist Gait Pattern/deviations: Step-through pattern, Decreased stride length, Drifts right/left General Gait Details: Slow, unsteady gait and required min A up to mod A to maintain balance.  Pt reports she has "sea legs" and feels fatigued after short ambulation distance. HR ranging from 90s to mid 100s.  Gait velocity: Decreased Gait velocity interpretation: Below normal speed for age/gender   ADL:   Cognition: Cognition Overall Cognitive Status: Within Functional Limits for tasks assessed Orientation Level: Oriented X4 Cognition Arousal/Alertness: Awake/alert Behavior During Therapy: WFL for tasks assessed/performed Overall Cognitive Status: Within Functional Limits for tasks assessed   Physical Exam: Blood pressure 126/80, pulse 84, temperature 98.6 F (37 C), temperature source Oral, resp. rate 20, height 5' (1.524 m), weight 81.9 kg  (180 lb 9.6 oz), SpO2 95 %. Physical Exam  Vitals reviewed. Constitutional: She appears well-developed.  HENT:  Head: Normocephalic.  Eyes: EOM are normal.  Neck: Normal range of motion. Neck supple. No thyromegaly present.  Cardiovascular: Normal rate and regular rhythm.   Respiratory: Effort normal and breath sounds normal. No respiratory distress.  GI: Soft. Bowel sounds are normal. She exhibits no distension.  Skin. Warm and dry Neurological. Patient is alert and oriented. Moves all extremities.  Pedal pulses intact. DTRs symmetrical Serial sevens intact Remembers 3/3 objects with immediate recall as well as after 5 minutes Motor strength is 5/5 bilateral deltoid, biceps, triceps, grip 4/5. Bilateral hip flexor, knee extensor, ankle dorsiflexors Sensation intact to light touch and proprioception bilateral upper and lower extremity   Lab Results Last 48 Hours       Results for orders placed or performed during the hospital encounter of 03/14/17 (from the past 48 hour(s))  Glucose, capillary     Status: Abnormal    Collection Time: 03/17/17  8:22 AM  Result Value Ref Range    Glucose-Capillary 103 (H) 65 - 99 mg/dL  Glucose, capillary     Status: None    Collection Time: 03/17/17 11:38 AM  Result Value Ref Range    Glucose-Capillary 94 65 - 99 mg/dL  Glucose, capillary     Status: Abnormal    Collection Time: 03/18/17  6:30 AM  Result Value Ref Range    Glucose-Capillary 107 (H) 65 - 99 mg/dL    Comment 1 Notify RN      Comment 2 Document in Chart    Glucose, capillary     Status: Abnormal    Collection Time: 03/19/17  6:08 AM  Result Value Ref Range    Glucose-Capillary 100 (H) 65 - 99 mg/dL    Comment 1 Notify RN      Comment 2 Document in Chart         Imaging Results (Last 48 hours)  Dg Chest 2 View   Result Date: 03/18/2017 CLINICAL DATA:  Permanent pacemaker placement yesterday EXAM: CHEST  2 VIEW COMPARISON:  Chest x-ray of March 10, 2017 FINDINGS: There  has been placement of a pacemaker generator over the left pectoral region with the single electrode extending along the left subclavian approach. The electrode terminates appropriately in the region of the right ventricular apex. There is no postprocedure pneumothorax. The lungs are well-expanded. The interstitial markings remain mildly increased. The heart is top-normal in size. The pulmonary vascularity is not engorged. IMPRESSION: There is no postprocedure complication following permanent pacemaker placement. Electronically Signed   By: David  [place] M.D.   On: 03/18/2017 07:22    Mr Cardiac Morphology W Wo Contrast   Result Date: 03/17/2017 CLINICAL DATA:  Cardiac Arrest EXAM: CARDIAC MRI TECHNIQUE: The patient was scanned on a 1.5 Tesla GE magnet. A dedicated cardiac coil was used. Functional imaging was done  using Fiesta sequences. 2,3, and 4 chamber views were done to assess for RWMA's. Modified Simpson's rule using a short axis stack was used to calculate an ejection fraction on a dedicated work [employer] officer, [employer]. The patient received 27 cc of Multihance. After 10 minutes inversion recovery sequences were used to assess for infiltration and scar tissue. CONTRAST:  27 cc Multihance FINDINGS: All 4 cardiac chambers were normal in size and function. There was no ASD/VSD. There was a trivial mostly posterior pericardial effusion. There were no RV diverticula or areas of hypokinesis. The LV was mildly hypertrophied at 12 mm. There were no RWMAls The quantitative EF was 70% (EDV 89 cc ESV 27 cc SV 62 cc) Delayed enhancement images with gadolinium showed mild mid myocardial uptake in the distal anterior wall and inferior base IMPRESSION: 1) Normal LV size mild LVH septal thickness 12 mm EF 70% 2) Mild mid epicardial gadolinium uptake in distal anterior wall and inferior base 3) Normal RV size and function 4) Trivial pericardial effusion Charlton Haws Electronically Signed   By: Charlton Haws M.D.    On: 03/17/2017 10:51             Medical Problem List and Plan: 1.  Decreased functional with severe deconditioning mobility secondary to VF cardiac arrest. Status post ICD placement 03/17/2017 2.  DVT Prophylaxis/Anticoagulation: SCDs 3. Pain Management: Tylenol as needed 4. Mood: Seroquel 25 mg daily at bedtime 5. Neuropsych: This patient is capable of making decisions on her own behalf. 6. Skin/Wound Care: Routine skin checks 7. Fluids/Electrolytes/Nutrition: Routine I&O with follow-up chemistries 8. Hypertension. Coreg 3.125 mg twice a day, 9. Diarrhea. Resolved. Follow-up outpatient gastroenterology 10. Hypokalemia. Follow-up chemistries 11. MRSA PCR screening positive. Contact precautions   Post Admission Physician Evaluation: 1. Functional deficits secondary  to Deconditioning. 2. Patient is admitted to receive collaborative, interdisciplinary care between the physiatrist, rehab nursing staff, and therapy team. 3. Patient's level of medical complexity and substantial therapy needs in context of that medical necessity cannot be provided at a lesser intensity of care such as a SNF. 4. Patient has experienced substantial functional loss from his/her baseline which was documented above under the "Functional History" and "Functional Status" headings.  Judging by the patient's diagnosis, physical exam, and functional history, the patient has potential for functional progress which will result in measurable gains while on inpatient rehab.  These gains will be of substantial and practical use upon discharge  in facilitating mobility and self-care at the household level. 5. Physiatrist will provide 24 hour management of medical needs as well as oversight of the therapy plan/treatment and provide guidance as appropriate regarding the interaction of the two. 6. The Preadmission Screening has been reviewed and patient status is unchanged unless otherwise stated above. 7. 24 hour rehab nursing  will assist with bladder management, bowel management, safety, skin/wound care, disease management, medication administration, pain management and patient education  and help integrate therapy concepts, techniques,education, etc. 8. PT will assess and treat for/with: pre gait, gait training, endurance , safety, equipment, neuromuscular re education.   Goals are: Mod I. 9. OT will assess and treat for/with: ADLs, Cognitive perceptual skills, Neuromuscular re education, safety, endurance, equipment.   Goals are: mod I. Therapy may proceed with showering this patient. 10. SLP will assess and treat for/with: eval med management.  Goals are: mod I med management. 11. Case Management and Social Worker will assess and treat for psychological issues and discharge planning. 12. Team conference will be  held weekly to assess progress toward goals and to determine barriers to discharge. 13. Patient will receive at least 3 hours of therapy per day at least 5 days per week. 14. ELOS: 7-11 days       15. Prognosis:  excellent         Erick Colace M.D. [facility] FAAPM&R (Sports Med, Neuromuscular Med) Diplomate Am Board of Electrodiagnostic Med  Charlton Amor., PA-C 03/19/2017

## 2017-03-20 NOTE — Clinical Social Work Note (Signed)
Patient has authorization approval for discharge to CIR today.  CSW signing off.   Charlynn Court, CSW [phone]

## 2017-03-21 ENCOUNTER — Inpatient Hospital Stay (HOSPITAL_COMMUNITY): Payer: BC Managed Care – PPO | Admitting: Occupational Therapy

## 2017-03-21 ENCOUNTER — Inpatient Hospital Stay (HOSPITAL_COMMUNITY): Payer: BC Managed Care – PPO | Admitting: Physical Therapy

## 2017-03-21 ENCOUNTER — Inpatient Hospital Stay (HOSPITAL_COMMUNITY): Payer: BC Managed Care – PPO

## 2017-03-21 DIAGNOSIS — I10 Essential (primary) hypertension: Secondary | ICD-10-CM

## 2017-03-21 DIAGNOSIS — E46 Unspecified protein-calorie malnutrition: Secondary | ICD-10-CM

## 2017-03-21 DIAGNOSIS — D62 Acute posthemorrhagic anemia: Secondary | ICD-10-CM

## 2017-03-21 DIAGNOSIS — E876 Hypokalemia: Secondary | ICD-10-CM

## 2017-03-21 LAB — COMPREHENSIVE METABOLIC PANEL
ALK PHOS: 84 U/L (ref 38–126)
ALT: 48 U/L (ref 14–54)
ANION GAP: 8 (ref 5–15)
AST: 34 U/L (ref 15–41)
Albumin: 3.1 g/dL — ABNORMAL LOW (ref 3.5–5.0)
BUN: 11 mg/dL (ref 6–20)
CHLORIDE: 108 mmol/L (ref 101–111)
CO2: 25 mmol/L (ref 22–32)
Calcium: 8.8 mg/dL — ABNORMAL LOW (ref 8.9–10.3)
Creatinine, Ser: 0.6 mg/dL (ref 0.44–1.00)
GFR calc non Af Amer: >60 mL/min (ref >60)
GLUCOSE: 110 mg/dL — AB (ref 65–99)
Potassium: 2.7 mmol/L — CL (ref 3.5–5.1)
SODIUM: 141 mmol/L (ref 135–145)
Total Bilirubin: 0.8 mg/dL (ref 0.3–1.2)
Total Protein: 6.2 g/dL — ABNORMAL LOW (ref 6.5–8.1)

## 2017-03-21 LAB — CBC WITH DIFFERENTIAL/PLATELET
BASOS PCT: 1 %
Basophils Absolute: 0 10*3/uL (ref 0.0–0.1)
EOS ABS: 0.3 10*3/uL (ref 0.0–0.7)
EOS PCT: 7 %
HCT: 32.2 % — ABNORMAL LOW (ref 36.0–46.0)
HEMOGLOBIN: 10.5 g/dL — AB (ref 12.0–15.0)
Lymphocytes Relative: 41 %
Lymphs Abs: 1.7 10*3/uL (ref 0.7–4.0)
MCH: 30.3 pg (ref 26.0–34.0)
MCHC: 32.6 g/dL (ref 30.0–36.0)
MCV: 92.8 fL (ref 78.0–100.0)
MONO ABS: 0.3 10*3/uL (ref 0.1–1.0)
MONOS PCT: 8 %
Neutro Abs: 1.8 10*3/uL (ref 1.7–7.7)
Neutrophils Relative %: 43 %
PLATELETS: 172 10*3/uL (ref 150–400)
RBC: 3.47 MIL/uL — AB (ref 3.87–5.11)
RDW: 15.7 % — ABNORMAL HIGH (ref 11.5–15.5)
WBC: 4 10*3/uL (ref 4.0–10.5)

## 2017-03-21 LAB — CALPROTECTIN, FECAL

## 2017-03-21 MED ORDER — POTASSIUM CHLORIDE 10 MEQ/100ML IV SOLN
10.0000 meq | INTRAVENOUS | Status: AC
  Administered 2017-03-21: 10 meq via INTRAVENOUS
  Filled 2017-03-21 (×4): qty 100

## 2017-03-21 MED ORDER — POTASSIUM CHLORIDE CRYS ER 20 MEQ PO TBCR
40.0000 meq | EXTENDED_RELEASE_TABLET | Freq: Every day | ORAL | Status: AC
  Administered 2017-03-21 – 2017-03-23 (×3): 40 meq via ORAL
  Filled 2017-03-21 (×4): qty 2

## 2017-03-21 MED ORDER — PRO-STAT SUGAR FREE PO LIQD
30.0000 mL | Freq: Two times a day (BID) | ORAL | Status: DC
  Administered 2017-03-21 – 2017-03-28 (×14): 30 mL via ORAL
  Filled 2017-03-21 (×15): qty 30

## 2017-03-21 MED ORDER — POTASSIUM CHLORIDE CRYS ER 20 MEQ PO TBCR
40.0000 meq | EXTENDED_RELEASE_TABLET | Freq: Two times a day (BID) | ORAL | Status: AC
  Administered 2017-03-21 (×2): 40 meq via ORAL
  Filled 2017-03-21 (×2): qty 2

## 2017-03-21 NOTE — Progress Notes (Signed)
CRITICAL VALUE ALERT  Critical Value:  2.7 potassium  Date & Time Notied:  7:12  Provider Notified: Patel   Orders Received/Actions taken: 4 runs of Potassium

## 2017-03-21 NOTE — Progress Notes (Signed)
[facility]IL[facility]     PROGRESS NOTE  Subjective/Complaints:  Pt seen laying in bed this AM.  She slept fairly overnight, trying to get adjusted to a new place.  She is ready to begin therapies.   ROS: Denies CP, SOB, N/V/D.  Objective: Vital Signs: Blood pressure 107/62, pulse 75, temperature 98.6 F (37 C), temperature source Oral, resp. rate 16, height 5' (1.524 m), weight 82.6 kg (182 lb), SpO2 96 %. No results found.  Recent Labs  03/21/17 0543  WBC 4.0  HGB 10.5*  HCT 32.2*  PLT 172    Recent Labs  03/21/17 0543  NA 141  K 2.7*  CL 108  GLUCOSE 110*  BUN 11  CREATININE 0.60  CALCIUM 8.8*   CBG (last 3)   Recent Labs  03/19/17 0608 03/20/17 0611  GLUCAP 100* 107*    Wt Readings from Last 3 Encounters:  03/21/17 82.6 kg (182 lb)  03/20/17 82.1 kg (180 lb 14.4 oz)  03/14/17 82.6 kg (182 lb 3.2 oz)    Physical Exam:  BP 107/62 (BP Location: Right Arm)   Pulse 75   Temp 98.6 F (37 C) (Oral)   Resp 16   Ht 5' (1.524 m)   Wt 82.6 kg (182 lb)   SpO2 96%   BMI 35.54 kg/m  Constitutional: She appears well-developed and well-nourished. NAD. HENT: Normocephalic. Atraumatic. Eyes: EOMare normal. No discharge.  Cardiovascular: Normal rateand regular rhythm. No JVD. Respiratory: Effort normaland breath sounds normal.  GI: Bowel sounds are normal. She exhibits no distension.  Neurological. Patient is alert and oriented.  Serial sevens intact Motor: B/l 5/5 bilateral deltoid, biceps, triceps, grip  B/l  hip flexor, knee extensor, ankle dorsiflexors 4/5.  Skin. Warm and dry. Intact. Psych: Normal mood and behavior.   Assessment/Plan: 1. Functional deficits secondary to debility which require 3+ hours per day of interdisciplinary therapy in a comprehensive inpatient rehab setting. Physiatrist is providing close team supervision and 24 hour management of active medical problems listed below. Physiatrist and rehab team  continue to assess barriers to discharge/monitor patient progress toward functional and medical goals.  Function:  Bathing Bathing position      Bathing parts      Bathing assist        Upper Body Dressing/Undressing Upper body dressing                    Upper body assist        Lower Body Dressing/Undressing Lower body dressing                                  Lower body assist        Toileting Toileting   Toileting steps completed by patient: Adjust clothing prior to toileting, Performs perineal hygiene, Adjust clothing after toileting      Toileting assist     Transfers Chair/bed transfer             Locomotion Ambulation           Wheelchair          Cognition Comprehension Comprehension assist level: Follows complex conversation/direction with no assist  Expression Expression assist level: Expresses complex ideas: With no assist  Social Interaction Social Interaction assist level: Interacts appropriately with others - No medications needed.  Problem Solving Problem solving assist level: Solves complex problems: Recognizes & self-corrects  Memory  Memory assist level: Complete Independence: No helper    Medical Problem List and Plan: 1. Decreased functional with severe deconditioning mobilitysecondary to VF cardiac arrest. Status post ICD placement 03/17/2017  Begin CIR 2. DVT Prophylaxis/Anticoagulation: SCDs 3. Pain Management: Tylenol as needed 4. Mood: Seroquel 25 mg daily at bedtime 5. Neuropsych: This patient iscapable of making decisions on herown behalf. 6. Skin/Wound Care: Routine skin checks 7. Fluids/Electrolytes/Nutrition: Routine I&Os 8.Hypertension. Coreg 3.125 mg twice a day,  Monitor with increased mobility 9.Diarrhea. Resolved. Follow-up outpatient gastroenterology 10.Hypokalemia.   Critical value 2.7 on 9/14  Supplementing IV and PO  Labs ordered for tomorrow 11.MRSA PCR screening positive.  Contact precautions. 12. Hypoalbuminemia  Supplement initiated 9/14 13. ABLA  Hb 10.5 on 9/14  Cont to monitor  LOS (Days) 1 A FACE TO FACE EVALUATION WAS PERFORMED  Mackie Goon Karis Juba 03/21/2017 8:05 AM

## 2017-03-21 NOTE — IPOC Note (Signed)
Overall Plan of Care [facility]) Patient Details Name: Miranda [name] MRN: [id number] DOB: [birth year]  Admitting Diagnosis: Debility  Hospital Problems: Principal Problem:   Debility Active Problems:   Cardiac arrest with ventricular fibrillation (HCC)   Cardiac arrest (HCC)   Acute blood loss anemia   Hypoalbuminemia due to protein-calorie malnutrition (HCC)   Benign essential HTN     Functional Problem List: Nursing Endurance, Pain  PT Endurance (Unable to formally assess secondary to MD hold on OOB and bedside therapies this date, pt reporting decreased endurance)  OT Balance, Endurance, Safety  SLP    TR         Basic ADL's: OT Grooming, Bathing, Dressing, Toileting     Advanced  ADL's: OT Simple Meal Preparation, Laundry     Transfers: PT Bed Mobility, Bed to Chair, Car, Furniture, Floor (Unable to formally assess secondary to MD hold on OOB and bedside therapies this date, pt reporting decreased endurance and functional independence w/ all mobility)  OT Toilet, Tub/Shower     Locomotion: PT Ambulation, Wheelchair Mobility, Stairs (Unable to formally assess secondary to MD hold on OOB and bedside therapies this date, pt reporting decreased endurance and functional independence w/ all mobility)     Additional Impairments: OT    SLP        TR      Anticipated Outcomes Item Anticipated Outcome  Self Feeding MOD I  Swallowing      Basic self-care  MOD I  Toileting  MOD I toilet; S shower   Bathroom Transfers MOD I toilet; S shower  Bowel/Bladder  will remain contient while in rehab  Transfers  Mod I   Locomotion  Mod I   Communication     Cognition     Pain  will remain pain free or at acceptable level  Safety/Judgment      Therapy Plan: PT Intensity: Minimum of 1-2 x/day ,45 to 90 minutes PT Frequency: 5 out of 7 days PT Duration Estimated Length of Stay: 5-7 days OT Intensity: Minimum of 1-2 x/day, 45 to 90 minutes OT Frequency: 5 out of 7  days OT Duration/Estimated Length of Stay: 5-7      Team Interventions: Nursing Interventions Patient/Family Education, Skin Care/Wound Management, Discharge Planning  PT interventions Ambulation/gait training, Disease management/prevention, Pain management, Stair training, Wheelchair propulsion/positioning, Therapeutic Activities, Patient/family education, Fish farm manager, Warden/ranger, Psychosocial support, Therapeutic Exercise, UE/LE Strength taining/ROM, Skin care/wound management, Functional mobility training, Community reintegration, Discharge planning, Neuromuscular re-education, Splinting/orthotics, UE/LE Coordination activities  OT Interventions Warden/ranger, Community reintegration, Disease mangement/prevention, Fish farm manager, Discharge planning, Functional mobility training, Patient/family education, Psychosocial support, Self Care/advanced ADL retraining, Therapeutic Exercise, UE/LE Coordination activities, UE/LE Strength taining/ROM, Therapeutic Activities  SLP Interventions    TR Interventions    SW/CM Interventions Discharge Planning, Psychosocial Support, Patient/Family Education   Barriers to Discharge MD  Medical stability and Hypokalemia  Nursing      PT Inaccessible home environment, Home environment access/layout, Medical stability Pt s/p cardiac arrest and w/ fluctuating lab values that may impact POC  OT      SLP      SW Lack of/limited family support Doesn't have 24 hr care if needed   Team Discharge Planning: Destination: PT-Home ,OT- Home , SLP-  Projected Follow-up: PT-Outpatient PT (Pt reporting husband or son able to drive her to/from appointments), OT-  Outpatient OT, SLP-  Projected Equipment Needs: PT-To be determined, OT- Tub/shower seat, To be determined, SLP-  Equipment Details:  PT- , OT-  Patient/family involved in discharge planning: PT- Patient,  OT-Patient, SLP-   MD ELOS: 5-7  days. Medical Rehab Prognosis:  Good Assessment: 56 y.o. right handed female with history of hypertension, migraine headaches. Presented to [facility] 02/26/2017 after being found down by her husband in the bathroom unresponsive. Husband initiated CPR and EMS delivered 2 shocks. Patient with VF arrest underwent urgent left heart catheterization showed nonobstructive CAD. Prior to admission patient had profuse watery diarrhea 3 days and was not able to stay hydrated and was taking multiple doses of Lomotil. Husband had reported she had not been taking her blood pressure medications. Potassium level 2.1 on admission, creatinine 1.26. CT of the chest showed bilateral lung opacities concerning for edema versus pneumonia. Cranial CT scan negative. Patient did require intubation. Echocardiogram with ejection fraction of less than 20% diffuse hypokinesis. Patient was transferred to [facility] 03/15/2017 with cardiac MRI completed that was negative. Underwent ICD placement 03/17/2017 and patient since extubated. Hospital course follow-up gastroenterology for bouts of diarrhea with conservative care and consider colonoscopy plus EGD as an outpatient. Pt with resulting functional deficits with mobility, self-care, and cognition.  Will set goals for Mod I with PT/OT.   See Team Conference Notes for weekly updates to the plan of care

## 2017-03-21 NOTE — Progress Notes (Signed)
Physical Therapy Assessment and Plan  Patient Details  Name: Miranda [name] MRN: [id number] Date of Birth: [birth year]  PT Diagnosis: Difficulty walking and Muscle weakness (per chart review, conversation w/ pt, and conversation w/ other members of treatment team)  Rehab Potential: Excellent ELOS: 5-7 days   Today's Date: 03/21/2017 PT Individual Time: 0962-8366 PT Individual Time Calculation (min): 15 min  and Today's Date: 03/21/2017 PT Missed Time: 75 Minutes Missed Time Reason: MD hold (Comment) (spoke w/ PA-C, pt on medical hold for low potassium, hold all therapies, PA-C agreeable to PT evaluating pt via face-to-face conversation only)   Problem List:  Patient Active Problem List   Diagnosis Date Noted  . Acute blood loss anemia   . Hypoalbuminemia due to protein-calorie malnutrition ([place])   . Benign essential HTN   . Debility   . AKI (acute kidney injury) ([place]) 03/14/2017  . Diarrhea 03/14/2017  . Bradycardia   . Cardiac arrest with ventricular fibrillation ([place]) 02/26/2017  . Cardiac arrest ([place]) 02/26/2017  . Acute respiratory failure ([place])   . Hypokalemia   . Encounter for central line placement   . Cardiogenic shock ([place])   . Acute pulmonary edema (HCC)   . Anoxic encephalopathy ([place])   . Hypertension 08/16/2016  . Edema 08/16/2016  . Nausea 08/16/2016  . Class 2 obesity due to excess calories without serious comorbidity with body mass index (BMI) of 35.0 to 35.9 in adult 08/16/2016    Past Medical History:  Past Medical History:  Diagnosis Date  . Chronic systolic CHF (congestive heart failure) ([place])    a. TTE 02/26/2017: EF < 20%, diffuse HK, mild AI, RV sys fxn mildly reduced; b. TTE 03/03/2017: EF 55-60%, nl WM, mild AI, PASP 46  . Coronary artery disease, non-occlusive    a. LHC 02/25/2017: no angiographically significant CAD, EF <35%, moderately elevated LVEDP at 28 mmHg  . Dependent edema    bilateral legs  . Hot flashes   . HTN (hypertension)   . Migraines    . Nausea   . Ventricular fibrillation ([place])    a. 02/25/2017: LHC without signficant CAD; b. idiopathic; c. possibly 2/2 diarrhea with electrolyte abnormalities with Imodium usage   Past Surgical History:  Past Surgical History:  Procedure Laterality Date  . APPENDECTOMY  1974  . CHOLECYSTECTOMY  1984  . ICD IMPLANT N/A 03/17/2017   Procedure: ICD Implant;  Surgeon: Deboraha Sprang, MD;  Location: [facility];  Service: Cardiovascular;  Laterality: N/A;  . LEFT HEART CATH AND CORONARY ANGIOGRAPHY N/A 02/26/2017   Procedure: LEFT HEART CATH AND CORONARY ANGIOGRAPHY;  Surgeon: Nelva Bush, MD;  Location: [place] [facility];  Service: Cardiovascular;  Laterality: N/A;  . TONSILLECTOMY  1964    Assessment & Plan Clinical Impression: Patient is a 56 y.o.right handed femalewith history of hypertension, migraine headaches. Per chart review patient lives with spouse. One level apartment. 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 Husband works during the day.Presented to [facility] 08/22/2018after being found down by her husband in the bathroom unresponsive. Husband initiated CPR and EMS delivered 2 shocks. Patient withVF arrest underwent urgent left heart catheterization showed nonobstructive CAD. Prior to admission patient hadprofusewatery diarrhea 3 days and wasnotable to stay hydratedand was taking multiple doses of Lomotil.Husband had reported she had not been taking her blood pressure medications.Potassium level 2.1 on admission, creatinine 1.26. CT of the chest showed bilateral lung opacities concerning for edema versus pneumonia. Cranial CT scan negative. Patient  did require intubation. Echocardiogram with ejection fraction of less than 20% diffuse hypokinesis. Patient was transferred to [facility] 03/15/2017 with cardiac MRI completed that was negative. Underwent ICD placement 03/17/2017 and patient since extubated. Hospital course follow-up  gastroenterology for bouts of diarrhea with conservative care and consider colonoscopy plus EGD as an outpatient. Hypokalemia has resolved latest potassium 3.7.MRSA PCR screen positive. Placed on contact precautions. Patient transferred to CIR on 03/20/2017 .   Patient currently requires unknown level of assist with mobility secondary to muscle weakness and decreased cardiorespiratoy endurance (per chart review, conversation w/ pt, and conversation w/ other members of treatment team.  Will continue to assess assistance pt requires as she becomes medically appropriate for therapy. Prior to hospitalization, patient was independent  with mobility and lived with Spouse in a [place] home.  Home access is  Level entry.  Patient will benefit from skilled PT intervention to maximize safe functional mobility, minimize fall risk and decrease caregiver burden for planned discharge home with intermittent assist.  Anticipate patient will benefit from follow up OP at discharge.  PT - End of Session Activity Tolerance:  (Unable to assess secondary to MD hold on OOB and bedside therapies this date) Endurance Deficit: Yes PT Assessment Rehab Potential (ACUTE/IP ONLY): Excellent PT Barriers to Discharge: Inaccessible home environment;Home environment access/layout;Medical stability PT Barriers to Discharge Comments: Pt s/p cardiac arrest and w/ fluctuating lab values that may impact POC PT Patient demonstrates impairments in the following area(s): Endurance (Unable to formally assess secondary to MD hold on OOB and bedside therapies this date, pt reporting decreased endurance) PT Transfers Functional Problem(s): Bed Mobility;Bed to Chair;Car;Furniture;Floor (Unable to formally assess secondary to MD hold on OOB and bedside therapies this date, pt reporting decreased endurance and functional independence w/ all mobility) PT Locomotion Functional Problem(s): Ambulation;Wheelchair Mobility;Stairs (Unable to formally  assess secondary to MD hold on OOB and bedside therapies this date, pt reporting decreased endurance and functional independence w/ all mobility) PT Plan PT Intensity: Minimum of 1-2 x/day ,45 to 90 minutes PT Frequency: 5 out of 7 days PT Duration Estimated Length of Stay: 5-7 days PT Treatment/Interventions: Ambulation/gait training;Disease management/prevention;Pain management;Stair training;Wheelchair propulsion/positioning;Therapeutic Activities;Patient/family education;DME/adaptive equipment instruction;Balance/vestibular training;Psychosocial support;Therapeutic Exercise;UE/LE Strength taining/ROM;Skin care/wound management;Functional mobility training;Community reintegration;Discharge planning;Neuromuscular re-education;Splinting/orthotics;UE/LE Coordination activities PT Transfers Anticipated Outcome(s): Mod I  PT Locomotion Anticipated Outcome(s): Mod I  PT Recommendation Recommendations for Other Services: Therapeutic Recreation consult Therapeutic Recreation Interventions: Outing/community reintergration Follow Up Recommendations: Outpatient PT (Pt reporting husband or son able to drive her to/from appointments) Patient destination: Home Equipment Recommended: To be determined  Skilled Therapeutic Intervention  Pt supine upon arrival and agreeable to speaking w/ PT, no c/o pain. Discussed pt's PLOF, home access/layout, caregiver support after discharge, and personal goals w/ therapy. Discussed pt's ELOS per PT's discussion w/ treating OT and SW in addition to expected rehab potential, goals, and discharge recommendations. Pt educated on POC as well. Ended session supine, call bell within reach and all needs met.   PT Evaluation Precautions/Restrictions Precautions Precautions: Fall Precaution Comments: ICD placement on 9/10, no heavy lifting or strenuous activity w/ UEs for 6-8 weeks Restrictions Weight Bearing Restrictions: Yes LUE Weight Bearing: Non weight bearing Other  Position/Activity Restrictions: s/p cath procedure precautions General PT Amount of Missed Time (min): 75 Minutes PT Missed Treatment Reason: MD hold (Comment) (spoke w/ PA-C, pt on medical hold for low potassium, hold all therapies)  Pain Pain Assessment Pain Assessment: No/denies pain Home Living/Prior [place]  Living Available Help at Discharge: Family;Available 24 hours/day Type of Home: Apartment Home Access: Level entry Home Layout: One level Bathroom Toilet: Standard  Lives With: Spouse Prior Function Level of Independence: Independent with basic ADLs;Independent with gait  Able to Take Stairs?: Yes Driving: Yes Vocation: Full time employment Vocation Requirements: 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), 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 Vision/Perception  Perception Perception: Within Functional Limits Praxis Praxis: Intact  Cognition Overall Cognitive Status: Within Functional Limits for tasks assessed Arousal/Alertness: Awake/alert Orientation Level: Oriented X4 Attention: Selective Selective Attention: Appears intact Memory: Appears intact Awareness: Appears intact Problem Solving: Appears intact Safety/Judgment: Appears intact Sensation Sensation Light Touch: Appears Intact Proprioception: Not tested (Unable to formally assess secondary to MD hold on OOB and bedside therapies this date) Coordination Gross Motor Movements are Fluid and Coordinated: Not tested (Unable to formally assess secondary to MD hold on OOB and bedside therapies this date) Fine Motor Movements are Fluid and Coordinated: Not tested (Unable to formally assess secondary to MD hold on OOB and bedside therapies this date) Motor  Motor Motor - Skilled Clinical Observations: Unable to assess secondary to MD hold on OOB and bedside therapies this date, will continue to assess  Mobility Bed Mobility Bed Mobility: Not assessed (Unable to  assess secondary to MD hold on OOB and bedside therapies this date) Transfers Transfers: No (Unable to assess secondary to MD hold on OOB and bedside therapies this date) Locomotion  Ambulation Ambulation: No (Unable to assess secondary to MD hold on OOB and bedside therapies this date) Gait Gait: No (Unable to assess secondary to MD hold on OOB and bedside therapies this date) Stairs / Additional Locomotion Stairs: No (Unable to assess secondary to MD hold on OOB and bedside therapies this date) Wheelchair Mobility Wheelchair Mobility: No (Unable to assess secondary to MD hold on OOB and bedside therapies this date)  Trunk/Postural Assessment  Unable to formally assess secondary to MD hold on OOB and bedside therapies this date Balance Balance Balance Assessed: No (Unable to assess secondary to MD hold on OOB and bedside therapies this date) Extremity Assessment  RUE Assessment RUE Assessment: Not tested (Defer to OT) LUE Assessment LUE Assessment: Not tested (Defer to OT) RLE Assessment RLE Assessment: Not tested (Unable to assess secondary to MD hold on OOB and bedside therapies this date) LLE Assessment LLE Assessment: Not tested (Unable to assess secondary to MD hold on OOB and bedside therapies this date)   See Function Navigator for Current Functional Status.   Refer to Care Plan for Long Term Goals  Recommendations for other services: Therapeutic Recreation  Outing/community reintegration  Discharge Criteria: Patient will be discharged from PT if patient refuses treatment 3 consecutive times without medical reason, if treatment goals not met, if there is a change in medical status, if patient makes no progress towards goals or if patient is discharged from hospital.  The above assessment, treatment plan, treatment alternatives and goals were discussed and mutually agreed upon: by patient  Adorian Gwynne K Arnette 03/21/2017, 2:30 PM

## 2017-03-21 NOTE — Progress Notes (Signed)
Occupational Therapy Session Note  Patient Details  Name: Miranda [name] MRN: [id number] Date of Birth: 11/19/1960  Today's Date: 03/21/2017 OT Individual Time: 4314-2767 OT Individual Time Calculation (min): 44 min    Short Term Goals: Week 1:  OT Short Term Goal 1 (Week 1): STG=LTG at MOD I level  Skilled Therapeutic Interventions/Progress Updates:  Treatment session focused on bedside there activities, unsupported trunk control, FMC/GMC, and functional reaching tasks. Upon entering nurse in room and notified therapist pt MD, pt is to  Be on bedrest for the rest of day d/t low potassium levels. Pt does not tolerate IV therefore being medicated through orals meds. Pt agreed to bedside there activities for functional tasks at table top level. Pt moved from supine to EOB sit with S and v/c for hand/foot placement. Pt needed reminders to avoid using L UE during mobility. Pt tolerated upright sitting w/ no c/o pain during there activity and able to follow and recall directions to card game and dexterity task involving opening and closing medicine bottles. Pt moved self back to supine lying with S and repositioned self in bed. Therapist instructed pt on safe bed mobility with limited UE use. Pt understood and was left resting in bed with needs met.    Balance/vestibular training;Community reintegration;Disease Lawyer;Discharge planning;Functional mobility training;Patient/family education;Psychosocial support;Self Care/advanced ADL retraining;Therapeutic Exercise;UE/LE Coordination activities;UE/LE Strength taining/ROM;Therapeutic Activities   Therapy Documentation Precautions:  Precautions Precautions: Fall Precaution Comments: ICD placement on 9/10, no heavy lifting or strenuous activity w/ UEs for 6-8 weeks Restrictions Weight Bearing Restrictions: Yes LUE Weight Bearing: Non weight bearing Other Position/Activity Restrictions: s/p cath  procedure precautions General: General PT Missed Treatment Reason: MD hold (Comment) (spoke w/ PA-C, pt on medical hold for low potassium, hold all therapies) Vital Signs: Therapy Vitals Temp: 98.2 F (36.8 C) Temp Source: Oral Pulse Rate: 69 Resp: 18 BP: 106/63 Patient Position (if appropriate): Lying Oxygen Therapy SpO2: 99 % O2 Device: Not Delivered Pain: Pain Assessment Pain Assessment: No/denies pain    Vision Baseline Vision/History: No visual deficits Patient Visual Report: No change from baseline Vision Assessment?: No apparent visual deficits Perception  Perception: Within Functional Limits Praxis Praxis: Intact  See Function Navigator for Current Functional Status.   Therapy/Group: Individual Therapy  Delon [place] 03/21/2017, 3:30 PM

## 2017-03-21 NOTE — Progress Notes (Signed)
Patient information reviewed and entered into e[facility] System by Becky Biana Haggar, covering PPS coordinator. Information including medical coding and functional independence measure will be reviewed and updated through discharge.   

## 2017-03-21 NOTE — Progress Notes (Signed)
Social Work Assessment and Plan Social Work Assessment and Plan  Patient Details  Name: Miranda [name] MRN: [id number] Date of Birth: 1960/10/02  Today's Date: 03/21/2017  Problem List:  Patient Active Problem List   Diagnosis Date Noted  . Acute blood loss anemia   . Hypoalbuminemia due to protein-calorie malnutrition (HCC)   . Benign essential HTN   . Debility   . AKI (acute kidney injury) (HCC) 03/14/2017  . Diarrhea 03/14/2017  . Bradycardia   . Cardiac arrest with ventricular fibrillation (HCC) 02/26/2017  . Cardiac arrest (HCC) 02/26/2017  . Acute respiratory failure (HCC)   . Hypokalemia   . Encounter for central line placement   . Cardiogenic shock (HCC)   . Acute pulmonary edema (HCC)   . Anoxic encephalopathy (HCC)   . Hypertension 08/16/2016  . Edema 08/16/2016  . Nausea 08/16/2016  . Class 2 obesity due to excess calories without serious comorbidity with body mass index (BMI) of 35.0 to 35.9 in adult 08/16/2016   Past Medical History:  Past Medical History:  Diagnosis Date  . Chronic systolic CHF (congestive heart failure) (HCC)    a. TTE 02/26/2017: EF < 20%, diffuse HK, mild AI, RV sys fxn mildly reduced; b. TTE 03/03/2017: EF 55-60%, nl WM, mild AI, PASP 46  . Coronary artery disease, non-occlusive    a. LHC 02/25/2017: no angiographically significant CAD, EF <35%, moderately elevated LVEDP at 28 mmHg  . Dependent edema    bilateral legs  . Hot flashes   . HTN (hypertension)   . Migraines   . Nausea   . Ventricular fibrillation (HCC)    a. 02/25/2017: LHC without signficant CAD; b. idiopathic; c. possibly 2/2 diarrhea with electrolyte abnormalities with Imodium usage   Past Surgical History:  Past Surgical History:  Procedure Laterality Date  . APPENDECTOMY  1974  . CHOLECYSTECTOMY  1984  . ICD IMPLANT N/A 03/17/2017   Procedure: ICD Implant;  Surgeon: Duke Salvia, MD;  [facility]: [facility] INVASIVE CV LAB;  Service: Cardiovascular;  Laterality: N/A;  . LEFT  HEART CATH AND CORONARY ANGIOGRAPHY N/A 02/26/2017   Procedure: LEFT HEART CATH AND CORONARY ANGIOGRAPHY;  Surgeon: Yvonne Kendall, MD;  Location: [facility] INVASIVE CV LAB;  Service: Cardiovascular;  Laterality: N/A;  . TONSILLECTOMY  1964   Social History:  reports that she has never smoked. She has never used smokeless tobacco. She reports that she does not drink alcohol or use drugs.  Family / Support Systems Marital Status: Married Patient Roles: Spouse, Parent, Other (Comment) (employee) Spouse/Significant Other: William-[phone]-cell Children: Sundra Aland [phone] Other Supports: Daughter also local and supportive Anticipated Caregiver: husband and son Ability/Limitations of Caregiver: Both work daytime and son goes to school besides work Medical laboratory scientific officer: Evenings only Family Dynamics: Close knit family who rely upon one another. Will pull together to get through this and so grateful pt is doing well and up moving and cognitively intact. Has extended family and friends, along with co-workers who are supportive.  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   Abuse/Neglect Physical Abuse: Denies Verbal Abuse: Denies Sexual Abuse: Denies Exploitation of patient/patient's resources: Denies Self-Neglect: Denies  Emotional Status  Pt's affect, behavior adn adjustment status: Pt has always been independent and the caregivers of others, this role is not familiar to her. She plans to regain her independence and get back to work eventually. She feels she has realistic goals and can  achieve them while here. Recent Psychosocial Issues: health issues managed by PCP Pyschiatric History: No history deferred depression screen due to coping appropriately and adjusting to the new unit. 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 She has a strong faith and feels it was not her time to leave this earth. May benefi tfroms eeing neuro-psych due to young age and all that has happened to her. Substance Abuse History: No issues  Patient / Family Perceptions, Expectations & Goals Pt/Family understanding of illness & functional limitations: Pt and husband are able to expalin her health issues what happened and treatment plan ahead. Pt realizes she was burning the candle at both ends with the amount of hours she was working and plans to limit to one job now. Premorbid pt/family roles/activities: Mom, employee, wife, daughter, colleague, freind, etc Anticipated changes in roles/activities/participation: resume Pt/family expectations/goals: Pt states: " I want to get back to being me, but will change my working habits, no more working six days a week."  Husband states: " I am so glad she is doing so well, I thought I lost her.'  [employer]: None Premorbid Home Care/DME Agencies: None Transportation available at discharge: Family Resource referrals recommended: Neuropsychology  Discharge Planning Living Arrangements: Spouse/significant other, Children Support Systems: Spouse/significant other, Children, Other relatives, Manufacturing engineer, Psychologist, clinical community Type of Residence: Private residence Insurance Resources: Media planner (specify) Chief Executive Officer) Surveyor, quantity Resources: Employment, Garment/textile technologist Screen Referred: No Living Expenses: Psychologist, sport and exercise Management: Patient, Spouse Does the patient have any problems obtaining your medications?: No Home Management: Patient and husband help one another Patient/Family Preliminary Plans: Return home with  husband and son who can provide assist in the evenings, both work during the day time. Will await team's evaluations and work on safe plna for pt. Hopefully she will not require 24 hr care. Sw Barriers to Discharge: Lack of/limited family support Sw Barriers to Discharge Comments: Doesn't have 24 hr care if needed Social Work Anticipated Follow Up Needs: HH/OP, Support Group  Clinical Impression Pleasant very lucky patient doing very well with all she has gone through medically. She realizes she was working way too much and needs to cut back and plans to choose only one job instead of two. Very supportive family who will assist but work during the day. May benefit from seeing neuro-psych while here due to all she has been through. Seems to be high level so will be short length of stay here. Await team's evaluations.  Lucy Chris 03/21/2017, 10:23 AM

## 2017-03-21 NOTE — Evaluation (Signed)
Occupational Therapy Assessment and Plan  Patient Details  Name: Miranda [name] MRN: [id number] Date of Birth: 1960/12/08  OT Diagnosis: muscle weakness (generalized) Rehab Potential: Rehab Potential (ACUTE ONLY): Excellent ELOS: 5-7   Today's Date: 03/21/2017 OT Individual Time: 2774-1287 OT Individual Time Calculation (min): 57 min     Problem List:  Patient Active Problem List   Diagnosis Date Noted  . Acute blood loss anemia   . Hypoalbuminemia due to protein-calorie malnutrition ([place])   . Benign essential HTN   . Debility   . AKI (acute kidney injury) ([place]) 03/14/2017  . Diarrhea 03/14/2017  . Bradycardia   . Cardiac arrest with ventricular fibrillation ([place]) 02/26/2017  . Cardiac arrest ([place]) 02/26/2017  . Acute respiratory failure ([place])   . Hypokalemia   . Encounter for central line placement   . Cardiogenic shock ([place])   . Acute pulmonary edema (HCC)   . Anoxic encephalopathy ([place])   . Hypertension 08/16/2016  . Edema 08/16/2016  . Nausea 08/16/2016  . Class 2 obesity due to excess calories without serious comorbidity with body mass index (BMI) of 35.0 to 35.9 in adult 08/16/2016    Past Medical History:  Past Medical History:  Diagnosis Date  . Chronic systolic CHF (congestive heart failure) ([place])    a. TTE 02/26/2017: EF < 20%, diffuse HK, mild AI, RV sys fxn mildly reduced; b. TTE 03/03/2017: EF 55-60%, nl WM, mild AI, PASP 46  . Coronary artery disease, non-occlusive    a. LHC 02/25/2017: no angiographically significant CAD, EF <35%, moderately elevated LVEDP at 28 mmHg  . Dependent edema    bilateral legs  . Hot flashes   . HTN (hypertension)   . Migraines   . Nausea   . Ventricular fibrillation ([place])    a. 02/25/2017: LHC without signficant CAD; b. idiopathic; c. possibly 2/2 diarrhea with electrolyte abnormalities with Imodium usage   Past Surgical History:  Past Surgical History:  Procedure Laterality Date  . APPENDECTOMY  1974  . CHOLECYSTECTOMY   1984  . ICD IMPLANT N/A 03/17/2017   Procedure: ICD Implant;  Surgeon: Deboraha Sprang, MD;  Location: [place] CV LAB;  Service: Cardiovascular;  Laterality: N/A;  . LEFT HEART CATH AND CORONARY ANGIOGRAPHY N/A 02/26/2017   Procedure: LEFT HEART CATH AND CORONARY ANGIOGRAPHY;  Surgeon: Nelva Bush, MD;  Location: [place] CV LAB;  Service: Cardiovascular;  Laterality: N/A;  . TONSILLECTOMY  1964    Assessment & Plan Clinical Impression:  Miranda [name] a 56 y.o.right handed femalewith history of hypertension, migraine headaches. Per chart review patient lives with spouse. One level apartment. 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 Husband works during the day.Presented to [facility] 08/22/2018after being found down by her husband in the bathroom unresponsive. Husband initiated CPR and EMS delivered 2 shocks. Patient withVF arrest underwent urgent left heart catheterization showed nonobstructive CAD. Prior to admission patient hadprofusewatery diarrhea 3 days and wasnotable to stay hydratedand was taking multiple doses of Lomotil.Husband had reported she had not been taking her blood pressure medications.Potassium level 2.1 on admission, creatinine 1.26. CT of the chest showed bilateral lung opacities concerning for edema versus pneumonia. Cranial CT scan negative. Patient did require intubation. Echocardiogram with ejection fraction of less than 20% diffuse hypokinesis. Patient was transferred to [facility] 03/15/2017 with cardiac MRI completed that was negative. Underwent ICD placement 03/17/2017 and patient since extubated. Hospital course follow-up gastroenterology for bouts of diarrhea with conservative care and consider colonoscopy  plus EGD as an outpatient. Hypokalemia has resolved latest potassium 3.7.MRSA PCR screen positive. Placed on contact precautionsPhysical therapy evaluation completed 03/18/2017 with recommendations of physical medicine  and rehabilitation consult.Patient was admitted for a comprehensive rehabilitation program  Patient does not remember certain parts of her hospitalization such as when she was intubated, she remembers a trip to the beach prior to her [facility] admission.    Patient currently requires min with basic self-care skills secondary to muscle weakness, decreased cardiorespiratoy endurance and decreased safety awareness and decreased memory.  Prior to hospitalization, patient could complete BADL/IADL with independent .  Patient will benefit from skilled intervention to increase independence with basic self-care skills prior to discharge home with care partner.  Anticipate patient will require intermittent supervision and follow up outpatient.  OT - End of Session Endurance Deficit: Yes OT Assessment Rehab Potential (ACUTE ONLY): Excellent OT Patient demonstrates impairments in the following area(s): Balance;Endurance;Safety OT Basic ADL's Functional Problem(s): Grooming;Bathing;Dressing;Toileting OT Advanced ADL's Functional Problem(s): Simple Meal Preparation;Laundry OT Transfers Functional Problem(s): Toilet;Tub/Shower OT Plan OT Intensity: Minimum of 1-2 x/day, 45 to 90 minutes OT Frequency: 5 out of 7 days OT Duration/Estimated Length of Stay: 5-7 OT Treatment/Interventions: Balance/vestibular training;Community reintegration;Disease Lawyer;Discharge planning;Functional mobility training;Patient/family education;Psychosocial support;Self Care/advanced ADL retraining;Therapeutic Exercise;UE/LE Coordination activities;UE/LE Strength taining/ROM;Therapeutic Activities OT Self Feeding Anticipated Outcome(s): MOD I OT Basic Self-Care Anticipated Outcome(s): MOD I OT Toileting Anticipated Outcome(s): MOD I toilet; S shower OT Bathroom Transfers Anticipated Outcome(s): MOD I toilet; S shower OT Recommendation Patient destination: Home Follow Up  Recommendations: Outpatient OT Equipment Recommended: Tub/shower seat;To be determined   Skilled Therapeutic Intervention 1:1. Pt educated on OT, POC, CIR, ELOS and discharge planning. Pt participates in goal setting. Pt ambualtes with HHA to transfer onto [facility] in shower with HHA. PT bathes at sit to stand level with min guard to stand to wash buttocks. Pt dons clothing seated EOB with superivison-min guard for standing while advaning pants past hips. Pt grooms seated EOB with supervision as RN hooks up IV. After grooming RN still working on IV and OT exits room to select w/c cushion for pt comfort and sitting tolerance. Pt completes stand pivot trnasfer with min guard EOB<>BSC with VC for safety awareness. Exited session with pt seated in bed iwht call light in reach and all needs met.  OT Evaluation Precautions/Restrictions  Precautions Precautions: Fall Restrictions Weight Bearing Restrictions: Yes LUE Weight Bearing: Non weight bearing Other Position/Activity Restrictions: s/p cath procedure precautions General Chart Reviewed: Yes Vital Signs   Pain Pain Assessment Pain Assessment: No/denies pain Home Living/Prior Functioning Home Living Family/patient expects to be discharged to:: Private residence Living Arrangements: Spouse/significant other Available Help at Discharge: Family, Available 24 hours/day Type of Home: Apartment Home Access: Level entry Home Layout: One level Bathroom Shower/Tub: Chiropodist: Standard IADL History Homemaking Responsibilities: Yes Meal Prep Responsibility: Primary Laundry Responsibility: Primary Cleaning Responsibility: Primary Bill Paying/Finance Responsibility: Primary Shopping Responsibility: Primary Current License: Yes Mode of Transportation: Car Occupation: Full time employment 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 ADL   Vision Baseline Vision/History: No visual deficits Patient Visual Report: No change from baseline Vision Assessment?: No apparent visual deficits Perception  Perception: Within Functional Limits Praxis Praxis:  Intact Cognition Overall Cognitive Status: Within Functional Limits for tasks assessed Arousal/Alertness: Awake/alert Orientation Level: Person;Place;Situation Person: Oriented Place: Oriented Situation: Oriented Year: 2018 Month: September Day of Week: Correct Memory: Appears intact Immediate Memory Recall: Sock;Blue;Bed Memory Recall: Sock;Blue;Bed Memory Recall Sock: Without Cue Memory Recall Blue: Without Cue Memory Recall Bed: Without Cue Attention: Selective Selective Attention: Appears intact Sensation Sensation Light Touch: Appears Intact Hot/Cold: Appears Intact Proprioception: Appears Intact Coordination Gross Motor Movements are Fluid and Coordinated: Yes Motor  Motor Motor: Within Functional Limits Mobility  Transfers Transfers: Sit to Stand;Stand to Sit Sit to Stand: 4: Min guard Stand to Sit: 4: Min guard  Trunk/Postural Assessment  Cervical Assessment Cervical Assessment: Within Functional Limits Thoracic Assessment Thoracic Assessment: Within Functional Limits Lumbar Assessment Lumbar Assessment: Within Functional Limits Postural Control Postural Control: Within Functional Limits  Balance Balance Balance Assessed: Yes Dynamic Sitting Balance Dynamic Sitting - Level of Assistance: 6: Modified independent (Device/Increase time) Sitting balance - Comments: dressing EOB Dynamic Standing Balance Dynamic Standing - Level of Assistance: 4: Min assist Dynamic Standing - Comments: dressing advancing pants past hips Extremity/Trunk Assessment RUE  Assessment RUE Assessment: Within Functional Limits LUE Assessment LUE Assessment: Exceptions to [facility] (generalized weakness no formal strength testing 2/2 NWB precautions)   See Function Navigator for Current Functional Status.   Refer to Care Plan for Long Term Goals  Recommendations for other services: Therapeutic Recreation  Kitchen group, Stress management and Outing/community reintegration   Discharge Criteria: Patient will be discharged from OT if patient refuses treatment 3 consecutive times without medical reason, if treatment goals not met, if there is a change in medical status, if patient makes no progress towards goals or if patient is discharged from hospital.  The above assessment, treatment plan, treatment alternatives and goals were discussed and mutually agreed upon: by patient  Tonny Branch 03/21/2017, 9:59 AM

## 2017-03-21 NOTE — Care Management Note (Signed)
Inpatient Rehabilitation Center Individual Statement of Services  Patient Name:  Miranda [name]  Date:  03/21/2017  Welcome to the Inpatient Rehabilitation Center.  Our goal is to provide you with an individualized program based on your diagnosis and situation, designed to meet your specific needs.  With this comprehensive rehabilitation program, you will be expected to participate in at least 3 hours of rehabilitation therapies Monday-Friday, with modified therapy programming on the weekends.  Your rehabilitation program will include the following services:  Physical Therapy (PT), Occupational Therapy (OT), 24 hour per day rehabilitation nursing, Therapeutic Recreaction (TR), Neuropsychology, Case Management (Social Worker), Rehabilitation Medicine, Nutrition Services and Pharmacy Services  Weekly team conferences will be held on Wednesday to discuss your progress.  Your Social Worker will talk with you frequently to get your input and to update you on team discussions.  Team conferences with you and your family in attendance may also be held.  Expected length of stay: 5-7 days Overall anticipated outcome: mod/i-supervision with tub and car transfers  Depending on your progress and recovery, your program may change. Your Social Worker will coordinate services and will keep you informed of any changes. Your Social Worker's name and contact numbers are listed  below.  The following services may also be recommended but are not provided by the Inpatient Rehabilitation Center:   Driving Evaluations  Home Health Rehabiltiation Services  Outpatient Rehabilitation Services  Vocational Rehabilitation   Arrangements will be made to provide these services after discharge if needed.  Arrangements include referral to agencies that provide these services.  Your insurance has been verified to be: [employer] Your primary doctor is:  Carolyne Fiscal  Pertinent information will be shared with your doctor  and your insurance company.  Social Worker:  Dossie Der, SW [phone] or ([phone]  Information discussed with and copy given to patient by: Lucy Chris, 03/21/2017, 9:36 AM

## 2017-03-22 ENCOUNTER — Inpatient Hospital Stay (HOSPITAL_COMMUNITY): Payer: BC Managed Care – PPO | Admitting: Occupational Therapy

## 2017-03-22 ENCOUNTER — Inpatient Hospital Stay (HOSPITAL_COMMUNITY): Payer: BC Managed Care – PPO

## 2017-03-22 ENCOUNTER — Inpatient Hospital Stay (HOSPITAL_COMMUNITY): Payer: BC Managed Care – PPO | Admitting: Physical Therapy

## 2017-03-22 LAB — BASIC METABOLIC PANEL
ANION GAP: 8 (ref 5–15)
BUN: 15 mg/dL (ref 6–20)
CO2: 21 mmol/L — ABNORMAL LOW (ref 22–32)
Calcium: 8.9 mg/dL (ref 8.9–10.3)
Chloride: 113 mmol/L — ABNORMAL HIGH (ref 101–111)
Creatinine, Ser: 0.62 mg/dL (ref 0.44–1.00)
GFR calc Af Amer: >60 mL/min (ref >60)
GFR calc non Af Amer: >60 mL/min (ref >60)
Glucose, Bld: 108 mg/dL — ABNORMAL HIGH (ref 65–99)
Potassium: 3.8 mmol/L (ref 3.5–5.1)
SODIUM: 142 mmol/L (ref 135–145)

## 2017-03-22 NOTE — Progress Notes (Signed)
Occupational Therapy Session Note  Patient Details  Name: Miranda [name] MRN: [id number] Date of Birth: 08/31/1960  Today's Date: 03/22/2017 OT Individual Time: 1300-1411 OT Individual Time Calculation (min): 71 min    Short Term Goals: Week 1:  OT Short Term Goal 1 (Week 1): STG=LTG at MOD I level  Skilled Therapeutic Interventions/Progress Updates:    1:1. Pt with no c/o pain and reporting K levels increased. OT propels w/c for energy conservation to/from all tx destinations. Pt bowls with supervision-CGA and retrieves pins from floor with CGA and VC to non weight bearing precautions with LUE on rail/furniture when bending over. Pt with no LOB. Pt plays wii fit balance games and requires VC for safety awareness for reach back and locking breaks during sit to stand transitions. Pt tolerates 2-3 min of standing balance/weight shifting games with CGA prior to requiring seated rest break. Pt completes step over tub transfer with min A for balance. Pt participated in discussion of bathroom set up at home and decided her husband could purchase a shower chair in prep for d/c. Pt completes 3 rounds 60 of ball passes (chest, bounce, and overhead pass) in standing with supervision with no LOB to improve functional endurance and standing balance required for ADLs. Exited sessionwtih pt seated in bed with call lgiht in reach and all needs met.  Therapy Documentation Precautions:  Precautions Precautions: Fall Precaution Comments: ICD placement on 9/10, no heavy lifting or strenuous activity w/ UEs for 6-8 weeks Restrictions Weight Bearing Restrictions: Yes LUE Weight Bearing: Non weight bearing Other Position/Activity Restrictions: s/p cath procedure precautions See Function Navigator for Current Functional Status.   Therapy/Group: Individual Therapy  Tonny Branch 03/22/2017, 2:09 PM

## 2017-03-22 NOTE — Progress Notes (Signed)
Physical Therapy Session Note  Patient Details  Name: Klover Priestly MRN: [id number] Date of Birth: [birth year]  Today's Date: 03/22/2017 PT Individual Time: 1450-1545 PT Individual Time Calculation (min): 55 min   Short Term Goals: Week 1:  PT Short Term Goal 1 (Week 1): =LTGs due to ELOS  Skilled Therapeutic Interventions/Progress Updates:   Pt supine upon arrival and agreeable to therapy, no c/o pain. Worked on functional mobility and endurance this session as pt was unable to participate in therapy last date. Pt ambulated in multiple 100-125' bouts w/ seated rest secondary to fatigue, no AD. Instructed and performed 4 stairs, car transfer, w/c mobility, and bed mobility. Additionally performed Berg Balance Scale as detailed below. NuStep @ L2, 5 min x2 to work on endurance. Moderate increase in work of breathing w/ all functional mobility, returns to baseline w/ 3-4 min rest. Ended session supine in bed, call bell within reach and all needs met.   Therapy Documentation Precautions:  Precautions Precautions: Fall Precaution Comments: ICD placement on 9/10, no heavy lifting or strenuous activity w/ UEs for 6-8 weeks Restrictions Weight Bearing Restrictions: Yes LUE Weight Bearing: Non weight bearing Other Position/Activity Restrictions: s/p cath procedure precautions Vital Signs: Therapy Vitals Temp: 98.6 F (37 C) Temp Source: Oral Pulse Rate: 74 Resp: 20 BP: (!) 106/52 Patient Position (if appropriate): Lying Oxygen Therapy SpO2: 100 % O2 Device: Not Delivered Balance: Balance Balance Assessed: Yes Standardized Balance Assessment Standardized Balance Assessment: Berg Balance Test Berg Balance Test Sit to Stand: Able to stand without using hands and stabilize independently Standing Unsupported: Able to stand safely 2 minutes Sitting with Back Unsupported but Feet Supported on Floor or Stool: Able to sit safely and securely 2 minutes Stand to Sit: Sits safely with minimal  use of hands Transfers: Able to transfer safely, minor use of hands Standing Unsupported with Eyes Closed: Able to stand 10 seconds with supervision Standing Ubsupported with Feet Together: Able to place feet together independently and stand for 1 minute with supervision From Standing, Reach Forward with Outstretched Arm: Can reach forward >12 cm safely (5") From Standing Position, Pick up Object from Floor: Able to pick up shoe, needs supervision From Standing Position, Turn to Look Behind Over each Shoulder: Looks behind from both sides and weight shifts well Turn 360 Degrees: Able to turn 360 degrees safely but slowly Standing Unsupported, Alternately Place Feet on Step/Stool: Able to complete 4 steps without aid or supervision Standing Unsupported, One Foot in Front: Able to plae foot ahead of the other independently and hold 30 seconds Standing on One Leg: Unable to try or needs assist to prevent fall Total Score: 43  See Function Navigator for Current Functional Status.   Therapy/Group: Individual Therapy  Delrose Rohwer K Arnette 03/22/2017, 3:48 PM

## 2017-03-22 NOTE — Progress Notes (Signed)
[facility]ICAL MEDICINE & REHABILITATION     PROGRESS NOTE  Subjective/Complaints:   No issues oivernite, held from therapy due to low K yesterday  ROS: Denies CP, SOB, N/V/D.  Objective: Vital Signs: Blood pressure (!) 116/56, pulse 82, temperature 97.6 F (36.4 C), temperature source Oral, resp. rate 18, height 5' (1.524 m), weight 81.7 kg (180 lb 1.9 oz), SpO2 99 %. No results found.  Recent Labs  03/21/17 0543  WBC 4.0  HGB 10.5*  HCT 32.2*  PLT 172    Recent Labs  03/21/17 0543 03/22/17 0552  NA 141 142  K 2.7* 3.8  CL 108 113*  GLUCOSE 110* 108*  BUN 11 15  CREATININE 0.60 0.62  CALCIUM 8.8* 8.9   CBG (last 3)   Recent Labs  03/20/17 0611  GLUCAP 107*    Wt Readings from Last 3 Encounters:  03/22/17 81.7 kg (180 lb 1.9 oz)  03/20/17 82.1 kg (180 lb 14.4 oz)  03/14/17 82.6 kg (182 lb 3.2 oz)    Physical Exam:  BP (!) 116/56 (BP Location: Right Arm)   Pulse 82   Temp 97.6 F (36.4 C) (Oral)   Resp 18   Ht 5' (1.524 m)   Wt 81.7 kg (180 lb 1.9 oz)   SpO2 99%   BMI 35.18 kg/m  Constitutional: She appears well-developed and well-nourished. NAD. HENT: Normocephalic. Atraumatic. Eyes: EOMare normal. No discharge.  Cardiovascular: Normal rateand regular rhythm. No JVD. Respiratory: Effort normaland breath sounds normal.  GI: Bowel sounds are normal. She exhibits no distension.  Neurological. Patient is alert and oriented.  Serial sevens intact Motor: B/l 5/5 bilateral deltoid, biceps, triceps, grip  B/l  hip flexor, knee extensor, ankle dorsiflexors 4/5.  Skin. Warm and dry. Intact. Psych: Normal mood and behavior.   Assessment/Plan: 1. Functional deficits secondary to debility which require 3+ hours per day of interdisciplinary therapy in a comprehensive inpatient rehab setting. Physiatrist is providing close team supervision and 24 hour management of active medical problems listed below. Physiatrist and rehab team continue to  assess barriers to discharge/monitor patient progress toward functional and medical goals.  Function:  Bathing Bathing position   Position: Shower  Bathing parts Body parts bathed by patient: Right arm, Left arm, Abdomen, Chest, Front perineal area, Buttocks, Right upper leg, Left upper leg, Left lower leg, Right lower leg Body parts bathed by helper: Back  Bathing assist Assist Level: Touching or steadying assistance(Pt > 75%)      Upper Body Dressing/Undressing Upper body dressing   What is the patient wearing?: Pull over shirt/dress     Pull over shirt/dress - Perfomed by patient: Thread/unthread right sleeve, Put head through opening, Pull shirt over trunk, Thread/unthread left sleeve          Upper body assist Assist Level: Supervision or verbal cues      Lower Body Dressing/Undressing Lower body dressing   What is the patient wearing?: Non-skid slipper socks, Pants     Pants- Performed by patient: Thread/unthread left pants leg, Thread/unthread right pants leg, Pull pants up/down   Non-skid slipper socks- Performed by patient: Don/doff right sock, Don/doff left sock                    Lower body assist Assist for lower body dressing: Touching or steadying assistance (Pt > 75%)      Toileting Toileting   Toileting steps completed by patient: Adjust clothing prior to toileting, Performs perineal hygiene, Adjust clothing  after toileting      Toileting assist Assist level: Touching or steadying assistance (Pt.75%)   Transfers Chair/bed transfer             Locomotion Ambulation           Wheelchair          Cognition Comprehension Comprehension assist level: Follows complex conversation/direction with no assist  Expression Expression assist level: Expresses complex ideas: With no assist  Social Interaction Social Interaction assist level: Interacts appropriately with others - No medications needed.  Problem Solving Problem solving assist  level: Solves complex problems: Recognizes & self-corrects  Memory Memory assist level: Complete Independence: No helper    Medical Problem List and Plan: 1. Decreased functional with severe deconditioning mobilitysecondary to VF cardiac arrest. 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 DVT Prophylaxis/Anticoagulation: SCDs 3. Pain Management: Tylenol as needed 4. Mood: Seroquel 25 mg daily at bedtime 5. Neuropsych: This patient iscapable of making decisions on herown behalf. 6. Skin/Wound Care: Routine skin checks 7. Fluids/Electrolytes/Nutrition: Routine I&Os 8.Hypertension. Coreg 3.125 mg twice a day,  Monitor with increased mobility 9.Diarrhea. Resolved. Follow-up outpatient gastroenterology 10.Hypokalemia.   Critical value 2.7 on 9/14- improved to 3.8 on 9/15  Supplementing IV and PO   11.MRSA PCR screening positive. Contact precautions. 12. Hypoalbuminemia  Supplement initiated 9/14 13. ABLA  Hb 10.5 on 9/14  Cont to monitor  LOS (Days) 2 A FACE TO FACE EVALUATION WAS PERFORMED  Claudette Laws E 03/22/2017 8:15 AM

## 2017-03-22 NOTE — Progress Notes (Signed)
Occupational Therapy Session Note  Patient Details  Name: Miranda [name] MRN: [id number] Date of Birth: [birth year]  Today's Date: 03/22/2017 OT Individual Time:  -       Short Term Goals: Week 1:  OT Short Term Goal 1 (Week 1): STG=LTG at MOD I level  Skilled Therapeutic Interventions/Progress Updates:    Tx focus on functional ambulation without device, endurance, and standing tolerance during leisure participation.   Bedrest orders lifted.   Pt greeted supine in bed. Declining B/D and self care tasks. She ambulated with hand held assist to therapy apartment with extra time and intermittent use of railing. While in apartment, had her engage in crocheting/cross stitching activities in seated and standing. She was able to stand for 2-3 minute windows before fatiguing and needing to sit. Cued her to stand without elbow support. Played 80s music to increase volition. Throughout task, educated her on using meaningful IADL/leisure activities at d/c to further improve balance/standing tolerance at home. Also discussed energy conservation strategies to continue engagement in meaningful life activities. She then ambulated to toilet with hand held assist to void, washed her hands at sink, and sat back on couch to rest. All sit<stands from low couch completed with Min A and min cues for NWB L UE. Pt then ambulated back to room and returned to bed, per request. She was left with all needs within reach and cross stitch activity to complete in her spare time.   Therapy Documentation Precautions:  Precautions Precautions: Fall Precaution Comments: ICD placement on 9/10, no heavy lifting or strenuous activity w/ UEs for 6-8 weeks Restrictions Weight Bearing Restrictions: Yes LUE Weight Bearing: Non weight bearing Other Position/Activity Restrictions: s/p cath procedure precautions Vital Signs: Therapy Vitals Temp: 97.6 F (36.4 C) Temp Source: Oral Pulse Rate: 70 Resp: 18 BP: (!) 94/58 Patient  Position (if appropriate): Lying Oxygen Therapy SpO2: 99 % O2 Device: Not Delivered Pain: No c/o pain during tx    ADL:      See Function Navigator for Current Functional Status.   Therapy/Group: Individual Therapy  Fay Bagg A Felita Bump 03/22/2017, 7:31 AM

## 2017-03-23 ENCOUNTER — Inpatient Hospital Stay (HOSPITAL_COMMUNITY): Payer: BC Managed Care – PPO

## 2017-03-23 NOTE — Progress Notes (Signed)
[facility] PHYSICAL MEDICINE & REHABILITATION     PROGRESS NOTE  Subjective/Complaints:   No issues overnite  ROS: Denies CP, SOB, N/V/D.  Objective: Vital Signs: Blood pressure (!) 119/59, pulse 69, temperature 98.2 F (36.8 C), temperature source Oral, resp. rate 18, height 5' (1.524 m), weight 83.5 kg (184 lb), SpO2 99 %. No results found.  Recent Labs  03/21/17 0543  WBC 4.0  HGB 10.5*  HCT 32.2*  PLT 172    Recent Labs  03/21/17 0543 03/22/17 0552  NA 141 142  K 2.7* 3.8  CL 108 113*  GLUCOSE 110* 108*  BUN 11 15  CREATININE 0.60 0.62  CALCIUM 8.8* 8.9   CBG (last 3)  No results for input(s): GLUCAP in the last 72 hours.  Wt Readings from Last 3 Encounters:  03/23/17 83.5 kg (184 lb)  03/20/17 82.1 kg (180 lb 14.4 oz)  03/14/17 82.6 kg (182 lb 3.2 oz)    Physical Exam:  BP (!) 119/59 (BP Location: Right Arm)   Pulse 69   Temp 98.2 F (36.8 C) (Oral)   Resp 18   Ht 5' (1.524 m)   Wt 83.5 kg (184 lb)   SpO2 99%   BMI 35.94 kg/m  Constitutional: She appears well-developed and well-nourished. NAD. HENT: Normocephalic. Atraumatic. Eyes: EOMare normal. No discharge.  Cardiovascular: Normal rateand regular rhythm. No JVD. Respiratory: Effort normaland breath sounds normal.  GI: Bowel sounds are normal. She exhibits no distension.  Neurological. Patient is alert and oriented.  Serial sevens intact Motor: B/l 5/5 bilateral deltoid, biceps, triceps, grip  B/l  hip flexor, knee extensor, ankle dorsiflexors 4/5.  Skin. Warm and dry. Intact. Psych: Normal mood and behavior.   Assessment/Plan: 1. Functional deficits secondary to debility which require 3+ hours per day of interdisciplinary therapy in a comprehensive inpatient rehab setting. Physiatrist is providing close team supervision and 24 hour management of active medical problems listed below. Physiatrist and rehab team continue to assess barriers to discharge/monitor patient progress toward  functional and medical goals.  Function:  Bathing Bathing position   Position: Shower  Bathing parts Body parts bathed by patient: Right arm, Left arm, Abdomen, Chest, Front perineal area, Buttocks, Right upper leg, Left upper leg, Left lower leg, Right lower leg Body parts bathed by helper: Back  Bathing assist Assist Level: Touching or steadying assistance(Pt > 75%)      Upper Body Dressing/Undressing Upper body dressing   What is the patient wearing?: Pull over shirt/dress     Pull over shirt/dress - Perfomed by patient: Thread/unthread right sleeve, Put head through opening, Pull shirt over trunk, Thread/unthread left sleeve          Upper body assist Assist Level: Supervision or verbal cues      Lower Body Dressing/Undressing Lower body dressing   What is the patient wearing?: Non-skid slipper socks, Pants     Pants- Performed by patient: Thread/unthread left pants leg, Thread/unthread right pants leg, Pull pants up/down   Non-skid slipper socks- Performed by patient: Don/doff right sock, Don/doff left sock                    Lower body assist Assist for lower body dressing: Touching or steadying assistance (Pt > 75%)      Toileting Toileting   Toileting steps completed by patient: Adjust clothing prior to toileting, Performs perineal hygiene, Adjust clothing after toileting      Toileting assist Assist level: Touching or steadying assistance (  Pt.75%)   Transfers Chair/bed transfer   Chair/bed transfer method: Stand pivot, Ambulatory Chair/bed transfer assist level: Touching or steadying assistance (Pt > 75%)       Locomotion Ambulation     Max distance: 125' Assist level: Touching or steadying assistance (Pt > 75%)   Wheelchair   Type: Manual Max wheelchair distance: 150' Assist Level: Supervision or verbal cues  Cognition Comprehension Comprehension assist level: Follows complex conversation/direction with no assist  Expression Expression  assist level: Expresses complex ideas: With no assist  Social Interaction Social Interaction assist level: Interacts appropriately with others - No medications needed.  Problem Solving Problem solving assist level: Solves complex problems: Recognizes & self-corrects  Memory Memory assist level: Complete Independence: No helper    Medical Problem List and Plan: 1. Decreased functional with severe deconditioning mobilitysecondary to VF cardiac arrest. 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 DVT Prophylaxis/Anticoagulation: SCDs 3. Pain Management: Tylenol as needed 4. Mood: Seroquel 25 mg daily at bedtime 5. Neuropsych: This patient iscapable of making decisions on herown behalf. 6. Skin/Wound Care: Routine skin checks 7. Fluids/Electrolytes/Nutrition: Routine I&Os 8.Hypertension. Coreg 3.125 mg twice a day,  controlled Vitals:   03/22/17 1422 03/23/17 0500  BP: (!) 106/52 (!) 119/59  Pulse: 74 69  Resp: 20 18  Temp: 98.6 F (37 C) 98.2 F (36.8 C)  SpO2: 100% 99%   9.Diarrhea. Resolved. Follow-up outpatient gastroenterology 10.Hypokalemia. Repeat BMET in am  Critical value 2.7 on 9/14- improved to 3.8 on 9/15     11.MRSA PCR screening positive. Contact precautions. 12. Hypoalbuminemia  Supplement initiated 9/14 13. ABLA- CBC in am Hb 10.5 on 9/14  Cont to monitor  LOS (Days) 3 A FACE TO FACE EVALUATION WAS PERFORMED  Erick Colace 03/23/2017 7:53 AM

## 2017-03-23 NOTE — Progress Notes (Signed)
Occupational Therapy Session Note  Patient Details  Name: Miranda [name] MRN: [id number] Date of Birth: 02-09-1961  Today's Date: 03/23/2017 OT Individual Time: 8889-1694 OT Individual Time Calculation (min): 45 min    Short Term Goals: Week 1:  OT Short Term Goal 1 (Week 1): STG=LTG at MOD I level  Skilled Therapeutic Interventions/Progress Updates:    Pt resting EOB upon arrival.  Pt requested that NT assisted with bathing/dressing.  Pt amb without AD from room to elevators before resting.  Pt amb into day room and initially used Nustep for 5 mins (work load 4) before transitioning to Biodex and engaged in catch X 4 without use of handles.  Pt transitioned to dynavision X 4 (1 min increments) on compliant and noncompliant surfaces (ave reaction of 1/15 seconds). Pt amb back to room with seated rest break X 1 and standing rest break X 2. Pt remained seated EOB with all needs within reach.   Therapy Documentation Precautions:  Precautions Precautions: Fall Precaution Comments: ICD placement on 9/10, no heavy lifting or strenuous activity w/ UEs for 6-8 weeks Restrictions Weight Bearing Restrictions: Yes LUE Weight Bearing: Non weight bearing Other Position/Activity Restrictions: s/p cath procedure precautions  Pain:  Pt denies pain  See Function Navigator for Current Functional Status.   Therapy/Group: Individual Therapy  Rich Brave 03/23/2017, 11:18 AM

## 2017-03-24 ENCOUNTER — Inpatient Hospital Stay (HOSPITAL_COMMUNITY): Payer: BC Managed Care – PPO | Admitting: Occupational Therapy

## 2017-03-24 ENCOUNTER — Inpatient Hospital Stay (HOSPITAL_COMMUNITY): Payer: BC Managed Care – PPO

## 2017-03-24 LAB — BASIC METABOLIC PANEL
ANION GAP: 9 (ref 5–15)
BUN: 13 mg/dL (ref 6–20)
CALCIUM: 9.3 mg/dL (ref 8.9–10.3)
CO2: 21 mmol/L — ABNORMAL LOW (ref 22–32)
Chloride: 110 mmol/L (ref 101–111)
Creatinine, Ser: 0.66 mg/dL (ref 0.44–1.00)
Glucose, Bld: 165 mg/dL — ABNORMAL HIGH (ref 65–99)
POTASSIUM: 3.5 mmol/L (ref 3.5–5.1)
SODIUM: 140 mmol/L (ref 135–145)

## 2017-03-24 LAB — CBC WITH DIFFERENTIAL/PLATELET
BASOS ABS: 0 10*3/uL (ref 0.0–0.1)
BASOS PCT: 1 %
Eosinophils Absolute: 0.2 10*3/uL (ref 0.0–0.7)
Eosinophils Relative: 4 %
HEMATOCRIT: 35.6 % — AB (ref 36.0–46.0)
Hemoglobin: 11.5 g/dL — ABNORMAL LOW (ref 12.0–15.0)
LYMPHS PCT: 40 %
Lymphs Abs: 1.8 10*3/uL (ref 0.7–4.0)
MCH: 30.3 pg (ref 26.0–34.0)
MCHC: 32.3 g/dL (ref 30.0–36.0)
MCV: 93.7 fL (ref 78.0–100.0)
Monocytes Absolute: 0.3 10*3/uL (ref 0.1–1.0)
Monocytes Relative: 7 %
NEUTROS ABS: 2.1 10*3/uL (ref 1.7–7.7)
Neutrophils Relative %: 48 %
PLATELETS: 178 10*3/uL (ref 150–400)
RBC: 3.8 MIL/uL — AB (ref 3.87–5.11)
RDW: 15.6 % — AB (ref 11.5–15.5)
WBC: 4.4 10*3/uL (ref 4.0–10.5)

## 2017-03-24 NOTE — Progress Notes (Signed)
[facility] PHYSICAL MEDICINE & REHABILITATION     PROGRESS NOTE  Subjective/Complaints:  Pt seen sitting up in bed this AM.  She slept well overnight.  She states she is doing well and had a good weekend.   ROS: Denies CP, SOB, N/V/D.  Objective: Vital Signs: Blood pressure (!) 135/58, pulse 76, temperature 99.4 F (37.4 C), temperature source Oral, resp. rate 18, height 5' (1.524 m), weight 83.9 kg (185 lb), SpO2 99 %. No results found. No results for input(s): WBC, HGB, HCT, PLT in the last 72 hours.  Recent Labs  03/22/17 0552  NA 142  K 3.8  CL 113*  GLUCOSE 108*  BUN 15  CREATININE 0.62  CALCIUM 8.9   CBG (last 3)  No results for input(s): GLUCAP in the last 72 hours.  Wt Readings from Last 3 Encounters:  03/24/17 83.9 kg (185 lb)  03/20/17 82.1 kg (180 lb 14.4 oz)  03/14/17 82.6 kg (182 lb 3.2 oz)    Physical Exam:  BP (!) 135/58 (BP Location: Right Arm)   Pulse 76   Temp 99.4 F (37.4 C) (Oral)   Resp 18   Ht 5' (1.524 m)   Wt 83.9 kg (185 lb)   SpO2 99%   BMI 36.13 kg/m  Constitutional: She appears well-developed and well-nourished. NAD. HENT: Normocephalic. Atraumatic. Eyes: EOMare normal. No discharge.  Cardiovascular: RRR. No JVD. Respiratory: Effort normal and breath sounds normal.  GI: Bowel sounds are normal. She exhibits no distension.  Neurological. Patient is alert and oriented.  Serial sevens intact Motor: B/l 5/5 bilateral deltoid, biceps, triceps, grip  B/l  hip flexor, knee extensor, ankle dorsiflexors 4-/5.  Skin. Warm and dry. Intact. Psych: Normal mood and behavior.   Assessment/Plan: 1. Functional deficits secondary to debility which require 3+ hours per day of interdisciplinary therapy in a comprehensive inpatient rehab setting. Physiatrist is providing close team supervision and 24 hour management of active medical problems listed below. Physiatrist and rehab team continue to assess barriers to discharge/monitor patient  progress toward functional and medical goals.  Function:  Bathing Bathing position   Position: Shower  Bathing parts Body parts bathed by patient: Right arm, Left arm, Abdomen, Chest, Front perineal area, Buttocks, Right upper leg, Left upper leg, Left lower leg, Right lower leg Body parts bathed by helper: Back  Bathing assist Assist Level: Touching or steadying assistance(Pt > 75%)      Upper Body Dressing/Undressing Upper body dressing   What is the patient wearing?: Pull over shirt/dress     Pull over shirt/dress - Perfomed by patient: Thread/unthread right sleeve, Put head through opening, Pull shirt over trunk, Thread/unthread left sleeve          Upper body assist Assist Level: Supervision or verbal cues      Lower Body Dressing/Undressing Lower body dressing   What is the patient wearing?: Non-skid slipper socks, Pants     Pants- Performed by patient: Thread/unthread left pants leg, Thread/unthread right pants leg, Pull pants up/down   Non-skid slipper socks- Performed by patient: Don/doff right sock, Don/doff left sock                    Lower body assist Assist for lower body dressing: Touching or steadying assistance (Pt > 75%)      Toileting Toileting   Toileting steps completed by patient: Adjust clothing prior to toileting, Performs perineal hygiene, Adjust clothing after toileting      Toileting assist Assist level:  Touching or steadying assistance (Pt.75%)   Transfers Chair/bed transfer   Chair/bed transfer method: Stand pivot, Ambulatory Chair/bed transfer assist level: Touching or steadying assistance (Pt > 75%)       Locomotion Ambulation     Max distance: 125' Assist level: Touching or steadying assistance (Pt > 75%)   Wheelchair   Type: Manual Max wheelchair distance: 150' Assist Level: Supervision or verbal cues  Cognition Comprehension Comprehension assist level: Follows complex conversation/direction with no assist   Expression Expression assist level: Expresses complex ideas: With no assist  Social Interaction Social Interaction assist level: Interacts appropriately with others - No medications needed.  Problem Solving Problem solving assist level: Solves complex problems: Recognizes & self-corrects  Memory Memory assist level: Complete Independence: No helper    Medical Problem List and Plan: 1. Decreased functional with severe deconditioning mobilitysecondary to VF cardiac arrest. 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 DVT Prophylaxis/Anticoagulation: SCDs 3. Pain Management: Tylenol as needed 4. Mood: Seroquel 25 mg daily at bedtime 5. Neuropsych: This patient iscapable of making decisions on herown behalf. 6. Skin/Wound Care: Routine skin checks 7. Fluids/Electrolytes/Nutrition: Routine I&Os 8.Hypertension. Coreg 3.125 mg twice a day,  Relaitvely controlled 9/17 Vitals:   03/23/17 1400 03/24/17 0500  BP: (!) 122/47 (!) 135/58  Pulse: 72 76  Resp: 20 18  Temp: 98.3 F (36.8 C) 99.4 F (37.4 C)  SpO2: 98% 99%   9.Diarrhea. Resolved. Follow-up outpatient gastroenterology 10.Hypokalemia.   K+ 3.8 on 9/15  Labs pending for today  11.MRSA PCR screening positive. Contact precautions. 12. Hypoalbuminemia  Supplement initiated 9/14 13. ABLA  Hb 10.5 on 9/14  Labs pending for today  Cont to monitor  LOS (Days) 4 A FACE TO FACE EVALUATION WAS PERFORMED  Ankit Karis Juba 03/24/2017 8:05 AM

## 2017-03-24 NOTE — Progress Notes (Signed)
Occupational Therapy Session Note  Patient Details  Name: Miranda [name] MRN: [id number] Date of Birth: 1961/05/04  Today's Date: 03/24/2017 OT Individual Time: 1300-1400 OT Individual Time Calculation (min): 60 min    Short Term Goals: Week 1:  OT Short Term Goal 1 (Week 1): STG=LTG at MOD I level  Skilled Therapeutic Interventions/Progress Updates:    Pt seen for OT session focusing on functional activity tolerance and prep for community re-integration. Pt sitting EOB upon arrival, agreeable to tx session.  She ambulated throughout session with close supervision- guarding assist, occasional scoissoring noted and pt with decreased functional ambulation speed. She ambulated off unit to outside court yard, navigating uneven terrain. She was able to self initiate seated rest breaks with min cuing. Provided education throughout session regarding energy conservation, importance of prioritizing tasks, and return to PLOF and work. Pt returned to unit and in therapy gym completed x2 sets of 10 seated straight leg lifts and x2 sets of 10 knee raises with #3 ankle weight donned. Supine on mat, completed glute bridge with hamstring curl using physio ball x1 set of 10 and in sidelying "clam shell openings" x1 set of 10. VCs provided throughout for proper form and technique and breathing sequencing.  Pt returned to room at end of session, left seated EOB with all needs  In reach.   Therapy Documentation Precautions:  Precautions Precautions: Fall Precaution Comments: ICD placement on 9/10, no heavy lifting or strenuous activity w/ UEs for 6-8 weeks Restrictions Weight Bearing Restrictions: Yes LUE Weight Bearing: Non weight bearing Other Position/Activity Restrictions: s/p cath procedure precautions Pain:   No/ denies pain  See Function Navigator for Current Functional Status.   Therapy/Group: Individual Therapy  Lewis, Alucard Fearnow C 03/24/2017, 7:21 AM

## 2017-03-24 NOTE — Progress Notes (Signed)
Physical Therapy Session Note  Patient Details  Name: Miranda [name] MRN: [id number] Date of Birth: 1960-07-29  Today's Date: 03/24/2017 PT Individual Time: 0800-0855 PT Individual Time Calculation (min): 55 min   Short Term Goals: Week 1:  PT Short Term Goal 1 (Week 1): =LTGs due to ELOS  Skilled Therapeutic Interventions/Progress Updates:    Pt supine in bed upon PT arrival, agreeable to therapy tx and denies pain. Pt ambulated throughout unit x 100 ft, x 120 ft, x 150 ft and x 200 ft without an AD and with min assist, verbal cues to correct narrow base of support during gait to prevent LOB. Pt performed Sit<>stands x 10 without UE support, working on LE strengthening and activity tolerance. Pt performed Tandem gait forwards/backwards in parallel bars with intermittent UE support to stabilize working on dynamic balance, x 4 trials. Pt used  cybex kinetron x 2 trials in sitting, x 2 trials in standing for 2-3 minute bouts working on LE strengthening and endurance training. Pt navigated uneven surfaces and a curb with min assist, working on dynamic standing balance. Pt left sitting EOB with RN present, needs in reach.    Therapy Documentation Precautions:  Precautions Precautions: Fall Precaution Comments: ICD placement on 9/10, no heavy lifting or strenuous activity w/ UEs for 6-8 weeks Restrictions Weight Bearing Restrictions: Yes LUE Weight Bearing: Non weight bearing Other Position/Activity Restrictions: s/p cath procedure precautions   See Function Navigator for Current Functional Status.   Therapy/Group: Individual Therapy  Cresenciano Genre, PT, DPT 03/24/2017, 8:56 AM

## 2017-03-24 NOTE — Progress Notes (Signed)
Occupational Therapy Session Note  Patient Details  Name: Miranda [name] MRN: [id number] Date of Birth: April 09, 1961  Today's Date: 03/24/2017 OT Individual Time: 1045-1200 OT Individual Time Calculation (min): 75 min    Short Term Goals: Week 1:  OT Short Term Goal 1 (Week 1): STG=LTG at MOD I level  Skilled Therapeutic Interventions/Progress Updates:    Pt seen this session for ADL training with a focus on safe mobility. Pt stated that her legs felt very weak today and her knees were "shakey".  She ambulated in and out of bathroom to toilet and then to shower but needed steadying A in and out of shower.  In shower, she used shower chair most of the time and completed bathing with S.  She walked back to bed to dress.  She then ambulated without AD to gym with occasional touching A. In gym, sat on mat and focused on LE exercises that she could do from her bed when she is in between therapies. Worked on supine ball squeezes between knees with knees in flexion, knee drops toward each side with ball b/w knees, leg extensions, and bridges. In sidelying, clamshell and side leg lifts for hip abductors. Pt found these exercises very challenging and could only do 5 reps at a time. In sitting, knee extension for quad strength.  Pt ambulated back to room with all needs met.   Therapy Documentation Precautions:  Precautions Precautions: Fall Precaution Comments: ICD placement on 9/10, no heavy lifting or strenuous activity w/ UEs for 6-8 weeks Restrictions Weight Bearing Restrictions: Yes LUE Weight Bearing: Non weight bearing Other Position/Activity Restrictions: s/p cath procedure precautions  Pain: Pain Assessment Pain Assessment: No/denies pain ADL:    See Function Navigator for Current Functional Status.   Therapy/Group: Individual Therapy  [place] 03/24/2017, 12:07 PM

## 2017-03-25 ENCOUNTER — Inpatient Hospital Stay (HOSPITAL_COMMUNITY): Payer: BC Managed Care – PPO | Admitting: Occupational Therapy

## 2017-03-25 ENCOUNTER — Inpatient Hospital Stay (HOSPITAL_COMMUNITY): Payer: BC Managed Care – PPO

## 2017-03-25 ENCOUNTER — Inpatient Hospital Stay (HOSPITAL_COMMUNITY): Payer: BC Managed Care – PPO | Admitting: Physical Therapy

## 2017-03-25 ENCOUNTER — Encounter (HOSPITAL_COMMUNITY): Payer: BC Managed Care – PPO | Admitting: Psychology

## 2017-03-25 MED ORDER — POTASSIUM CHLORIDE CRYS ER 20 MEQ PO TBCR
40.0000 meq | EXTENDED_RELEASE_TABLET | Freq: Once | ORAL | Status: AC
Start: 2017-03-25 — End: 2017-03-25
  Administered 2017-03-25: 40 meq via ORAL
  Filled 2017-03-25: qty 2

## 2017-03-25 MED ORDER — POTASSIUM CHLORIDE CRYS ER 20 MEQ PO TBCR
20.0000 meq | EXTENDED_RELEASE_TABLET | Freq: Every day | ORAL | Status: DC
  Administered 2017-03-26 – 2017-03-28 (×3): 20 meq via ORAL
  Filled 2017-03-25 (×4): qty 1

## 2017-03-25 NOTE — Consult Note (Signed)
Neuropsychological Consultation   Patient:   Miranda [name]   DOB:   07-28-1960  MR Number:  [id number]  Location:  [facility] [address] [facility] [id number] [place] [address] Dept: [phone] Loc: [phone]           Date of Service:   03/25/2017  Start Time:   3 PM End Time:   4 PM  Provider/Observer:  Arley Phenix, Psy.D.       Clinical Neuropsychologist       Billing Code/Service: [phone] 4 Units  Chief Complaint:    Miranda [name] is a 56 year old female with a history of hypertension, migraine headaches. The patient presented to [facility] on 02/26/2017. The patient was immediately found down by her husband in the bathroom unresponsive. Her husband initiated CPR and EMS was immediately called. She received 2 shocks initially. Patient was in VF arrest and underwent urgent left heart catheterization. She had nonobstructive CAD. The patient had been having GI symptoms for 3 days prior to this event and was having difficulty staying hydrated. She had also stopped taking her blood pressure medications during that time. Patient required intubation and initial tries at extubation failed but over time she was able to be extubated without receiving a trach.  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 She had a daytime job that she worked 3 days a week as well as a nighttime job working 3 hours a week prior to this event.  Reason for Service:  Miranda [name] was referred for neuropsychological consultation due to concerns about possible residual effects of an anoxic event/hypoxic event. While extensive neuropsychological testing was not being needed at this time mental status and clinical interview were conducted. Below is the full history of present illness for the current admission HPI: Miranda [name] a 56 y.o.right  handed femalewith history of hypertension, migraine headaches. Per chart review patient lives with spouse. One level apartment. 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 Husband works during the day.Presented to [facility] 08/22/2018after being found down by her husband in the bathroom unresponsive. Husband initiated CPR and EMS delivered 2 shocks. Patient withVF arrest underwent urgent left heart catheterization showed nonobstructive CAD. Prior to admission patient hadprofusewatery diarrhea 3 days and wasnotable to stay hydratedand was taking multiple doses of Lomotil.Husband had reported she had not been taking her blood pressure medications.Potassium level 2.1 on admission, creatinine 1.26. CT of the chest showed bilateral lung opacities concerning for edema versus pneumonia. Cranial CT scan negative. Patient did require intubation. Echocardiogram with ejection fraction of less than 20% diffuse hypokinesis. Patient was transferred to [facility] 03/15/2017 with cardiac MRI completed that was negative. Underwent ICD placement 03/17/2017 and patient since extubated. Hospital course follow-up gastroenterology for bouts of diarrhea with conservative care and consider colonoscopy plus EGD as an outpatient. Hypokalemia has resolved latest potassium 3.7.MRSA PCR screen positive. Placed on contact precautionsPhysical therapy evaluation completed 03/18/2017 with recommendations of physical medicine and rehabilitation consult.Patient was admitted for a comprehensive rehabilitation program  Current Status:  The patient did quite well on mental status exam showing good memory and good awareness for current events and mental status 4. The patient reports that she feels like her memory has returned to baseline and that her overall speed of mental operations were at baseline. The patient's receptive and expressive language  appeared to be quite effective and efficient. The patient does  report that she is unable to remember the events immediately surrounding her cardiac arrest but does remember events even when she was still intubated. She reports that she has full memory for events over the past week and was able to effectively tell me what should been having for meals going back several days and when nurses are checking on her for vitals and other events that have happened in the hospital.  Behavioral Observation: Miranda [name]  presents as a 56 y.o.-year-old Right Caucasian Female who appeared her stated age. her dress was Appropriate and she was Well Groomed and her manners were Appropriate to the situation.  her participation was indicative of Appropriate and Attentive behaviors.  There were not any physical disabilities noted.  she displayed an appropriate level of cooperation and motivation.     Interactions:    Active Appropriate and Attentive  Attention:   within normal limits and attention span and concentration were age appropriate  Memory:   within normal limits; recent and remote memory intact  Visuo-spatial:  within normal limits  Speech (Volume):  normal  Speech:   normal; normal  Thought Process:  Coherent and Relevant  Though Content:  WNL; not suicidal  Orientation:   person, place, time/date and situation  Judgment:   Good  Planning:   Good  Affect:    Excited  Mood:    Euthymic  Insight:   Good  Intelligence:   high  Current Employment: The patient currently works for [employer] in the [employer] as well as having 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  Substance Use:  No concerns of substance abuse are reported.    Education:   Control and instrumentation engineer History:   Past Medical History:  Diagnosis Date  . Chronic systolic CHF (congestive heart failure) (HCC)    a. TTE 02/26/2017: EF < 20%, diffuse HK, mild AI, RV sys fxn mildly reduced; b. TTE 03/03/2017: EF 55-60%, nl WM, mild AI, PASP 46  . Coronary  artery disease, non-occlusive    a. LHC 02/25/2017: no angiographically significant CAD, EF <35%, moderately elevated LVEDP at 28 mmHg  . Dependent edema    bilateral legs  . Hot flashes   . HTN (hypertension)   . Migraines   . Nausea   . Ventricular fibrillation (HCC)    a. 02/25/2017: LHC without signficant CAD; b. idiopathic; c. possibly 2/2 diarrhea with electrolyte abnormalities with Imodium usage       Family Med/Psych History:  Family History  Problem Relation Age of Onset  . Lymphoma Mother   . Heart disease Father   . Stroke Father   . Hyperlipidemia Father   . Hypertension Father   . Hypertension Sister   . Migraines Sister   . Hypertension Brother     Risk of Suicide/Violence: virtually non-existent   Impression/DX:  Miranda [name] is a 56 year old female with a history of hypertension, migraine headaches. The patient presented to [facility] on 02/26/2017. The patient was immediately found down by her husband in the bathroom unresponsive. Her husband initiated CPR and EMS was immediately called. She received 2 shocks initially. Patient was in VF arrest and underwent urgent left heart catheterization. She had nonobstructive CAD. The patient had been having GI symptoms for 3 days prior to this event and was having difficulty staying hydrated. She had also stopped taking her blood pressure medications  during that time. Patient required intubation and initial tries at extubation failed but over time she was able to be extubated without receiving a trach.  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 She had a daytime job that she worked 3 days a week as well as a nighttime job working 3 hours a week prior to this event.  The patient did quite well on mental status exam showing good memory and good awareness for current events and mental status 4. The patient reports that she feels  like her memory has returned to baseline and that her overall speed of mental operations were at baseline. The patient's receptive and expressive language appeared to be quite effective and efficient. The patient does report that she is unable to remember the events immediately surrounding her cardiac arrest but does remember events even when she was still intubated. She reports that she has full memory for events over the past week and was able to effectively tell me what should been having for meals going back several days and when nurses are checking on her for vitals and other events that have happened in the hospital.         Electronically Signed   _______________________ Arley Phenix, Psy.D.

## 2017-03-25 NOTE — Progress Notes (Signed)
Physical Therapy Session Note  Patient Details  Name: Miranda [name] MRN: [id number] Date of Birth: February 03, 1961  Today's Date: 03/25/2017 PT Individual Time: 1000-1027 PT Individual Time Calculation (min): 27 min   Short Term Goals: Week 1:  PT Short Term Goal 1 (Week 1): =LTGs due to ELOS  Skilled Therapeutic Interventions/Progress Updates:   Pt sitting EOB and agreeable to therapy, no c/o pain. Worked on LLE strengthening and NMR this session. Pt c/o wobbling on LLE during gait and has a noticeable L hip hike and mild circumduction on L w/ gait, decreased balance in L single leg stance. Worked on dynamic standing balance w/ L on foam and R on step w/ emphasis on LLE weight-bearing and balance. 1-2 LOB which pt corrected by sitting down on mat. Performed 5x sit<>stands x2 bouts w/ emphasis on LLE support, no UE support during stands. Ambulated >200' back to room w/ supervision and pt reports decreased "wobble" and has less hip hike and circumduction as well. Returned to room and instructed and educated pt on performing supine bridges during breaks in between therapy to increase R/L hip strength in weight-bearing position. Pt performed correctly and w/o pain. Ended session supine, call bell within [name] and all needs met.   Therapy Documentation Precautions:  Precautions Precautions: Fall Precaution Comments: ICD placement on 9/10, no heavy lifting or strenuous activity w/ UEs for 6-8 weeks Restrictions Weight Bearing Restrictions: No LUE Weight Bearing: Non weight bearing Other Position/Activity Restrictions: s/p cath procedure precautions  See Function Navigator for Current Functional Status.   Therapy/Group: Individual Therapy  Shamir Tuzzolino K Arnette 03/25/2017, 10:56 AM

## 2017-03-25 NOTE — Progress Notes (Signed)
[facility] PHYSICAL MEDICINE & REHABILITATION     PROGRESS NOTE  Subjective/Complaints:  Pt seen sitting up in bed this AM.  She slept well overnight.  She is enjoying therapies.   ROS: Denies CP, SOB, N/V/D.  Objective: Vital Signs: Blood pressure 129/64, pulse 83, temperature 98.6 F (37 C), temperature source Oral, resp. rate 18, height 5' (1.524 m), weight 80.9 kg (178 lb 5.6 oz), SpO2 96 %. No results found.  Recent Labs  03/24/17 0917  WBC 4.4  HGB 11.5*  HCT 35.6*  PLT 178    Recent Labs  03/24/17 0917  NA 140  K 3.5  CL 110  GLUCOSE 165*  BUN 13  CREATININE 0.66  CALCIUM 9.3   CBG (last 3)  No results for input(s): GLUCAP in the last 72 hours.  Wt Readings from Last 3 Encounters:  03/25/17 80.9 kg (178 lb 5.6 oz)  03/20/17 82.1 kg (180 lb 14.4 oz)  03/14/17 82.6 kg (182 lb 3.2 oz)    Physical Exam:  BP 129/64 (BP Location: Left Arm)   Pulse 83   Temp 98.6 F (37 C) (Oral)   Resp 18   Ht 5' (1.524 m)   Wt 80.9 kg (178 lb 5.6 oz)   SpO2 96%   BMI 34.83 kg/m  Constitutional: She appears well-developed and well-nourished. NAD. HENT: Normocephalic. Atraumatic. Eyes: EOMare normal. No discharge.  Cardiovascular: RRR. No JVD. Respiratory: Effort normal and breath sounds normal.  GI: Bowel sounds are normal. She exhibits no distension.  Neurological. Patient is alert and oriented.  Serial sevens intact Motor: B/l 5/5 bilateral deltoid, biceps, triceps, grip  B/l  hip flexor, knee extensor, ankle dorsiflexors 4-/5.  Skin. Warm and dry. Intact. Psych: Normal mood and behavior.   Assessment/Plan: 1. Functional deficits secondary to debility which require 3+ hours per day of interdisciplinary therapy in a comprehensive inpatient rehab setting. Physiatrist is providing close team supervision and 24 hour management of active medical problems listed below. Physiatrist and rehab team continue to assess barriers to discharge/monitor patient progress  toward functional and medical goals.  Function:  Bathing Bathing position   Position: Shower  Bathing parts Body parts bathed by patient: Right arm, Left arm, Abdomen, Chest, Front perineal area, Buttocks, Right upper leg, Left upper leg, Left lower leg, Right lower leg Body parts bathed by helper: Back  Bathing assist Assist Level: Supervision or verbal cues      Upper Body Dressing/Undressing Upper body dressing   What is the patient wearing?: Pull over shirt/dress     Pull over shirt/dress - Perfomed by patient: Thread/unthread right sleeve, Put head through opening, Pull shirt over trunk, Thread/unthread left sleeve          Upper body assist Assist Level: No help, No cues      Lower Body Dressing/Undressing Lower body dressing   What is the patient wearing?: Pants, Underwear, Socks, Shoes Underwear - Performed by patient: Thread/unthread right underwear leg, Thread/unthread left underwear leg, Pull underwear up/down   Pants- Performed by patient: Thread/unthread left pants leg, Thread/unthread right pants leg, Pull pants up/down   Non-skid slipper socks- Performed by patient: Don/doff right sock, Don/doff left sock   Socks - Performed by patient: Don/doff right sock, Don/doff left sock   Shoes - Performed by patient: Don/doff right shoe, Don/doff left shoe, Fasten right, Fasten left            Lower body assist Assist for lower body dressing: Supervision or verbal cues  Toileting Toileting   Toileting steps completed by patient: Adjust clothing prior to toileting, Performs perineal hygiene, Adjust clothing after toileting Toileting steps completed by helper: Performs perineal hygiene, Adjust clothing after toileting (per Delena Bali Aqu[place], NT)    Toileting assist Assist level: Supervision or verbal cues   Transfers Chair/bed transfer   Chair/bed transfer method: Stand pivot, Ambulatory Chair/bed transfer assist level: Touching or steadying assistance (Pt  > 75%)       Locomotion Ambulation     Max distance: 200 ft Assist level: Touching or steadying assistance (Pt > 75%)   Wheelchair   Type: Manual Max wheelchair distance: 150' Assist Level: Supervision or verbal cues  Cognition Comprehension Comprehension assist level: Follows complex conversation/direction with no assist  Expression Expression assist level: Expresses complex ideas: With no assist  Social Interaction Social Interaction assist level: Interacts appropriately with others - No medications needed.  Problem Solving Problem solving assist level: Solves complex problems: Recognizes & self-corrects  Memory Memory assist level: Complete Independence: No helper    Medical Problem List and Plan: 1. Decreased functional with severe deconditioning mobilitysecondary to VF cardiac arrest. 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 DVT Prophylaxis/Anticoagulation: SCDs 3. Pain Management: Tylenol as needed 4. Mood: Seroquel 25 mg daily at bedtime 5. Neuropsych: This patient iscapable of making decisions on herown behalf. 6. Skin/Wound Care: Routine skin checks 7. Fluids/Electrolytes/Nutrition: Routine I&Os 8.Hypertension. Coreg 3.125 mg twice a day,  Relaitvely controlled 9/18 Vitals:   03/24/17 1431 03/25/17 0546  BP: 103/86 129/64  Pulse: 85 83  Resp: 18 18  Temp: 98.6 F (37 C) 98.6 F (37 C)  SpO2: 100% 96%   9.Diarrhea. Resolved. Follow-up outpatient gastroenterology 10.Hypokalemia.   K+ 3.5 on 9/17  Cont to monitor  Daily supplement initiated 11.MRSA PCR screening positive. Contact precautions. 12. Hypoalbuminemia  Supplement initiated 9/14 13. ABLA  Hb 11.5 on 9/17  Cont to monitor  LOS (Days) 5 A FACE TO FACE EVALUATION WAS PERFORMED  Ankit Karis Juba 03/25/2017 8:17 AM

## 2017-03-25 NOTE — Progress Notes (Signed)
Physical Therapy Session Note  Patient Details  Name: Miranda [name] MRN: [id number] Date of Birth: 05-06-1961  Today's Date: 03/25/2017 PT Individual Time: 0800-0900, 1100-1155 PT Individual Time Calculation (min): 60 min, 55 min   Short Term Goals: Week 1:  PT Short Term Goal 1 (Week 1): =LTGs due to ELOS  Skilled Therapeutic Interventions/Progress Updates:    Session 1: Pt supine in bed upon PT arrival, agreeable to therapy tx and denies pain this session. Pt transferred from supine to sitting EOB and donned shoes with supervision. Pt ambulated x 250 ft to the day room with min assist, pt still with occasional minimal LOB during gait. Pt used the nustep x 10 minutes on workload 3 to work on LE strengthening, activity tolerance and endurance. Pt ambulated into bathroom and performed all toileting with supervision. Pt ambulated within hospital on even surfaces 2 x 200 ft with min assist to stabilize. Pt ambulated outside on uneven surfaces, on the side walk, grass and down the stairs with min assist 200 ft. Pt worked on dynamic balance to perform toe taps on 3 inch step with min assist, more difficulty performing toe taps with L LE and standing on R LE. Pt ambulated back to room and left seated edge of bed with RN present.   Session 2: Pt supine in bed upon PT arrival, agreeable to therapy tx and denies pain. Pt ambulated within the unit, bouts >200 ft with supervision to min assist for occasional stabilization. Session focused on dynamic balance activities. PT performed toe taps on colored cones with min assist, without UE support, x 2 trials. Pt ambulated to the dayroom from the gym while carrying object in both hands with min assist. Pt used biodex balance system to work on limits of stability training: 22 % trials 1, 34 % trial 2 and 45% trial 3, all without UE support. Pt standing on foam with feet apart/eyes open x 30 sec, feet together eyes open x 30 sec, feet apart eyes closed 30 sec and feet  together eyes closed 30 sec with increased ankle strategy and sway when eyes closed. Pt performed side stepping 4 x 15 ft without UE support. Pt performed stepping in all directions as called out, working on dynamic standing balance x 3 minutes. Pt left seated in room with needs in reach.    Therapy Documentation Precautions:  Precautions Precautions: Fall Precaution Comments: ICD placement on 9/10, no heavy lifting or strenuous activity w/ UEs for 6-8 weeks Restrictions Weight Bearing Restrictions: No LUE Weight Bearing: Non weight bearing Other Position/Activity Restrictions: s/p cath procedure precautions   See Function Navigator for Current Functional Status.   Therapy/Group: Individual Therapy  Cresenciano Genre, PT, DPT 03/25/2017, 7:53 AM

## 2017-03-25 NOTE — Progress Notes (Signed)
Occupational Therapy Session Note  Patient Details  Name: Miranda [name] MRN: [id number] Date of Birth: September 09, 1960  Today's Date: 03/25/2017 OT Individual Time: 1300-1357 OT Individual Time Calculation (min): 57 min    Short Term Goals: Week 1:  OT Short Term Goal 1 (Week 1): STG=LTG at MOD I level  Skilled Therapeutic Interventions/Progress Updates:   Pt completed shower and dressing to start the session.  Supervision for all aspects including undressing in standing position with use of one UE stabilized on a chair.  She completed shower with supervision sit to stand on the shower seat and then transitioned to dressing from sit to stand.  She was able to again donn underpants with supervision in standing position but needed to transition to sitting to donn pull up pants.  Once completed she donned shoes, already tied as well with supervision.  Completed drying hair and brushing it in standing to finish ADL.  Next had pt ambulate to the dayroom for use of the Biodex to increase balance reactions.  She was able to score 37% and 46% on Random Control program.  Min demonstrational cueing for using hip and ankle strategies instead of flexing and rotating trunk.  She also completed Limits of Stability with a score of 87%.  Two rest breaks needed during Biodex use with oxygen sats at 97% and HR at 85.  She next ambulated back to the room with close supervision and no assistive device to complete session.  Pt left sitting on EOB with call button and phone in reach.     Therapy Documentation Precautions:  Precautions Precautions: Fall Precaution Comments: ICD placement on 9/10, no heavy lifting or strenuous activity w/ UEs for 6-8 weeks Restrictions Weight Bearing Restrictions: No LUE Weight Bearing: Non weight bearing Other Position/Activity Restrictions: s/p cath procedure precautions   Pain: Pain Assessment Pain Assessment: No/denies pain ADL: See Function Navigator for Current Functional  Status.   Therapy/Group: Individual Therapy  Naol Ontiveros OTR/L 03/25/2017, 3:49 PM

## 2017-03-26 ENCOUNTER — Inpatient Hospital Stay (HOSPITAL_COMMUNITY): Payer: BC Managed Care – PPO

## 2017-03-26 ENCOUNTER — Inpatient Hospital Stay (HOSPITAL_COMMUNITY): Payer: BC Managed Care – PPO | Admitting: Occupational Therapy

## 2017-03-26 NOTE — Progress Notes (Signed)
[place] PHYSICAL MEDICINE & REHABILITATION     PROGRESS NOTE  Subjective/Complaints:  Pt seen sitting up in bed this AM.  She slept well overnight.  She notes improvement.   ROS: Denies CP, SOB, N/V/D.  Objective: Vital Signs: Blood pressure 135/90, pulse 86, temperature 98.6 F (37 C), temperature source Oral, resp. rate 18, height 5' (1.524 m), weight 81.9 kg (180 lb 8.9 oz), SpO2 100 %. No results found.  Recent Labs  03/24/17 0917  WBC 4.4  HGB 11.5*  HCT 35.6*  PLT 178    Recent Labs  03/24/17 0917  NA 140  K 3.5  CL 110  GLUCOSE 165*  BUN 13  CREATININE 0.66  CALCIUM 9.3   CBG (last 3)  No results for input(s): GLUCAP in the last 72 hours.  Wt Readings from Last 3 Encounters:  03/26/17 81.9 kg (180 lb 8.9 oz)  03/20/17 82.1 kg (180 lb 14.4 oz)  03/14/17 82.6 kg (182 lb 3.2 oz)    Physical Exam:  BP 135/90 (BP Location: Right Arm)   Pulse 86   Temp 98.6 F (37 C) (Oral)   Resp 18   Ht 5' (1.524 m)   Wt 81.9 kg (180 lb 8.9 oz)   SpO2 100%   BMI 35.26 kg/m  Constitutional: She appears well-developed and well-nourished. NAD. HENT: Normocephalic. Atraumatic. Eyes: EOMare normal. No discharge.  Cardiovascular: RRR. No JVD. Respiratory: Effort normal and breath sounds normal.  GI: Bowel sounds are normal. She exhibits no distension.  Neurological. Patient is alert and oriented.  Serial sevens intact Motor: B/l 5/5 bilateral deltoid, biceps, triceps, grip  B/l  hip flexor, knee extensor, ankle dorsiflexors 4/5.  Skin. Warm and dry. Intact. Psych: Normal mood and behavior.   Assessment/Plan: 1. Functional deficits secondary to debility which require 3+ hours per day of interdisciplinary therapy in a comprehensive inpatient rehab setting. Physiatrist is providing close team supervision and 24 hour management of active medical problems listed below. Physiatrist and rehab team continue to assess barriers to discharge/monitor patient progress toward  functional and medical goals.  Function:  Bathing Bathing position   Position: Shower  Bathing parts Body parts bathed by patient: Right arm, Left arm, Abdomen, Chest, Front perineal area, Buttocks, Right upper leg, Left upper leg, Left lower leg, Right lower leg Body parts bathed by helper: Back  Bathing assist Assist Level: Supervision or verbal cues      Upper Body Dressing/Undressing Upper body dressing   What is the patient wearing?: Pull over shirt/dress     Pull over shirt/dress - Perfomed by patient: Thread/unthread right sleeve, Put head through opening, Pull shirt over trunk, Thread/unthread left sleeve          Upper body assist Assist Level: More than reasonable time      Lower Body Dressing/Undressing Lower body dressing   What is the patient wearing?: Pants, Underwear, Socks, Shoes Underwear - Performed by patient: Thread/unthread right underwear leg, Thread/unthread left underwear leg, Pull underwear up/down   Pants- Performed by patient: Thread/unthread left pants leg, Thread/unthread right pants leg, Pull pants up/down   Non-skid slipper socks- Performed by patient: Don/doff right sock, Don/doff left sock   Socks - Performed by patient: Don/doff right sock, Don/doff left sock   Shoes - Performed by patient: Don/doff right shoe, Don/doff left shoe, Fasten right, Fasten left            Lower body assist Assist for lower body dressing: Supervision or verbal cues  Toileting Toileting   Toileting steps completed by patient: Adjust clothing prior to toileting, Performs perineal hygiene, Adjust clothing after toileting Toileting steps completed by helper: Performs perineal hygiene, Adjust clothing after toileting (per Delena Bali [place], NT)    Toileting assist Assist level: Touching or steadying assistance (Pt.75%)   Transfers Chair/bed transfer   Chair/bed transfer method: Stand pivot, Ambulatory Chair/bed transfer assist level: Supervision or  verbal cues       Locomotion Ambulation     Max distance: >200 ft Assist level: Supervision or verbal cues   Wheelchair   Type: Manual Max wheelchair distance: 150' Assist Level: Supervision or verbal cues  Cognition Comprehension Comprehension assist level: Follows complex conversation/direction with no assist  Expression Expression assist level: Expresses complex ideas: With no assist  Social Interaction Social Interaction assist level: Interacts appropriately with others - No medications needed.  Problem Solving Problem solving assist level: Solves complex problems: Recognizes & self-corrects  Memory Memory assist level: Complete Independence: No helper    Medical Problem List and Plan: 1. Decreased functional with severe deconditioning mobilitysecondary to VF cardiac arrest. 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 DVT Prophylaxis/Anticoagulation: SCDs 3. Pain Management: Tylenol as needed 4. Mood: Seroquel 25 mg daily at bedtime 5. Neuropsych: This patient iscapable of making decisions on herown behalf. 6. Skin/Wound Care: Routine skin checks 7. Fluids/Electrolytes/Nutrition: Routine I&Os 8.Hypertension. Coreg 3.125 mg twice a day  Relaitvely controlled 9/19 Vitals:   03/25/17 0546 03/26/17 0609  BP: 129/64 135/90  Pulse: 83 86  Resp: 18 18  Temp: 98.6 F (37 C) 98.6 F (37 C)  SpO2: 96% 100%   9.Diarrhea. Resolved. Follow-up outpatient gastroenterology 10.Hypokalemia.   K+ 3.5 on 9/17  Cont to monitor  Daily supplement initiated 11.MRSA PCR screening positive. Contact precautions. 12. Hypoalbuminemia  Supplement initiated 9/14 13. ABLA  Hb 11.5 on 9/17  Cont to monitor  LOS (Days) 6 A FACE TO FACE EVALUATION WAS PERFORMED  Tarvaris Puglia Karis Juba 03/26/2017 7:29 AM

## 2017-03-26 NOTE — Progress Notes (Signed)
Physical Therapy Session Note  Patient Details  Name: Miranda [name] MRN: [id number] Date of Birth: [birth year]  Today's Date: 03/26/2017 PT Individual Time: 0800-0857, 1615-1700 PT Individual Time Calculation (min): 57 min , 45 min  Short Term Goals: Week 1:  PT Short Term Goal 1 (Week 1): =LTGs due to ELOS  Skilled Therapeutic Interventions/Progress Updates:   Session 1: Pt supine in bed upon PT arrival, agreeable to therapy tx and denies pain. Pt transferred from supine to sitting with supervision. Pt ambulated within the unit this session >200 ft with supervision. Pt used the nustep x 8 minutes on workload 4 for endurance training and LE strengthening. Pt ambulated to the bathroom and completed all toileting with supervision. Pt ambulated in bouts of 250 ft x 3 with rest breaks in between working on ambulating on uneven surfaces outside. Pt worked on dynamic standing balance to complete ball toss x 2 trial reaching outside of BOS. Pt worked on dynamic balance and balance reactions while standing on the rocker boeard and shifting side to side, shifting forward/back, and maintaining balance with perturbations. Pt ambulated back to room and left seated in room with needs in reach.   Session 2: Pt sitting in bed upon PT arrival, agreeable to therapy tx and denies pain. Pt ambulated into bathroom with supervision to complete toileting. Pt ambulated to dayroom without AD and with supervision working on changing gait speeds from fast to slow. Pt used nustep x 10 minutes for LE strengthening and endurance, on level 4. Pt performed LE strengthening exercises: mini squats 1 x 10, wall squats 1 x 10, bridges 2 x 10, hip abduction 2 x 10. Working on dynamic balance pt bouncing ball back and forth, pt standing on bosu while stacking cups. Pt ambulated within unit>26ft with supervision. Pt left seated EOB with needs in reach.   Therapy Documentation Precautions:  Precautions Precautions: Fall Precaution  Comments: ICD placement on 9/10, no heavy lifting or strenuous activity w/ UEs for 6-8 weeks Restrictions Weight Bearing Restrictions: Yes LUE Weight Bearing: Non weight bearing Other Position/Activity Restrictions: s/p cath procedure precautions   See Function Navigator for Current Functional Status.   Therapy/Group: Individual Therapy  Cresenciano Genre, PT, DPT 03/26/2017, 8:18 AM

## 2017-03-26 NOTE — Progress Notes (Signed)
   03/26/17 1200  Clinical Encounter Type  Visited With Patient  Visit Type Initial (AD)  Referral From Patient  Consult/Referral To Chaplain  Spiritual Encounters  Spiritual Needs Prayer  Stress Factors  Patient Stress Factors Major life changes  Family Stress Factors Not reviewed  Advance Directives (For Healthcare)  Does Patient Have a Medical Advance Directive? Yes (Completed AD)  Patient wanted to complete AD. When she had done her part, called for notary and witnesses. Made copies and Patient wanted to have it faxed to her daughter. Faxed from Spiritual Care office and gave her the copy of what was faxed Phebe Colla, Chaplain

## 2017-03-26 NOTE — Progress Notes (Signed)
Occupational Therapy Session Note  Patient Details  Name: Miranda [name] MRN: [id number] Date of Birth: 02/25/1961  Today's Date: 03/26/2017 OT Individual Time: 1005-1100 OT Individual Time Calculation (min): 55 min    Short Term Goals: Week 1:  OT Short Term Goal 1 (Week 1): STG=LTG at MOD I level  Skilled Therapeutic Interventions/Progress Updates:    Pt completed functional mobility to the therapy gym with supervision.  Pt walking slower than normal with wider BOS during mobility.  Once in the gym utilized the Wii with balance board on weightshifts, ankle strategies, and hip strategies.  Integrated games as well with standing on foam surface, using staggered stepping, and with use of BOSU.  Pt needing min assist to get up on the BOSO but able to maintain balance while engaged in activity with min guard assist.  Pt did exhibit some decreased activation abdominal core in standing so needed cueing for neutral pelvic tilt.  Had pt clean up balance mat and Wii activity at end of session, with overall supervision.  Finished session with return to the room with call button and phone in reach.    Therapy Documentation Precautions:  Precautions Precautions: Fall Precaution Comments: ICD placement on 9/10, no heavy lifting or strenuous activity w/ UEs for 6-8 weeks Restrictions Weight Bearing Restrictions: No LUE Weight Bearing: Non weight bearing Other Position/Activity Restrictions: s/p cath procedure precautions   Pain: Pain Assessment Pain Assessment: No/denies pain Pain Score: 0-No pain Faces Pain Scale: No hurt ADL: See Function Navigator for Current Functional Status.   Therapy/Group: Individual Therapy  Niomie Englert OTR/L 03/26/2017, 12:15 PM

## 2017-03-26 NOTE — Progress Notes (Signed)
Occupational Therapy Session Note  Patient Details  Name: Miranda [name] MRN: [id number] Date of Birth: Nov 09, 1960  Today's Date: 03/26/2017 OT Individual Time: 1440-1504 OT Individual Time Calculation (min): 24 min    Short Term Goals: Week 1:  OT Short Term Goal 1 (Week 1): STG=LTG at MOD I level  Skilled Therapeutic Interventions/Progress Updates:    Pt completed functional mobility to the tub/shower room during session.  She was able to step over into the tub with modified independence using her UEs for support on the bathroom wall.  She performed multiple attempts of stepping over as well without use of her UEs but needed min guard assist to complete.  Transitioned to the hallway and therapy gym for next part of session.  Had pt work on walking heel to toe both forward and backwards.  Occasional LOB in both directions, but pt able to exhibit stepping strategy appropriately.  Had her stand on rocker board for further work on balance.  She needed min assist for stepping on and off with close supervision for static standing.  Incorporated self ball toss and catch which she was able to complete 75% of the time with close supervision, but did exhibit 2-3 losses of balance when she threw the ball over her head, requiring min assist for balance.  Returned to room at end of session with pt ambulating while tossing ball back and forth with therapist and close supervision.  Pt left sitting on EOB with call button and phone in reach.      Therapy Documentation Precautions:  Precautions Precautions: Fall Precaution Comments: ICD placement on 9/10, no heavy lifting or strenuous activity w/ UEs for 6-8 weeks Restrictions Weight Bearing Restrictions: No LUE Weight Bearing: Non weight bearing Other Position/Activity Restrictions: s/p cath procedure precautions  Pain: Pain Assessment Pain Assessment: No/denies pain ADL: See Function Navigator for Current Functional Status.   Therapy/Group:  Individual Therapy  Crescencio Jozwiak  OTR/L 03/26/2017, 3:49 PM

## 2017-03-27 ENCOUNTER — Inpatient Hospital Stay (HOSPITAL_COMMUNITY): Payer: BC Managed Care – PPO | Admitting: Physical Therapy

## 2017-03-27 ENCOUNTER — Inpatient Hospital Stay (HOSPITAL_COMMUNITY): Payer: BC Managed Care – PPO | Admitting: Occupational Therapy

## 2017-03-27 ENCOUNTER — Ambulatory Visit: Payer: BC Managed Care – PPO

## 2017-03-27 ENCOUNTER — Inpatient Hospital Stay (HOSPITAL_COMMUNITY): Payer: BC Managed Care – PPO

## 2017-03-27 NOTE — Progress Notes (Addendum)
Physical Therapy Discharge Summary  Patient Details  Name: Miranda [name] MRN: [id number] Date of Birth: Jan 20, 1961  Today's Date: 03/27/2017 PT Individual Time: 7619-5093 AND  PT Individual Time Calculation (min): 60 min AND  Session 1:  Pt in bed upon arrival and agreeable to therapy, no c/o pain. Worked on functional mobility as detailed below in discharge note including bed mobility, gait, transfers, and stairs. Additionally worked on endurance while ambulating to/from outside, 300+ ft w/ 1 seated rest break outside and standing rest breaks on the way to/from secondary to fatigue. 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 Pt able to perform all tasks w/ independence while moving around kitchen and standing for an extended period of time w/o rest breaks. Moderate increase in work of breathing that resolves w/ rest after all bouts of mobility. Ended session sitting EOB, call bell within reach and all needs met.   Session 2:  Pt in bed upon arrival and agreeable to therapy, no c/o pain. Pt ambulated to/from day room at Mod I level to work on endurance. In day room, she maintained static standing for 20+ minutes working on art task at high table to work on tolerance to standing. Mild increase in fatigue w/ standing that resolves w/ rest, pt required no verbal cues to take rest when needed. 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 Pt verbalized understanding and in agreement. Ended session sitting EOB, call bell within reach and all needs met.    Patient has met 6 of 6 long term goals due to improved activity tolerance and improved balance.  Patient to discharge at an ambulatory level Modified Independent.   Patient's care partner is independent to provide the necessary physical assistance at discharge.  Reasons goals not met:  n/a  Recommendation:  Patient will benefit from ongoing skilled PT services in outpatient setting to continue to advance safe functional mobility, address ongoing impairments in cardiovascular endurance, balance, gait, and minimize fall risk.  Equipment: No equipment provided  Reasons for discharge: treatment goals met and discharge from hospital  Patient/family agrees with progress made and goals achieved: Yes  PT Discharge Precautions/Restrictions Precautions Precautions: Fall Precaution Comments: ICD placement on 9/10, no heavy lifting or strenuous activity w/ UEs for 6-8 weeks Restrictions Weight Bearing Restrictions: Yes LUE Weight Bearing: Non weight bearing Other Position/Activity Restrictions: s/p cath procedure precautions Pain Pain Assessment Pain Assessment: No/denies pain Vision/Perception  Perception Perception: Within Functional Limits Praxis Praxis: Intact  Cognition Overall Cognitive Status: Within Functional Limits for tasks assessed Arousal/Alertness: Awake/alert Orientation Level: Oriented X4 Attention: Alternating Alternating Attention: Appears intact Memory: Appears intact Awareness: Appears intact Problem Solving: Appears intact Safety/Judgment: Appears intact Sensation Sensation Light Touch: Appears Intact Stereognosis: Appears Intact Hot/Cold: Appears Intact Proprioception: Appears Intact Coordination Gross Motor Movements are Fluid and Coordinated: Yes Fine Motor Movements are Fluid and Coordinated: Yes Motor  Motor Motor: Within Functional Limits Motor - Discharge Observations: Generalized fatigue/deconditioning   Mobility Bed Mobility Bed Mobility: Rolling Right;Rolling Left;Supine to Sit;Sit to Supine Rolling Right: 7: Independent Rolling Left: 7: Independent Supine to Sit: 7: Independent Sit to Supine: 7: Independent Transfers Transfers: Yes Sit to Stand: 6: Modified independent (Device/Increase time) Stand to Sit: 6: Modified  independent (Device/Increase time) Locomotion  Ambulation Ambulation: Yes Ambulation/Gait Assistance: 6: Modified independent (Device/Increase time) Ambulation Distance (Feet): 300 Feet Assistive device: None Gait  Gait: Yes Gait Pattern: Impaired Gait Pattern: Wide base of support (wobble bilaterally) Gait velocity: decreased  High Level Ambulation High Level Ambulation: Side stepping;Backwards walking;Direction changes;Sudden stops;Head turns;Toe walking;Heel walking Side Stepping: decreased speed and concentration Backwards Walking: [facility] Direction Changes: WFL Sudden Stops: WFL Head Turns: WFL Toe Walking: decreased R/L gastroc strength preventing heels lifting far off the ground  Heel Walking: [employer] Stairs / Additional Locomotion Stairs: Yes Stairs Assistance: 6: Modified independent (Device/Increase time) Stair Management Technique: Two rails Number of Stairs: 12 Height of Stairs: 6 Wheelchair Mobility Wheelchair Mobility: No  Trunk/Postural Assessment  Cervical Assessment Cervical Assessment: Within Functional Limits Thoracic Assessment Thoracic Assessment: Within Functional Limits Lumbar Assessment Lumbar Assessment: Within Functional Limits Postural Control Postural Control: Within Functional Limits  Balance Balance Balance Assessed: Yes Static Sitting Balance Static Sitting - Balance Support: No upper extremity supported Static Sitting - Level of Assistance: 7: Independent Dynamic Sitting Balance Dynamic Sitting - Level of Assistance: 7: Independent Static Standing Balance Static Standing - Balance Support: No upper extremity supported Static Standing - Level of Assistance: 7: Independent Dynamic Standing Balance Dynamic Standing - Level of Assistance: 7: Independent Extremity Assessment  RUE Assessment RUE Assessment: Within Functional Limits LUE Assessment LUE Assessment: Within Functional Limits (due to restrictions, pt is not allowed to lift heavy  objects with L arm or bear wt through arm) RLE Assessment RLE Assessment: Within Functional Limits LLE Assessment LLE Assessment: Within Functional Limits   See Function Navigator for Current Functional Status.  Miranda [name] 03/27/2017, 7:00 PM

## 2017-03-27 NOTE — Patient Care Conference (Signed)
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 Acute blood loss anemia   . Hypoalbuminemia due to protein-calorie malnutrition (HCC)   . Benign essential HTN   . Debility   . AKI (acute kidney injury) (HCC) 03/14/2017  . Diarrhea 03/14/2017  . Bradycardia   . Cardiac arrest with ventricular fibrillation (HCC) 02/26/2017  . Cardiac arrest (HCC) 02/26/2017  . Acute respiratory failure (HCC)   . Hypokalemia   . Encounter for central line placement   . Cardiogenic shock (HCC)   . Acute pulmonary edema (HCC)   . Anoxic encephalopathy (HCC)   . Hypertension 08/16/2016  . Edema 08/16/2016  . Nausea 08/16/2016  . Class 2 obesity due to excess calories without serious comorbidity with body mass index (BMI) of 35.0 to 35.9 in adult 08/16/2016    Expected Discharge Date: Expected Discharge Date: 03/28/17  Team Members Present: Physician leading conference: Dr. Maryla Morrow Social Worker Present: Amada Jupiter, LCSW Nurse Present: Chrissie Noa, RN PT Present: Woodfin Ganja, PT OT Present: Perrin Maltese, OT PPS Coordinator present : Tora Duck, RN, CRRN     Current Status/Progress Goal Weekly Team Focus  Medical   Decreased functional with severe deconditioning mobility secondary to VF cardiac arrest. 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 Min assist for gait up to 250 ft without AD  Mod I for all mobility   gait training, balance re-training, LE strengthening, d/c planning   Communication             Safety/Cognition/ Behavioral Observations  Patient understands safety measures in place and has remained free from fall   keep patient free from falls/ injury  Continue to monitor patie;s safety needs   Pain   Patient reports pain scale of 0 on 0/10 pain scale  Keep pain level within acceptable level to patient  continue to moitor pain per process   Skin   patient's skin is free from infection and skin is intact  Keep patinet's skin intact and free form infection  Continue to monitor skin    Rehab Goals Patient on target to meet rehab goals: Yes *See Care Plan and progress notes for long and short-term goals.     Barriers to Discharge  Current Status/Progress Possible Resolutions Date Resolved   Physician    Medical stability     See above  Therapies, supplemting K+, progressing well  Nursing                  PT                    OT                  SLP                SW                Discharge Planning/Teaching Needs:  Home with family to share in providing any needed support  NA - has mod ind goals   Team Discussion:  Making good gains and on track to reach mod ind goals.  No concerns about d/c and recommend OPPT follow up.  Revisions to Treatment Plan:  None    Continued Need for Acute Rehabilitation Level of Care: The patient requires daily medical management by a physician with specialized training in  physical medicine and rehabilitation for the following conditions: Daily direction of a multidisciplinary physical rehabilitation program to ensure safe treatment while eliciting the highest outcome that is of practical value to the patient.: Yes Daily medical management of patient stability for increased activity during participation in an intensive rehabilitation regime.: Yes Daily analysis of laboratory values and/or radiology reports with any subsequent need for medication adjustment of medical intervention for : Cardiac problems;Other  Miranda [name] 03/27/2017, 11:57 AM

## 2017-03-27 NOTE — Discharge Instructions (Signed)
Inpatient Rehab Discharge Instructions  Miranda [name] Discharge date and time: No discharge date for patient encounter.   Activities/Precautions/ Functional Status: Activity: activity as tolerated Diet: regular diet Wound Care: none needed Functional status:  ___ No restrictions     ___ Walk up steps independently ___ 24/7 supervision/assistance   ___ Walk up steps with assistance ___ Intermittent supervision/assistance  ___ Bathe/dress independently ___ Walk with walker     _x__ Bathe/dress with assistance ___ Walk Independently    ___ Shower independently ___ Walk with assistance    ___ Shower with assistance ___ No alcohol     ___ Return to work/school ________   COMMUNITY REFERRALS UPON DISCHARGE:    Outpatient: PT                  Agency:  [facility] Phone: [phone]                Appointment Date/Time: 9/27 @ 10:15 am for evaluation - please arrive 15 mins prior to appt.  (The remained of your appointment will be prior to 9 am)       Special Instructions: No driving   My questions have been answered and I understand these instructions. I will adhere to these goals and the provided educational materials after my discharge from the hospital.  Patient/Caregiver Signature _______________________________ Date __________  Clinician Signature _______________________________________ Date __________  Please bring this form and your medication list with you to all your follow-up doctor's appointments.

## 2017-03-27 NOTE — Progress Notes (Signed)
[place] PHYSICAL MEDICINE & REHABILITATION     PROGRESS NOTE  Subjective/Complaints:  Pt seen sitting up in bed this AM.  She slept well overnight.  She is looking forward to d/c tomorrow.  ROS: Denies CP, SOB, N/V/D.  Objective: Vital Signs: Blood pressure 121/75, pulse 80, temperature 98.2 F (36.8 C), temperature source Oral, resp. rate 18, height 5' (1.524 m), weight 80.3 kg (177 lb), SpO2 99 %. No results found.  Recent Labs  03/24/17 0917  WBC 4.4  HGB 11.5*  HCT 35.6*  PLT 178    Recent Labs  03/24/17 0917  NA 140  K 3.5  CL 110  GLUCOSE 165*  BUN 13  CREATININE 0.66  CALCIUM 9.3   CBG (last 3)  No results for input(s): GLUCAP in the last 72 hours.  Wt Readings from Last 3 Encounters:  03/27/17 80.3 kg (177 lb)  03/20/17 82.1 kg (180 lb 14.4 oz)  03/14/17 82.6 kg (182 lb 3.2 oz)    Physical Exam:  BP 121/75 (BP Location: Right Arm)   Pulse 80   Temp 98.2 F (36.8 C) (Oral)   Resp 18   Ht 5' (1.524 m)   Wt 80.3 kg (177 lb)   SpO2 99%   BMI 34.57 kg/m  Constitutional: She appears well-developed and well-nourished. NAD. HENT: Normocephalic. Atraumatic. Eyes: EOMare normal. No discharge.  Cardiovascular: RRR. No JVD. Respiratory: Effort normal and breath sounds normal.  GI: Bowel sounds are normal. She exhibits no distension.  Neurological. Patient is alert and oriented.  Motor: B/l 5/5 bilateral deltoid, biceps, triceps, grip  B/l  hip flexor, knee extensor, ankle dorsiflexors 4-4+/5.  Skin. Warm and dry. Intact. Psych: Normal mood and behavior.   Assessment/Plan: 1. Functional deficits secondary to debility which require 3+ hours per day of interdisciplinary therapy in a comprehensive inpatient rehab setting. Physiatrist is providing close team supervision and 24 hour management of active medical problems listed below. Physiatrist and rehab team continue to assess barriers to discharge/monitor patient progress toward functional and  medical goals.  Function:  Bathing Bathing position   Position: Shower  Bathing parts Body parts bathed by patient: Right arm, Left arm, Abdomen, Chest, Front perineal area, Buttocks, Right upper leg, Left upper leg, Left lower leg, Right lower leg Body parts bathed by helper: Back  Bathing assist Assist Level: Supervision or verbal cues      Upper Body Dressing/Undressing Upper body dressing   What is the patient wearing?: Pull over shirt/dress     Pull over shirt/dress - Perfomed by patient: Thread/unthread right sleeve, Put head through opening, Pull shirt over trunk, Thread/unthread left sleeve          Upper body assist Assist Level: More than reasonable time      Lower Body Dressing/Undressing Lower body dressing   What is the patient wearing?: Pants, Underwear, Socks, Shoes Underwear - Performed by patient: Thread/unthread right underwear leg, Thread/unthread left underwear leg, Pull underwear up/down   Pants- Performed by patient: Thread/unthread left pants leg, Thread/unthread right pants leg, Pull pants up/down   Non-skid slipper socks- Performed by patient: Don/doff right sock, Don/doff left sock   Socks - Performed by patient: Don/doff right sock, Don/doff left sock   Shoes - Performed by patient: Don/doff right shoe, Don/doff left shoe, Fasten right, Fasten left            Lower body assist Assist for lower body dressing: Supervision or verbal cues      Interior and spatial designer  Toileting steps completed by patient: Adjust clothing prior to toileting, Performs perineal hygiene, Adjust clothing after toileting Toileting steps completed by helper: Performs perineal hygiene, Adjust clothing after toileting (per Delena Bali [place], NT)    Toileting assist Assist level: Touching or steadying assistance (Pt.75%)   Transfers Chair/bed transfer   Chair/bed transfer method: Stand pivot, Ambulatory Chair/bed transfer assist level: Supervision or verbal cues        Locomotion Ambulation     Max distance: >200 ft Assist level: Supervision or verbal cues   Wheelchair   Type: Manual Max wheelchair distance: 150' Assist Level: Supervision or verbal cues  Cognition Comprehension Comprehension assist level: Follows complex conversation/direction with no assist  Expression Expression assist level: Expresses complex ideas: With no assist  Social Interaction Social Interaction assist level: Interacts appropriately with others - No medications needed.  Problem Solving Problem solving assist level: Solves complex problems: Recognizes & self-corrects  Memory Memory assist level: Complete Independence: No helper    Medical Problem List and Plan: 1. Decreased functional with severe deconditioning mobilitysecondary to VF cardiac arrest. 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 DVT Prophylaxis/Anticoagulation: SCDs 3. Pain Management: Tylenol as needed 4. Mood: Seroquel 25 mg daily at bedtime 5. Neuropsych: This patient iscapable of making decisions on herown behalf. 6. Skin/Wound Care: Routine skin checks 7. Fluids/Electrolytes/Nutrition: Routine I&Os 8.Hypertension. Coreg 3.125 mg twice a day  Relaitvely controlled 9/20 Vitals:   03/26/17 1328 03/27/17 0519  BP: (!) 128/51 121/75  Pulse: 77 80  Resp: 17 18  Temp: 98.2 F (36.8 C) 98.2 F (36.8 C)  SpO2: 100% 99%   9.Diarrhea. Resolved. Follow-up outpatient gastroenterology 10.Hypokalemia.   K+ 3.5 on 9/17  Cont to monitor  Daily supplement initiated  Labs ordered for tomorrow 11.MRSA PCR screening positive. Contact precautions. 12. Hypoalbuminemia  Supplement initiated 9/14 13. ABLA  Hb 11.5 on 9/17  Cont to monitor  LOS (Days) 7 A FACE TO FACE EVALUATION WAS PERFORMED  Ankit Karis Juba 03/27/2017 8:15 AM

## 2017-03-27 NOTE — Discharge Summary (Signed)
Discharge summary job [id number]

## 2017-03-27 NOTE — Discharge Summary (Signed)
NAME:  Miranda [name], Miranda [name]                     ACCOUNT NO.:  MEDICAL RECORD NO.:  [id number]  LOCATION:                                 FACILITY:  PHYSICIAN:  Maryla Morrow, MD        DATE OF BIRTH:  [birth year]  DATE OF ADMISSION:  03/20/2017 DATE OF DISCHARGE:  03/28/2017                              DISCHARGE SUMMARY   DISCHARGE DIAGNOSES: 1. Deconditioning secondary to ventricular fibrillation/cardiac     arrest, status post ICD placement. 2. Sequential compression devices for deep vein thrombosis     prophylaxis. 3. Mood. 4. Hypertension. 5. Diarrhea, resolved. 6. Hypokalemia, resolved. 7. MRSA PCR screening positive. 8. Acute blood loss anemia.  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  Presented on February 26, 2017, after being found down by her husband in the bathroom, unresponsive. CPR initiated.  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  Prior to admission, noted profuse watery diarrhea x3 days; was not able to stay hydrated; multiple doses of Lomotil.  Husband had reported she had not been taking her blood pressure medications.  Potassium 2.1, creatinine 1.26.  CT of the chest showed bilateral lung opacities concerning for edema versus pneumonia.  Cranial CT scan negative.  She did require intubation.  Echocardiogram with ejection fraction of less than 20%, diffuse hypokinesis.  She was transferred to [facility] on March 15, 2017, with cardiac MRI completed that was negative. Underwent ICD placement on March 17, 2017.  She was extubated. Hospital course, followup Gastroenterology for diarrhea, conservative care, consider colonoscopy and EGD as an outpatient.  Hypokalemia, stabilized.   Physical and occupational therapy ongoing.  The patient was admitted for a comprehensive rehab program.  PAST MEDICAL HISTORY:  See discharge diagnoses.  SOCIAL HISTORY:  Lives with spouse, independent prior to admission.  FUNCTIONAL STATUS UPON ADMISSION TO REHAB SERVICES:  Min-mod assist, 15 feet, one-person handheld assistance.  Minimal assist, sit to stand. Min-mod assist, activities daily living.  PHYSICAL EXAMINATION:  VITAL SIGNS:  Blood pressure 126/80, pulse 84, temperature 98, and respirations 20. GENERAL:  Alert female, in no acute distress, oriented x3. HEENT:  EOMs intact. NECK:  Supple, nontender.  No JVD. CARDIAC:  Rate controlled. ABDOMEN:  Soft, nontender.  Good bowel sounds. LUNGS:  Clear to auscultation without wheeze. NEUROLOGIC:  Motor strength 5/5.  REHABILITATION HOSPITAL COURSE:  The patient was admitted to Inpatient Rehab Services.  Therapies initiated on a 3-hour daily basis consisting of physical therapy, occupational therapy, and rehabilitation nursing. The following issues were addressed during the patient's rehabilitation stay.  Pertaining to Ms. Islam, cardiac arrest, VFib, placement of ICD, no chest pain or shortness of breath, she would follow up Cardiology Services.  She showed continued progressive gains while on [employer].  She continued to be maintained on Seroquel at bedtime. Sleeping habits much improved.  Hypokalemia resolved, 3.5; she remained on potassium supplement.  Blood pressures controlled on Coreg.  Her diarrhea had resolved; followup outpatient Gastroenterology Services as needed.  The patient received weekly collaborative interdisciplinary team conferences to discuss estimated length of stay, family teaching, any barriers to discharge.  She was ambulating greater than 250 feet, supervision; working with energy conservation techniques; ambulates to the bathroom; completes all toileting with supervision; working with dynamic  standing balance.  She could gather her belongings for activities of daily living and homemaking.  It was discussed no driving.  DISCHARGE MEDICATIONS:  Included, 1. Aspirin 81 mg p.o. daily. 2. Coreg 3.125 mg p.o. b.i.d. 3. Creon 72,000 units p.o. t.i.d. 4. Potassium chloride 20 mEq p.o. daily. 5. Seroquel 25 mg p.o. at bedtime.  DIET:  Regular.  FOLLOWUP:  She would follow up with Dr. Maryla Morrow at the Outpatient Rehab Service office as needed; Dr. [name], call for appointment; Dr. Sherryl Manges, Cardiology Services; and Dr. Wyline Mood, call for appointment.     Mariam Dollar, P.A.   ______________________________ Maryla Morrow, MD    DA/MEDQ  D:  03/27/2017  T:  03/27/2017  Job:  [id number]  cc:   Wyline Mood, M.D. Duke Salvia, MD, [facility] Oley Balm. Sung Amabile, MD

## 2017-03-27 NOTE — Progress Notes (Signed)
Occupational Therapy Discharge Summary  Patient Details  Name: Miranda [name] MRN: [id number] Date of Birth: 29-Dec-1960   Patient has met 10 of 10 long term goals due to improved activity tolerance, improved balance and ability to compensate for deficits.  Patient to discharge at overall Modified Independent level.  Patient's care partner is independent to provide the necessary physical assistance at discharge.    Reasons goals not met: n/a  Recommendation:  No further OT services indicated. Pt will benefit from outpt cardiac rehab. Equipment: No equipment provided - Pt's spouse will obtain a shower seat.  Reasons for discharge: treatment goals met  Patient/family agrees with progress made and goals achieved: Yes  OT Discharge Precautions/Restrictions  Precautions Precaution Comments: ICD placement on 9/10, no heavy lifting or strenuous activity w/ UEs for 6-8 weeks Restrictions LUE Weight Bearing: Non weight bearing Other Position/Activity Restrictions: s/p cath procedure precautions  ADL ADL ADL Comments: independent overall, sits on shower seat in tub Vision Patient Visual Report: No change from baseline Vision Assessment?: No apparent visual deficits Perception  Perception: Within Functional Limits Praxis Praxis: Intact Cognition Overall Cognitive Status: Within Functional Limits for tasks assessed Sensation Sensation Light Touch: Appears Intact Stereognosis: Appears Intact Hot/Cold: Appears Intact Proprioception: Appears Intact Coordination Gross Motor Movements are Fluid and Coordinated: Yes Fine Motor Movements are Fluid and Coordinated: Yes Motor   WFL Mobility    independent with ADL transfers Trunk/Postural Assessment  Cervical Assessment Cervical Assessment: Within Functional Limits Thoracic Assessment Thoracic Assessment: Within Functional Limits Lumbar Assessment Lumbar Assessment: Within Functional Limits Postural Control Postural Control:  Within Functional Limits  Balance Dynamic Sitting Balance Dynamic Sitting - Level of Assistance: 7: Independent Dynamic Standing Balance Dynamic Standing - Level of Assistance: 7: Independent Extremity/Trunk Assessment RUE Assessment RUE Assessment: Within Functional Limits LUE Assessment LUE Assessment: Within Functional Limits (due to restrictions, pt is not allowed to lift heavy objects with L arm or bear wt through arm)   See Function Navigator for Current Functional Status.  [place] 03/27/2017, 10:38 AM

## 2017-03-27 NOTE — Progress Notes (Signed)
Social Work Patient ID: Miranda [name], female   DOB: 03-16-1961, 56 y.o.   MRN: [id number]   Have reviewed team conference with pt who is very pleased with plan for d/c tomorrow and recommendation of OPPT.  Request follow up be arranged at [facility]  No DME needs.  Continue to follow.  Gessica Jawad, LCSW

## 2017-03-27 NOTE — Progress Notes (Signed)
Occupational Therapy Session Note  Patient Details  Name: Tamyia Minich MRN: [id number] Date of Birth: [birth year]  Today's Date: 03/27/2017 OT Individual Time: 0900-1000 OT Individual Time Calculation (min): 60 min    Short Term Goals: Week 1:  OT Short Term Goal 1 (Week 1): STG=LTG at MOD I level      Skilled Therapeutic Interventions/Progress Updates:    Pt seen for ADL training with a focus on activity tolerance, LE strength, and balance.  Pt ambulated around the room to gather her supplies, used toilet, showered, and dressed and groomed all independently.  She did use a shower chair to sit on to conserve her energy.  Pt accomplished all tasks in a timely manner.  She then ambulated to kitchen to practice reaching into high and low cabinets and refrigerator drawers using R hand. Discussed not picking up heavy items with B hands to maintain precautions.  Her spouse does the cooking, but she demonstrated skills to be able to get a snack or reheat items independently.  Her spouse also does the laundry and she will not be able to carry the laundry basket at this time. She does do the ironing and practiced weight shifting and reaching to complete ironing safely.   Pt ambulated to gym and worked on dynamic standing balance with reaching for objects at floor level with lunging to the side with trunk rotation 10x to R, rested, 10x to L and rested. Discussed EC strategies and how to implement them at home.  Worked again in standing with lunging forward to reach objects at floor level 10x R, 10x L.  Pt is demonstrating excellent progress with her balance.  Pt ambulated back to her room with all needs met.  Therapy Documentation Precautions:  Precautions Precautions: Fall Precaution Comments: ICD placement on 9/10, no heavy lifting or strenuous activity w/ UEs for 6-8 weeks Restrictions Weight Bearing Restrictions: Yes LUE Weight Bearing: Non weight bearing Other Position/Activity Restrictions: s/p  cath procedure precautions    Vital Signs: Therapy Vitals Temp: 98.2 F (36.8 C) Temp Source: Oral Pulse Rate: 80 Resp: 18 BP: 121/75 Patient Position (if appropriate): Sitting Oxygen Therapy SpO2: 99 % O2 Device: Not Delivered Pain: Pain Assessment Pain Assessment: No/denies pain ADL:   See Function Navigator for Current Functional Status.   Therapy/Group: Individual Therapy  SAGUIER,JULIA 03/27/2017, 9:11 AM

## 2017-03-28 LAB — BASIC METABOLIC PANEL
Anion gap: 8 (ref 5–15)
BUN: 18 mg/dL (ref 6–20)
CALCIUM: 8.6 mg/dL — AB (ref 8.9–10.3)
CHLORIDE: 111 mmol/L (ref 101–111)
CO2: 21 mmol/L — ABNORMAL LOW (ref 22–32)
CREATININE: 0.52 mg/dL (ref 0.44–1.00)
GFR calc non Af Amer: >60 mL/min (ref >60)
Glucose, Bld: 104 mg/dL — ABNORMAL HIGH (ref 65–99)
Potassium: 3.6 mmol/L (ref 3.5–5.1)
SODIUM: 140 mmol/L (ref 135–145)

## 2017-03-28 MED ORDER — QUETIAPINE FUMARATE 25 MG PO TABS: 25.0000 mg | ORAL_TABLET | Freq: Every day | ORAL | 0 refills | Status: DC

## 2017-03-28 MED ORDER — POTASSIUM CHLORIDE CRYS ER 20 MEQ PO TBCR: 20.0000 meq | EXTENDED_RELEASE_TABLET | Freq: Every day | ORAL | 1 refills | Status: DC

## 2017-03-28 MED ORDER — PANCRELIPASE (LIP-PROT-AMYL) 36000-114000 UNITS PO CPEP: 72000.0000 [IU] | ORAL_CAPSULE | Freq: Three times a day (TID) | ORAL | 0 refills | Status: DC

## 2017-03-28 MED ORDER — CARVEDILOL 3.125 MG PO TABS: 3.1250 mg | ORAL_TABLET | Freq: Two times a day (BID) | ORAL | 0 refills | Status: DC

## 2017-03-28 NOTE — Progress Notes (Signed)
Social Work  Discharge Note  The overall goal for the admission was met for:   Discharge location: Yes - home with spouse  Length of Stay: Yes - 8 days  Discharge activity level: Yes - modified independent  Home/community participation: Yes  Services provided included: MD, RD, PT, OT, SLP, RN, TR, Pharmacy, Neuropsych and SW  Financial Services: Private Insurance: [place]  Follow-up services arranged: Outpatient: PT via [facility] and Patient/Family request agency HH: OP at [facility], DME: NA  Comments (or additional information):  Patient/Family verbalized understanding of follow-up arrangements: Yes  Individual responsible for coordination of the follow-up plan: pt  Confirmed correct DME delivered: NA  Aishwarya Shiplett

## 2017-03-28 NOTE — Progress Notes (Signed)
Pt discharged to home with family at 17. Discharge instruction given to pt by Harvel Ricks. PA

## 2017-04-03 ENCOUNTER — Encounter: Payer: Self-pay | Admitting: Physical Therapy

## 2017-04-03 ENCOUNTER — Ambulatory Visit: Payer: BC Managed Care – PPO | Attending: Physical Medicine & Rehabilitation | Admitting: Physical Therapy

## 2017-04-03 DIAGNOSIS — M6281 Muscle weakness (generalized): Secondary | ICD-10-CM | POA: Insufficient documentation

## 2017-04-03 NOTE — Patient Instructions (Signed)
ABDUCTION: Sitting - Exercise Ball: Resistance Band (Active)   Sit with feet flat. With band tied around both legs, Lift right leg slightly and, against resistance band, draw it out to side. Complete __2_ sets of __10_ repetitions. Perform _2__ sessions per day.  Copyright  VHI. All rights reserved.  FLEXION: Sitting - Resistance Band (Active)   Sit, both feet flat. Have band tied around both legs above knees, lift right knee toward ceiling.Repeat with other knee Complete _2__ sets of _10__ repetitions. Perform _2__ sessions per day.  http://gtsc.exer.us/21   Copyright  VHI. All rights reserved.  HIP / KNEE: Extension - Sit to Stand   Sitting, lean chest forward, raise hips up from surface. Straighten hips and knees. Weight bear equally on left and right sides. Backs of legs should not push off surface. __10_ reps per set, __2_ sets per day, _5__ days per week Use assistive device as needed.  Copyright  VHI. All rights reserved.  FLEXION: Sitting - Resistance Band (Active)   Sit with right foot flat. Have band tied around both feet, bend ankle, bringing toes toward head. Complete __2_ sets of __10_ repetitions. Perform _2__ sessions per day.  Balance, Proprioception: Hip Abduction With Tubing   With tubing attached to both ankles, Standing holding onto counter, kick one leg out to side and then Return.  Repeat _10___ times  On each side.  Do ___2_ sessions per day.  http://cc.exer.us/20    Balance, Proprioception: Hip Extension With Tubing   With tubing tied around both legs, holding onto kitchen counter, swing leg back. Return. Repeat _10___ times . Do __2__ sessions per day.  http://cc.exer.us/19    Copyright  VHI. All rights reserved.  Band Walk: Side Stepping   Tie band around legs,AROUND ANKLES. Step _10__ feet to one side, then step back to start. Repeat _2-3__ feet per session. Note: Small towel between band and skin eases  rubbing.  http://plyo.exer.us/76

## 2017-04-03 NOTE — Therapy (Signed)
[place] MAIN [facility] SERVICES [address] [place], [place], [address] Phone: [phone]   Fax:  [phone]  Physical Therapy Evaluation  Patient Details  Name: Miranda [name] MRN: [id number] Date of Birth: 06-23-1961 Referring Provider: Maryla Morrow MD; Mariah Milling (MD)  Encounter Date: 04/03/2017      PT End of Session - 04/03/17 1122    Visit Number 1   Number of Visits 13   Date for PT Re-Evaluation 05/15/17   Authorization Type BCBS   PT Start Time 1015   PT Stop Time 1105   PT Time Calculation (min) 50 min   Activity Tolerance Patient tolerated treatment well;No increased pain   Behavior During Therapy WFL for tasks assessed/performed      Past Medical History:  Diagnosis Date  . Chronic systolic CHF (congestive heart failure) (HCC)    a. TTE 02/26/2017: EF < 20%, diffuse HK, mild AI, RV sys fxn mildly reduced; b. TTE 03/03/2017: EF 55-60%, nl WM, mild AI, PASP 46  . Coronary artery disease, non-occlusive    a. LHC 02/25/2017: no angiographically significant CAD, EF <35%, moderately elevated LVEDP at 28 mmHg  . Dependent edema    bilateral legs  . Hot flashes   . HTN (hypertension)    controlled with meds;   . Migraines   . Nausea   . Ventricular fibrillation (HCC)    a. 02/25/2017: LHC without signficant CAD; b. idiopathic; c. possibly 2/2 diarrhea with electrolyte abnormalities with Imodium usage    Past Surgical History:  Procedure Laterality Date  . APPENDECTOMY  1974  . CHOLECYSTECTOMY  1984  . ICD IMPLANT N/A 03/17/2017   Procedure: ICD Implant;  Surgeon: Duke Salvia, MD;  Location: [facility] INVASIVE CV LAB;  Service: Cardiovascular;  Laterality: N/A;  . LEFT HEART CATH AND CORONARY ANGIOGRAPHY N/A 02/26/2017   Procedure: LEFT HEART CATH AND CORONARY ANGIOGRAPHY;  Surgeon: Yvonne Kendall, MD;  Location: [facility] INVASIVE CV LAB;  Service: Cardiovascular;  Laterality: N/A;  . TONSILLECTOMY  1964    There were no vitals filed  for this visit.       Subjective Assessment - 04/03/17 1020    Subjective "Its hard not  being able to do what I could before. I get really tired."    Pertinent History 56 yo Female reports v-fib arrest on 02/25/17; she reports that she was down 40 min and was brought to ED by EMS; went to CCU on ice; Patient reports she was intubated for a week; She failed extubation a week later; she was then called code again; she reports that she was given a  broncoscopy and was found to have pseudomonas in both lungs;  She rpeorts 4 days later was extubated again; She was given a defibrillator September 10th; she was discharged to inpatient rehab and then has been home since 9/21; She reports that since being home she has been doing some walking but has been taking it easy; She is mostly mod I for self care ADLs but does use a shower chair; Denies any recent falls; Patient does have an 8 pound lifting restriction and to avoid excessive overhead lifting; Patient reports that she was working at [employer], but is not working right now; She is scheduled to be out until November 14th, dependening on recovery; Patient does follow up with cardiologist on 04/09/17- as of now she is not scheduled for cardiac rehab;    How long can you sit comfortably? NA   How  long can you stand comfortably? >1 hour   How long can you walk comfortably? >1 hour without difficulty; does take it slow; uses no assistive device;    Patient Stated Goals "I want to get back to my PLOF. get stronger"   Currently in Pain? No/denies   Multiple Pain Sites No            [facility] PT Assessment - 04/03/17 0001      Assessment   Medical Diagnosis weakness   Referring Provider Maryla Morrow MD; Mariah Milling (MD)   Onset Date/Surgical Date 02/25/17   Hand Dominance Right   Next MD Visit October 2018   Prior Therapy denies any PT for this condition; did have inpatient rehab following hospital discharge with good results;      Precautions   Precautions  ICD/Pacemaker   Precaution Comments no lifting >8 pounds; no raising arms excessively overhead;      Restrictions   Weight Bearing Restrictions No  8 pounds lifting restriction;      Balance Screen   Has the patient fallen in the past 6 months No   Has the patient had a decrease in activity level because of a fear of falling?  No   Is the patient reluctant to leave their home because of a fear of falling?  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);    Vocation Requirements not a lot of lifting; able to sit as needed;   not working currently; hoping to return November 14   Leisure cross stitch; read; spend time with granddaughter in [place];      Cognition   Overall Cognitive Status Within Functional Limits for tasks assessed     Observation/Other Assessments   Observations patient does exhibit a raspy voice from intubation; sits with slightly slumped posture, able to self correct with cues;      Sensation   Light Touch Appears Intact     Coordination   Gross Motor Movements are Fluid and Coordinated Yes   Fine Motor Movements are Fluid and Coordinated Yes     Posture/Postural Control   Posture Comments able to demonstrate erect posture without deficit with cues;      AROM   Overall AROM Comments AROM is WFL;      Strength   Overall Strength Comments BUE grossly 4/5;    Right Hip Flexion 3+/5   Right Hip ABduction 4/5   Right Hip ADduction 4/5   Left Hip Flexion 3+/5   Left Hip ABduction 4/5   Left Hip ADduction 4/5   Right Knee Flexion 4/5   Right Knee Extension 4+/5   Left Knee Flexion 4/5   Left Knee Extension 4+/5   Right Ankle Dorsiflexion 4/5   Right Ankle Plantar Flexion 4/5   Left Ankle  Dorsiflexion 4/5   Left Ankle Plantar Flexion 4/5     Transfers   Comments able to transfer sit<>Stand without pushing on chair with good safety and stability;      Ambulation/Gait   Gait Comments ambulates independently with good foot clearance, reciprocal gait; slightly slower gait speed with prolonged walking;      6 Minute Walk- Baseline  BP (mmHg) 139/83   HR (bpm) 70   02 Sat (%RA) 99 %     6 Minute walk- Post Test   BP (mmHg) (!)  142/100   HR (bpm) 78   02 Sat (%RA) 99 %     6 minute walk test results    Aerobic Endurance Distance Walked 1280   Endurance additional comments without AD (community ambulator, but less than age group norms.)     Standardized Balance Assessment   Five times sit to stand comments  9 sec without HHA (<10 sec indicates low fall risk) did have some unsteadiness;    10 Meter Walk 1.4 m/s without AD (community ambulator, low fall risk)     High Level Balance   High Level Balance Comments static standing balance is good; dynamic standing balance is good; SLS: R: 5 sec, L: 0 sec;             Objective measurements completed on examination: See above findings.    TREATMENT: Instructed patient in HEP including: Sitting with green tband hip flexion x5 reps, hip abduction/ER x5 reps; ankle DF x5 reps bilaterally. Patient required min-moderate verbal/tactile cues for correct exercise technique including cues to improve positioning and increase ROM for better strengthening; Standing with green tband around LE: hip abduction x2 reps bilaterally, hip extension x2 reps bilaterally, side stepping x5 feet Patient required min-moderate verbal/tactile cues for correct exercise technique including cues to use countertop for balance control;               PT Education - 04/03/17 1122    Education provided Yes   Education Details HEP initiated, recommendations/plan of care;    Person(s) Educated Patient   Methods  Explanation;Demonstration;Verbal cues;Handout   Comprehension Verbalized understanding;Returned demonstration;Verbal cues required;Need further instruction          PT Short Term Goals - 04/03/17 1134      PT SHORT TERM GOAL #1   Title Patient will tolerate 5 seconds of single leg stance without loss of balance to improve ability to get in and out of shower safely.   Baseline R: 5 sec, L: 0 sec   Time 4   Period Weeks   Status New   Target Date 05/01/17     PT SHORT TERM GOAL #2   Title Patient will increase BLE gross strength to 4+/5 as to improve functional strength for independent gait, increased standing tolerance and increased ADL ability.   Time 4   Period Weeks   Status New   Target Date 05/01/17           PT Long Term Goals - 04/03/17 1135      PT LONG TERM GOAL #1   Title Patient will be independent in home exercise program to improve strength/mobility for better functional independence with ADLs.   Time 6   Period Weeks   Status New   Target Date 05/15/17     PT LONG TERM GOAL #2   Title Patient will increase six minute walk test distance to >1400 feet for progression to improve gait closer to age group norms of 1800 feet with safe cardiovascular response.    Baseline 1280   Time 6   Period Weeks   Status New   Target Date 05/15/17     PT LONG TERM GOAL #3   Title Patient will be able to negotiate 4+ steps with rail assist as needed, without fatigue, modified independently to improve ability to get  in/out of work/family homes.    Time 6   Period Weeks   Status New   Target Date 05/15/17                Plan - 04/03/17 1123    Clinical Impression Statement 56 yo Female s/p recent v-fib arrest with subsequent cardiac issue. Patient is s/p ICD placement on 03/17/17 and has an 8 pound lifting restriction in LUE. Patient exhibits some weakness in BUE and BLE, greatest in hips. Patient also exhibits decreased conditioning with increased BP after  walking 6 min. Patient demonstrates good static standing balance but does have some instability with SLS on left side. She is following up with her cardiologist in the next month and may pursue cardiac rehab. She would benefit from skilled PT intervention to improve strength, balance and mobility to return to PLOF.    History and Personal Factors relevant to plan of care: recent cardiac arrest/has ICD now; lives with husband/son who provide good support; single story home; HTN (well controlled);    Clinical Presentation Stable   Clinical Presentation due to: improvement in symptoms, ambulating independently with good safety awareness;    Clinical Decision Making Low   Rehab Potential Good   Clinical Impairments Affecting Rehab Potential recent cardiac history; monitor BP   PT Frequency 2x / week   PT Duration 6 weeks   PT Treatment/Interventions ADLs/Self Care Home Management;Cryotherapy;Moist Heat;Therapeutic activities;Therapeutic exercise;Balance training;Neuromuscular re-education;Patient/family education;Energy conservation   PT Next Visit Plan advance HEP- work on strength/balance   PT Home Exercise Plan initiated- see patient instructions;    Consulted and Agree with Plan of Care Patient      Patient will benefit from skilled therapeutic intervention in order to improve the following deficits and impairments:  Cardiopulmonary status limiting activity, Decreased activity tolerance, Decreased balance, Decreased endurance, Difficulty walking, Decreased strength  Visit Diagnosis: Muscle weakness (generalized) - Plan: PT plan of care cert/re-cert     Problem List Patient Active Problem List   Diagnosis Date Noted  . Acute blood loss anemia   . Hypoalbuminemia due to protein-calorie malnutrition (HCC)   . Benign essential HTN   . Debility   . AKI (acute kidney injury) (HCC) 03/14/2017  . Diarrhea 03/14/2017  . Bradycardia   . Cardiac arrest with ventricular fibrillation (HCC)  02/26/2017  . Cardiac arrest (HCC) 02/26/2017  . Acute respiratory failure (HCC)   . Hypokalemia   . Encounter for central line placement   . Cardiogenic shock (HCC)   . Acute pulmonary edema (HCC)   . Anoxic encephalopathy (HCC)   . Hypertension 08/16/2016  . Edema 08/16/2016  . Nausea 08/16/2016  . Class 2 obesity due to excess calories without serious comorbidity with body mass index (BMI) of 35.0 to 35.9 in adult 08/16/2016    Trotter,Margaret PT, DPT 04/03/2017, 11:45 AM  [facility] MAIN [facility] SERVICES [address] [place], [place], [address] Phone: [phone]   Fax:  [phone]  Name: Miranda [name] MRN: [id number] Date of Birth: 1960/12/05

## 2017-04-07 ENCOUNTER — Encounter: Payer: Self-pay | Admitting: Physical Therapy

## 2017-04-07 ENCOUNTER — Ambulatory Visit: Payer: BC Managed Care – PPO | Attending: Physical Medicine & Rehabilitation | Admitting: Physical Therapy

## 2017-04-07 DIAGNOSIS — M6281 Muscle weakness (generalized): Secondary | ICD-10-CM | POA: Diagnosis not present

## 2017-04-07 NOTE — Therapy (Signed)
[facility] MAIN [facility] SERVICES [address] [place], [place], [address] Phone: [phone]   Fax:  [phone]  Physical Therapy Treatment  Patient Details  Name: Miranda [name] MRN: [id number] Date of Birth: 02-Mar-1961 Referring Provider: Maryla Morrow MD; Mariah Milling (MD)  Encounter Date: 04/07/2017      PT End of Session - 04/07/17 0854    Visit Number 2   Number of Visits 13   Date for PT Re-Evaluation 05/15/17   Authorization Type BCBS   PT Start Time 0845   PT Stop Time 0930   PT Time Calculation (min) 45 min   Activity Tolerance Patient tolerated treatment well;No increased pain   Behavior During Therapy WFL for tasks assessed/performed      Past Medical History:  Diagnosis Date  . Chronic systolic CHF (congestive heart failure) (HCC)    a. TTE 02/26/2017: EF < 20%, diffuse HK, mild AI, RV sys fxn mildly reduced; b. TTE 03/03/2017: EF 55-60%, nl WM, mild AI, PASP 46  . Coronary artery disease, non-occlusive    a. LHC 02/25/2017: no angiographically significant CAD, EF <35%, moderately elevated LVEDP at 28 mmHg  . Dependent edema    bilateral legs  . Hot flashes   . HTN (hypertension)    controlled with meds;   . Migraines   . Nausea   . Ventricular fibrillation (HCC)    a. 02/25/2017: LHC without signficant CAD; b. idiopathic; c. possibly 2/2 diarrhea with electrolyte abnormalities with Imodium usage    Past Surgical History:  Procedure Laterality Date  . APPENDECTOMY  1974  . CHOLECYSTECTOMY  1984  . ICD IMPLANT N/A 03/17/2017   Procedure: ICD Implant;  Surgeon: Duke Salvia, MD;  Location: [facility] INVASIVE CV LAB;  Service: Cardiovascular;  Laterality: N/A;  . LEFT HEART CATH AND CORONARY ANGIOGRAPHY N/A 02/26/2017   Procedure: LEFT HEART CATH AND CORONARY ANGIOGRAPHY;  Surgeon: Yvonne Kendall, MD;  Location: [facility] INVASIVE CV LAB;  Service: Cardiovascular;  Laterality: N/A;  . TONSILLECTOMY  1964    There were no vitals filed  for this visit.      Subjective Assessment - 04/07/17 0851    Subjective Patient reports having a good weekend. She reports compliance with HEP; She reports compliance with HEP. She reports. "I dont' know what I did, but my hamstrings are a little sore from the band exercise. It feels like a bruise. " denies any pain when asked to rate pain from 0-10;    Pertinent History 56 yo Female reports v-fib arrest on 02/25/17; she reports that she was down 40 min and was brought to ED by EMS; went to CCU on ice; Patient reports she was intubated for a week; She failed extubation a week later; she was then called code again; she reports that she was given a  broncoscopy and was found to have pseudomonas in both lungs;  She rpeorts 4 days later was extubated again; She was given a defibrillator September 10th; she was discharged to inpatient rehab and then has been home since 9/21; She reports that since being home she has been doing some walking but has been taking it easy; She is mostly mod I for self care ADLs but does use a shower chair; Denies any recent falls; Patient does have an 8 pound lifting restriction and to avoid excessive overhead lifting; Patient reports that she was working at [employer], but is not working right now; She is scheduled to be out until November 14th,  dependening on recovery; Patient does follow up with cardiologist on 04/09/17- as of now she is not scheduled for cardiac rehab;    How long can you sit comfortably? NA   How long can you stand comfortably? >1 hour   How long can you walk comfortably? >1 hour without difficulty; does take it slow; uses no assistive device;    Patient Stated Goals "I want to get back to my PLOF. get stronger"   Currently in Pain? No/denies        TREATMENT: Warm up on Crosstrainer level 2 x5 min (Unbilled); Patient monitored HR throughout exercise with HR in the low 90's during exercise;   Leg press, BLE plate 16# [id number] with min VCs to slow down LE  movement for better strengthening;  Leg press, BLE plate 04# heel raises 2x15 with cues to keep knee straight for better ankle strengthening;   resisted walking 17.5# forward/backward, side/side (4 way) x2 laps each direction with min A for safety and cues to slow down eccentric return for better safety;   Sit<>stand from regular seat with ball in UE x10 reps with cues to reach forward to facilitate forward weight shift prior to transfer for better sit to stand ability;   Hooklying: Bridges with arms across chest to facilitate posterior hip strengthening x15 reps;   Sidelying Clamshells green tband 2x15 bilaterally with cues to improve positioning and avoid trunk rotation for better hip strengthening;   Patient prone: Alternate knee flexion x10 reps bilaterally to improve tissue extensibility and reduce tightness; Gluteal max hip extension (knee bent) 2x10 reps with min A for increased ROM. Patient exhibits increased weakness and requires min Vcs for correct exercise technique;  Alternate hip extension SLR 2x10 bilaterally with min VCs to avoid trunk rotation for better hip strengthening;   Standing heel off step calf stretch 20 sec hold x2 bilaterally;  Standing hamstring stretch with knee straight and foot on step 20 sec hold x2 bilaterally;   Patient denies any pain at end of session; She does report mild fatigue due to weakness; Reinforced HEP for patient to continue at this time;  BP at end of session: 116/80, HR 79;                  PT Education - 04/07/17 0853    Education provided Yes   Education Details HEP reinforced, strengthening/ROM exercise;    Person(s) Educated Patient   Methods Explanation;Demonstration;Verbal cues   Comprehension Verbalized understanding;Returned demonstration;Verbal cues required;Need further instruction          PT Short Term Goals - 04/03/17 1134      PT SHORT TERM GOAL #1   Title Patient will tolerate 5 seconds of single leg  stance without loss of balance to improve ability to get in and out of shower safely.   Baseline R: 5 sec, L: 0 sec   Time 4   Period Weeks   Status New   Target Date 05/01/17     PT SHORT TERM GOAL #2   Title Patient will increase BLE gross strength to 4+/5 as to improve functional strength for independent gait, increased standing tolerance and increased ADL ability.   Time 4   Period Weeks   Status New   Target Date 05/01/17           PT Long Term Goals - 04/03/17 1135      PT LONG TERM GOAL #1   Title Patient will be independent in home exercise program  to improve strength/mobility for better functional independence with ADLs.   Time 6   Period Weeks   Status New   Target Date 05/15/17     PT LONG TERM GOAL #2   Title Patient will increase six minute walk test distance to >1400 feet for progression to improve gait closer to age group norms of 1800 feet with safe cardiovascular response.    Baseline 1280   Time 6   Period Weeks   Status New   Target Date 05/15/17     PT LONG TERM GOAL #3   Title Patient will be able to negotiate 4+ steps with rail assist as needed, without fatigue, modified independently to improve ability to get in/out of work/family homes.    Time 6   Period Weeks   Status New   Target Date 05/15/17               Plan - 04/07/17 0913    Clinical Impression Statement Patient instructed in advanced strengthening exercise of BLE. She requires cues to improve positioning and slow down LE movement for better strengthening. Patient denies any pain with advanced exercise. She did require a short sitting rest break due to shortness of breath, however HR 93 bpm. Patient would benefit from additional skilled PT intervention to improve strength, balance and gait safety;    Rehab Potential Good   Clinical Impairments Affecting Rehab Potential recent cardiac history; monitor BP   PT Frequency 2x / week   PT Duration 6 weeks   PT  Treatment/Interventions ADLs/Self Care Home Management;Cryotherapy;Moist Heat;Therapeutic activities;Therapeutic exercise;Balance training;Neuromuscular re-education;Patient/family education;Energy conservation   PT Next Visit Plan advance HEP- work on strength/balance   PT Home Exercise Plan Continue as given;    Consulted and Agree with Plan of Care Patient      Patient will benefit from skilled therapeutic intervention in order to improve the following deficits and impairments:  Cardiopulmonary status limiting activity, Decreased activity tolerance, Decreased balance, Decreased endurance, Difficulty walking, Decreased strength  Visit Diagnosis: Muscle weakness (generalized)     Problem List Patient Active Problem List   Diagnosis Date Noted  . Acute blood loss anemia   . Hypoalbuminemia due to protein-calorie malnutrition (HCC)   . Benign essential HTN   . Debility   . AKI (acute kidney injury) (HCC) 03/14/2017  . Diarrhea 03/14/2017  . Bradycardia   . Cardiac arrest with ventricular fibrillation (HCC) 02/26/2017  . Cardiac arrest (HCC) 02/26/2017  . Acute respiratory failure (HCC)   . Hypokalemia   . Encounter for central line placement   . Cardiogenic shock (HCC)   . Acute pulmonary edema (HCC)   . Anoxic encephalopathy (HCC)   . Hypertension 08/16/2016  . Edema 08/16/2016  . Nausea 08/16/2016  . Class 2 obesity due to excess calories without serious comorbidity with body mass index (BMI) of 35.0 to 35.9 in adult 08/16/2016    Rodricus Candelaria PT, DPT 04/07/2017, 9:20 AM  [facility] MAIN [facility] SERVICES [address] [place], [place], [address] Phone: [phone]   Fax:  [phone]  Name: Miranda [name] MRN: [id number] Date of Birth: [birth year]

## 2017-04-09 ENCOUNTER — Ambulatory Visit (INDEPENDENT_AMBULATORY_CARE_PROVIDER_SITE_OTHER): Payer: BC Managed Care – PPO | Admitting: *Deleted

## 2017-04-09 DIAGNOSIS — Z23 Encounter for immunization: Secondary | ICD-10-CM

## 2017-04-09 DIAGNOSIS — I469 Cardiac arrest, cause unspecified: Secondary | ICD-10-CM

## 2017-04-09 DIAGNOSIS — I4901 Ventricular fibrillation: Secondary | ICD-10-CM | POA: Diagnosis not present

## 2017-04-09 LAB — CUP PACEART INCLINIC DEVICE CHECK
Battery Remaining Longevity: 138 mo
Battery Voltage: 3.18 V
Date Time Interrogation Session: 20181003164213
HighPow Impedance: 71 Ohm
Implantable Pulse Generator Implant Date: 20180910
Lead Channel Impedance Value: 513 Ohm
Lead Channel Impedance Value: 570 Ohm
Lead Channel Pacing Threshold Amplitude: 0.625 V
Lead Channel Pacing Threshold Pulse Width: 0.4 ms
Lead Channel Sensing Intrinsic Amplitude: 10.875 mV
Lead Channel Setting Pacing Amplitude: 3.5 V
MDC IDC LEAD IMPLANT DT: 20180910
MDC IDC LEAD LOCATION: 753860
MDC IDC SET LEADCHNL RV PACING PULSEWIDTH: 0.4 ms
MDC IDC SET LEADCHNL RV SENSING SENSITIVITY: 0.3 mV
MDC IDC STAT BRADY RV PERCENT PACED: 0.01 %

## 2017-04-09 NOTE — Progress Notes (Signed)
Wound check appointment. Steri-strips removed. Wound without redness or edema. Incision edges approximated, wound well healed. Stitch clipped right lateral edge, antibiotic ointment and bandaid applied. Normal device function. Thresholds, sensing, and impedances consistent with implant measurements. Device programmed at 3.5V for extra safety margin until 3 month visit. Histogram distribution appropriate for patient and level of activity. 1 NSVT, duration 1 second, Avg V rate 200 bpm. Patient educated about wound care, arm mobility, lifting restrictions, shock plan. ROV  with SK 06/19/17.

## 2017-04-10 ENCOUNTER — Ambulatory Visit: Payer: BC Managed Care – PPO | Admitting: Physical Therapy

## 2017-04-10 ENCOUNTER — Telehealth: Payer: Self-pay | Admitting: Cardiovascular Disease

## 2017-04-10 ENCOUNTER — Encounter: Payer: Self-pay | Admitting: Physical Therapy

## 2017-04-10 DIAGNOSIS — I4901 Ventricular fibrillation: Secondary | ICD-10-CM

## 2017-04-10 DIAGNOSIS — M6281 Muscle weakness (generalized): Secondary | ICD-10-CM

## 2017-04-10 DIAGNOSIS — I469 Cardiac arrest, cause unspecified: Secondary | ICD-10-CM

## 2017-04-10 MED ORDER — POTASSIUM CHLORIDE CRYS ER 20 MEQ PO TBCR: 20.0000 meq | EXTENDED_RELEASE_TABLET | Freq: Every day | ORAL | 3 refills | Status: DC

## 2017-04-10 MED ORDER — CARVEDILOL 3.125 MG PO TABS: 3.1250 mg | ORAL_TABLET | Freq: Two times a day (BID) | ORAL | 3 refills | Status: DC

## 2017-04-10 NOTE — Telephone Encounter (Signed)
Yes she needs cardiac rehabilitation thx TGollan

## 2017-04-10 NOTE — Telephone Encounter (Signed)
Cardiac rehab requires order to come from [facility].

## 2017-04-10 NOTE — Telephone Encounter (Signed)
Please advise as to whether its ok to order cardiac rehab for this patient. Next appointment is on 04/29/17 with Ward Givens.  Thanks!

## 2017-04-10 NOTE — Telephone Encounter (Signed)
°*  STAT* If patient is at the pharmacy, call can be transferred to refill team.   1. Which medications need to be refilled? (please list name of each medication and dose if known) COREG 3.125 MG, CREON 36000 Units, K-DUR KLOR-CON 20 MEQ, and SEROQUEL 25 MG  2. Which pharmacy/location (including street and city if local pharmacy) is medication to be sent to? [facility] at [address] - NOT the one in [employer]   3. Do they need a 30 day or 90 day supply? 90 day  CREON will be out tomorrow, others will be out in a week

## 2017-04-10 NOTE — Telephone Encounter (Signed)
Routing to [facility] for advice.

## 2017-04-10 NOTE — Therapy (Signed)
[place] [facility] MAIN [facility] SERVICES [address] [place], [place], [address] Phone: [phone]   Fax:  [phone]  Physical Therapy Treatment  Patient Details  Name: Miranda [name] MRN: [id number] Date of Birth: 04/27/1961 Referring Provider: Maryla Morrow MD; Mariah Milling (MD)  Encounter Date: 04/10/2017      PT End of Session - 04/10/17 0809    Visit Number 3   Number of Visits 13   Date for PT Re-Evaluation 05/15/17   Authorization Type BCBS   PT Start Time 0800   PT Stop Time 0845   PT Time Calculation (min) 45 min   Activity Tolerance Patient tolerated treatment well;No increased pain   Behavior During Therapy WFL for tasks assessed/performed      Past Medical History:  Diagnosis Date  . Chronic systolic CHF (congestive heart failure) (HCC)    a. TTE 02/26/2017: EF < 20%, diffuse HK, mild AI, RV sys fxn mildly reduced; b. TTE 03/03/2017: EF 55-60%, nl WM, mild AI, PASP 46  . Coronary artery disease, non-occlusive    a. LHC 02/25/2017: no angiographically significant CAD, EF <35%, moderately elevated LVEDP at 28 mmHg  . Dependent edema    bilateral legs  . Hot flashes   . HTN (hypertension)    controlled with meds;   . Migraines   . Nausea   . Ventricular fibrillation (HCC)    a. 02/25/2017: LHC without signficant CAD; b. idiopathic; c. possibly 2/2 diarrhea with electrolyte abnormalities with Imodium usage    Past Surgical History:  Procedure Laterality Date  . APPENDECTOMY  1974  . CHOLECYSTECTOMY  1984  . ICD IMPLANT N/A 03/17/2017   Procedure: ICD Implant;  Surgeon: Duke Salvia, MD;  Location: [facility] INVASIVE CV LAB;  Service: Cardiovascular;  Laterality: N/A;  . LEFT HEART CATH AND CORONARY ANGIOGRAPHY N/A 02/26/2017   Procedure: LEFT HEART CATH AND CORONARY ANGIOGRAPHY;  Surgeon: Yvonne Kendall, MD;  Location: [facility] INVASIVE CV LAB;  Service: Cardiovascular;  Laterality: N/A;  . TONSILLECTOMY  1964    There were no vitals filed  for this visit.      Subjective Assessment - 04/10/17 0808    Subjective Patient reports feeling well; no pain or soreness. She states, "I feel so much more energized since last session."    Pertinent History 56 yo Female reports v-fib arrest on 02/25/17; she reports that she was down 40 min and was brought to ED by EMS; went to CCU on ice; Patient reports she was intubated for a week; She failed extubation a week later; she was then called code again; she reports that she was given a  broncoscopy and was found to have pseudomonas in both lungs;  She rpeorts 4 days later was extubated again; She was given a defibrillator September 10th; she was discharged to inpatient rehab and then has been home since 9/21; She reports that since being home she has been doing some walking but has been taking it easy; She is mostly mod I for self care ADLs but does use a shower chair; Denies any recent falls; Patient does have an 8 pound lifting restriction and to avoid excessive overhead lifting; Patient reports that she was working at [employer], but is not working right now; She is scheduled to be out until November 14th, dependening on recovery; Patient does follow up with cardiologist on 04/09/17- as of now she is not scheduled for cardiac rehab;    How long can you sit comfortably? NA  How long can you stand comfortably? >1 hour   How long can you walk comfortably? >1 hour without difficulty; does take it slow; uses no assistive device;    Patient Stated Goals "I want to get back to my PLOF. get stronger"   Currently in Pain? No/denies        TREATMENT: Warm up on treadmill, level surface, 1.2 mph x4 min (unbilled);  Leg press, BLE plate 33# [id number] with min VCs to slow down LE movement for better strengthening;  Leg press, BLE plate 53# heel raises 2x15 with cues to keep knee straight for better ankle strengthening;   HOIST hamstring curl: plate #5 (97#) [id number] with cues for positioning to improve  strengthening;  Resisted low row, plate #1 (19#) BUE 2x10 with cues to increase scapular retraction for better upper back strengthening;  resisted walking 12.5# forward/backward, side/side (4 way) x2 laps each direction with min A for safety and cues to slow down eccentric return for better safety;   Sidelying Clamshells green tband 2x15 bilaterally with cues to improve positioning and avoid trunk rotation for better hip strengthening;   Patient prone: Alternate knee flexion x15 reps bilaterally to improve tissue extensibility and reduce tightness; Gluteal max hip extension (knee bent) 2x10 reps with min A for increased ROM. Patient exhibits increased weakness and requires min Vcs for correct exercise technique;  Alternate hip extension SLR 2x10 bilaterally with min VCs to avoid trunk rotation for better hip strengthening;    Forward step ups on 4 inch step with rail assist x10 bilaterally; HR remained in low 90's during exercise, no shortness of breath; Standing heel off step calf stretch 20 sec hold x2 bilaterally;  Standing hamstring stretch with knee straight and foot on step 20 sec hold x2 bilaterally;   Patient denies any pain at end of session; She does report mild fatigue due to weakness;                           PT Education - 04/10/17 0809    Education provided Yes   Education Details HEP reinforced, strengthening   Person(s) Educated Patient   Methods Explanation;Demonstration;Verbal cues   Comprehension Verbalized understanding;Returned demonstration;Verbal cues required;Need further instruction          PT Short Term Goals - 04/03/17 1134      PT SHORT TERM GOAL #1   Title Patient will tolerate 5 seconds of single leg stance without loss of balance to improve ability to get in and out of shower safely.   Baseline R: 5 sec, L: 0 sec   Time 4   Period Weeks   Status New   Target Date 05/01/17     PT SHORT TERM GOAL #2   Title Patient  will increase BLE gross strength to 4+/5 as to improve functional strength for independent gait, increased standing tolerance and increased ADL ability.   Time 4   Period Weeks   Status New   Target Date 05/01/17           PT Long Term Goals - 04/03/17 1135      PT LONG TERM GOAL #1   Title Patient will be independent in home exercise program to improve strength/mobility for better functional independence with ADLs.   Time 6   Period Weeks   Status New   Target Date 05/15/17     PT LONG TERM GOAL #2   Title Patient will increase six minute  walk test distance to >1400 feet for progression to improve gait closer to age group norms of 1800 feet with safe cardiovascular response.    Baseline 1280   Time 6   Period Weeks   Status New   Target Date 05/15/17     PT LONG TERM GOAL #3   Title Patient will be able to negotiate 4+ steps with rail assist as needed, without fatigue, modified independently to improve ability to get in/out of work/family homes.    Time 6   Period Weeks   Status New   Target Date 05/15/17               Plan - 04/10/17 0835    Clinical Impression Statement patient instructed in advanced LE strengthening exercise. She was able to increase repetitions/resistance as compared to last session. Patient does require min VCs for correct positioning/exercise technique for optimum strengthening. She denies any pain or discomfort at end of session; Vitals remain stable during exercise. She would benefit from additional skilled PT intervention to improve LE strength and functional mobility;    Rehab Potential Good   Clinical Impairments Affecting Rehab Potential recent cardiac history; monitor BP   PT Frequency 2x / week   PT Duration 6 weeks   PT Treatment/Interventions ADLs/Self Care Home Management;Cryotherapy;Moist Heat;Therapeutic activities;Therapeutic exercise;Balance training;Neuromuscular re-education;Patient/family education;Energy conservation   PT  Next Visit Plan advance HEP- work on strength/balance   PT Home Exercise Plan continue as given;    Consulted and Agree with Plan of Care Patient      Patient will benefit from skilled therapeutic intervention in order to improve the following deficits and impairments:  Cardiopulmonary status limiting activity, Decreased activity tolerance, Decreased balance, Decreased endurance, Difficulty walking, Decreased strength  Visit Diagnosis: Muscle weakness (generalized)     Problem List Patient Active Problem List   Diagnosis Date Noted  . Acute blood loss anemia   . Hypoalbuminemia due to protein-calorie malnutrition (HCC)   . Benign essential HTN   . Debility   . AKI (acute kidney injury) (HCC) 03/14/2017  . Diarrhea 03/14/2017  . Bradycardia   . Cardiac arrest with ventricular fibrillation (HCC) 02/26/2017  . Cardiac arrest (HCC) 02/26/2017  . Acute respiratory failure (HCC)   . Hypokalemia   . Encounter for central line placement   . Cardiogenic shock (HCC)   . Acute pulmonary edema (HCC)   . Anoxic encephalopathy (HCC)   . Hypertension 08/16/2016  . Edema 08/16/2016  . Nausea 08/16/2016  . Class 2 obesity due to excess calories without serious comorbidity with body mass index (BMI) of 35.0 to 35.9 in adult 08/16/2016    Trotter,Margaret PT DPT 04/10/2017, 8:37 AM  [facility] MAIN [facility] SERVICES [address] [place], [place], [address] Phone: [phone]   Fax:  [phone]  Name: Miranda [name] MRN: [id number] Date of Birth: 1961-02-01

## 2017-04-10 NOTE — Telephone Encounter (Signed)
PT came by and states Physical Therapist Windle Guard would like her to receive a referral for Cardiac Rehab Please advise

## 2017-04-11 NOTE — Telephone Encounter (Signed)
Left detailed voicemail message that referral for cardiac rehab was ordered and they should be in touch to get that scheduled with instructions to call back if any further questions.

## 2017-04-14 ENCOUNTER — Telehealth: Payer: Self-pay | Admitting: Cardiology

## 2017-04-14 ENCOUNTER — Ambulatory Visit: Payer: BC Managed Care – PPO | Admitting: Physical Therapy

## 2017-04-14 ENCOUNTER — Encounter: Payer: Self-pay | Admitting: Cardiology

## 2017-04-14 ENCOUNTER — Encounter: Payer: Self-pay | Admitting: Physical Therapy

## 2017-04-14 DIAGNOSIS — M6281 Muscle weakness (generalized): Secondary | ICD-10-CM

## 2017-04-14 NOTE — Telephone Encounter (Signed)
Patient called and stated that she received a flu shot in the office on 04-09-17. The card that was given to her was expired. She stated that her employer would not accept this documentation. She wanted to make sure that she did not receive an expired vaccination and she wanted to see if she could get another form of documentation that proved that she got the flu shot. After consulting w/ RN supervisor. I informed patient that she did not receive an expired vaccination b/c we only receive those vaccinations once a year and that those cards are typically not used in our office and that it was probably grabbed by mistake. Informed pt that I would mail her documentation that proved she received her flu shot on 04-09-17. Pt verbalized understanding.

## 2017-04-14 NOTE — Therapy (Signed)
[facility] MAIN [facility] SERVICES [address] [place], [place], [address] Phone: [phone]   Fax:  [phone]  Physical Therapy Treatment  Patient Details  Name: Miranda [name] MRN: [id number] Date of Birth: [birth year] Referring Provider: Maryla Morrow MD; Mariah Milling (MD)  Encounter Date: 04/14/2017      PT End of Session - 04/14/17 0845    Visit Number 4   Number of Visits 13   Date for PT Re-Evaluation 05/15/17   Authorization Type BCBS   PT Start Time 0845   PT Stop Time 0930   PT Time Calculation (min) 45 min   Activity Tolerance Patient tolerated treatment well;No increased pain   Behavior During Therapy WFL for tasks assessed/performed      Past Medical History:  Diagnosis Date  . Chronic systolic CHF (congestive heart failure) (HCC)    a. TTE 02/26/2017: EF < 20%, diffuse HK, mild AI, RV sys fxn mildly reduced; b. TTE 03/03/2017: EF 55-60%, nl WM, mild AI, PASP 46  . Coronary artery disease, non-occlusive    a. LHC 02/25/2017: no angiographically significant CAD, EF <35%, moderately elevated LVEDP at 28 mmHg  . Dependent edema    bilateral legs  . Hot flashes   . HTN (hypertension)    controlled with meds;   . Migraines   . Nausea   . Ventricular fibrillation (HCC)    a. 02/25/2017: LHC without signficant CAD; b. idiopathic; c. possibly 2/2 diarrhea with electrolyte abnormalities with Imodium usage    Past Surgical History:  Procedure Laterality Date  . APPENDECTOMY  1974  . CHOLECYSTECTOMY  1984  . ICD IMPLANT N/A 03/17/2017   Procedure: ICD Implant;  Surgeon: Duke Salvia, MD;  Location: [facility] INVASIVE CV LAB;  Service: Cardiovascular;  Laterality: N/A;  . LEFT HEART CATH AND CORONARY ANGIOGRAPHY N/A 02/26/2017   Procedure: LEFT HEART CATH AND CORONARY ANGIOGRAPHY;  Surgeon: Yvonne Kendall, MD;  Location: [facility] INVASIVE CV LAB;  Service: Cardiovascular;  Laterality: N/A;  . TONSILLECTOMY  1964    There were no vitals filed  for this visit.      Subjective Assessment - 04/14/17 0854    Subjective Patient reports doing well; She reports having a good weekend. she reports compliance with HEP and has been walking regularly; Denies any pain;    Pertinent History 56 yo Female reports v-fib arrest on 02/25/17; she reports that she was down 40 min and was brought to ED by EMS; went to CCU on ice; Patient reports she was intubated for a week; She failed extubation a week later; she was then called code again; she reports that she was given a  broncoscopy and was found to have pseudomonas in both lungs;  She rpeorts 4 days later was extubated again; She was given a defibrillator September 10th; she was discharged to inpatient rehab and then has been home since 9/21; She reports that since being home she has been doing some walking but has been taking it easy; She is mostly mod I for self care ADLs but does use a shower chair; Denies any recent falls; Patient does have an 8 pound lifting restriction and to avoid excessive overhead lifting; Patient reports that she was working at [employer], but is not working right now; She is scheduled to be out until November 14th, dependening on recovery; Patient does follow up with cardiologist on 04/09/17- as of now she is not scheduled for cardiac rehab;    How long can you  sit comfortably? NA   How long can you stand comfortably? >1 hour   How long can you walk comfortably? >1 hour without difficulty; does take it slow; uses no assistive device;    Patient Stated Goals "I want to get back to my PLOF. get stronger"   Currently in Pain? No/denies           TREATMENT: Warm up on Nustep, BUE/BLE level 3 x5 min (Unbilled);  Leg press, BLE plate 82# 6E15 with min VCs to slow down LE movement for better strengthening;  Leg press, BLE plate 83# heel raises 2x15 with cues to keep knee straight for better ankle strengthening;   HOIST hamstring curl: plate #5 (09#) 4M76 with cues for  positioning to improve strengthening;  Resisted low row, plate #1 (80#) BUE 2x15 with cues to increase scapular retraction for better upper back strengthening;  Standing in parallel bars with green tband around BLE: Hip flexion SLR x15 bilaterally; Hip extension SLR x15 bilaterally; Hip abduction x15  Bilaterally; Side stepping with band around knees and knees slightly bent x10 feet each direction x3 laps each; Patient required min-moderate verbal/tactile cues for correct exercise technique including cues to slow down LE movement and avoid trunk rotation for better hip strengthening;   Sidelying Clamshells green tband 2x15 bilaterally with cues to improve positioning and avoid trunk rotation for better hip strengthening;   Patient prone: Alternate knee flexion x15 reps bilaterally to improve tissue extensibility and reduce tightness; Gluteal max hip extension (knee bent) 2x12 reps. Patient exhibits increased weakness and requires min Vcs for correct exercise technique;  Alternate hip extension SLR 2x12 bilaterally with min VCs to avoid trunk rotation for better hip strengthening;   Gait on treadmill 1.5- 2.0 mph  x6 min with 1-2 HHA with min VCs to increase step length and increase erect posture for better gait safety;    Patient denies any pain at end of session; She does report mild fatigue due to weakness; HR 100 bpm after treadmill however quickly decreased to 82 bpm; SPO2 98%; BP: 131/77 in RUE;                         PT Education - 04/14/17 0854    Education provided Yes   Education Details HEP reinforced, strengthening;    Person(s) Educated Patient   Methods Explanation;Demonstration;Verbal cues   Comprehension Verbalized understanding;Returned demonstration;Verbal cues required;Need further instruction          PT Short Term Goals - 04/03/17 1134      PT SHORT TERM GOAL #1   Title Patient will tolerate 5 seconds of single leg stance without  loss of balance to improve ability to get in and out of shower safely.   Baseline R: 5 sec, L: 0 sec   Time 4   Period Weeks   Status New   Target Date 05/01/17     PT SHORT TERM GOAL #2   Title Patient will increase BLE gross strength to 4+/5 as to improve functional strength for independent gait, increased standing tolerance and increased ADL ability.   Time 4   Period Weeks   Status New   Target Date 05/01/17           PT Long Term Goals - 04/03/17 1135      PT LONG TERM GOAL #1   Title Patient will be independent in home exercise program to improve strength/mobility for better functional independence with ADLs.  Time 6   Period Weeks   Status New   Target Date 05/15/17     PT LONG TERM GOAL #2   Title Patient will increase six minute walk test distance to >1400 feet for progression to improve gait closer to age group norms of 1800 feet with safe cardiovascular response.    Baseline 1280   Time 6   Period Weeks   Status New   Target Date 05/15/17     PT LONG TERM GOAL #3   Title Patient will be able to negotiate 4+ steps with rail assist as needed, without fatigue, modified independently to improve ability to get in/out of work/family homes.    Time 6   Period Weeks   Status New   Target Date 05/15/17               Plan - 04/14/17 0912    Clinical Impression Statement Patient instructed in advanced LE strengthening exercise. Increased repetition for increased strengthening. Patient tolerated well without increase in pain or discomfort. She does require min VCs for correct positioning/exercise technique for better strengthening. Patient would benefit from additional skilled PT intervention to improve strength, balance and gait safety;    Rehab Potential Good   Clinical Impairments Affecting Rehab Potential recent cardiac history; monitor BP   PT Frequency 2x / week   PT Duration 6 weeks   PT Treatment/Interventions ADLs/Self Care Home  Management;Cryotherapy;Moist Heat;Therapeutic activities;Therapeutic exercise;Balance training;Neuromuscular re-education;Patient/family education;Energy conservation   PT Next Visit Plan advance HEP- work on strength/balance   PT Home Exercise Plan continue as given;    Consulted and Agree with Plan of Care Patient      Patient will benefit from skilled therapeutic intervention in order to improve the following deficits and impairments:  Cardiopulmonary status limiting activity, Decreased activity tolerance, Decreased balance, Decreased endurance, Difficulty walking, Decreased strength  Visit Diagnosis: Muscle weakness (generalized)     Problem List Patient Active Problem List   Diagnosis Date Noted  . Acute blood loss anemia   . Hypoalbuminemia due to protein-calorie malnutrition (HCC)   . Benign essential HTN   . Debility   . AKI (acute kidney injury) (HCC) 03/14/2017  . Diarrhea 03/14/2017  . Bradycardia   . Cardiac arrest with ventricular fibrillation (HCC) 02/26/2017  . Cardiac arrest (HCC) 02/26/2017  . Acute respiratory failure (HCC)   . Hypokalemia   . Encounter for central line placement   . Cardiogenic shock (HCC)   . Acute pulmonary edema (HCC)   . Anoxic encephalopathy (HCC)   . Hypertension 08/16/2016  . Edema 08/16/2016  . Nausea 08/16/2016  . Class 2 obesity due to excess calories without serious comorbidity with body mass index (BMI) of 35.0 to 35.9 in adult 08/16/2016    Evadna Donaghy PT, DPT 04/14/2017, 9:28 AM  [facility] MAIN [facility] SERVICES [address] [place], [place], [address] Phone: [phone]   Fax:  [phone]  Name: Miranda [name] MRN: [id number] Date of Birth: Mar 23, 1961

## 2017-04-16 ENCOUNTER — Ambulatory Visit: Payer: BC Managed Care – PPO | Admitting: Physical Therapy

## 2017-04-16 ENCOUNTER — Encounter: Payer: Self-pay | Admitting: Physical Therapy

## 2017-04-16 DIAGNOSIS — M6281 Muscle weakness (generalized): Secondary | ICD-10-CM

## 2017-04-16 NOTE — Therapy (Signed)
[facility] [facility] MAIN [facility] SERVICES [address] [place], [place], [address] Phone: [phone]   Fax:  [phone]  Physical Therapy Treatment  Patient Details  Name: Miranda [name] MRN: [id number] Date of Birth: August 02, 1960 Referring Provider: Maryla Morrow MD; Mariah Milling (MD)  Encounter Date: 04/16/2017      PT End of Session - 04/16/17 0850    Visit Number 5   Number of Visits 13   Date for PT Re-Evaluation 05/15/17   Authorization Type BCBS   PT Start Time 0845   PT Stop Time 0930   PT Time Calculation (min) 45 min   Activity Tolerance Patient tolerated treatment well;No increased pain   Behavior During Therapy WFL for tasks assessed/performed      Past Medical History:  Diagnosis Date  . Chronic systolic CHF (congestive heart failure) (HCC)    a. TTE 02/26/2017: EF < 20%, diffuse HK, mild AI, RV sys fxn mildly reduced; b. TTE 03/03/2017: EF 55-60%, nl WM, mild AI, PASP 46  . Coronary artery disease, non-occlusive    a. LHC 02/25/2017: no angiographically significant CAD, EF <35%, moderately elevated LVEDP at 28 mmHg  . Dependent edema    bilateral legs  . Hot flashes   . HTN (hypertension)    controlled with meds;   . Migraines   . Nausea   . Ventricular fibrillation (HCC)    a. 02/25/2017: LHC without signficant CAD; b. idiopathic; c. possibly 2/2 diarrhea with electrolyte abnormalities with Imodium usage    Past Surgical History:  Procedure Laterality Date  . APPENDECTOMY  1974  . CHOLECYSTECTOMY  1984  . ICD IMPLANT N/A 03/17/2017   Procedure: ICD Implant;  Surgeon: Duke Salvia, MD;  Location: [facility] INVASIVE CV LAB;  Service: Cardiovascular;  Laterality: N/A;  . LEFT HEART CATH AND CORONARY ANGIOGRAPHY N/A 02/26/2017   Procedure: LEFT HEART CATH AND CORONARY ANGIOGRAPHY;  Surgeon: Yvonne Kendall, MD;  Location: [facility] INVASIVE CV LAB;  Service: Cardiovascular;  Laterality: N/A;  . TONSILLECTOMY  1964    There were no vitals filed  for this visit.      Subjective Assessment - 04/16/17 0850    Subjective Patient reports doing well; She reports that she is not having any pain; Reports compliance with HEP; Patient denies any shortness of breath or discomfort today;    Pertinent History 56 yo Female reports v-fib arrest on 02/25/17; she reports that she was down 40 min and was brought to ED by EMS; went to CCU on ice; Patient reports she was intubated for a week; She failed extubation a week later; she was then called code again; she reports that she was given a  broncoscopy and was found to have pseudomonas in both lungs;  She rpeorts 4 days later was extubated again; She was given a defibrillator September 10th; she was discharged to inpatient rehab and then has been home since 9/21; She reports that since being home she has been doing some walking but has been taking it easy; She is mostly mod I for self care ADLs but does use a shower chair; Denies any recent falls; Patient does have an 8 pound lifting restriction and to avoid excessive overhead lifting; Patient reports that she was working at [employer], but is not working right now; She is scheduled to be out until November 14th, dependening on recovery; Patient does follow up with cardiologist on 04/09/17- as of now she is not scheduled for cardiac rehab;    How  long can you sit comfortably? NA   How long can you stand comfortably? >1 hour   How long can you walk comfortably? >1 hour without difficulty; does take it slow; uses no assistive device;    Patient Stated Goals "I want to get back to my PLOF. get stronger"   Currently in Pain? No/denies                 TREATMENT: Warm up on treadmill 2.0-2.2   Mph x7 min (0.25 mph) with 2 HHA  With min Vcs to increase step length and improve erect posture (5 min unbilled);  Leg press, BLE plate 161# [id number] with min VCs to slow down LE movement for better strengthening;  Leg press, BLE plate 454# heel raises 2x10 with cues  to keep knee straight for better ankle strengthening;   HOIST hamstring curl: plate #6 , [id number] with cues for positioning to improve strengthening;  Resisted low row, plate #1 (19#) BUE 2x20 with cues to increase scapular retraction for better upper back strengthening;  Standing in parallel bars with green tband around BLE: Diagonal step outs with band around ankles x30 feet forward/bakcward; Patient required min-moderate verbal/tactile cues for correct exercise technique including cues to slow down LE movement and avoid trunk rotation for better hip strengthening;  Sit<>Stand from regular chair with 4# ball overhead press x10 reps; Requires supervision and cues for better forward lean to facilitate better transfer ability;   Sidelying Hip abduction SLR 2x12 bilaterally with cues to improve positioning and avoid trunk rotation for better hip strengthening;   Patient prone: Alternate knee flexion x15reps bilaterally to improve tissue extensibility and reduce tightness; Gluteal max hip extension (knee bent) 2x15 reps. Patient exhibits increased weakness and requires min Vcs for correct exercise technique;  Alternate hip extension SLR 2x15 bilaterally with min VCs to avoid trunk rotation for better hip strengthening;     Patient denies any pain at end of session; She does report mild fatigue due to weakness; Vitals after session, HR 74 bpm; SPO2 98%; BP: 117/74 in RUE;                   PT Education - 04/16/17 0850    Education provided Yes   Education Details HEP reinforced, strengthening, gait safety;    Person(s) Educated Patient   Methods Explanation;Demonstration;Verbal cues   Comprehension Verbalized understanding;Returned demonstration;Verbal cues required;Need further instruction          PT Short Term Goals - 04/03/17 1134      PT SHORT TERM GOAL #1   Title Patient will tolerate 5 seconds of single leg stance without loss of balance to improve ability  to get in and out of shower safely.   Baseline R: 5 sec, L: 0 sec   Time 4   Period Weeks   Status New   Target Date 05/01/17     PT SHORT TERM GOAL #2   Title Patient will increase BLE gross strength to 4+/5 as to improve functional strength for independent gait, increased standing tolerance and increased ADL ability.   Time 4   Period Weeks   Status New   Target Date 05/01/17           PT Long Term Goals - 04/03/17 1135      PT LONG TERM GOAL #1   Title Patient will be independent in home exercise program to improve strength/mobility for better functional independence with ADLs.   Time 6   Period  Weeks   Status New   Target Date 05/15/17     PT LONG TERM GOAL #2   Title Patient will increase six minute walk test distance to >1400 feet for progression to improve gait closer to age group norms of 1800 feet with safe cardiovascular response.    Baseline 1280   Time 6   Period Weeks   Status New   Target Date 05/15/17     PT LONG TERM GOAL #3   Title Patient will be able to negotiate 4+ steps with rail assist as needed, without fatigue, modified independently to improve ability to get in/out of work/family homes.    Time 6   Period Weeks   Status New   Target Date 05/15/17               Plan - 04/16/17 0959    Clinical Impression Statement Patient instructed in advanced LE strengthening exercise. Patient tolerated exercise well without increase in pain or discomfort. She also exhibits good vitals with advanced exercise. Advanced exercise with increased resistance/repetitions. She does report slight fatigue with sit<>Stand exercise. patient would benefit from additional skilled PT intervention to improve strength and mobility;    Rehab Potential Good   Clinical Impairments Affecting Rehab Potential recent cardiac history; monitor BP   PT Frequency 2x / week   PT Duration 6 weeks   PT Treatment/Interventions ADLs/Self Care Home Management;Cryotherapy;Moist  Heat;Therapeutic activities;Therapeutic exercise;Balance training;Neuromuscular re-education;Patient/family education;Energy conservation   PT Next Visit Plan advance HEP- work on strength/balance   PT Home Exercise Plan continue as given;    Consulted and Agree with Plan of Care Patient      Patient will benefit from skilled therapeutic intervention in order to improve the following deficits and impairments:  Cardiopulmonary status limiting activity, Decreased activity tolerance, Decreased balance, Decreased endurance, Difficulty walking, Decreased strength  Visit Diagnosis: Muscle weakness (generalized)     Problem List Patient Active Problem List   Diagnosis Date Noted  . Acute blood loss anemia   . Hypoalbuminemia due to protein-calorie malnutrition (HCC)   . Benign essential HTN   . Debility   . AKI (acute kidney injury) (HCC) 03/14/2017  . Diarrhea 03/14/2017  . Bradycardia   . Cardiac arrest with ventricular fibrillation (HCC) 02/26/2017  . Cardiac arrest (HCC) 02/26/2017  . Acute respiratory failure (HCC)   . Hypokalemia   . Encounter for central line placement   . Cardiogenic shock (HCC)   . Acute pulmonary edema (HCC)   . Anoxic encephalopathy (HCC)   . Hypertension 08/16/2016  . Edema 08/16/2016  . Nausea 08/16/2016  . Class 2 obesity due to excess calories without serious comorbidity with body mass index (BMI) of 35.0 to 35.9 in adult 08/16/2016    Tsugio Elison PT, DPT 04/16/2017, 10:02 AM  [place] [facility] MAIN [facility] SERVICES [address] [place], [place], [address] Phone: [phone]   Fax:  [phone]  Name: Scottie Kriston MRN: [id number] Date of Birth: 1961-04-20

## 2017-04-22 ENCOUNTER — Encounter: Payer: Self-pay | Admitting: Physical Therapy

## 2017-04-22 ENCOUNTER — Ambulatory Visit: Payer: BC Managed Care – PPO | Admitting: Physical Therapy

## 2017-04-22 DIAGNOSIS — M6281 Muscle weakness (generalized): Secondary | ICD-10-CM | POA: Diagnosis not present

## 2017-04-22 NOTE — Therapy (Signed)
[facility] MAIN [facility] SERVICES [address] [place], [place], [address] Phone: [phone]   Fax:  [phone]  Physical Therapy Treatment  Patient Details  Name: Miranda [name] MRN: [id number] Date of Birth: [birth year] Referring Provider: Maryla Morrow MD; Mariah Milling (MD)  Encounter Date: 04/22/2017      PT End of Session - 04/22/17 0806    Visit Number 6   Number of Visits 13   Date for PT Re-Evaluation 05/15/17   Authorization Type BCBS   PT Start Time 0800   PT Stop Time 0845   PT Time Calculation (min) 45 min   Activity Tolerance Patient tolerated treatment well;No increased pain   Behavior During Therapy WFL for tasks assessed/performed      Past Medical History:  Diagnosis Date  . Chronic systolic CHF (congestive heart failure) (HCC)    a. TTE 02/26/2017: EF < 20%, diffuse HK, mild AI, RV sys fxn mildly reduced; b. TTE 03/03/2017: EF 55-60%, nl WM, mild AI, PASP 46  . Coronary artery disease, non-occlusive    a. LHC 02/25/2017: no angiographically significant CAD, EF <35%, moderately elevated LVEDP at 28 mmHg  . Dependent edema    bilateral legs  . Hot flashes   . HTN (hypertension)    controlled with meds;   . Migraines   . Nausea   . Ventricular fibrillation (HCC)    a. 02/25/2017: LHC without signficant CAD; b. idiopathic; c. possibly 2/2 diarrhea with electrolyte abnormalities with Imodium usage    Past Surgical History:  Procedure Laterality Date  . APPENDECTOMY  1974  . CHOLECYSTECTOMY  1984  . ICD IMPLANT N/A 03/17/2017   Procedure: ICD Implant;  Surgeon: Duke Salvia, MD;  Location: [facility] INVASIVE CV LAB;  Service: Cardiovascular;  Laterality: N/A;  . LEFT HEART CATH AND CORONARY ANGIOGRAPHY N/A 02/26/2017   Procedure: LEFT HEART CATH AND CORONARY ANGIOGRAPHY;  Surgeon: Yvonne Kendall, MD;  Location: [facility] INVASIVE CV LAB;  Service: Cardiovascular;  Laterality: N/A;  . TONSILLECTOMY  1964    There were no vitals filed  for this visit.      Subjective Assessment - 04/22/17 0805    Subjective Patient reports doing a lot of shopping one day and a lot of walking. "I walked 4 miles and I was so sore." She reports taking it easy over the weekend and reports feeling better now. Denies any pain or soreness today;    Pertinent History 56 yo Female reports v-fib arrest on 02/25/17; she reports that she was down 40 min and was brought to ED by EMS; went to CCU on ice; Patient reports she was intubated for a week; She failed extubation a week later; she was then called code again; she reports that she was given a  broncoscopy and was found to have pseudomonas in both lungs;  She rpeorts 4 days later was extubated again; She was given a defibrillator September 10th; she was discharged to inpatient rehab and then has been home since 9/21; She reports that since being home she has been doing some walking but has been taking it easy; She is mostly mod I for self care ADLs but does use a shower chair; Denies any recent falls; Patient does have an 8 pound lifting restriction and to avoid excessive overhead lifting; Patient reports that she was working at [employer], but is not working right now; She is scheduled to be out until November 14th, dependening on recovery; Patient does follow up with cardiologist  on 04/09/17- as of now she is not scheduled for cardiac rehab;    How long can you sit comfortably? NA   How long can you stand comfortably? >1 hour   How long can you walk comfortably? >1 hour without difficulty; does take it slow; uses no assistive device;    Patient Stated Goals "I want to get back to my PLOF. get stronger"   Currently in Pain? No/denies        TREATMENT: Warm up on treadmill 2.0-2.2   Mph x7 min (0.25 mph) with 2 HHA  With min Vcs to increase step length and improve erect posture (5 min unbilled);  Leg press, BLE plate 034# [id number] with min VCs to slow down LE movement for better strengthening;  Leg press, BLE  plate 956# heel raises 2x12 with cues to keep knee straight for better ankle strengthening;   HOIST hamstring curl: plate #6 , [id number] cues for positioning to improve strengthening;  Resisted low row, plate #1 (29#) BUE 2x20with cues to increase scapular retraction for better upper back strengthening;  Resisted walking 17.5# forward/backward, side/side (4 way) x3 laps each direction with supervision and cues to slow down eccentric return for better motor control;  Squat with 4# weighted ball to floor and then stand and toss ball 2x10 with min Vcs for technique for better strengthening;  Sidelying Hip abduction SLR 2# 2x10 bilaterally with cues to improve positioning and avoid trunk rotation for better hip strengthening;   Supine: SLR flexion 2# 2x10 with cues to avoid excessive leg lift/increase core stabilization for better hip strengthening;  Supine: Bridges with arms across chest x15 exhibiting good hip/core control;   Patient prone: Alternate knee flexion 2# BLE,2x15reps bilaterally to improve tissue extensibility and reduce tightness; Gluteal max hip extension (knee bent) 2#, 2x10reps. Patient exhibits increased weakness and requires min Vcs for correct exercise technique;  Alternate hip extension SLR 2#,  2x10bilaterally with min VCs to avoid trunk rotation for better hip strengthening;     Patient denies any pain at end of session; She does report mild fatigue due to weakness; Vitals after session, HR 88 bpm; SPO2 98%; BP: 162/82 in RUE (increased BP indicative of working harder this session);                             PT Education - 04/22/17 0806    Education provided Yes   Education Details strengthening, gait safety, HEP reinforced;    Person(s) Educated Patient   Methods Explanation;Demonstration;Verbal cues   Comprehension Verbalized understanding;Verbal cues required;Returned demonstration;Need further instruction          PT  Short Term Goals - 04/03/17 1134      PT SHORT TERM GOAL #1   Title Patient will tolerate 5 seconds of single leg stance without loss of balance to improve ability to get in and out of shower safely.   Baseline R: 5 sec, L: 0 sec   Time 4   Period Weeks   Status New   Target Date 05/01/17     PT SHORT TERM GOAL #2   Title Patient will increase BLE gross strength to 4+/5 as to improve functional strength for independent gait, increased standing tolerance and increased ADL ability.   Time 4   Period Weeks   Status New   Target Date 05/01/17           PT Long Term Goals - 04/03/17 1135  PT LONG TERM GOAL #1   Title Patient will be independent in home exercise program to improve strength/mobility for better functional independence with ADLs.   Time 6   Period Weeks   Status New   Target Date 05/15/17     PT LONG TERM GOAL #2   Title Patient will increase six minute walk test distance to >1400 feet for progression to improve gait closer to age group norms of 1800 feet with safe cardiovascular response.    Baseline 1280   Time 6   Period Weeks   Status New   Target Date 05/15/17     PT LONG TERM GOAL #3   Title Patient will be able to negotiate 4+ steps with rail assist as needed, without fatigue, modified independently to improve ability to get in/out of work/family homes.    Time 6   Period Weeks   Status New   Target Date 05/15/17               Plan - 04/22/17 0845    Clinical Impression Statement Patient instructed in advanced LE strengthening; Advanced exercise with increased resistance. Patient did have some difficulty with prone exercise with added 2# ankle weights. She denies any discomfort at end of session but does report some fatigue. She would benefit from additional skilled PT intervention to improve strength and mobility;    Rehab Potential Good   Clinical Impairments Affecting Rehab Potential recent cardiac history; monitor BP   PT Frequency 2x  / week   PT Duration 6 weeks   PT Treatment/Interventions ADLs/Self Care Home Management;Cryotherapy;Moist Heat;Therapeutic activities;Therapeutic exercise;Balance training;Neuromuscular re-education;Patient/family education;Energy conservation   PT Next Visit Plan advance HEP- work on strength/balance   PT Home Exercise Plan continue as given;    Consulted and Agree with Plan of Care Patient      Patient will benefit from skilled therapeutic intervention in order to improve the following deficits and impairments:  Cardiopulmonary status limiting activity, Decreased activity tolerance, Decreased balance, Decreased endurance, Difficulty walking, Decreased strength  Visit Diagnosis: Muscle weakness (generalized)     Problem List Patient Active Problem List   Diagnosis Date Noted  . Acute blood loss anemia   . Hypoalbuminemia due to protein-calorie malnutrition (HCC)   . Benign essential HTN   . Debility   . AKI (acute kidney injury) (HCC) 03/14/2017  . Diarrhea 03/14/2017  . Bradycardia   . Cardiac arrest with ventricular fibrillation (HCC) 02/26/2017  . Cardiac arrest (HCC) 02/26/2017  . Acute respiratory failure (HCC)   . Hypokalemia   . Encounter for central line placement   . Cardiogenic shock (HCC)   . Acute pulmonary edema (HCC)   . Anoxic encephalopathy (HCC)   . Hypertension 08/16/2016  . Edema 08/16/2016  . Nausea 08/16/2016  . Class 2 obesity due to excess calories without serious comorbidity with body mass index (BMI) of 35.0 to 35.9 in adult 08/16/2016    Miranda [name] PT, DPT 04/22/2017, 8:46 AM  [facility] MAIN [facility] SERVICES [address] [place], [place], [address] Phone: [phone]   Fax:  [phone]  Name: Miranda [name] MRN: [id number] Date of Birth: 1960/11/12

## 2017-04-24 ENCOUNTER — Encounter: Payer: Self-pay | Admitting: Internal Medicine

## 2017-04-24 ENCOUNTER — Ambulatory Visit (INDEPENDENT_AMBULATORY_CARE_PROVIDER_SITE_OTHER): Payer: BC Managed Care – PPO | Admitting: Internal Medicine

## 2017-04-24 ENCOUNTER — Encounter: Payer: Self-pay | Admitting: Physical Therapy

## 2017-04-24 ENCOUNTER — Ambulatory Visit: Payer: BC Managed Care – PPO | Admitting: Physical Therapy

## 2017-04-24 VITALS — BP 128/80 | HR 68 | Resp 16 | Ht 60.0 in | Wt 180.0 lb

## 2017-04-24 DIAGNOSIS — M6281 Muscle weakness (generalized): Secondary | ICD-10-CM

## 2017-04-24 DIAGNOSIS — R05 Cough: Secondary | ICD-10-CM

## 2017-04-24 DIAGNOSIS — I469 Cardiac arrest, cause unspecified: Secondary | ICD-10-CM | POA: Diagnosis not present

## 2017-04-24 DIAGNOSIS — G4719 Other hypersomnia: Secondary | ICD-10-CM | POA: Diagnosis not present

## 2017-04-24 DIAGNOSIS — R490 Dysphonia: Secondary | ICD-10-CM

## 2017-04-24 DIAGNOSIS — R059 Cough, unspecified: Secondary | ICD-10-CM

## 2017-04-24 NOTE — Therapy (Signed)
[place] [facility] SERVICES [address] [place], [place], [address] Phone: [phone]   Fax:  [phone]  Physical Therapy Treatment/Discharge Summary  Patient Details  Name: Miranda [name] MRN: [id number] Date of Birth: 1961-05-18 Referring Provider: Delice Lesch MD; Rockey Situ (MD)  Encounter Date: 04/24/2017      PT End of Session - 04/24/17 1222    Visit Number 7   Number of Visits 13   Date for PT Re-Evaluation 05/15/17   Authorization Type BCBS   PT Start Time 0803   PT Stop Time 0845   PT Time Calculation (min) 42 min   Activity Tolerance Patient tolerated treatment well;No increased pain   Behavior During Therapy WFL for tasks assessed/performed      Past Medical History:  Diagnosis Date  . Chronic systolic CHF (congestive heart failure) ([place])    a. TTE 02/26/2017: EF < 20%, diffuse HK, mild AI, RV sys fxn mildly reduced; b. TTE 03/03/2017: EF 55-60%, nl WM, mild AI, PASP 46  . Coronary artery disease, non-occlusive    a. LHC 02/25/2017: no angiographically significant CAD, EF <35%, moderately elevated LVEDP at 28 mmHg  . Dependent edema    bilateral legs  . Hot flashes   . HTN (hypertension)    controlled with meds;   . Migraines   . Nausea   . Ventricular fibrillation ([place])    a. 02/25/2017: LHC without signficant CAD; b. idiopathic; c. possibly 2/2 diarrhea with electrolyte abnormalities with Imodium usage    Past Surgical History:  Procedure Laterality Date  . APPENDECTOMY  1974  . CHOLECYSTECTOMY  1984  . ICD IMPLANT N/A 03/17/2017   Procedure: ICD Implant;  Surgeon: Deboraha Sprang, MD;  Location: [facility];  Service: Cardiovascular;  Laterality: N/A;  . LEFT HEART CATH AND CORONARY ANGIOGRAPHY N/A 02/26/2017   Procedure: LEFT HEART CATH AND CORONARY ANGIOGRAPHY;  Surgeon: Nelva Bush, MD;  Location: [facility];  Service: Cardiovascular;  Laterality: N/A;  . TONSILLECTOMY  1964    There were  no vitals filed for this visit.      Subjective Assessment - 04/24/17 0842    Subjective Patient reports doing well; Denies any pain; reports walking a lot yesterday and was sore last night. "I slept good though and I think that's from walking more.    Pertinent History 56 yo Female reports v-fib arrest on 02/25/17; she reports that she was down 40 min and was brought to ED by EMS; went to CCU on ice; Patient reports she was intubated for a week; She failed extubation a week later; she was then called code again; she reports that she was given a  broncoscopy and was found to have pseudomonas in both lungs;  She rpeorts 4 days later was extubated again; She was given a defibrillator September 10th; she was discharged to inpatient rehab and then has been home since 9/21; She reports that since being home she has been doing some walking but has been taking it easy; She is mostly mod I for self care ADLs but does use a shower chair; Denies any recent falls; Patient does have an 8 pound lifting restriction and to avoid excessive overhead lifting; Patient reports that she was working at [employer], but is not working right now; She is scheduled to be out until November 14th, dependening on recovery; Patient does follow up with cardiologist on 04/09/17- as of now she is not scheduled for cardiac rehab;  How long can you sit comfortably? NA   How long can you stand comfortably? >1 hour   How long can you walk comfortably? >1 hour without difficulty; does take it slow; uses no assistive device;    Patient Stated Goals "I want to get back to my PLOF. get stronger"   Currently in Pain? No/denies            [facility] PT Assessment - 04/24/17 0001      Observation/Other Assessments   Lower Extremity Functional Scale  68/80 (the higher the score the less disability)     Strength   Right Hip Flexion 4/5   Right Hip Extension 4/5   Right Hip External Rotation  4+/5   Right Hip ABduction 4+/5   Right Hip  ADduction 4+/5   Left Hip Flexion 4+/5   Left Hip Extension 4/5   Left Hip ABduction 4+/5   Left Hip ADduction 4+/5   Right Knee Flexion 4+/5   Right Knee Extension 4+/5   Left Knee Flexion 4+/5   Left Knee Extension 4+/5   Right Ankle Dorsiflexion 4+/5   Left Ankle Dorsiflexion 4+/5     Ambulation/Gait   Stair Management Technique No rails;Forwards;Alternating pattern  independent   Number of Stairs 8   Height of Stairs 4     6 Minute Walk- Baseline   BP (mmHg) 125/78   HR (bpm) 68   02 Sat (%RA) 99 %     6 Minute walk- Post Test   BP (mmHg) 140/78   HR (bpm) 75   02 Sat (%RA) 100 %     6 minute walk test results    Aerobic Endurance Distance Walked 1385   Endurance additional comments community ambulator; improved from initial eval which was 1280     High Level Balance   High Level Balance Comments SLS: R 10 sec, L: 8 sec; improved from initial eval: R: 5 sec, L: 0 sec;          TREATMENT: Instructed patient in 6 min walk, assessed strength/balance etc to address goals; See above;    Leg press, BLE plate 105# [id number] with min VCs to slow down LE movement for better strengthening;  Leg press, BLE plate 105# heel raises 3x15with cues to keep knee straight for better ankle strengthening;   HOIST hamstring curl: plate #6 , [id number] cues for positioning to improve strengthening;  Resisted low row, plate #1 (25#) BUE [id number] cues to increase scapular retraction for better upper back strengthening;  Squat with 4# weighted ball to floor and then stand and toss ball 2x10 with min Vcs for technique for better strengthening;   Patient denies any pain at end of session; She does report mild fatigue due to weakness;                     PT Education - 04/24/17 1222    Education provided Yes   Education Details progress towards goals, strengthening;    Person(s) Educated Patient   Methods Explanation;Demonstration;Verbal cues   Comprehension  Verbalized understanding;Returned demonstration;Verbal cues required          PT Short Term Goals - 04/24/17 0821      PT SHORT TERM GOAL #1   Title Patient will tolerate 5 seconds of single leg stance without loss of balance to improve ability to get in and out of shower safely.   Baseline R: 10 sec, L: 8 sec;    Time 4  Period Weeks   Status Achieved     PT SHORT TERM GOAL #2   Title Patient will increase BLE gross strength to 4+/5 as to improve functional strength for independent gait, increased standing tolerance and increased ADL ability.   Baseline grossly 4+/5 with exception of 4/5 in hip flexion and extension;    Time 4   Period Weeks   Status Partially Met   Target Date 05/01/17           PT Long Term Goals - 04/24/17 0824      PT LONG TERM GOAL #1   Title Patient will be independent in home exercise program to improve strength/mobility for better functional independence with ADLs.   Time 6   Period Weeks   Status Achieved   Target Date 05/15/17     PT LONG TERM GOAL #2   Title Patient will increase six minute walk test distance to >1400 feet for progression to improve gait closer to age group norms of 1800 feet with safe cardiovascular response.    Baseline 1280   Time 6   Period Weeks   Status Partially Met   Target Date 05/15/17     PT LONG TERM GOAL #3   Title Patient will be able to negotiate 4+ steps with rail assist as needed, without fatigue, modified independently to improve ability to get in/out of work/family homes.    Time 6   Period Weeks   Status Achieved   Target Date 05/15/17               Plan - 04/24/17 1223    Clinical Impression Statement Patient instructed in outcome measures to assess progress towards goals. She has made significant improvements in strength and mobility. She has met some goals and has made progress towards all goals. Patient is scheduled to start cardiac rehab next week. Will discharge her from outpatient  PT at this time as she will be working on endurance and mobility with cardiac rehab. Patient instructed in ways to advance strengthening to tolerance. She is independent in HEP. patient is appropriate for DC at this time and is agreeable to stopping skilled intervention.    Rehab Potential Good   Clinical Impairments Affecting Rehab Potential recent cardiac history; monitor BP   PT Frequency 2x / week   PT Duration 6 weeks   PT Treatment/Interventions ADLs/Self Care Home Management;Cryotherapy;Moist Heat;Therapeutic activities;Therapeutic exercise;Balance training;Neuromuscular re-education;Patient/family education;Energy conservation   PT Next Visit Plan advance HEP- work on strength/balance   PT [place] continue as given;    Consulted and Agree with Plan of Care Patient      Patient will benefit from skilled therapeutic intervention in order to improve the following deficits and impairments:  Cardiopulmonary status limiting activity, Decreased activity tolerance, Decreased balance, Decreased endurance, Difficulty walking, Decreased strength  Visit Diagnosis: Muscle weakness (generalized)     Problem List Patient Active Problem List   Diagnosis Date Noted  . Acute blood loss anemia   . Hypoalbuminemia due to protein-calorie malnutrition ([place])   . Benign essential HTN   . Debility   . AKI (acute kidney injury) ([place]) 03/14/2017  . Diarrhea 03/14/2017  . Bradycardia   . Cardiac arrest with ventricular fibrillation ([place]) 02/26/2017  . Cardiac arrest ([place]) 02/26/2017  . Acute respiratory failure ([place])   . Hypokalemia   . Encounter for central line placement   . Cardiogenic shock ([place])   . Acute pulmonary edema ([facility])   . Anoxic encephalopathy (  [place])   . Hypertension 08/16/2016  . Edema 08/16/2016  . Nausea 08/16/2016  . Class 2 obesity due to excess calories without serious comorbidity with body mass index (BMI) of 35.0 to 35.9 in adult 08/16/2016  . Open-angle glaucoma  08/13/2012  . Encounter for routine gynecological examination 08/23/2011  . Cortical senile cataract 11/02/2010  . Vitreous degeneration 11/02/2010  . Borderline glaucoma, open angle with borderline findings 11/01/2010  . Myopia 11/01/2010  . Presbyopia 11/01/2010    Donnald Tabar PT, DPT 04/24/2017, 12:25 PM  [place] [facility] SERVICES [address] [place], [place], [address] Phone: [phone]   Fax:  [phone]  Name: Miranda [name] MRN: [id number] Date of Birth: [birth year]

## 2017-04-24 NOTE — Patient Instructions (Signed)
ENT referral for Hoarseness Obtain sleep study to assess for OSA Follow up Cardiology and GI

## 2017-04-24 NOTE — Progress Notes (Signed)
Name: Miranda LinkLori [name] MRN: [id number] DOB: [birth year]     CONSULTATION DATE: (Not on file)  REFERRING MD :  Jacklynn GanongVaicahni  CHIEF COMPLAINT:  Follow up cardiac arrest  HISTORY OF PRESENT ILLNESS:  56 year old pleasant white female seen today for follow-up for significant history for cardiac arrest and hypothermia protocol approximately 6 weeks ago  Patient underwent extensive cardiac workup which included cardiac catheterization along with defibrillator placement Patient has completely recovered from her extensive hospitalization Patient does not have any  Or chronic shortness of breath Patient is not a smoker  Patient presented with acute on chronic diarrhea with abnormal electrolytes which most likely led to her cardiac arrest and then had prolonged ICU and hospitalization and now with complete resolution of symptoms  Patient has been started on CRRT oh and for her diarrhea and her GI symptoms have completely resolved Patient is to undergo further cardiac evaluation with follow-up appointment with Dr. Lewie LoronGolan  Patient does complain of excessive daytime sleepiness Patient has been having extreme fatigue and tiredness, lack of energy +  very Loud snoring every night  There are no signs of infection at this time No signs of congestive heart failure at this time No signs of respiratory distress at this time  Patient's only complaint is hoarseness of voice which has been going on since her discharge from the hospital Patient had been intubated for approximately 10 days     PAST MEDICAL HISTORY :   has a past medical history of Chronic systolic CHF (congestive heart failure) (H[facility]); Coronary artery disease, non-occlusive; Dependent edema; Hot flashes; HTN (hypertension); Migraines; Nausea; and Ventricular fibrillation ([facility]).  has a past surgical history that includes Cholecystectomy (1984); Appendectomy (1974); Tonsillectomy (1964); LEFT HEART CATH AND CORONARY ANGIOGRAPHY (N/A, 02/26/2017);  and ICD IMPLANT (N/A, 03/17/2017). Prior to Admission medications   Medication Sig Start Date End Date Taking? Authorizing Provider  aspirin EC 81 MG EC tablet Take 1 tablet (81 mg total) by mouth daily. 03/19/17  Yes Sheilah PigeonUrsuy, Renee Lynn, PA-C  carvedilol (COREG) 3.125 MG tablet Take 1 tablet (3.125 mg total) by mouth 2 (two) times daily with a meal. 04/10/17  Yes Duke SalviaKlein, Steven C, MD  lipase/protease/amylase (CREON) 36000 UNITS CPEP capsule Take 2 capsules (72,000 Units total) by mouth 3 (three) times daily before meals. 03/28/17  Yes Angiulli, Mcarthur Rossettianiel J, PA-C  potassium chloride SA (K-DUR,KLOR-CON) 20 MEQ tablet Take 1 tablet (20 mEq total) by mouth daily. 04/10/17  Yes Duke SalviaKlein, Steven C, MD  QUEtiapine (SEROQUEL) 25 MG tablet Take 1 tablet (25 mg total) by mouth at bedtime. 03/28/17  Yes Angiulli, Mcarthur Rossettianiel J, PA-C   Allergies  Allergen Reactions  . Ace Inhibitors   . Compazine [Prochlorperazine Edisylate]   . Lisinopril   . Penicillins     Has patient had a PCN reaction causing immediate rash, facial/tongue/throat swelling, SOB or lightheadedness with hypotension: Unknown Has patient had a PCN reaction causing severe rash involving mucus membranes or skin necrosis: Unknown Has patient had a PCN reaction that required hospitalization: Unknown Has patient had a PCN reaction occurring within the last 10 years: Unknown If all of the above answers are "NO", then may proceed with Cephalosporin use.     FAMILY HISTORY:  family history includes Heart disease in her father; Hyperlipidemia in her father; Hypertension in her brother, father, and sister; Lymphoma in her mother; Migraines in her sister; Stroke in her father. SOCIAL HISTORY:  reports that she has never smoked. She has never used smokeless tobacco.  She reports that she does not drink alcohol or use drugs.  REVIEW OF SYSTEMS:   Constitutional: Negative for fever, chills, weight loss, malaise/fatigue and diaphoresis.  HENT: Negative for hearing  loss, ear pain, nosebleeds, congestion, sore throat, neck pain, tinnitus and ear discharge.   Eyes: Negative for blurred vision, double vision, photophobia, pain, discharge and redness.  Respiratory: + cough, hemoptysis, sputum production, shortness of breath, wheezing and stridor. +hoarseness   Cardiovascular: Negative for chest pain, palpitations, orthopnea, claudication, leg swelling and PND.  Gastrointestinal: Negative for heartburn, nausea, vomiting, abdominal pain, diarrhea, constipation, blood in stool and melena.  Genitourinary: Negative for dysuria, urgency, frequency, hematuria and flank pain.  Musculoskeletal: Negative for myalgias, back pain, joint pain and falls.  Skin: Negative for itching and rash.  Neurological: Negative for dizziness, tingling, tremors, sensory change, speech change, focal weakness, seizures, loss of consciousness, weakness and headaches.  Endo/Heme/Allergies: Negative for environmental allergies and polydipsia. Does not bruise/bleed easily.  ALL OTHER ROS ARE NEGATIVE    VITAL SIGNS: BP 128/80 (BP Location: Left Arm, Cuff Size: Normal)   Pulse 68   Resp 16   Ht 5' (1.524 m)   Wt 180 lb (81.6 kg)   SpO2 97%   BMI 35.15 kg/m    Physical Examination:   GENERAL:NAD, no fevers, chills, no weakness no fatigue HEAD: Normocephalic, atraumatic.  EYES: Pupils equal, round, reactive to light. Extraocular muscles intact. No scleral icterus.  MOUTH: Moist mucosal membrane.   EAR, NOSE, THROAT: Clear without exudates. No external lesions.  NECK: Supple. No thyromegaly. No nodules. No JVD.  PULMONARY:CTA B/L no wheezes, no crackles, no rhonchi CARDIOVASCULAR: S1 and S2. Regular rate and rhythm. No murmurs, rubs, or gallops. No edema.  GASTROINTESTINAL: Soft, nontender, nondistended. No masses. Positive bowel sounds.  MUSCULOSKELETAL: No swelling, clubbing, or edema. Range of motion full in all extremities.  NEUROLOGIC: Cranial nerves II through XII are  intact. No gross focal neurological deficits.  SKIN: No ulceration, lesions, rashes, or cyanosis. Skin warm and dry. Turgor intact.  PSYCHIATRIC: Mood, affect within normal limits. The patient is awake, alert and oriented x 3. Insight, judgment intact.      ASSESSMENT / PLAN: 56 year old pleasant while with a significant history cardiac arrest and arrhythmias leading to ICU admission and hypothermia protocol in the setting of chronic diarrhea with electrolyte abnormalities and at this time patient does not have any acute signs of respiratory disease or cardiac issues at this time, however patient does have signs and symptoms of probable underlying sleep apnea with a given history of excessive daytime sleepiness and snoring  #1 hoarse voice Patient will need ENT referral for vocal cord dysfunction and upper airway assessment  #2 excessive daytime sleepiness with snoring and a history of arrhythmias Patient will need this sleep study to assess for underlying sleep apnea  #3cardiac arrhythmias  status post defibrillator placement Agent will need to follow-up with cardiology  #4chronic diarrhea-has resolved with medication Patient will need to follow-up with GI    Patient\ satisfied with Plan of action and management. All questions answered Follow up after sleep study completed  Lucie Leather, M.D.  [facility]  Medical Director [facility] Medical Director [facility] Cardio-Pulmonary Department

## 2017-04-28 ENCOUNTER — Encounter: Payer: BC Managed Care – PPO | Attending: Cardiovascular Disease | Admitting: *Deleted

## 2017-04-28 ENCOUNTER — Encounter: Payer: Self-pay | Admitting: *Deleted

## 2017-04-28 ENCOUNTER — Ambulatory Visit: Payer: BC Managed Care – PPO | Admitting: Physical Therapy

## 2017-04-28 VITALS — Ht 61.1 in | Wt 181.1 lb

## 2017-04-28 DIAGNOSIS — I4901 Ventricular fibrillation: Secondary | ICD-10-CM | POA: Diagnosis not present

## 2017-04-28 DIAGNOSIS — Z7982 Long term (current) use of aspirin: Secondary | ICD-10-CM | POA: Insufficient documentation

## 2017-04-28 DIAGNOSIS — I110 Hypertensive heart disease with heart failure: Secondary | ICD-10-CM | POA: Insufficient documentation

## 2017-04-28 DIAGNOSIS — I2510 Atherosclerotic heart disease of native coronary artery without angina pectoris: Secondary | ICD-10-CM | POA: Diagnosis not present

## 2017-04-28 DIAGNOSIS — I469 Cardiac arrest, cause unspecified: Secondary | ICD-10-CM | POA: Insufficient documentation

## 2017-04-28 DIAGNOSIS — Z79899 Other long term (current) drug therapy: Secondary | ICD-10-CM | POA: Insufficient documentation

## 2017-04-28 DIAGNOSIS — I5022 Chronic systolic (congestive) heart failure: Secondary | ICD-10-CM | POA: Diagnosis not present

## 2017-04-28 NOTE — Patient Instructions (Signed)
Patient Instructions  Patient Details  Name: Miranda LinkLori [name] MRN: [id number] Date of Birth: 02-08-1961 Referring Provider:  Antonieta IbaGollan, Timothy J, MD  Below are the personal goals you chose as well as exercise and nutrition goals. Our goal is to help you keep on track towards obtaining and maintaining your goals. We will be discussing your progress on these goals with you throughout the program.  Initial Exercise Prescription:     Initial Exercise Prescription - 04/28/17 1200      Date of Initial Exercise RX and Referring Provider   Date 04/28/17   Referring Provider Julien NordmannGollan, Timothy MD     Treadmill   MPH 2.8   Grade 1   Minutes 15   METs 3.5     NuStep   Level 2   SPM 80   Minutes 15   METs 3     REL-XR   Level 2   Watts 50   Speed 2.8   Minutes 15   METs 3     Prescription Details   Frequency (times per week) 2   Duration Progress to 45 minutes of aerobic exercise without signs/symptoms of physical distress     Intensity   THRR 40-80% of Max Heartrate 65-130   Ratings of Perceived Exertion 11-13   Perceived Dyspnea 0-4     Progression   Progression Continue progressive overload as per policy without signs/symptoms or physical distress.     Resistance Training   Training Prescription Yes   Weight 4   Reps 10-15      Exercise Goals: Frequency: Be able to perform aerobic exercise three times per week working toward 3-5 days per week.  Intensity: Work with a perceived exertion of 11 (fairly light) - 15 (hard) as tolerated. Follow your new exercise prescription and watch for changes in prescription as you progress with the program. Changes will be reviewed with you when they are made.  Duration: You should be able to do 30 minutes of continuous aerobic exercise in addition to a 5 minute warm-up and a 5 minute cool-down routine.  Nutrition Goals: Your personal nutrition goals will be established when you do your nutrition analysis with the dietician.  The  following are nutrition guidelines to follow: Cholesterol < 200mg /day Sodium < 1500mg /day Fiber: Women over 50 yrs - 21 grams per day  Personal Goals:     Personal Goals and Risk Factors at Admission - 04/28/17 1205      Core Components/Risk Factors/Patient Goals on Admission    Weight Management Obesity;Weight Loss;Yes   Intervention Weight Management: Develop a combined nutrition and exercise program designed to reach desired caloric intake, while maintaining appropriate intake of nutrient and fiber, sodium and fats, and appropriate energy expenditure required for the weight goal.;Weight Management/Obesity: Establish reasonable short term and long term weight goals.   Admit Weight 181 lb (82.1 kg)  lost 30 pounds while in the hospital   Goal Weight: Short Term 178 lb (80.7 kg)   Goal Weight: Long Term 151 lb (68.5 kg)   Expected Outcomes Long Term: Adherence to nutrition and physical activity/exercise program aimed toward attainment of established weight goal;Weight Loss: Understanding of general recommendations for a balanced deficit meal plan, which promotes 1-2 lb weight loss per week and includes a negative energy balance of [phone] kcal/d;Understanding of distribution of calorie intake throughout the day with the consumption of 4-5 meals/snacks;Understanding recommendations for meals to include 15-35% energy as protein, 25-35% energy from fat, 35-60% energy from carbohydrates, less than  200mg  of dietary cholesterol, 20-35 gm of total fiber daily   Hypertension Yes   Intervention Provide education on lifestyle modifcations including regular physical activity/exercise, weight management, moderate sodium restriction and increased consumption of fresh fruit, vegetables, and low fat dairy, alcohol moderation, and smoking cessation.;Monitor prescription use compliance.   Expected Outcomes Short Term: Continued assessment and intervention until BP is < 140/18mm HG in hypertensive participants. <  130/69mm HG in hypertensive participants with diabetes, heart failure or chronic kidney disease.;Long Term: Maintenance of blood pressure at goal levels.      Tobacco Use Initial Evaluation: History  Smoking Status  . Never Smoker  Smokeless Tobacco  . Never Used    Exercise Goals and Review:     Exercise Goals    Row Name 04/28/17 1253             Exercise Goals   Increase Physical Activity Yes       Intervention Provide advice, education, support and counseling about physical activity/exercise needs.;Develop an individualized exercise prescription for aerobic and resistive training based on initial evaluation findings, risk stratification, comorbidities and participant's personal goals.       Expected Outcomes Achievement of increased cardiorespiratory fitness and enhanced flexibility, muscular endurance and strength shown through measurements of functional capacity and personal statement of participant.       Increase Strength and Stamina Yes       Intervention Provide advice, education, support and counseling about physical activity/exercise needs.;Develop an individualized exercise prescription for aerobic and resistive training based on initial evaluation findings, risk stratification, comorbidities and participant's personal goals.       Expected Outcomes Achievement of increased cardiorespiratory fitness and enhanced flexibility, muscular endurance and strength shown through measurements of functional capacity and personal statement of participant.       Able to understand and use rate of perceived exertion (RPE) scale Yes       Intervention Provide education and explanation on how to use RPE scale       Expected Outcomes Short Term: Able to use RPE daily in rehab to express subjective intensity level;Long Term:  Able to use RPE to guide intensity level when exercising independently       Knowledge and understanding of Target Heart Rate Range (THRR) Yes       Intervention  Provide education and explanation of THRR including how the numbers were predicted and where they are located for reference       Expected Outcomes Short Term: Able to state/look up THRR;Short Term: Able to use daily as guideline for intensity in rehab;Long Term: Able to use THRR to govern intensity when exercising independently       Able to check pulse independently Yes       Intervention Provide education and demonstration on how to check pulse in carotid and radial arteries.;Review the importance of being able to check your own pulse for safety during independent exercise       Expected Outcomes Short Term: Able to explain why pulse checking is important during independent exercise;Long Term: Able to check pulse independently and accurately       Understanding of Exercise Prescription Yes       Intervention Provide education, explanation, and written materials on patient's individual exercise prescription       Expected Outcomes Short Term: Able to explain program exercise prescription;Long Term: Able to explain home exercise prescription to exercise independently          Copy of goals given  to participant.

## 2017-04-28 NOTE — Progress Notes (Signed)
Daily Session Note  Patient Details  Name: Miranda [name] MRN: [id number] Date of Birth: 01-26-1961 Referring Provider:     Cardiac Rehab from 04/28/2017 in [facility]  Referring Provider  Ida Rogue MD      Encounter Date: 04/28/2017  Check In:     Session Check In - 04/28/17 1201      Check-In   Location [facility]-Cardiac & Pulmonary Rehab   Staff Present Heath Lark, RN, BSN, CCRP;Jessica Caldwell, MA, ACSM RCEP, Exercise Physiologist;Carroll Enterkin, RN, BSN   Supervising physician immediately available to respond to emergencies See telemetry face sheet for immediately available ER MD   Medication changes reported     No   Warm-up and Cool-down Performed as group-led instruction   Resistance Training Performed Yes   VAD Patient? No     Pain Assessment   Currently in Pain? No/denies           Exercise Prescription Changes - 04/28/17 1200      Response to Exercise   Blood Pressure (Admit) 116/70   Blood Pressure (Exercise) 146/84   Blood Pressure (Exit) 134/72   Heart Rate (Admit) 64 bpm   Heart Rate (Exercise) 98 bpm   Heart Rate (Exit) 68 bpm   Oxygen Saturation (Admit) 100 %   Oxygen Saturation (Exercise) 97 %   Oxygen Saturation (Exit) 97 %   Rating of Perceived Exertion (Exercise) 12   Perceived Dyspnea (Exercise) 0   Symptoms None   Comments walk test completed    Duration Progress to 45 minutes of aerobic exercise without signs/symptoms of physical distress   Intensity THRR New      History  Smoking Status  . Never Smoker  Smokeless Tobacco  . Never Used    Goals Met:  Exercise tolerated well Personal goals reviewed No report of cardiac concerns or symptoms  Goals Unmet:  Not Applicable  Comments: Medical review completed   Dr. Emily Filbert is Medical Director for [facility] and [facility].

## 2017-04-28 NOTE — Progress Notes (Signed)
Cardiac Individual Treatment Plan  Patient Details  Name: Miranda [name] MRN: [id number] Date of Birth: April 28, 1961 Referring Provider:     Cardiac Rehab from 04/28/2017 in [facility] Cardiac and Pulmonary Rehab  Referring Provider  Julien Nordmann MD      Initial Encounter Date:    Cardiac Rehab from 04/28/2017 in [facility] Cardiac and Pulmonary Rehab  Date  04/28/17  Referring Provider  Julien Nordmann MD      Visit Diagnosis: Cardiac arrest [facility])  Patient's Home Medications on Admission:  Current Outpatient Prescriptions:  .  aspirin EC 81 MG EC tablet, Take 1 tablet (81 mg total) by mouth daily., Disp: , Rfl:  .  carvedilol (COREG) 3.125 MG tablet, Take 1 tablet (3.125 mg total) by mouth 2 (two) times daily with a meal., Disp: 180 tablet, Rfl: 3 .  lipase/protease/amylase (CREON) 36000 UNITS CPEP capsule, Take 2 capsules (72,000 Units total) by mouth 3 (three) times daily before meals., Disp: 90 capsule, Rfl: 0 .  potassium chloride SA (K-DUR,KLOR-CON) 20 MEQ tablet, Take 1 tablet (20 mEq total) by mouth daily., Disp: 90 tablet, Rfl: 3 .  QUEtiapine (SEROQUEL) 25 MG tablet, Take 1 tablet (25 mg total) by mouth at bedtime., Disp: 30 tablet, Rfl: 0  Past Medical History: Past Medical History:  Diagnosis Date  . Chronic systolic CHF (congestive heart failure) (HCC)    a. TTE 02/26/2017: EF < 20%, diffuse HK, mild AI, RV sys fxn mildly reduced; b. TTE 03/03/2017: EF 55-60%, nl WM, mild AI, PASP 46  . Coronary artery disease, non-occlusive    a. LHC 02/25/2017: no angiographically significant CAD, EF <35%, moderately elevated LVEDP at 28 mmHg  . Dependent edema    bilateral legs  . Hot flashes   . HTN (hypertension)    controlled with meds;   . Migraines   . Nausea   . Ventricular fibrillation (HCC)    a. 02/25/2017: LHC without signficant CAD; b. idiopathic; c. possibly 2/2 diarrhea with electrolyte abnormalities with Imodium usage    Tobacco Use: History  Smoking Status  . Never  Smoker  Smokeless Tobacco  . Never Used    Labs: Recent Review Flowsheet Data    Labs for ITP Cardiac and Pulmonary Rehab Latest Ref Rng & Units 03/06/2017 03/07/2017 03/09/2017 03/10/2017 03/15/2017   Trlycerides <150 mg/dL 423(N) 361(W) - 431 -   Hemoglobin A1c 4.8 - 5.6 % - - - - 7.6(H)   PHART 7.350 - 7.450 7.48(H) - 7.51(H) - -   PCO2ART 32.0 - 48.0 mmHg 37 - 37 - -   HCO3 20.0 - 28.0 mmol/L 27.6 - 29.5(H) - -   ACIDBASEDEF 0.0 - 2.0 mmol/L - - - - -   O2SAT % 98.8 - 97.8 - -       Exercise Target Goals: Date: 04/28/17  Exercise Program Goal: Individual exercise prescription set with THRR, safety & activity barriers. Participant demonstrates ability to understand and report RPE using BORG scale, to self-measure pulse accurately, and to acknowledge the importance of the exercise prescription.  Exercise Prescription Goal: Starting with aerobic activity 30 plus minutes a day, 3 days per week for initial exercise prescription. Provide home exercise prescription and guidelines that participant acknowledges understanding prior to discharge.  Activity Barriers & Risk Stratification:     Activity Barriers & Cardiac Risk Stratification - 04/28/17 1244      Activity Barriers & Cardiac Risk Stratification   Activity Barriers Deconditioning;Muscular Weakness;Decreased Ventricular Function   Cardiac Risk Stratification High  6 Minute Walk:     6 Minute Walk    Row Name 04/28/17 1242         6 Minute Walk   Distance 1525 feet     Walk Time 6 minutes     # of Rest Breaks 0     MPH 2.8     METS 3.5     RPE 12     Perceived Dyspnea  0     VO2 Peak 12.59     Symptoms No     Resting HR 64 bpm     Resting BP 116/70     Resting Oxygen Saturation  100 %     Exercise Oxygen Saturation  during 6 min walk 97 %     Max Ex. HR 98 bpm     Max Ex. BP 146/84     2 Minute Post BP 134/72        Oxygen Initial Assessment:   Oxygen Re-Evaluation:   Oxygen Discharge (Final  Oxygen Re-Evaluation):   Initial Exercise Prescription:     Initial Exercise Prescription - 04/28/17 1200      Date of Initial Exercise RX and Referring Provider   Date 04/28/17   Referring Provider Julien Nordmann MD     Treadmill   MPH 2.8   Grade 1   Minutes 15   METs 3.5     NuStep   Level 2   SPM 80   Minutes 15   METs 3     REL-XR   Level 2   Watts 50   Speed 2.8   Minutes 15   METs 3     Prescription Details   Frequency (times per week) 2   Duration Progress to 45 minutes of aerobic exercise without signs/symptoms of physical distress     Intensity   THRR 40-80% of Max Heartrate 65-130   Ratings of Perceived Exertion 11-13   Perceived Dyspnea 0-4     Progression   Progression Continue progressive overload as per policy without signs/symptoms or physical distress.     Resistance Training   Training Prescription Yes   Weight 4   Reps 10-15      Perform Capillary Blood Glucose checks as needed.  Exercise Prescription Changes:     Exercise Prescription Changes    Row Name 04/28/17 1200             Response to Exercise   Blood Pressure (Admit) 116/70       Blood Pressure (Exercise) 146/84       Blood Pressure (Exit) 134/72       Heart Rate (Admit) 64 bpm       Heart Rate (Exercise) 98 bpm       Heart Rate (Exit) 68 bpm       Oxygen Saturation (Admit) 100 %       Oxygen Saturation (Exercise) 97 %       Oxygen Saturation (Exit) 97 %       Rating of Perceived Exertion (Exercise) 12       Perceived Dyspnea (Exercise) 0       Symptoms None       Comments walk test completed        Duration Progress to 45 minutes of aerobic exercise without signs/symptoms of physical distress       Intensity THRR New          Exercise Comments:   Exercise Goals and Review:  Exercise Goals    Row Name 04/28/17 1253             Exercise Goals   Increase Physical Activity Yes       Intervention Provide advice, education, support and  counseling about physical activity/exercise needs.;Develop an individualized exercise prescription for aerobic and resistive training based on initial evaluation findings, risk stratification, comorbidities and participant's personal goals.       Expected Outcomes Achievement of increased cardiorespiratory fitness and enhanced flexibility, muscular endurance and strength shown through measurements of functional capacity and personal statement of participant.       Increase Strength and Stamina Yes       Intervention Provide advice, education, support and counseling about physical activity/exercise needs.;Develop an individualized exercise prescription for aerobic and resistive training based on initial evaluation findings, risk stratification, comorbidities and participant's personal goals.       Expected Outcomes Achievement of increased cardiorespiratory fitness and enhanced flexibility, muscular endurance and strength shown through measurements of functional capacity and personal statement of participant.       Able to understand and use rate of perceived exertion (RPE) scale Yes       Intervention Provide education and explanation on how to use RPE scale       Expected Outcomes Short Term: Able to use RPE daily in rehab to express subjective intensity level;Long Term:  Able to use RPE to guide intensity level when exercising independently       Knowledge and understanding of Target Heart Rate Range (THRR) Yes       Intervention Provide education and explanation of THRR including how the numbers were predicted and where they are located for reference       Expected Outcomes Short Term: Able to state/look up THRR;Short Term: Able to use daily as guideline for intensity in rehab;Long Term: Able to use THRR to govern intensity when exercising independently       Able to check pulse independently Yes       Intervention Provide education and demonstration on how to check pulse in carotid and radial  arteries.;Review the importance of being able to check your own pulse for safety during independent exercise       Expected Outcomes Short Term: Able to explain why pulse checking is important during independent exercise;Long Term: Able to check pulse independently and accurately       Understanding of Exercise Prescription Yes       Intervention Provide education, explanation, and written materials on patient's individual exercise prescription       Expected Outcomes Short Term: Able to explain program exercise prescription;Long Term: Able to explain home exercise prescription to exercise independently          Exercise Goals Re-Evaluation :   Discharge Exercise Prescription (Final Exercise Prescription Changes):     Exercise Prescription Changes - 04/28/17 1200      Response to Exercise   Blood Pressure (Admit) 116/70   Blood Pressure (Exercise) 146/84   Blood Pressure (Exit) 134/72   Heart Rate (Admit) 64 bpm   Heart Rate (Exercise) 98 bpm   Heart Rate (Exit) 68 bpm   Oxygen Saturation (Admit) 100 %   Oxygen Saturation (Exercise) 97 %   Oxygen Saturation (Exit) 97 %   Rating of Perceived Exertion (Exercise) 12   Perceived Dyspnea (Exercise) 0   Symptoms None   Comments walk test completed    Duration Progress to 45 minutes of aerobic exercise without signs/symptoms of physical  distress   Intensity THRR New      Nutrition:  Target Goals: Understanding of nutrition guidelines, daily intake of sodium 1500mg , cholesterol 200mg , calories 30% from fat and 7% or less from saturated fats, daily to have 5 or more servings of fruits and vegetables.  Biometrics:     Pre Biometrics - 04/28/17 1254      Pre Biometrics   Height 5' 1.1" (1.552 m)   Weight 181 lb 1 oz (82.1 kg)   Waist Circumference 34.5 inches   Hip Circumference 44.5 inches   Waist to Hip Ratio 0.78 %   BMI (Calculated) 34.1   Single Leg Stand 7.08 seconds       Nutrition Therapy Plan and Nutrition  Goals:     Nutrition Therapy & Goals - 04/28/17 1204      Intervention Plan   Intervention Prescribe, educate and counsel regarding individualized specific dietary modifications aiming towards targeted core components such as weight, hypertension, lipid management, diabetes, heart failure and other comorbidities.   Expected Outcomes Short Term Goal: Understand basic principles of dietary content, such as calories, fat, sodium, cholesterol and nutrients.;Short Term Goal: A plan has been developed with personal nutrition goals set during dietitian appointment.;Long Term Goal: Adherence to prescribed nutrition plan.      Nutrition Discharge: Rate Your Plate Scores:     Nutrition Assessments - 04/28/17 1204      MEDFICTS Scores   Pre Score 3      Nutrition Goals Re-Evaluation:   Nutrition Goals Discharge (Final Nutrition Goals Re-Evaluation):   Psychosocial: Target Goals: Acknowledge presence or absence of significant depression and/or stress, maximize coping skills, provide positive support system. Participant is able to verbalize types and ability to use techniques and skills needed for reducing stress and depression.   Initial Review & Psychosocial Screening:     Initial Psych Review & Screening - 04/28/17 1206      Initial Review   Current issues with Current Sleep Concerns  Only sleeping 2-3 hours at a time. Has referral to MD for sleep study     Family Dynamics   Good Support System? Yes  Husband, son that lives at home, daughter and a sister lives in [place]     Barriers   Psychosocial barriers to participate in program There are no identifiable barriers or psychosocial needs.;The patient should benefit from training in stress management and relaxation.     Screening Interventions   Interventions Encouraged to exercise;Provide feedback about the scores to participant;To provide support and resources with identified psychosocial needs      Quality of Life Scores:       Quality of Life - 04/28/17 1211      Quality of Life Scores   Health/Function Pre 28.62 %   Socioeconomic Pre 30 %   Psych/Spiritual Pre 30 %   Family Pre 37.6 %   GLOBAL Pre 29.06 %      PHQ-9: Recent Review Flowsheet Data    Depression screen [facility] 04/28/2017   Decreased Interest 0   Down, Depressed, Hopeless 0   PHQ - 2 Score 0   Altered sleeping 0   Tired, decreased energy 0   Change in appetite 0   Feeling bad or failure about yourself  0   Trouble concentrating 0   Moving slowly or fidgety/restless 0   Suicidal thoughts 0   PHQ-9 Score 0   Difficult doing work/chores Not difficult at all     Interpretation of Total Score  Total Score Depression Severity:  1-4 = Minimal depression, 5-9 = Mild depression, 10-14 = Moderate depression, 15-19 = Moderately severe depression, 20-27 = Severe depression   Psychosocial Evaluation and Intervention:   Psychosocial Re-Evaluation:   Psychosocial Discharge (Final Psychosocial Re-Evaluation):   Vocational Rehabilitation: Provide vocational rehab assistance to qualifying candidates.   Vocational Rehab Evaluation & Intervention:     Vocational Rehab - 04/28/17 1213      Initial Vocational Rehab Evaluation & Intervention   Assessment shows need for Vocational Rehabilitation No      Education: Education Goals: Education classes will be provided on a variety of topics geared toward better understanding of heart health and risk factor modification. Participant will state understanding/return demonstration of topics presented as noted by education test scores.  Learning Barriers/Preferences:     Learning Barriers/Preferences - 04/28/17 1212      Learning Barriers/Preferences   Learning Barriers None   Learning Preferences Verbal Instruction      Education Topics: General Nutrition Guidelines/Fats and Fiber: -Group instruction provided by verbal, written material, models and posters to present the general  guidelines for heart healthy nutrition. Gives an explanation and review of dietary fats and fiber.   Controlling Sodium/Reading Food Labels: -Group verbal and written material supporting the discussion of sodium use in heart healthy nutrition. Review and explanation with models, verbal and written materials for utilization of the food label.   Exercise Physiology & Risk Factors: - Group verbal and written instruction with models to review the exercise physiology of the cardiovascular system and associated critical values. Details cardiovascular disease risk factors and the goals associated with each risk factor.   Aerobic Exercise & Resistance Training: - Gives group verbal and written discussion on the health impact of inactivity. On the components of aerobic and resistive training programs and the benefits of this training and how to safely progress through these programs.   Flexibility, Balance, General Exercise Guidelines: - Provides group verbal and written instruction on the benefits of flexibility and balance training programs. Provides general exercise guidelines with specific guidelines to those with heart or lung disease. Demonstration and skill practice provided.   Stress Management: - Provides group verbal and written instruction about the health risks of elevated stress, cause of high stress, and healthy ways to reduce stress.   Depression: - Provides group verbal and written instruction on the correlation between heart/lung disease and depressed mood, treatment options, and the stigmas associated with seeking treatment.   Anatomy & Physiology of the Heart: - Group verbal and written instruction and models provide basic cardiac anatomy and physiology, with the coronary electrical and arterial systems. Review of: AMI, Angina, Valve disease, Heart Failure, Cardiac Arrhythmia, Pacemakers, and the ICD.   Cardiac Procedures: - Group verbal and written instruction to review  commonly prescribed medications for heart disease. Reviews the medication, class of the drug, and side effects. Includes the steps to properly store meds and maintain the prescription regimen. (beta blockers and nitrates)   Cardiac Medications I: - Group verbal and written instruction to review commonly prescribed medications for heart disease. Reviews the medication, class of the drug, and side effects. Includes the steps to properly store meds and maintain the prescription regimen.   Cardiac Medications II: -Group verbal and written instruction to review commonly prescribed medications for heart disease. Reviews the medication, class of the drug, and side effects. (all other drug classes)    Go Sex-Intimacy & Heart Disease, Get SMART - Goal Setting: - Group verbal  and written instruction through game format to discuss heart disease and the return to sexual intimacy. Provides group verbal and written material to discuss and apply goal setting through the application of the S.M.A.R.T. Method.   Other Matters of the Heart: - Provides group verbal, written materials and models to describe Heart Failure, Angina, Valve Disease, Peripheral Artery Disease, and Diabetes in the realm of heart disease. Includes description of the disease process and treatment options available to the cardiac patient.   Exercise & Equipment Safety: - Individual verbal instruction and demonstration of equipment use and safety with use of the equipment.   [facility]  Date  04/28/17  Educator  SB  Instruction Review Code  1- Verbalizes Understanding      Infection Prevention: - Provides verbal and written material to individual with discussion of infection control including proper hand washing and proper equipment cleaning during exercise session.   [facility]  Date  04/28/17  Educator  SB  Instruction  Review Code  1- Verbalizes Understanding      Falls Prevention: - Provides verbal and written material to individual with discussion of falls prevention and safety.   [facility]  Date  04/28/17  Educator  SB  Instruction Review Code  1- Verbalizes Understanding      Diabetes: - Individual verbal and written instruction to review signs/symptoms of diabetes, desired ranges of glucose level fasting, after meals and with exercise. Acknowledge that pre and post exercise glucose checks will be done for 3 sessions at entry of program.   Other: -Provides group and verbal instruction on various topics (see comments)    Knowledge Questionnaire Score:     Knowledge Questionnaire Score - 04/28/17 1212      Knowledge Questionnaire Score   Pre Score 25/28  Reviewed corrcet responses with Lawson FiscalLori. She verbalized understanding of the response and had no further questions today.      Core Components/Risk Factors/Patient Goals at Admission:     Personal Goals and Risk Factors at Admission - 04/28/17 1205      Core Components/Risk Factors/Patient Goals on Admission    Weight Management Obesity;Weight Loss;Yes   Intervention Weight Management: Develop a combined nutrition and exercise program designed to reach desired caloric intake, while maintaining appropriate intake of nutrient and fiber, sodium and fats, and appropriate energy expenditure required for the weight goal.;Weight Management/Obesity: Establish reasonable short term and long term weight goals.   Admit Weight 181 lb (82.1 kg)  lost 30 pounds while in the hospital   Goal Weight: Short Term 178 lb (80.7 kg)   Goal Weight: Long Term 151 lb (68.5 kg)   Expected Outcomes Long Term: Adherence to nutrition and physical activity/exercise program aimed toward attainment of established weight goal;Weight Loss: Understanding of general recommendations for a balanced deficit meal plan,  which promotes 1-2 lb weight loss per week and includes a negative energy balance of [phone] kcal/d;Understanding of distribution of calorie intake throughout the day with the consumption of 4-5 meals/snacks;Understanding recommendations for meals to include 15-35% energy as protein, 25-35% energy from fat, 35-60% energy from carbohydrates, less than 200mg  of dietary cholesterol, 20-35 gm of total fiber daily   Hypertension Yes   Intervention Provide education on lifestyle modifcations including regular physical activity/exercise, weight management, moderate sodium restriction and increased consumption of fresh fruit, vegetables, and low fat dairy, alcohol  moderation, and smoking cessation.;Monitor prescription use compliance.   Expected Outcomes Short Term: Continued assessment and intervention until BP is < 140/50mm HG in hypertensive participants. < 130/28mm HG in hypertensive participants with diabetes, heart failure or chronic kidney disease.;Long Term: Maintenance of blood pressure at goal levels.      Core Components/Risk Factors/Patient Goals Review:    Core Components/Risk Factors/Patient Goals at Discharge (Final Review):    ITP Comments:     ITP Comments    Row Name 04/28/17 1202           ITP Comments  Medical review completed today.  Initial ITP created for review by Medical Director and changes as necessary.   Documentation of diagnosis can be found in [facility] Encounter 03/20/2017          Comments:

## 2017-04-28 NOTE — Progress Notes (Signed)
Cardiac Individual Treatment Plan  Patient Details  Name: Miranda [name] MRN: [id number] Date of Birth: April 28, 1961 Referring Provider:     Cardiac Rehab from 04/28/2017 in [facility] Cardiac and Pulmonary Rehab  Referring Provider  Julien Nordmann MD      Initial Encounter Date:    Cardiac Rehab from 04/28/2017 in [facility] Cardiac and Pulmonary Rehab  Date  04/28/17  Referring Provider  Julien Nordmann MD      Visit Diagnosis: Cardiac arrest [facility])  Patient's Home Medications on Admission:  Current Outpatient Prescriptions:  .  aspirin EC 81 MG EC tablet, Take 1 tablet (81 mg total) by mouth daily., Disp: , Rfl:  .  carvedilol (COREG) 3.125 MG tablet, Take 1 tablet (3.125 mg total) by mouth 2 (two) times daily with a meal., Disp: 180 tablet, Rfl: 3 .  lipase/protease/amylase (CREON) 36000 UNITS CPEP capsule, Take 2 capsules (72,000 Units total) by mouth 3 (three) times daily before meals., Disp: 90 capsule, Rfl: 0 .  potassium chloride SA (K-DUR,KLOR-CON) 20 MEQ tablet, Take 1 tablet (20 mEq total) by mouth daily., Disp: 90 tablet, Rfl: 3 .  QUEtiapine (SEROQUEL) 25 MG tablet, Take 1 tablet (25 mg total) by mouth at bedtime., Disp: 30 tablet, Rfl: 0  Past Medical History: Past Medical History:  Diagnosis Date  . Chronic systolic CHF (congestive heart failure) (HCC)    a. TTE 02/26/2017: EF < 20%, diffuse HK, mild AI, RV sys fxn mildly reduced; b. TTE 03/03/2017: EF 55-60%, nl WM, mild AI, PASP 46  . Coronary artery disease, non-occlusive    a. LHC 02/25/2017: no angiographically significant CAD, EF <35%, moderately elevated LVEDP at 28 mmHg  . Dependent edema    bilateral legs  . Hot flashes   . HTN (hypertension)    controlled with meds;   . Migraines   . Nausea   . Ventricular fibrillation (HCC)    a. 02/25/2017: LHC without signficant CAD; b. idiopathic; c. possibly 2/2 diarrhea with electrolyte abnormalities with Imodium usage    Tobacco Use: History  Smoking Status  . Never  Smoker  Smokeless Tobacco  . Never Used    Labs: Recent Review Flowsheet Data    Labs for ITP Cardiac and Pulmonary Rehab Latest Ref Rng & Units 03/06/2017 03/07/2017 03/09/2017 03/10/2017 03/15/2017   Trlycerides <150 mg/dL 423(N) 361(W) - 431 -   Hemoglobin A1c 4.8 - 5.6 % - - - - 7.6(H)   PHART 7.350 - 7.450 7.48(H) - 7.51(H) - -   PCO2ART 32.0 - 48.0 mmHg 37 - 37 - -   HCO3 20.0 - 28.0 mmol/L 27.6 - 29.5(H) - -   ACIDBASEDEF 0.0 - 2.0 mmol/L - - - - -   O2SAT % 98.8 - 97.8 - -       Exercise Target Goals: Date: 04/28/17  Exercise Program Goal: Individual exercise prescription set with THRR, safety & activity barriers. Participant demonstrates ability to understand and report RPE using BORG scale, to self-measure pulse accurately, and to acknowledge the importance of the exercise prescription.  Exercise Prescription Goal: Starting with aerobic activity 30 plus minutes a day, 3 days per week for initial exercise prescription. Provide home exercise prescription and guidelines that participant acknowledges understanding prior to discharge.  Activity Barriers & Risk Stratification:     Activity Barriers & Cardiac Risk Stratification - 04/28/17 1244      Activity Barriers & Cardiac Risk Stratification   Activity Barriers Deconditioning;Muscular Weakness;Decreased Ventricular Function   Cardiac Risk Stratification High  6 Minute Walk:     6 Minute Walk    Row Name 04/28/17 1242         6 Minute Walk   Distance 1525 feet     Walk Time 6 minutes     # of Rest Breaks 0     MPH 2.8     METS 3.5     RPE 12     Perceived Dyspnea  0     VO2 Peak 12.59     Symptoms No     Resting HR 64 bpm     Resting BP 116/70     Resting Oxygen Saturation  100 %     Exercise Oxygen Saturation  during 6 min walk 97 %     Max Ex. HR 98 bpm     Max Ex. BP 146/84     2 Minute Post BP 134/72        Oxygen Initial Assessment:   Oxygen Re-Evaluation:   Oxygen Discharge (Final  Oxygen Re-Evaluation):   Initial Exercise Prescription:     Initial Exercise Prescription - 04/28/17 1200      Date of Initial Exercise RX and Referring Provider   Date 04/28/17   Referring Provider Julien Nordmann MD     Treadmill   MPH 2.8   Grade 1   Minutes 15   METs 3.5     NuStep   Level 2   SPM 80   Minutes 15   METs 3     REL-XR   Level 2   Watts 50   Speed 2.8   Minutes 15   METs 3     Prescription Details   Frequency (times per week) 2   Duration Progress to 45 minutes of aerobic exercise without signs/symptoms of physical distress     Intensity   THRR 40-80% of Max Heartrate 65-130   Ratings of Perceived Exertion 11-13   Perceived Dyspnea 0-4     Progression   Progression Continue progressive overload as per policy without signs/symptoms or physical distress.     Resistance Training   Training Prescription Yes   Weight 4   Reps 10-15      Perform Capillary Blood Glucose checks as needed.  Exercise Prescription Changes:     Exercise Prescription Changes    Row Name 04/28/17 1200             Response to Exercise   Blood Pressure (Admit) 116/70       Blood Pressure (Exercise) 146/84       Blood Pressure (Exit) 134/72       Heart Rate (Admit) 64 bpm       Heart Rate (Exercise) 98 bpm       Heart Rate (Exit) 68 bpm       Oxygen Saturation (Admit) 100 %       Oxygen Saturation (Exercise) 97 %       Oxygen Saturation (Exit) 97 %       Rating of Perceived Exertion (Exercise) 12       Perceived Dyspnea (Exercise) 0       Symptoms None       Comments walk test completed        Duration Progress to 45 minutes of aerobic exercise without signs/symptoms of physical distress       Intensity THRR New          Exercise Comments:   Exercise Goals and Review:  Exercise Goals    Row Name 04/28/17 1253             Exercise Goals   Increase Physical Activity Yes       Intervention Provide advice, education, support and  counseling about physical activity/exercise needs.;Develop an individualized exercise prescription for aerobic and resistive training based on initial evaluation findings, risk stratification, comorbidities and participant's personal goals.       Expected Outcomes Achievement of increased cardiorespiratory fitness and enhanced flexibility, muscular endurance and strength shown through measurements of functional capacity and personal statement of participant.       Increase Strength and Stamina Yes       Intervention Provide advice, education, support and counseling about physical activity/exercise needs.;Develop an individualized exercise prescription for aerobic and resistive training based on initial evaluation findings, risk stratification, comorbidities and participant's personal goals.       Expected Outcomes Achievement of increased cardiorespiratory fitness and enhanced flexibility, muscular endurance and strength shown through measurements of functional capacity and personal statement of participant.       Able to understand and use rate of perceived exertion (RPE) scale Yes       Intervention Provide education and explanation on how to use RPE scale       Expected Outcomes Short Term: Able to use RPE daily in rehab to express subjective intensity level;Long Term:  Able to use RPE to guide intensity level when exercising independently       Knowledge and understanding of Target Heart Rate Range (THRR) Yes       Intervention Provide education and explanation of THRR including how the numbers were predicted and where they are located for reference       Expected Outcomes Short Term: Able to state/look up THRR;Short Term: Able to use daily as guideline for intensity in rehab;Long Term: Able to use THRR to govern intensity when exercising independently       Able to check pulse independently Yes       Intervention Provide education and demonstration on how to check pulse in carotid and radial  arteries.;Review the importance of being able to check your own pulse for safety during independent exercise       Expected Outcomes Short Term: Able to explain why pulse checking is important during independent exercise;Long Term: Able to check pulse independently and accurately       Understanding of Exercise Prescription Yes       Intervention Provide education, explanation, and written materials on patient's individual exercise prescription       Expected Outcomes Short Term: Able to explain program exercise prescription;Long Term: Able to explain home exercise prescription to exercise independently          Exercise Goals Re-Evaluation :   Discharge Exercise Prescription (Final Exercise Prescription Changes):     Exercise Prescription Changes - 04/28/17 1200      Response to Exercise   Blood Pressure (Admit) 116/70   Blood Pressure (Exercise) 146/84   Blood Pressure (Exit) 134/72   Heart Rate (Admit) 64 bpm   Heart Rate (Exercise) 98 bpm   Heart Rate (Exit) 68 bpm   Oxygen Saturation (Admit) 100 %   Oxygen Saturation (Exercise) 97 %   Oxygen Saturation (Exit) 97 %   Rating of Perceived Exertion (Exercise) 12   Perceived Dyspnea (Exercise) 0   Symptoms None   Comments walk test completed    Duration Progress to 45 minutes of aerobic exercise without signs/symptoms of physical  distress   Intensity THRR New      Nutrition:  Target Goals: Understanding of nutrition guidelines, daily intake of sodium 1500mg , cholesterol 200mg , calories 30% from fat and 7% or less from saturated fats, daily to have 5 or more servings of fruits and vegetables.  Biometrics:     Pre Biometrics - 04/28/17 1254      Pre Biometrics   Height 5' 1.1" (1.552 m)   Weight 181 lb 1 oz (82.1 kg)   Waist Circumference 34.5 inches   Hip Circumference 44.5 inches   Waist to Hip Ratio 0.78 %   BMI (Calculated) 34.1   Single Leg Stand 7.08 seconds       Nutrition Therapy Plan and Nutrition  Goals:     Nutrition Therapy & Goals - 04/28/17 1204      Intervention Plan   Intervention Prescribe, educate and counsel regarding individualized specific dietary modifications aiming towards targeted core components such as weight, hypertension, lipid management, diabetes, heart failure and other comorbidities.   Expected Outcomes Short Term Goal: Understand basic principles of dietary content, such as calories, fat, sodium, cholesterol and nutrients.;Short Term Goal: A plan has been developed with personal nutrition goals set during dietitian appointment.;Long Term Goal: Adherence to prescribed nutrition plan.      Nutrition Discharge: Rate Your Plate Scores:     Nutrition Assessments - 04/28/17 1204      MEDFICTS Scores   Pre Score 3      Nutrition Goals Re-Evaluation:   Nutrition Goals Discharge (Final Nutrition Goals Re-Evaluation):   Psychosocial: Target Goals: Acknowledge presence or absence of significant depression and/or stress, maximize coping skills, provide positive support system. Participant is able to verbalize types and ability to use techniques and skills needed for reducing stress and depression.   Initial Review & Psychosocial Screening:     Initial Psych Review & Screening - 04/28/17 1206      Initial Review   Current issues with Current Sleep Concerns  Only sleeping 2-3 hours at a time. Has referral to MD for sleep study     Family Dynamics   Good Support System? Yes  Husband, son that lives at home, daughter and a sister lives in [place]     Barriers   Psychosocial barriers to participate in program There are no identifiable barriers or psychosocial needs.;The patient should benefit from training in stress management and relaxation.     Screening Interventions   Interventions Encouraged to exercise;Provide feedback about the scores to participant;To provide support and resources with identified psychosocial needs      Quality of Life Scores:       Quality of Life - 04/28/17 1211      Quality of Life Scores   Health/Function Pre 28.62 %   Socioeconomic Pre 30 %   Psych/Spiritual Pre 30 %   Family Pre 37.6 %   GLOBAL Pre 29.06 %      PHQ-9: Recent Review Flowsheet Data    Depression screen [facility] 04/28/2017   Decreased Interest 0   Down, Depressed, Hopeless 0   PHQ - 2 Score 0   Altered sleeping 0   Tired, decreased energy 0   Change in appetite 0   Feeling bad or failure about yourself  0   Trouble concentrating 0   Moving slowly or fidgety/restless 0   Suicidal thoughts 0   PHQ-9 Score 0   Difficult doing work/chores Not difficult at all     Interpretation of Total Score  Total Score Depression Severity:  1-4 = Minimal depression, 5-9 = Mild depression, 10-14 = Moderate depression, 15-19 = Moderately severe depression, 20-27 = Severe depression   Psychosocial Evaluation and Intervention:   Psychosocial Re-Evaluation:   Psychosocial Discharge (Final Psychosocial Re-Evaluation):   Vocational Rehabilitation: Provide vocational rehab assistance to qualifying candidates.   Vocational Rehab Evaluation & Intervention:     Vocational Rehab - 04/28/17 1213      Initial Vocational Rehab Evaluation & Intervention   Assessment shows need for Vocational Rehabilitation No      Education: Education Goals: Education classes will be provided on a variety of topics geared toward better understanding of heart health and risk factor modification. Participant will state understanding/return demonstration of topics presented as noted by education test scores.  Learning Barriers/Preferences:     Learning Barriers/Preferences - 04/28/17 1212      Learning Barriers/Preferences   Learning Barriers None   Learning Preferences Verbal Instruction      Education Topics: General Nutrition Guidelines/Fats and Fiber: -Group instruction provided by verbal, written material, models and posters to present the general  guidelines for heart healthy nutrition. Gives an explanation and review of dietary fats and fiber.   Controlling Sodium/Reading Food Labels: -Group verbal and written material supporting the discussion of sodium use in heart healthy nutrition. Review and explanation with models, verbal and written materials for utilization of the food label.   Exercise Physiology & Risk Factors: - Group verbal and written instruction with models to review the exercise physiology of the cardiovascular system and associated critical values. Details cardiovascular disease risk factors and the goals associated with each risk factor.   Aerobic Exercise & Resistance Training: - Gives group verbal and written discussion on the health impact of inactivity. On the components of aerobic and resistive training programs and the benefits of this training and how to safely progress through these programs.   Flexibility, Balance, General Exercise Guidelines: - Provides group verbal and written instruction on the benefits of flexibility and balance training programs. Provides general exercise guidelines with specific guidelines to those with heart or lung disease. Demonstration and skill practice provided.   Stress Management: - Provides group verbal and written instruction about the health risks of elevated stress, cause of high stress, and healthy ways to reduce stress.   Depression: - Provides group verbal and written instruction on the correlation between heart/lung disease and depressed mood, treatment options, and the stigmas associated with seeking treatment.   Anatomy & Physiology of the Heart: - Group verbal and written instruction and models provide basic cardiac anatomy and physiology, with the coronary electrical and arterial systems. Review of: AMI, Angina, Valve disease, Heart Failure, Cardiac Arrhythmia, Pacemakers, and the ICD.   Cardiac Procedures: - Group verbal and written instruction to review  commonly prescribed medications for heart disease. Reviews the medication, class of the drug, and side effects. Includes the steps to properly store meds and maintain the prescription regimen. (beta blockers and nitrates)   Cardiac Medications I: - Group verbal and written instruction to review commonly prescribed medications for heart disease. Reviews the medication, class of the drug, and side effects. Includes the steps to properly store meds and maintain the prescription regimen.   Cardiac Medications II: -Group verbal and written instruction to review commonly prescribed medications for heart disease. Reviews the medication, class of the drug, and side effects. (all other drug classes)    Go Sex-Intimacy & Heart Disease, Get SMART - Goal Setting: - Group verbal  and written instruction through game format to discuss heart disease and the return to sexual intimacy. Provides group verbal and written material to discuss and apply goal setting through the application of the S.M.A.R.T. Method.   Other Matters of the Heart: - Provides group verbal, written materials and models to describe Heart Failure, Angina, Valve Disease, Peripheral Artery Disease, and Diabetes in the realm of heart disease. Includes description of the disease process and treatment options available to the cardiac patient.   Exercise & Equipment Safety: - Individual verbal instruction and demonstration of equipment use and safety with use of the equipment.   [facility]  Date  04/28/17  Educator  SB  Instruction Review Code  1- Verbalizes Understanding      Infection Prevention: - Provides verbal and written material to individual with discussion of infection control including proper hand washing and proper equipment cleaning during exercise session.   [facility]  Date  04/28/17  Educator  SB  Instruction  Review Code  1- Verbalizes Understanding      Falls Prevention: - Provides verbal and written material to individual with discussion of falls prevention and safety.   [facility]  Date  04/28/17  Educator  SB  Instruction Review Code  1- Verbalizes Understanding      Diabetes: - Individual verbal and written instruction to review signs/symptoms of diabetes, desired ranges of glucose level fasting, after meals and with exercise. Acknowledge that pre and post exercise glucose checks will be done for 3 sessions at entry of program.   Other: -Provides group and verbal instruction on various topics (see comments)    Knowledge Questionnaire Score:     Knowledge Questionnaire Score - 04/28/17 1212      Knowledge Questionnaire Score   Pre Score 25/28  Reviewed corrcet responses with Lawson FiscalLori. She verbalized understanding of the response and had no further questions today.      Core Components/Risk Factors/Patient Goals at Admission:     Personal Goals and Risk Factors at Admission - 04/28/17 1205      Core Components/Risk Factors/Patient Goals on Admission    Weight Management Obesity;Weight Loss;Yes   Intervention Weight Management: Develop a combined nutrition and exercise program designed to reach desired caloric intake, while maintaining appropriate intake of nutrient and fiber, sodium and fats, and appropriate energy expenditure required for the weight goal.;Weight Management/Obesity: Establish reasonable short term and long term weight goals.   Admit Weight 181 lb (82.1 kg)  lost 30 pounds while in the hospital   Goal Weight: Short Term 178 lb (80.7 kg)   Goal Weight: Long Term 151 lb (68.5 kg)   Expected Outcomes Long Term: Adherence to nutrition and physical activity/exercise program aimed toward attainment of established weight goal;Weight Loss: Understanding of general recommendations for a balanced deficit meal plan,  which promotes 1-2 lb weight loss per week and includes a negative energy balance of [phone] kcal/d;Understanding of distribution of calorie intake throughout the day with the consumption of 4-5 meals/snacks;Understanding recommendations for meals to include 15-35% energy as protein, 25-35% energy from fat, 35-60% energy from carbohydrates, less than 200mg  of dietary cholesterol, 20-35 gm of total fiber daily   Hypertension Yes   Intervention Provide education on lifestyle modifcations including regular physical activity/exercise, weight management, moderate sodium restriction and increased consumption of fresh fruit, vegetables, and low fat dairy, alcohol  moderation, and smoking cessation.;Monitor prescription use compliance.   Expected Outcomes Short Term: Continued assessment and intervention until BP is < 140/8mm HG in hypertensive participants. < 130/38mm HG in hypertensive participants with diabetes, heart failure or chronic kidney disease.;Long Term: Maintenance of blood pressure at goal levels.      Core Components/Risk Factors/Patient Goals Review:    Core Components/Risk Factors/Patient Goals at Discharge (Final Review):    ITP Comments:     ITP Comments    Row Name 04/28/17 1202           ITP Comments  Medical review completed today.  Initial ITP created for review by Medical Director and changes as necessary.   Documentation of diagnosis [facility] be found in [facility] Encounter [facility]/20/2017          Comments: initial ITP

## 2017-04-29 ENCOUNTER — Encounter: Payer: Self-pay | Admitting: Nurse Practitioner

## 2017-04-29 ENCOUNTER — Ambulatory Visit (INDEPENDENT_AMBULATORY_CARE_PROVIDER_SITE_OTHER): Payer: BC Managed Care – PPO | Admitting: Nurse Practitioner

## 2017-04-29 VITALS — BP 126/84 | HR 65 | Ht 60.0 in | Wt 179.8 lb

## 2017-04-29 DIAGNOSIS — E876 Hypokalemia: Secondary | ICD-10-CM

## 2017-04-29 DIAGNOSIS — I10 Essential (primary) hypertension: Secondary | ICD-10-CM | POA: Diagnosis not present

## 2017-04-29 DIAGNOSIS — I4901 Ventricular fibrillation: Secondary | ICD-10-CM

## 2017-04-29 DIAGNOSIS — I469 Cardiac arrest, cause unspecified: Secondary | ICD-10-CM | POA: Diagnosis not present

## 2017-04-29 NOTE — Patient Instructions (Signed)
Medication Instructions:  Please continue your current medications  Labwork: BMET  Testing/Procedures: None  Follow-Up: 3 months with Dr. Mariah Milling  If you need a refill on your cardiac medications before your next appointment, please call your pharmacy.

## 2017-04-29 NOTE — Progress Notes (Signed)
Office Visit    Patient Name: Miranda [name] Date of Encounter: 04/29/2017  Primary Care Provider:  Judeen HammansSoles, Meredith Key, MD Primary Cardiologist:  Concha Se. Gollan, MD / S. Graciela HusbandsKlein, MD   Chief Complaint    56 year old female status post recent VF arrest in the setting of hypokalemia and diarrhea with finding of nonobstructive CAD, eventual normalization of LV function, and ICD placement; hypertension, irritable bowel syndrome, who presents for follow-up.  Past Medical History    Past Medical History:  Diagnosis Date  . Coronary artery disease, non-occlusive    a. LHC 02/25/2017: no angiographically significant CAD, EF <35%, moderately elevated LVEDP at 28 mmHg.  Marland Kitchen. Dependent edema    bilateral legs  . Hot flashes   . HTN (hypertension)   . Hypokalemia   . IBS (irritable bowel syndrome)   . Idiopathic Ventricular fibrillation (HCC)    a. 02/25/2017: LHC without signficant CAD; b. possibly 2/2 diarrhea with electrolyte abnormalities/Imodium usage - K 2.7; c. s/p Medtronic MRI compatible dual chamber AICD, serial number [id number] H.  . Migraines   . Nausea   . NICM w/ recovery of LV dysfxn    a. TTE 02/26/2017: EF < 20%, diffuse HK, mild AI, RV sys fxn mildly reduced; b. TTE 03/03/2017: EF 55-60%, nl WM, mild AI, PASP 46   Past Surgical History:  Procedure Laterality Date  . APPENDECTOMY  1974  . CHOLECYSTECTOMY  1984  . ICD IMPLANT N/A 03/17/2017   Procedure: ICD Implant;  Surgeon: Duke SalviaKlein, Steven C, MD;  [facility];  Service: Cardiovascular;  Laterality: N/A;  . LEFT HEART CATH AND CORONARY ANGIOGRAPHY N/A 02/26/2017   Procedure: LEFT HEART CATH AND CORONARY ANGIOGRAPHY;  Surgeon: Yvonne KendallEnd, Yazan Gatling, MD;  Location: [facility] INVASIVE CV LAB;  Service: Cardiovascular;  Laterality: N/A;  . TONSILLECTOMY  1964    Allergies  Allergies  Allergen Reactions  . Ace Inhibitors   . Compazine [Prochlorperazine Edisylate]   . Lisinopril   . Penicillins     Has patient had a PCN  reaction causing immediate rash, facial/tongue/throat swelling, SOB or lightheadedness with hypotension: Unknown Has patient had a PCN reaction causing severe rash involving mucus membranes or skin necrosis: Unknown Has patient had a PCN reaction that required hospitalization: Unknown Has patient had a PCN reaction occurring within the last 10 years: Unknown If all of the above answers are "NO", then may proceed with Cephalosporin use.     History of Present Illness    56 year old female with the above complex past medical history including hypertension, migraines, and chronic diarrhea in the setting of irritable bowel. She was admitted to [facility] in late August following ventricular fibrillation arrest at home requiring hypothermia protocol, intubation, and vasopressor/inotropic therapy. Patient was found to be hypokalemic in the setting of diarrhea requiring multiple doses of Imodium. Emergent catheterization revealed minimal nonobstructive CAD. Initially, echo showed an EF of less than 20% but follow-up echo showed normalization of LV function. She was seen by electrophysiology and [facility] Cardiac MRI did not show any significant abnormalities. It was felt that VF was idiopathic and she therefore underwent placement of a Medtronic AICD. She was later discharged to inpatient rehabilitation and subsequently discharged home. Since discharged home, she has been doing remarkably well. She was receiving outpatient physical therapy but has been discharged. She is walking regularly without symptoms or limitations. Her left upper chest wall site has been healing well. She has had no presyncope or syncope. She denies PND,  orthopnea, dizziness, edema, or early satiety. She is eager to get back to work. She knows she cannot drive until March, and has not been driving.  Home Medications    Prior to Admission medications   Medication Sig Start Date End Date Taking? Authorizing Provider    aspirin EC 81 MG EC tablet Take 1 tablet (81 mg total) by mouth daily. 03/19/17  Yes Sheilah Pigeon, PA-C  carvedilol (COREG) 3.125 MG tablet Take 1 tablet (3.125 mg total) by mouth 2 (two) times daily with a meal. 04/10/17  Yes Duke Salvia, MD  lipase/protease/amylase (CREON) 36000 UNITS CPEP capsule Take 2 capsules (72,000 Units total) by mouth 3 (three) times daily before meals. 03/28/17  Yes Angiulli, Mcarthur Rossetti, PA-C  potassium chloride SA (K-DUR,KLOR-CON) 20 MEQ tablet Take 1 tablet (20 mEq total) by mouth daily. 04/10/17  Yes Duke Salvia, MD    Review of Systems    She denies chest pain, palpitations, dyspnea, pnd, orthopnea, n, v, dizziness, syncope, edema, weight gain, or early satiety.  All other systems reviewed and are otherwise negative except as noted above.  Physical Exam    VS:  BP 126/84 (BP Location: Left Arm, Patient Position: Sitting, Cuff Size: Normal)   Pulse 65   Ht 5' (1.524 m)   Wt 179 lb 12 oz (81.5 kg)   BMI 35.11 kg/m  , BMI Body mass index is 35.11 kg/m. GEN: Well nourished, well developed, in no acute distress.  HEENT: normal.  Neck: Supple, no JVD, carotid bruits, or masses. Cardiac: RRR, no murmurs, rubs, or gallops. No clubbing, cyanosis, edema.  Radials/DP/PT 2+ and equal bilaterally.  Respiratory:  Respirations regular and unlabored, clear to auscultation bilaterally. GI: Soft, nontender, nondistended, BS + x 4. MS: no deformity or atrophy. Skin: warm and dry, no rash. Neuro:  Strength and sensation are intact. Psych: Normal affect.  Accessory Clinical Findings    ECG - regular sinus rhythm, 65, no acute ST or T changes.  Lab Results  Component Value Date   CREATININE 0.52 03/28/2017   BUN 18 03/28/2017   NA 140 03/28/2017   K 3.6 03/28/2017   CL 111 03/28/2017   CO2 21 (L) 03/28/2017     Assessment & Plan    1.  Idiopathic ventricular fibrillation/cardiac arrest, subsequent episode of care: Patient was recently hospitalized  following ventricular fibrillation arrest in the setting of diarrhea and profound hypokalemia with potassium of 2.7. During hospitalization, she was found to have minimal nonobstructive CAD. EF was initially less than 20% with diffuse hypokinesis but subsequently recovered. Cardiac MRI was normal.  She subsequently underwent Medtronic AICD placement and has been doing well ever since. She was discharged from outpatient physical therapy. She has not been having any chest pain, dyspnea, presyncope, or syncope. She has been evaluated in device clinic and has follow-up with Dr. Graciela Husbands in December. 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 She understands that she cannot drive until 39/67/2897.  2. Essential hypertension: Blood pressure is stable on beta blocker therapy.  3. Hypokalemia:  last potassium on September 21 was 3.6. I will follow this up today. She remains on potassium supplementation.  4. Disposition: Patient will follow-up in electrophysiology clinic in early December. Follow-up with Dr. Mariah Milling in 3 months.   Nicolasa Ducking, NP 04/29/2017, 8:36 AM

## 2017-04-30 ENCOUNTER — Ambulatory Visit: Payer: BC Managed Care – PPO | Admitting: Physical Therapy

## 2017-04-30 DIAGNOSIS — I469 Cardiac arrest, cause unspecified: Secondary | ICD-10-CM

## 2017-04-30 LAB — BASIC METABOLIC PANEL
BUN/Creatinine Ratio: 16 (ref 9–23)
BUN: 11 mg/dL (ref 6–24)
CALCIUM: 9.7 mg/dL (ref 8.7–10.2)
CHLORIDE: 104 mmol/L (ref 96–106)
CO2: 22 mmol/L (ref 20–29)
Creatinine, Ser: 0.67 mg/dL (ref 0.57–1.00)
GFR calc Af Amer: 112 mL/min/{1.73_m2} (ref >59)
GFR, EST NON AFRICAN AMERICAN: 97 mL/min/{1.73_m2} (ref >59)
Glucose: 114 mg/dL — ABNORMAL HIGH (ref 65–99)
POTASSIUM: 4.4 mmol/L (ref 3.5–5.2)
Sodium: 142 mmol/L (ref 134–144)

## 2017-04-30 NOTE — Progress Notes (Signed)
Daily Session Note  Patient Details  Name: Miranda [name] MRN: [id number] Date of Birth: 1960/08/26 Referring Provider:     Cardiac Rehab from 04/28/2017 in [facility] Cardiac and Pulmonary Rehab  Referring Provider  Ida Rogue MD      Encounter Date: 04/30/2017  Check In:     Session Check In - 04/30/17 0740      Check-In   Location [facility]-Cardiac & Pulmonary Rehab   Staff Present Alberteen Sam, MA, ACSM RCEP, Exercise Physiologist;Susanne Bice, RN, BSN, CCRP;Cheryn Lundquist Flavia Shipper   Supervising physician immediately available to respond to emergencies See telemetry face sheet for immediately available ER MD   Medication changes reported     No   Fall or balance concerns reported    No   Warm-up and Cool-down Performed on first and last piece of equipment   Resistance Training Performed Yes   VAD Patient? No     Pain Assessment   Currently in Pain? No/denies   Multiple Pain Sites No         History  Smoking Status  . Never Smoker  Smokeless Tobacco  . Never Used    Goals Met:  Exercise tolerated well No report of cardiac concerns or symptoms Strength training completed today  Goals Unmet:  Not Applicable  Comments: First full day of exercise!  Patient was oriented to gym and equipment including functions, settings, policies, and procedures.  Patient's individual exercise prescription and treatment plan were reviewed.  All starting workloads were established based on the results of the 6 minute walk test done at initial orientation visit.  The plan for exercise progression was also introduced and progression will be customized based on patient's performance and goals.   Dr. Emily Filbert is Medical Director for [place] and [facility].

## 2017-05-01 ENCOUNTER — Inpatient Hospital Stay: Payer: BC Managed Care – PPO | Admitting: Physical Medicine & Rehabilitation

## 2017-05-02 DIAGNOSIS — I469 Cardiac arrest, cause unspecified: Secondary | ICD-10-CM

## 2017-05-02 NOTE — Progress Notes (Signed)
Daily Session Note  Patient Details  Name: Miranda [name] MRN: [id number] Date of Birth: 04-12-1961 Referring Provider:     Cardiac Rehab from 04/28/2017 in [facility] Cardiac and Pulmonary Rehab  Referring Provider  Ida Rogue MD      Encounter Date: 05/02/2017  Check In:     Session Check In - 05/02/17 0825      Check-In   Location [facility]-Cardiac & Pulmonary Rehab   Staff Present Alberteen Sam, MA, ACSM RCEP, Exercise Physiologist;Samaia Iwata Oletta Darter, BA, ACSM CEP, Exercise Physiologist;Meredith Sherryll Burger, RN BSN   Supervising physician immediately available to respond to emergencies See telemetry face sheet for immediately available ER MD   Medication changes reported     No   Fall or balance concerns reported    No   Warm-up and Cool-down Performed on first and last piece of equipment   Resistance Training Performed Yes   VAD Patient? No     Pain Assessment   Currently in Pain? No/denies         History  Smoking Status  . Never Smoker  Smokeless Tobacco  . Never Used    Goals Met:  Independence with exercise equipment Exercise tolerated well No report of cardiac concerns or symptoms Strength training completed today  Goals Unmet:  Not Applicable  Comments: Pt able to follow exercise prescription today without complaint.  Will continue to monitor for progression.    Dr. Emily Filbert is Medical Director for [place] and [facility].

## 2017-05-05 ENCOUNTER — Ambulatory Visit: Payer: BC Managed Care – PPO | Admitting: Physical Therapy

## 2017-05-06 ENCOUNTER — Ambulatory Visit: Payer: BC Managed Care – PPO | Attending: Internal Medicine

## 2017-05-06 DIAGNOSIS — R0683 Snoring: Secondary | ICD-10-CM | POA: Diagnosis not present

## 2017-05-06 DIAGNOSIS — G4710 Hypersomnia, unspecified: Secondary | ICD-10-CM | POA: Insufficient documentation

## 2017-05-07 ENCOUNTER — Ambulatory Visit: Payer: BC Managed Care – PPO | Admitting: Physical Therapy

## 2017-05-07 ENCOUNTER — Telehealth: Payer: Self-pay | Admitting: *Deleted

## 2017-05-07 DIAGNOSIS — I469 Cardiac arrest, cause unspecified: Secondary | ICD-10-CM

## 2017-05-07 DIAGNOSIS — I4901 Ventricular fibrillation: Principal | ICD-10-CM

## 2017-05-07 DIAGNOSIS — R570 Cardiogenic shock: Secondary | ICD-10-CM

## 2017-05-07 NOTE — Telephone Encounter (Signed)
-----   Message from Antonieta Iba, MD sent at 05/06/2017  7:17 PM EDT ----- Regarding: FW: Cardiac Rehab referral Can we please place referral thx TG  ----- Message ----- From: Rebbeca Paul, CMA Sent: 05/06/2017   4:55 PM To: Bryna Colander, RN, Antonieta Iba, MD Subject: Cardiac Rehab referral                         Pt has requested to be sent to Cardiac Rehab. Can you please make the referral. Per review with Dr. Graciela Husbands patient is a fine candidate for rehab.  Thanks

## 2017-05-07 NOTE — Telephone Encounter (Signed)
Referral for cardiac rehab placed and will make Miranda [name] CMA aware. Cardiac rehab will contact patient for scheduling.

## 2017-05-07 NOTE — Progress Notes (Signed)
Daily Session Note  Patient Details  Name: Mariyana Fulop MRN: [id number] Date of Birth: [birth year] Referring Provider:     Cardiac Rehab from 04/28/2017 in [facility] Cardiac and Pulmonary Rehab  Referring Provider  Ida Rogue MD      Encounter Date: 05/07/2017  Check In:     Session Check In - 05/07/17 0749      Check-In   Location [facility]-Cardiac & Pulmonary Rehab   Staff Present Gerlene Burdock, RN, Levie Heritage, MA, ACSM RCEP, Exercise Physiologist;Rielyn Krupinski Flavia Shipper   Supervising physician immediately available to respond to emergencies See telemetry face sheet for immediately available ER MD   Medication changes reported     No   Fall or balance concerns reported    No   Warm-up and Cool-down Performed on first and last piece of equipment   Resistance Training Performed Yes   VAD Patient? No     Pain Assessment   Currently in Pain? No/denies         History  Smoking Status  . Never Smoker  Smokeless Tobacco  . Never Used    Goals Met:  Independence with exercise equipment Exercise tolerated well No report of cardiac concerns or symptoms Strength training completed today  Goals Unmet:  Not Applicable  Comments: Pt able to follow exercise prescription today without complaint.  Will continue to monitor for progression.   Dr. Emily Filbert is Medical Director for [place] and [facility].

## 2017-05-09 ENCOUNTER — Encounter: Payer: BC Managed Care – PPO | Attending: Cardiovascular Disease

## 2017-05-09 ENCOUNTER — Telehealth: Payer: Self-pay | Admitting: *Deleted

## 2017-05-09 DIAGNOSIS — I2510 Atherosclerotic heart disease of native coronary artery without angina pectoris: Secondary | ICD-10-CM | POA: Diagnosis not present

## 2017-05-09 DIAGNOSIS — G4710 Hypersomnia, unspecified: Secondary | ICD-10-CM | POA: Diagnosis not present

## 2017-05-09 DIAGNOSIS — I469 Cardiac arrest, cause unspecified: Secondary | ICD-10-CM

## 2017-05-09 DIAGNOSIS — Z7982 Long term (current) use of aspirin: Secondary | ICD-10-CM | POA: Insufficient documentation

## 2017-05-09 DIAGNOSIS — Z79899 Other long term (current) drug therapy: Secondary | ICD-10-CM | POA: Insufficient documentation

## 2017-05-09 DIAGNOSIS — I110 Hypertensive heart disease with heart failure: Secondary | ICD-10-CM | POA: Insufficient documentation

## 2017-05-09 DIAGNOSIS — I5022 Chronic systolic (congestive) heart failure: Secondary | ICD-10-CM | POA: Insufficient documentation

## 2017-05-09 DIAGNOSIS — I4901 Ventricular fibrillation: Secondary | ICD-10-CM | POA: Insufficient documentation

## 2017-05-09 NOTE — Telephone Encounter (Signed)
Pt is negative for sleep apnea. Please advise on message below.

## 2017-05-09 NOTE — Progress Notes (Signed)
Daily Session Note  Patient Details  Name: Miranda [name] MRN: [id number] Date of Birth: Sep 28, 1960 Referring Provider:     Cardiac Rehab from 04/28/2017 in [facility] Cardiac and Pulmonary Rehab  Referring Provider  Ida Rogue MD      Encounter Date: 05/09/2017  Check In:     Session Check In - 05/09/17 0816      Check-In   Location [facility]-Cardiac & Pulmonary Rehab   Staff Present Heath Lark, RN, BSN, CCRP;Florance Paolillo Luan Pulling, MA, ACSM RCEP, Exercise Physiologist;Amanda Oletta Darter, BA, ACSM CEP, Exercise Physiologist   Supervising physician immediately available to respond to emergencies See telemetry face sheet for immediately available ER MD   Medication changes reported     No   Fall or balance concerns reported    No   Warm-up and Cool-down Performed on first and last piece of equipment   Resistance Training Performed Yes   VAD Patient? No     Pain Assessment   Currently in Pain? No/denies         History  Smoking Status  . Never Smoker  Smokeless Tobacco  . Never Used    Goals Met:  Independence with exercise equipment Exercise tolerated well No report of cardiac concerns or symptoms Strength training completed today  Goals Unmet:  Not Applicable  Comments: Pt able to follow exercise prescription today without complaint.  Will continue to monitor for progression.  Reviewed home exercise with pt today.  Pt plans to walk and go to gym in her apartment complex for exercise.  Reviewed THR, pulse, RPE, sign and symptoms, NTG use, and when to call 911 or MD.  Also discussed weather considerations and indoor options.  Pt voiced understanding.   Dr. Emily Filbert is Medical Director for [place] and [facility].

## 2017-05-09 NOTE — Telephone Encounter (Signed)
-----   Message from Shane Crutch, MD sent at 05/09/2017  2:04 PM EDT ----- Regarding: sleep study result Negative for sleep apnea.   Recommend: Follow with referring physician .If sleep apnea is still suspected, would recommend a home sleep study with patient in the supine position.

## 2017-05-12 NOTE — Telephone Encounter (Signed)
Pt states she is ok with in lab study being negative and doesn't want to do a HST. Nothing further needed.

## 2017-05-12 NOTE — Telephone Encounter (Signed)
LMOM for pt to return call. 

## 2017-05-12 NOTE — Telephone Encounter (Signed)
Please Proceed with recommendations

## 2017-05-14 ENCOUNTER — Encounter: Payer: Self-pay | Admitting: *Deleted

## 2017-05-14 ENCOUNTER — Encounter: Payer: Self-pay | Admitting: Gastroenterology

## 2017-05-14 ENCOUNTER — Ambulatory Visit: Payer: BC Managed Care – PPO | Admitting: Gastroenterology

## 2017-05-14 ENCOUNTER — Other Ambulatory Visit: Payer: Self-pay

## 2017-05-14 VITALS — BP 142/96 | HR 76 | Temp 98.2°F | Ht 60.0 in | Wt 179.6 lb

## 2017-05-14 DIAGNOSIS — I469 Cardiac arrest, cause unspecified: Secondary | ICD-10-CM | POA: Diagnosis not present

## 2017-05-14 DIAGNOSIS — Z1211 Encounter for screening for malignant neoplasm of colon: Secondary | ICD-10-CM | POA: Diagnosis not present

## 2017-05-14 DIAGNOSIS — R197 Diarrhea, unspecified: Secondary | ICD-10-CM | POA: Diagnosis not present

## 2017-05-14 MED ORDER — PANCRELIPASE (LIP-PROT-AMYL) 36000-114000 UNITS PO CPEP: 72000.0000 [IU] | ORAL_CAPSULE | Freq: Three times a day (TID) | ORAL | 3 refills | Status: DC

## 2017-05-14 NOTE — Addendum Note (Signed)
Addended by: Avie Arenas on: 05/14/2017 12:09 PM   Modules accepted: Orders, SmartSet

## 2017-05-14 NOTE — Progress Notes (Signed)
Daily Session Note  Patient Details  Name: Miranda [name] MRN: [id number] Date of Birth: Apr 13, 1961 Referring Provider:     Cardiac Rehab from 04/28/2017 in [facility] Cardiac and Pulmonary Rehab  Referring Provider  Ida Rogue MD      Encounter Date: 05/14/2017  Check In: Session Check In - 05/14/17 0741      Check-In   Location  [facility]-Cardiac & Pulmonary Rehab    Staff Present  Justin Mend RCP,RRT,BSRT;Heath Lark, RN, BSN, CCRP;Jessica Luan Pulling, MA, ACSM RCEP, Exercise Physiologist    Supervising physician immediately available to respond to emergencies  See telemetry face sheet for immediately available ER MD    Medication changes reported      No    Fall or balance concerns reported     No    Warm-up and Cool-down  Performed on first and last piece of equipment    Resistance Training Performed  Yes    VAD Patient?  No      Pain Assessment   Currently in Pain?  No/denies          Social History   Tobacco Use  Smoking Status Never Smoker  Smokeless Tobacco Never Used    Goals Met:  Independence with exercise equipment Exercise tolerated well No report of cardiac concerns or symptoms Strength training completed today  Goals Unmet:  Not Applicable  Comments: Pt able to follow exercise prescription today without complaint.  Will continue to monitor for progression.   Dr. Emily Filbert is Medical Director for [place] and [facility].

## 2017-05-14 NOTE — Progress Notes (Signed)
Cardiac Individual Treatment Plan  Patient Details  Name: Miranda [name] MRN: [id number] Date of Birth: 1960/10/16 Referring Provider:     Cardiac Rehab from 04/28/2017 in [facility] Cardiac and Pulmonary Rehab  Referring Provider  Ida Rogue MD      Initial Encounter Date:    Cardiac Rehab from 04/28/2017 in [facility] Cardiac and Pulmonary Rehab  Date  04/28/17  Referring Provider  Ida Rogue MD      Visit Diagnosis: Cardiac arrest [facility])  Patient's Home Medications on Admission:  Current Outpatient Medications:  .  aspirin EC 81 MG EC tablet, Take 1 tablet (81 mg total) by mouth daily., Disp: , Rfl:  .  carvedilol (COREG) 3.125 MG tablet, Take 1 tablet (3.125 mg total) by mouth 2 (two) times daily with a meal., Disp: 180 tablet, Rfl: 3 .  lipase/protease/amylase (CREON) 36000 UNITS CPEP capsule, Take 2 capsules (72,000 Units total) by mouth 3 (three) times daily before meals., Disp: 90 capsule, Rfl: 0 .  potassium chloride SA (K-DUR,KLOR-CON) 20 MEQ tablet, Take 1 tablet (20 mEq total) by mouth daily., Disp: 90 tablet, Rfl: 3  Past Medical History: Past Medical History:  Diagnosis Date  . Coronary artery disease, non-occlusive    a. LHC 02/25/2017: no angiographically significant CAD, EF <35%, moderately elevated LVEDP at 28 mmHg.  Marland Kitchen Dependent edema    bilateral legs  . Hot flashes   . HTN (hypertension)   . Hypokalemia   . IBS (irritable bowel syndrome)   . 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 02/25/2017: LHC without signficant CAD; b. possibly 2/2 diarrhea with electrolyte abnormalities/Imodium usage - K 2.7; c. s/p Medtronic MRI compatible dual chamber AICD, serial number [id number].  . Migraines   . Nausea   . NICM w/ recovery of LV dysfxn    a. TTE 02/26/2017: EF < 20%, diffuse HK, mild AI, RV sys fxn mildly reduced; b. TTE 03/03/2017: EF 55-60%, nl WM, mild AI, PASP 46    Tobacco Use: Social History   Tobacco Use  Smoking Status Never Smoker  Smokeless  Tobacco Never Used    Labs: Recent Review Flowsheet Data    Labs for ITP Cardiac and Pulmonary Rehab Latest Ref Rng & Units 03/06/2017 03/07/2017 03/09/2017 03/10/2017 03/15/2017   Trlycerides <150 mg/dL 329(H) 262(H) - 119 -   Hemoglobin A1c 4.8 - 5.6 % - - - - 7.6(H)   PHART 7.350 - 7.450 7.48(H) - 7.51(H) - -   PCO2ART 32.0 - 48.0 mmHg 37 - 37 - -   HCO3 20.0 - 28.0 mmol/L 27.6 - 29.5(H) - -   ACIDBASEDEF 0.0 - 2.0 mmol/L - - - - -   O2SAT % 98.8 - 97.8 - -       Exercise Target Goals:    Exercise Program Goal: Individual exercise prescription set with THRR, safety & activity barriers. Participant demonstrates ability to understand and report RPE using BORG scale, to self-measure pulse accurately, and to acknowledge the importance of the exercise prescription.  Exercise Prescription Goal: Starting with aerobic activity 30 plus minutes a day, 3 days per week for initial exercise prescription. Provide home exercise prescription and guidelines that participant acknowledges understanding prior to discharge.  Activity Barriers & Risk Stratification: Activity Barriers & Cardiac Risk Stratification - 04/28/17 1244      Activity Barriers & Cardiac Risk Stratification   Activity Barriers  Deconditioning;Muscular Weakness;Decreased Ventricular Function    Cardiac Risk Stratification  High       6 Minute  Walk: 6 Minute Walk    Row Name 04/28/17 1242         6 Minute Walk   Distance  1525 feet     Walk Time  6 minutes     # of Rest Breaks  0     MPH  2.8     METS  3.5     RPE  12     Perceived Dyspnea   0     VO2 Peak  12.59     Symptoms  No     Resting HR  64 bpm     Resting BP  116/70     Resting Oxygen Saturation   100 %     Exercise Oxygen Saturation  during 6 min walk  97 %     Max Ex. HR  98 bpm     Max Ex. BP  146/84     2 Minute Post BP  134/72        Oxygen Initial Assessment:   Oxygen Re-Evaluation:   Oxygen Discharge (Final Oxygen  Re-Evaluation):   Initial Exercise Prescription: Initial Exercise Prescription - 04/28/17 1200      Date of Initial Exercise RX and Referring Provider   Date  04/28/17    Referring Provider  Ida Rogue MD      Treadmill   MPH  2.8    Grade  1    Minutes  15    METs  3.5      NuStep   Level  2    SPM  80    Minutes  15    METs  3      REL-XR   Level  2    Watts  50    Speed  2.8    Minutes  15    METs  3      Prescription Details   Frequency (times per week)  2    Duration  Progress to 45 minutes of aerobic exercise without signs/symptoms of physical distress      Intensity   THRR 40-80% of Max Heartrate  65-130    Ratings of Perceived Exertion  11-13    Perceived Dyspnea  0-4      Progression   Progression  Continue progressive overload as per policy without signs/symptoms or physical distress.      Resistance Training   Training Prescription  Yes    Weight  4    Reps  10-15       Perform Capillary Blood Glucose checks as needed.  Exercise Prescription Changes: Exercise Prescription Changes    Row Name 04/28/17 1200 05/07/17 1500 05/09/17 0800         Response to Exercise   Blood Pressure (Admit)  116/70  124/70  -     Blood Pressure (Exercise)  146/84  130/76  -     Blood Pressure (Exit)  134/72  124/72  -     Heart Rate (Admit)  64 bpm  73 bpm  -     Heart Rate (Exercise)  98 bpm  104 bpm  -     Heart Rate (Exit)  68 bpm  85 bpm  -     Oxygen Saturation (Admit)  100 %  -  -     Oxygen Saturation (Exercise)  97 %  -  -     Oxygen Saturation (Exit)  97 %  -  -     Rating of Perceived Exertion (  Exercise)  12  12  -     Perceived Dyspnea (Exercise)  0  -  -     Symptoms  None  none  -     Comments  walk test completed   second full day of exercise  -     Duration  Progress to 45 minutes of aerobic exercise without signs/symptoms of physical distress  Progress to 45 minutes of aerobic exercise without signs/symptoms of physical distress  -      Intensity  THRR New  THRR unchanged  -       Progression   Progression  -  Continue to progress workloads to maintain intensity without signs/symptoms of physical distress.  -     Average METs  -  3.51  -       Resistance Training   Training Prescription  -  Yes  -     Weight  -  4 lbs  -     Reps  -  10-15  -       Interval Training   Interval Training  -  No  -       Treadmill   MPH  -  2.8  -     Grade  -  1  -     Minutes  -  15  -     METs  -  3.5  -       NuStep   Level  -  2  -     Minutes  -  15  -     METs  -  2.7  -       REL-XR   Level  -  2  -     Speed  -  50  -     Minutes  -  15  -     METs  -  4.3  -       Home Exercise Plan   Plans to continue exercise at  -  -  Home (comment) walking and gym in apartment complex     Frequency  -  -  Add 2 additional days to program exercise sessions.     Initial Home Exercises Provided  -  -  05/09/17        Exercise Comments: Exercise Comments    Row Name 04/30/17 0741           Exercise Comments  First full day of exercise!  Patient was oriented to gym and equipment including functions, settings, policies, and procedures.  Patient's individual exercise prescription and treatment plan were reviewed.  All starting workloads were established based on the results of the 6 minute walk test done at initial orientation visit.  The plan for exercise progression was also introduced and progression will be customized based on patient's performance and goals.          Exercise Goals and Review: Exercise Goals    Row Name 04/28/17 1253             Exercise Goals   Increase Physical Activity  Yes       Intervention  Provide advice, education, support and counseling about physical activity/exercise needs.;Develop an individualized exercise prescription for aerobic and resistive training based on initial evaluation findings, risk stratification, comorbidities and participant's personal goals.       Expected Outcomes   Achievement of increased cardiorespiratory fitness and enhanced flexibility, muscular endurance and strength shown through measurements of   functional capacity and personal statement of participant.       Increase Strength and Stamina  Yes       Intervention  Provide advice, education, support and counseling about physical activity/exercise needs.;Develop an individualized exercise prescription for aerobic and resistive training based on initial evaluation findings, risk stratification, comorbidities and participant's personal goals.       Expected Outcomes  Achievement of increased cardiorespiratory fitness and enhanced flexibility, muscular endurance and strength shown through measurements of functional capacity and personal statement of participant.       Able to understand and use rate of perceived exertion (RPE) scale  Yes       Intervention  Provide education and explanation on how to use RPE scale       Expected Outcomes  Short Term: Able to use RPE daily in rehab to express subjective intensity level;Long Term:  Able to use RPE to guide intensity level when exercising independently       Knowledge and understanding of Target Heart Rate Range (THRR)  Yes       Intervention  Provide education and explanation of THRR including how the numbers were predicted and where they are located for reference       Expected Outcomes  Short Term: Able to state/look up THRR;Short Term: Able to use daily as guideline for intensity in rehab;Long Term: Able to use THRR to govern intensity when exercising independently       Able to check pulse independently  Yes       Intervention  Provide education and demonstration on how to check pulse in carotid and radial arteries.;Review the importance of being able to check your own pulse for safety during independent exercise       Expected Outcomes  Short Term: Able to explain why pulse checking is important during independent exercise;Long Term: Able to check pulse  independently and accurately       Understanding of Exercise Prescription  Yes       Intervention  Provide education, explanation, and written materials on patient's individual exercise prescription       Expected Outcomes  Short Term: Able to explain program exercise prescription;Long Term: Able to explain home exercise prescription to exercise independently          Exercise Goals Re-Evaluation : Exercise Goals Re-Evaluation    Row Name 04/30/17 0741 05/07/17 1545 05/09/17 0818         Exercise Goal Re-Evaluation   Exercise Goals Review  Able to understand and use rate of perceived exertion (RPE) scale;Knowledge and understanding of Target Heart Rate Range (THRR);Understanding of Exercise Prescription  -  Increase Physical Activity;Able to understand and use Dyspnea scale;Understanding of Exercise Prescription;Increase Strength and Stamina;Knowledge and understanding of Target Heart Rate Range (THRR);Able to understand and use rate of perceived exertion (RPE) scale;Able to check pulse independently     Comments  Reviewed RPE scale, THR and program prescription with pt today.  Pt voiced understanding and was given a copy of goals to take home.   Luna is off to a good start in rehab.  She has completed two full days of exercise.  We will continue to monitor her progression.   Reviewed home exercise with pt today.  Pt plans to walk and go to gym in her apartment complex for exercise.  Reviewed THR, pulse, RPE, sign and symptoms, NTG use, and when to call 911 or MD.  Also discussed weather considerations and indoor options.  Pt voiced understanding.       Expected Outcomes  Short: Use RPE daily to regulate intensity.  Long: Follow program prescription in THR.  Short: Attend rehab regularly.  Long: Make exercise part of routine.   Short: Add in two extra days a week.  Long: Make exercise part of daily routine.         Discharge Exercise Prescription (Final Exercise Prescription Changes): Exercise  Prescription Changes - 05/09/17 0800      Home Exercise Plan   Plans to continue exercise at  Home (comment) walking and gym in apartment complex   walking and gym in apartment complex   Frequency  Add 2 additional days to program exercise sessions.    Initial Home Exercises Provided  05/09/17       Nutrition:  Target Goals: Understanding of nutrition guidelines, daily intake of sodium <1525m, cholesterol <2027m calories 30% from fat and 7% or less from saturated fats, daily to have 5 or more servings of fruits and vegetables.  Biometrics: Pre Biometrics - 04/28/17 1254      Pre Biometrics   Height  5' 1.1" (1.552 m)    Weight  181 lb 1 oz (82.1 kg)    Waist Circumference  34.5 inches    Hip Circumference  44.5 inches    Waist to Hip Ratio  0.78 %    BMI (Calculated)  34.1    Single Leg Stand  7.08 seconds        Nutrition Therapy Plan and Nutrition Goals: Nutrition Therapy & Goals - 04/28/17 1204      Intervention Plan   Intervention  Prescribe, educate and counsel regarding individualized specific dietary modifications aiming towards targeted core components such as weight, hypertension, lipid management, diabetes, heart failure and other comorbidities.    Expected Outcomes  Short Term Goal: Understand basic principles of dietary content, such as calories, fat, sodium, cholesterol and nutrients.;Short Term Goal: A plan has been developed with personal nutrition goals set during dietitian appointment.;Long Term Goal: Adherence to prescribed nutrition plan.       Nutrition Discharge: Rate Your Plate Scores: Nutrition Assessments - 04/28/17 1204      MEDFICTS Scores   Pre Score  3       Nutrition Goals Re-Evaluation:   Nutrition Goals Discharge (Final Nutrition Goals Re-Evaluation):   Psychosocial: Target Goals: Acknowledge presence or absence of significant depression and/or stress, maximize coping skills, provide positive support system. Participant is able to  verbalize types and ability to use techniques and skills needed for reducing stress and depression.   Initial Review & Psychosocial Screening: Initial Psych Review & Screening - 04/28/17 1206      Initial Review   Current issues with  Current Sleep Concerns Only sleeping 2-3 hours at a time. Has referral to MD for sleep study   Only sleeping 2-3 hours at a time. Has referral to MD for sleep study     Family Dynamics   Good Support System?  Yes Husband, son that lives at home, daughter [place] sister [place] Husband, son that lives at home, daughter and [place] sister [place] in [place]   Barriers   Psychosocial barriers to participate in program  There are no identifiable barriers or psychosocial needs.;The patient should benefit from training in stress management and relaxation.      Screening Interventions   Interventions  Encouraged to exercise;Provide feedback about the scores to participant;To provide support and resources with identified psychosocial needs       Quality  of Life Scores:  Quality of Life - 04/28/17 1211      Quality of Life Scores   Health/Function Pre  28.62 %    Socioeconomic Pre  30 %    Psych/Spiritual Pre  30 %    Family Pre  37.6 %    GLOBAL Pre  29.06 %       PHQ-9: Recent Review Flowsheet Data    Depression screen [facility] 2/9 04/28/2017   Decreased Interest 0   Down, Depressed, Hopeless 0   PHQ - 2 Score 0   Altered sleeping 0   Tired, decreased energy 0   Change in appetite 0   Feeling bad or failure about yourself  0   Trouble concentrating 0   Moving slowly or fidgety/restless 0   Suicidal thoughts 0   PHQ-9 Score 0   Difficult doing work/chores Not difficult at all     Interpretation of Total Score  Total Score Depression Severity:  1-4 = Minimal depression, 5-9 = Mild depression, 10-14 = Moderate depression, 15-19 = Moderately severe depression, 20-27 = Severe depression   Psychosocial Evaluation and Intervention: Psychosocial  Evaluation - 05/07/17 0935      Psychosocial Evaluation & Interventions   Interventions  Encouraged to exercise with the program and follow exercise prescription;Stress management education    Comments  Counselor met with Ms. Wille Glaser Cecille Rubin) today for initial psychosocial evaluation.  She is a 56 year old who went into cardiac arrest on 8/21.  She has a strong support system  with a spouse of 34 years and an adult son who lives in the home as well.  Tonga states she sleeps "horribly" and had a sleep study last night.  She reports having used essential oils in the past week which was helpful and she got almost 6 hours sleep those nights.  She will be researching this more.  Annlouise has a great appetite and denies a history of depression or anxiety.  She states her mood is typically very positive and she has minimal stress in her life other than her health and not being able to drive due to her condition until March, so her son is driving her back and forth to class and once she returns to work - will do the same.  Sonja has goals to increase her stamina and strength and to begin a habit of exercising.  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      Expected Outcomes  Ovetta will benefit from consistent exercise to achieve her stated goals.  The educational and psychoeducational components of this program will be helpful for her to learn more about her condition and positive ways to manage this in her life.  She will see the ENT soon for her voice concerns and will update staff on the outcome of her sleep study.      Continue Psychosocial Services   Follow up required by staff       Psychosocial Re-Evaluation:   Psychosocial Discharge (Final Psychosocial Re-Evaluation):   Vocational Rehabilitation: Provide vocational rehab assistance to qualifying candidates.   Vocational Rehab Evaluation &  Intervention: Vocational Rehab - 04/28/17 1213      Initial Vocational Rehab Evaluation & Intervention   Assessment shows need for Vocational Rehabilitation  No       Education: Education Goals: Education classes will be provided on  a variety of topics geared toward better understanding of heart health and risk factor modification. Participant will state understanding/return demonstration of topics presented as noted by education test scores.  Learning Barriers/Preferences: Learning Barriers/Preferences - 04/28/17 1212      Learning Barriers/Preferences   Learning Barriers  None    Learning Preferences  Verbal Instruction       Education Topics: General Nutrition Guidelines/Fats and Fiber: -Group instruction provided by verbal, written material, models and posters to present the general guidelines for heart healthy nutrition. Gives an explanation and review of dietary fats and fiber.   Controlling Sodium/Reading Food Labels: -Group verbal and written material supporting the discussion of sodium use in heart healthy nutrition. Review and explanation with models, verbal and written materials for utilization of the food label.   Exercise Physiology & Risk Factors: - Group verbal and written instruction with models to review the exercise physiology of the cardiovascular system and associated critical values. Details cardiovascular disease risk factors and the goals associated with each risk factor.   Aerobic Exercise & Resistance Training: - Gives group verbal and written discussion on the health impact of inactivity. On the components of aerobic and resistive training programs and the benefits of this training and how to safely progress through these programs.   Cardiac Rehab from 05/07/2017 in [facility] Cardiac and Pulmonary Rehab  Date  05/07/17  Educator  JH  Instruction Review Code  1- Verbalizes Understanding      Flexibility, Balance, General Exercise Guidelines: - Provides  group verbal and written instruction on the benefits of flexibility and balance training programs. Provides general exercise guidelines with specific guidelines to those with heart or lung disease. Demonstration and skill practice provided.   Stress Management: - Provides group verbal and written instruction about the health risks of elevated stress, cause of high stress, and healthy ways to reduce stress.   Depression: - Provides group verbal and written instruction on the correlation between heart/lung disease and depressed mood, treatment options, and the stigmas associated with seeking treatment.   Anatomy & Physiology of the Heart: - Group verbal and written instruction and models provide basic cardiac anatomy and physiology, with the coronary electrical and arterial systems. Review of: AMI, Angina, Valve disease, Heart Failure, Cardiac Arrhythmia, Pacemakers, and the ICD.   Cardiac Procedures: - Group verbal and written instruction to review commonly prescribed medications for heart disease. Reviews the medication, class of the drug, and side effects. Includes the steps to properly store meds and maintain the prescription regimen. (beta blockers and nitrates)   Cardiac Medications I: - Group verbal and written instruction to review commonly prescribed medications for heart disease. Reviews the medication, class of the drug, and side effects. Includes the steps to properly store meds and maintain the prescription regimen.   Cardiac Medications II: -Group verbal and written instruction to review commonly prescribed medications for heart disease. Reviews the medication, class of the drug, and side effects. (all other drug classes)    Go Sex-Intimacy & Heart Disease, Get SMART - Goal Setting: - Group verbal and written instruction through game format to discuss heart disease and the return to sexual intimacy. Provides group verbal and written material to discuss and apply goal setting  through the application of the S.M.A.R.T. Method.   Other Matters of the Heart: - Provides group verbal, written materials and models to describe Heart Failure, Angina, Valve Disease, Peripheral Artery Disease, and Diabetes in the realm of heart disease. Includes description of the disease   process and treatment options available to the cardiac patient.   Exercise & Equipment Safety: - Individual verbal instruction and demonstration of equipment use and safety with use of the equipment.   Cardiac Rehab from 05/07/2017 in [facility] Cardiac and Pulmonary Rehab  Date  04/28/17  Educator  SB  Instruction Review Code  1- Verbalizes Understanding      Infection Prevention: - Provides verbal and written material to individual with discussion of infection control including proper hand washing and proper equipment cleaning during exercise session.   Cardiac Rehab from 05/07/2017 in [facility] Cardiac and Pulmonary Rehab  Date  04/28/17  Educator  SB  Instruction Review Code  1- Verbalizes Understanding      Falls Prevention: - Provides verbal and written material to individual with discussion of falls prevention and safety.   Cardiac Rehab from 05/07/2017 in [facility] Cardiac and Pulmonary Rehab  Date  04/28/17  Educator  SB  Instruction Review Code  1- Verbalizes Understanding      Diabetes: - Individual verbal and written instruction to review signs/symptoms of diabetes, desired ranges of glucose level fasting, after meals and with exercise. Acknowledge that pre and post exercise glucose checks will be done for 3 sessions at entry of program.   Other: -Provides group and verbal instruction on various topics (see comments)    Knowledge Questionnaire Score: Knowledge Questionnaire Score - 04/28/17 1212      Knowledge Questionnaire Score   Pre Score  25/28 Reviewed corrcet responses with Cecille Rubin. She verbalized understanding of the response and had no further questions today.   Reviewed corrcet  responses with Cecille Rubin. She verbalized understanding of the response and had no further questions today.      Core Components/Risk Factors/Patient Goals at Admission: Personal Goals and Risk Factors at Admission - 04/28/17 1205      Core Components/Risk Factors/Patient Goals on Admission    Weight Management  Obesity;Weight Loss;Yes    Intervention  Weight Management: Develop a combined nutrition and exercise program designed to reach desired caloric intake, while maintaining appropriate intake of nutrient and fiber, sodium and fats, and appropriate energy expenditure required for the weight goal.;Weight Management/Obesity: Establish reasonable short term and long term weight goals.    Admit Weight  181 lb (82.1 kg) lost 30 pounds while in the hospital   lost 30 pounds while in the hospital   Goal Weight: Short Term  178 lb (80.7 kg)    Goal Weight: Long Term  151 lb (68.5 kg)    Expected Outcomes  Long Term: Adherence to nutrition and physical activity/exercise program aimed toward attainment of established weight goal;Weight Loss: Understanding of general recommendations for a balanced deficit meal plan, which promotes 1-2 lb weight loss per week and includes a negative energy balance of [phone] kcal/d;Understanding of distribution of calorie intake throughout the day with the consumption of 4-5 meals/snacks;Understanding recommendations for meals to include 15-35% energy as protein, 25-35% energy from fat, 35-60% energy from carbohydrates, less than 225m of dietary cholesterol, 20-35 gm of total fiber daily    Hypertension  Yes    Intervention  Provide education on lifestyle modifcations including regular physical activity/exercise, weight management, moderate sodium restriction and increased consumption of fresh fruit, vegetables, and low fat dairy, alcohol moderation, and smoking cessation.;Monitor prescription use compliance.    Expected Outcomes  Short Term: Continued assessment and  intervention until BP is < 140/988mHG in hypertensive participants. < 130/8080mG in hypertensive participants with diabetes, heart failure or chronic  kidney disease.;Long Term: Maintenance of blood pressure at goal levels.       Core Components/Risk Factors/Patient Goals Review:    Core Components/Risk Factors/Patient Goals at Discharge (Final Review):    ITP Comments: ITP Comments    Row Name 04/28/17 1202 05/14/17 0631         ITP Comments   Medical review completed today.  Initial ITP created for review by Medical Director and changes as necessary.   Documentation of diagnosis can be found in [facility] Encounter 03/20/2017  30 day review. Continue with ITP unless directed changes per Medical Director review.  New to program         Comments:

## 2017-05-14 NOTE — Progress Notes (Signed)
Miranda MoodKiran Luba Matzen MD, MRCP(U.K) [address]  [address]  [place], [place] [address]  Main: [phone]  Fax: [phone]   Primary Care Physician: Judeen HammansSoles, Meredith Key, MD  Primary Gastroenterologist:  Dr. Wyline MoodKiran Haruo Stepanek   Chief Complaint  Patient presents with  . Diarrhea    has improved with immodium and creon    HPI: Miranda LinkLori [name] is a 56 y.o. female   Summary of history: She recently had a cardiac arrest in 02/2017 and underwent an ICD placement. 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 She was subsequently intubated . Prior to admission was having diarrhea for a few days preceding her admission and was taking multiple tablets of imodium. EMS was called when she was unresponsive at home, EMS found her in V fibb , shocked , negative cardiac cath , was in shock , pressor support , extubated . Felt by cardiology that her  V fibb arrest was due to low electrolytes Oncology admission K was 2.9 CT-scan of the chest done 03/10/17 shows multifocal pneumonia .Admission HNP suggests she was on olmesartan and ibuprofen.At that time she had diarrhea for over 20 years . A  week prior to admission she was visiting [place] , ate a lot of fast food and right after that started having severe diarrhea which was non bloody and subsequently led to the admission. She had started Olmesartan within the last 6 months. She consumed a lot of sweetened tea and drinks at [employer] which has a lot of sugar natural and artificial. Last colonoscopy was >20 years back and EGD 6 years back, no family history of IBD or Celiac disease.Stool PCR,calpropectin,  Celiac serology was negative    Interval history   9//2018-  05/14/2017   Since she started the CREON at the hospital all diarrhea has stopped. Has stopped all processed foods and since has not had any issues. Her last colonoscopy >10 years back. Not on any blood thinners    Current Outpatient Medications  Medication Sig Dispense Refill  . aspirin  EC 81 MG EC tablet Take 1 tablet (81 mg total) by mouth daily.    . carvedilol (COREG) 3.125 MG tablet Take 1 tablet (3.125 mg total) by mouth 2 (two) times daily with a meal. 180 tablet 3  . lipase/protease/amylase (CREON) 36000 UNITS CPEP capsule Take 2 capsules (72,000 Units total) by mouth 3 (three) times daily before meals. 90 capsule 0  . potassium chloride SA (K-DUR,KLOR-CON) 20 MEQ tablet Take 1 tablet (20 mEq total) by mouth daily. 90 tablet 3   No current facility-administered medications for this visit.     Allergies as of 05/14/2017 - Review Complete 05/14/2017  Allergen Reaction Noted  . Ace inhibitors  08/13/2016  . Compazine [prochlorperazine edisylate]  08/13/2016  . Lisinopril  08/13/2016  . Penicillins  08/13/2016    ROS:  General: Negative for anorexia, weight loss, fever, chills, fatigue, weakness. ENT: Negative for hoarseness, difficulty swallowing , nasal congestion. CV: Negative for chest pain, angina, palpitations, dyspnea on exertion, peripheral edema.  Respiratory: Negative for dyspnea at rest, dyspnea on exertion, cough, sputum, wheezing.  GI: See history of present illness. GU:  Negative for dysuria, hematuria, urinary incontinence, urinary frequency, nocturnal urination.  Endo: Negative for unusual weight change.    Physical Examination:   BP (!) 142/96 (BP Location: Right Arm, Patient Position: Sitting, Cuff Size: Normal)   Pulse 76   Temp 98.2 F (36.8 C) (Oral)   Ht 5' (  1.524 m)   Wt 179 lb 9.6 oz (81.5 kg)   BMI 35.08 kg/m   General: Well-nourished, well-developed in no acute distress.  Eyes: No icterus. Conjunctivae pink. Mouth: Oropharyngeal mucosa moist and pink , no lesions erythema or exudate. Lungs: Clear to auscultation bilaterally. Non-labored. Heart: Regular rate and rhythm, no murmurs rubs or gallops.  Abdomen: Bowel sounds are normal, nontender, nondistended, no hepatosplenomegaly or masses, no abdominal bruits or hernia , no  rebound or guarding.   Extremities: No lower extremity edema. No clubbing or deformities. Neuro: Alert and oriented x 3.  Grossly intact. Skin: Warm and dry, no jaundice.   Psych: Alert and cooperative, normal [name] and affect.   Imaging Studies: No results found.  Assessment and Plan:   [name] is a 56 y.o. y/o female here to follow up for diarrhea. 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 She could have had the cardiac arrest from high doses of imodium which is a rare but known issue . She is being followed by cardiology.   1. Screening colonoscopy - average risk- will need cardiac clearance.  2. CREON refill   I have discussed alternative options, risks & benefits,  which include, but are not limited to, bleeding, infection, perforation,respiratory complication & drug reaction.  The patient agrees with this plan & written consent will be obtained.     Dr Miranda Mood  MD,MRCP [facility]) Follow up PRN

## 2017-05-15 ENCOUNTER — Encounter: Payer: Self-pay | Admitting: Dietician

## 2017-05-16 ENCOUNTER — Encounter: Payer: BC Managed Care – PPO | Admitting: *Deleted

## 2017-05-16 DIAGNOSIS — I469 Cardiac arrest, cause unspecified: Secondary | ICD-10-CM | POA: Diagnosis not present

## 2017-05-16 NOTE — Progress Notes (Signed)
Daily Session Note  Patient Details  Name: Miranda [name] MRN: [id number] Date of Birth: [birth year] Referring Provider:     Cardiac Rehab from 04/28/2017 in [facility] Cardiac and Pulmonary Rehab  Referring Provider  Ida Rogue MD      Encounter Date: 05/16/2017  Check In: Session Check In - 05/16/17 0803      Check-In   Location  [facility]-Cardiac & Pulmonary Rehab    Staff Present  Nada Maclachlan, BA, ACSM CEP, Exercise Physiologist;Carroll Enterkin, RN, Levie Heritage, MA, ACSM RCEP, Exercise Physiologist    Supervising physician immediately available to respond to emergencies  See telemetry face sheet for immediately available ER MD    Medication changes reported      No    Fall or balance concerns reported     No    Warm-up and Cool-down  Performed on first and last piece of equipment    Resistance Training Performed  Yes    VAD Patient?  No      Pain Assessment   Currently in Pain?  No/denies          Social History   Tobacco Use  Smoking Status Never Smoker  Smokeless Tobacco Never Used    Goals Met:  Independence with exercise equipment Exercise tolerated well No report of cardiac concerns or symptoms Strength training completed today  Goals Unmet:  Not Applicable  Comments: Pt able to follow exercise prescription today without complaint.  Will continue to monitor for progression.    Dr. Emily Filbert is Medical Director for [facility] and [facility].

## 2017-05-21 ENCOUNTER — Encounter: Payer: Self-pay | Admitting: *Deleted

## 2017-05-21 ENCOUNTER — Ambulatory Visit: Payer: BC Managed Care – PPO | Attending: Unknown Physician Specialty | Admitting: Speech Pathology

## 2017-05-21 ENCOUNTER — Encounter: Payer: Self-pay | Admitting: Speech Pathology

## 2017-05-21 ENCOUNTER — Other Ambulatory Visit: Payer: Self-pay

## 2017-05-21 DIAGNOSIS — R490 Dysphonia: Secondary | ICD-10-CM | POA: Diagnosis present

## 2017-05-21 DIAGNOSIS — I469 Cardiac arrest, cause unspecified: Secondary | ICD-10-CM | POA: Diagnosis not present

## 2017-05-21 NOTE — Therapy (Signed)
[facility] MAIN [facility] SERVICES [address] [place], [place], [address] Phone: [phone]   Fax:  [phone]  Speech Language Pathology Evaluation  Patient Details  Name: Laneice Shrewsbury MRN: [id number] Date of Birth: 1961-05-10 Referring Provider: Dr. Jenne Campus   Encounter Date: 05/21/2017  End of Session - 05/21/17 1233    Visit Number  1    Number of Visits  17    Date for SLP Re-Evaluation  07/21/17    SLP Start Time  1100    SLP Stop Time   1150    SLP Time Calculation (min)  50 min    Activity Tolerance  Patient tolerated treatment well       Past Medical History:  Diagnosis Date  . Coronary artery disease, non-occlusive    a. LHC 02/25/2017: no angiographically significant CAD, EF <35%, moderately elevated LVEDP at 28 mmHg.  Marland Kitchen Dependent edema    bilateral legs  . Hot flashes   . HTN (hypertension)   . Hypokalemia   . IBS (irritable bowel syndrome)   . Idiopathic Ventricular fibrillation (HCC)    a. 02/25/2017: LHC without signficant CAD; b. possibly 2/2 diarrhea with electrolyte abnormalities/Imodium usage - K 2.7; c. s/p Medtronic MRI compatible dual chamber AICD, serial number [id number] H.  . Migraines   . Nausea   . NICM w/ recovery of LV dysfxn    a. TTE 02/26/2017: EF < 20%, diffuse HK, mild AI, RV sys fxn mildly reduced; b. TTE 03/03/2017: EF 55-60%, nl WM, mild AI, PASP 46    Past Surgical History:  Procedure Laterality Date  . APPENDECTOMY  1974  . CHOLECYSTECTOMY  1984  . TONSILLECTOMY  1964    There were no vitals filed for this visit.      SLP Evaluation [facility] - 05/21/17 1228      SLP Visit Information   SLP Received On  05/21/17    Referring Provider  Dr. Jenne Campus    Onset Date  05/14/2017    Medical Diagnosis  Dysphonia      Subjective   Subjective   "I sound better than I did last week"    Patient/Family Stated Goal  To be less hoarse      Pain Assessment   Currently in Pain?  No/denies      General Information   HPI  56 year old woman, with history of lengthy and emergent intubation (August 2018), referred by Dr. Jenne Campus for voice therapy.  Per report, abnormal laryngeal findings include: mild bowing of the vocal cords.       Prior Functional Status   Cognitive/Linguistic Baseline  Within functional limits      Oral Motor/Sensory Function   Overall Oral Motor/Sensory Function  Appears within functional limits for tasks assessed      Motor Speech   Overall Motor Speech  Impaired    Respiration  Impaired    Level of Impairment  Conversation    Phonation  Aphonic;Hoarse;Breathy;Low vocal intensity    Resonance  Within functional limits    Articulation  Within functional limitis    Intelligibility  Intelligible    Phonation  Impaired    Vocal Abuses  Habitual Hyperphonia;Habitual Cough/Throat Clear;Prolonged Vocal Use    Tension Present  Jaw;Neck;Shoulder    Volume  Soft    Pitch  Appropriate      Standardized Assessments   Standardized Assessments   Other Assessment Perceptual Voice Evaluation       Perceptual Voice Evaluation Voice  checklist: . Health risks: minimal ongoing health risks  . Characteristic voice use: patient states she talks a lot on the phone . Environmental risks: no significant environmental risks . Misuse: excessive talking/singing . Abuse: coughing/throat clearing . Vocal characteristics: aphonic periods, hoarse, limited voice range, poor vocal projection, excessive pharyngeal resonance Maximum phonation time for sustained "ah": 4 seconds Average fundamental frequency during sustained "ah": 213 Hz Highest dynamic pitch when altering pitch from a low note to a high note: 196 Hz Lowest dynamic pitch when altering from a high note to a low note: 145 Hz Highest dynamic pitch in conversational speech: 226 Hz Lowest dynamic pitch in conversational speech: 157 Hz Average time patient was able to sustain /s/: 9 seconds Average time patient was able to  sustain /z/: 5.7 seconds s/z ratio : 1.6 Visi-Pitch: Multi-Dimensional Voice Program (MDVP)  MDVPT extracts objective quantitative values (Relative Average Perturbation, Shimmer, Voice Turbulence Index, and Noise to Harmonic Ratio) on sustained phonation, which are displayed graphically and numerically in comparison to a built-in normative database.  The patient exhibited values outside the norm for Relative Average Perturbation, shimmer, and Noise to Harmonic Ratio.  Average fundamental frequency was average for age and gender. Perceptually, her voice was muffled and hoarse/strained.  All parameters improved with louder and higher voice.   SLP Education - 05/21/17 1232    Education provided  Yes    Education Details  Voice production, voice therapy    Person(s) Educated  Patient    Methods  Explanation    Comprehension  Verbalized understanding         SLP Long Term Goals - 05/21/17 1235      SLP LONG TERM GOAL #1   Title  The patient will demonstrate independent understanding of extrinsic laryngeal muscle stretches and breath support exercises.      Time  8    Period  Weeks    Status  New    Target Date  07/21/17      SLP LONG TERM GOAL #2   Title  The patient will minimize vocal tension via resonant voice therapy (or comparable technique) with min SLP cues with 80% accuracy.    Time  8    Period  Weeks    Status  New    Target Date  07/21/17      SLP LONG TERM GOAL #3   Title  The patient will maximize voice quality and loudness using breath support/oral resonance for paragraph length recitation with 80% accuracy.    Time  8    Period  Weeks    Status  New    Target Date  07/21/17       Plan - 05/21/17 1233    Clinical Impression Statement  This 56 year woman under the care of Dr. Jenne Campus, with bowed vocal cords, is presenting with moderate dysphonia characterized by hoarse vocal quality, reduced breath control for speech, reduced pitch range, and intrinsic/extrinsic  laryngeal tension.  The patient will benefit from voice therapy to improve breath control for speech, reduce laryngeal tension, and promote relaxed phonation / oral resonance.      Speech Therapy Frequency  2x / week    Duration  Other (comment) 8 weeks    Treatment/Interventions  SLP instruction and feedback;Patient/family education;Other (comment) Voice therapy    Potential to Achieve Goals  Good    Potential Considerations  Ability to learn/carryover information;Co-morbidities;Cooperation/participation level;Medical prognosis;Previous level of function;Severity of impairments;Family/community support    SLP Home Exercise  Plan  neck, shoulder, tongue, and throat stretches; breath support exercises    Consulted and Agree with Plan of Care  Patient       Patient will benefit from skilled therapeutic intervention in order to improve the following deficits and impairments:   Dysphonia - Plan: SLP plan of care cert/re-cert    Problem List Patient Active Problem List   Diagnosis Date Noted  . Acute blood loss anemia   . Hypoalbuminemia due to protein-calorie malnutrition (HCC)   . Benign essential HTN   . Debility   . AKI (acute kidney injury) (HCC) 03/14/2017  . Diarrhea 03/14/2017  . Bradycardia   . Cardiac arrest with ventricular fibrillation (HCC) 02/26/2017  . Cardiac arrest (HCC) 02/26/2017  . Acute respiratory failure (HCC)   . Hypokalemia   . Encounter for central line placement   . Cardiogenic shock (HCC)   . Acute pulmonary edema (HCC)   . Anoxic encephalopathy (HCC)   . Hypertension 08/16/2016  . Edema 08/16/2016  . Nausea 08/16/2016  . Class 2 obesity due to excess calories without serious comorbidity with body mass index (BMI) of 35.0 to 35.9 in adult 08/16/2016  . Open-angle glaucoma 08/13/2012  . Encounter for routine gynecological examination 08/23/2011  . Cortical senile cataract 11/02/2010  . Vitreous degeneration 11/02/2010  . Borderline glaucoma, open angle  with borderline findings 11/01/2010  . Myopia 11/01/2010  . Presbyopia 11/01/2010   Dollene Primrose, MS/CCC- SLP  Leandrew Koyanagi 05/21/2017, 12:39 PM  [facility] MAIN [facility] SERVICES [address] [place], [place], [address] Phone: [phone]   Fax:  [phone]  Name: Kathie Swarr MRN: [id number] Date of Birth: April 09, 1961

## 2017-05-21 NOTE — Progress Notes (Signed)
Daily Session Note  Patient Details  Name: Miranda [name] MRN: [id number] Date of Birth: 01-05-1961 Referring Provider:     Cardiac Rehab from 04/28/2017 in [facility] Cardiac and Pulmonary Rehab  Referring Provider  Ida Rogue MD      Encounter Date: 05/21/2017  Check In: Session Check In - 05/21/17 0838      Check-In   Location  [facility]-Cardiac & Pulmonary Rehab    Staff Present  Darel Hong, RN BSN;Dorance Spink Darrin Nipper, [place], ACSM RCEP, Exercise Physiologist    Supervising physician immediately available to respond to emergencies  See telemetry face sheet for immediately available ER MD    Medication changes reported      No    Fall or balance concerns reported     No    Warm-up and Cool-down  Performed on first and last piece of equipment    Resistance Training Performed  Yes    VAD Patient?  No      Pain Assessment   Currently in Pain?  No/denies        Exercise Prescription Changes - 05/20/17 1400      Response to Exercise   Blood Pressure (Admit)  124/64    Blood Pressure (Exercise)  142/88    Blood Pressure (Exit)  122/84    Heart Rate (Admit)  63 bpm    Heart Rate (Exercise)  107 bpm    Heart Rate (Exit)  75 bpm    Rating of Perceived Exertion (Exercise)  11    Symptoms  none    Duration  Continue with 45 min of aerobic exercise without signs/symptoms of physical distress.    Intensity  THRR unchanged      Progression   Progression  Continue to progress workloads to maintain intensity without signs/symptoms of physical distress.    Average METs  4.37      Resistance Training   Training Prescription  Yes    Weight  4 lbs    Reps  10-15      Interval Training   Interval Training  No      Treadmill   MPH  3    Grade  1    Minutes  15    METs  3.71      NuStep   Level  4    Minutes  15    METs  5.9      REL-XR   Level  4    Minutes  15    METs  3.5      Home Exercise Plan   Plans to continue exercise at  Home (comment)  walking and gym in apartment complex    Frequency  Add 2 additional days to program exercise sessions.    Initial Home Exercises Provided  05/09/17       Social History   Tobacco Use  Smoking Status Never Smoker  Smokeless Tobacco Never Used    Goals Met:  Independence with exercise equipment Exercise tolerated well No report of cardiac concerns or symptoms Strength training completed today  Goals Unmet:  Not Applicable  Comments: Pt able to follow exercise prescription today without complaint.  Will continue to monitor for progression.   Dr. Emily Filbert is Medical Director for [place] and [facility].

## 2017-05-22 ENCOUNTER — Ambulatory Visit
Admission: RE | Admit: 2017-05-22 | Discharge: 2017-05-22 | Disposition: A | Discharge status: Home / Self Care | Payer: BC Managed Care – PPO | Source: Ambulatory Visit | Attending: Gastroenterology | Admitting: Gastroenterology

## 2017-05-22 ENCOUNTER — Encounter: Payer: Self-pay | Admitting: Anesthesiology

## 2017-05-22 ENCOUNTER — Ambulatory Visit: Payer: BC Managed Care – PPO | Admitting: Anesthesiology

## 2017-05-22 ENCOUNTER — Encounter
Admission: RE | Disposition: A | Discharge status: Home / Self Care | Payer: Self-pay | Source: Ambulatory Visit | Attending: Gastroenterology

## 2017-05-22 DIAGNOSIS — K589 Irritable bowel syndrome without diarrhea: Secondary | ICD-10-CM | POA: Insufficient documentation

## 2017-05-22 DIAGNOSIS — Z1211 Encounter for screening for malignant neoplasm of colon: Secondary | ICD-10-CM | POA: Insufficient documentation

## 2017-05-22 DIAGNOSIS — I509 Heart failure, unspecified: Secondary | ICD-10-CM | POA: Insufficient documentation

## 2017-05-22 DIAGNOSIS — Z7982 Long term (current) use of aspirin: Secondary | ICD-10-CM | POA: Insufficient documentation

## 2017-05-22 DIAGNOSIS — Z8249 Family history of ischemic heart disease and other diseases of the circulatory system: Secondary | ICD-10-CM | POA: Diagnosis not present

## 2017-05-22 DIAGNOSIS — I739 Peripheral vascular disease, unspecified: Secondary | ICD-10-CM | POA: Insufficient documentation

## 2017-05-22 DIAGNOSIS — I4901 Ventricular fibrillation: Secondary | ICD-10-CM | POA: Diagnosis not present

## 2017-05-22 DIAGNOSIS — I429 Cardiomyopathy, unspecified: Secondary | ICD-10-CM | POA: Diagnosis not present

## 2017-05-22 DIAGNOSIS — Z888 Allergy status to other drugs, medicaments and biological substances status: Secondary | ICD-10-CM | POA: Insufficient documentation

## 2017-05-22 DIAGNOSIS — Z880 Allergy status to penicillin: Secondary | ICD-10-CM | POA: Diagnosis not present

## 2017-05-22 DIAGNOSIS — Z79899 Other long term (current) drug therapy: Secondary | ICD-10-CM | POA: Diagnosis not present

## 2017-05-22 DIAGNOSIS — I2510 Atherosclerotic heart disease of native coronary artery without angina pectoris: Secondary | ICD-10-CM | POA: Diagnosis not present

## 2017-05-22 DIAGNOSIS — I110 Hypertensive heart disease with heart failure: Secondary | ICD-10-CM | POA: Diagnosis not present

## 2017-05-22 DIAGNOSIS — Z95810 Presence of automatic (implantable) cardiac defibrillator: Secondary | ICD-10-CM | POA: Diagnosis not present

## 2017-05-22 HISTORY — PX: COLONOSCOPY WITH PROPOFOL: SHX5780

## 2017-05-22 SURGERY — COLONOSCOPY WITH PROPOFOL
Anesthesia: General

## 2017-05-22 MED ORDER — GLYCOPYRROLATE 0.2 MG/ML IJ SOLN
INTRAMUSCULAR | Status: DC | PRN
  Administered 2017-05-22: 0.2 mg via INTRAVENOUS

## 2017-05-22 MED ORDER — PROPOFOL 10 MG/ML IV BOLUS
INTRAVENOUS | Status: DC | PRN
  Administered 2017-05-22 (×3): 10 mg via INTRAVENOUS

## 2017-05-22 MED ORDER — LIDOCAINE HCL (PF) 1 % IJ SOLN
2.0000 mL | Freq: Once | INTRAMUSCULAR | Status: AC
  Administered 2017-05-22: 0.3 mL via INTRADERMAL

## 2017-05-22 MED ORDER — SODIUM CHLORIDE 0.9 % IV SOLN
INTRAVENOUS | Status: DC
Start: 2017-05-22 — End: 2017-05-22
  Administered 2017-05-22: 1000 mL via INTRAVENOUS

## 2017-05-22 MED ORDER — LIDOCAINE HCL (PF) 1 % IJ SOLN
INTRAMUSCULAR | Status: AC
  Administered 2017-05-22: 0.3 mL via INTRADERMAL
  Filled 2017-05-22: qty 2

## 2017-05-22 MED ORDER — PROPOFOL 500 MG/50ML IV EMUL
INTRAVENOUS | Status: DC | PRN
  Administered 2017-05-22: 75 ug/kg/min via INTRAVENOUS

## 2017-05-22 NOTE — Anesthesia Post-op Follow-up Note (Signed)
Anesthesia QCDR form completed.        

## 2017-05-22 NOTE — Transfer of Care (Signed)
Immediate Anesthesia Transfer of Care Note  Patient: Miranda [name]  Procedure(s) Performed: COLONOSCOPY WITH PROPOFOL (N/A )  Patient Location: PACU and Endoscopy Unit  Anesthesia Type:MAC  Level of Consciousness: awake  Airway & Oxygen Therapy: Patient Spontanous Breathing  Post-op Assessment: Report given to RN  Post vital signs: Reviewed  Last Vitals:  Vitals:   05/22/17 0736 05/22/17 0933  BP: 135/81 124/76  Pulse: 81 64  Resp: 17 16  Temp: (!) 36.3 C (!) 36.3 C  SpO2: 100% 100%    Last Pain:  Vitals:   05/22/17 0933  TempSrc: Tympanic         Complications: No apparent anesthesia complications

## 2017-05-22 NOTE — H&P (Signed)
Wyline Mood, MD [address], [address], [place], [place], [address] [address], [address], [place], [place], [address] Phone: [phone]  Fax: [phone]  Primary Care Physician:  Judeen Hammans, MD   Pre-Procedure History & Physical: HPI:  Miranda [name] is here for an colonoscopy.   Past Medical History:  Diagnosis Date  . Coronary artery disease, non-occlusive    a. LHC 02/25/2017: no angiographically significant CAD, EF <35%, moderately elevated LVEDP at 28 mmHg.  Marland Kitchen Dependent edema    bilateral legs  . Hot flashes   . HTN (hypertension)   . Hypokalemia   . IBS (irritable bowel syndrome)   . Idiopathic Ventricular fibrillation (HCC)    a. 02/25/2017: LHC without signficant CAD; b. possibly 2/2 diarrhea with electrolyte abnormalities/Imodium usage - K 2.7; c. s/p Medtronic MRI compatible dual chamber AICD, serial number [id number] H.  . Migraines   . Nausea   . NICM w/ recovery of LV dysfxn    a. TTE 02/26/2017: EF < 20%, diffuse HK, mild AI, RV sys fxn mildly reduced; b. TTE 03/03/2017: EF 55-60%, nl WM, mild AI, PASP 46    Past Surgical History:  Procedure Laterality Date  . APPENDECTOMY  1974  . CHOLECYSTECTOMY  1984  . ICD IMPLANT N/A 03/17/2017   Procedure: ICD Implant;  Surgeon: Duke Salvia, MD;  Location: [facility] INVASIVE CV LAB;  Service: Cardiovascular;  Laterality: N/A;  . LEFT HEART CATH AND CORONARY ANGIOGRAPHY N/A 02/26/2017   Procedure: LEFT HEART CATH AND CORONARY ANGIOGRAPHY;  Surgeon: Yvonne Kendall, MD;  Location: [facility] INVASIVE CV LAB;  Service: Cardiovascular;  Laterality: N/A;  . TONSILLECTOMY  1964    Prior to Admission medications   Medication Sig Start Date End Date Taking? Authorizing Provider  aspirin EC 81 MG EC tablet Take 1 tablet (81 mg total) by mouth daily. 03/19/17   Sheilah Pigeon, PA-C  carvedilol (COREG) 3.125 MG tablet Take 1 tablet (3.125 mg total) by mouth 2 (two) times daily with a meal. 04/10/17    Duke Salvia, MD  lipase/protease/amylase (CREON) 36000 UNITS CPEP capsule Take 2 capsules (72,000 Units total) 3 (three) times daily before meals by mouth. 05/14/17   Wyline Mood, MD  potassium chloride SA (K-DUR,KLOR-CON) 20 MEQ tablet Take 1 tablet (20 mEq total) by mouth daily. 04/10/17   Duke Salvia, MD    Allergies as of 05/14/2017 - Review Complete 05/14/2017  Allergen Reaction Noted  . Ace inhibitors  08/13/2016  . Compazine [prochlorperazine edisylate]  08/13/2016  . Lisinopril  08/13/2016  . Penicillins  08/13/2016    Family History  Problem Relation Age of Onset  . Lymphoma Mother   . Heart disease Father   . Stroke Father   . Hyperlipidemia Father   . Hypertension Father   . Hypertension Sister   . Migraines Sister   . Hypertension Brother     Social History   Socioeconomic History  . Marital status: Married    Spouse name: Not on file  . Number of children: Not on file  . Years of education: Not on file  . Highest education level: Not on file  Social Needs  . Financial resource strain: Not on file  . Food insecurity - worry: Not on file  . Food insecurity - inability: Not on file  . Transportation needs - medical: Not on file  . Transportation needs - non-medical: Not on file  Occupational History  .  Occupation: Teacher, adult educationN    Employer: [employer]  Tobacco Use  . Smoking status: Never Smoker  . Smokeless tobacco: Never Used  Substance and Sexual Activity  . Alcohol use: No  . Drug use: No  . Sexual activity: Yes    Birth control/protection: Post-menopausal  Other Topics Concern  . Not on file  Social History Narrative  . Not on file    Review of Systems: See HPI, otherwise negative ROS  Physical Exam: BP 135/81   Pulse 81   Temp (!) 97.3 F (36.3 C) (Tympanic)   Resp 17   Ht 5' (1.524 m)   Wt 177 lb (80.3 kg)   SpO2 100%   BMI 34.57 kg/m  General:   Alert,  pleasant and cooperative in NAD Head:  Normocephalic and  atraumatic. Neck:  Supple; no masses or thyromegaly. Lungs:  Clear throughout to auscultation, normal respiratory effort.    Heart:  +S1, +S2, Regular rate and rhythm, No edema. Abdomen:  Soft, nontender and nondistended. Normal bowel sounds, without guarding, and without rebound.   Neurologic:  Alert and  oriented x4;  grossly normal neurologically.  Impression/Plan: Miranda [name] is here for an colonoscopy to be performed for Screening colonoscopy average risk   Risks, benefits, limitations, and alternatives regarding  colonoscopy have been reviewed with the patient.  Questions have been answered.  All parties agreeable.   Wyline MoodKiran Tarvaris Puglia, MD  05/22/2017, 9:03 AM

## 2017-05-22 NOTE — Anesthesia Postprocedure Evaluation (Signed)
Anesthesia Post Note  Patient: Miranda [name]  Procedure(s) Performed: COLONOSCOPY WITH PROPOFOL (N/A )  Patient location during evaluation: PACU Anesthesia Type: General Level of consciousness: awake and alert and oriented Pain management: pain level controlled Vital Signs Assessment: post-procedure vital signs reviewed and stable Respiratory status: spontaneous breathing Cardiovascular status: blood pressure returned to baseline Anesthetic complications: no     Last Vitals:  Vitals:   05/22/17 0736 05/22/17 0933  BP: 135/81 124/76  Pulse: 81 64  Resp: 17 16  Temp: (!) 36.3 C (!) 36.3 C  SpO2: 100% 100%    Last Pain:  Vitals:   05/22/17 0933  TempSrc: Tympanic                 Phylis Javed

## 2017-05-22 NOTE — Anesthesia Preprocedure Evaluation (Addendum)
Anesthesia Evaluation  Patient identified by MRN, date of birth, ID band Patient awake    Reviewed: Allergy & Precautions, NPO status , Patient's Chart, lab work & pertinent test results, reviewed documented beta blocker date and time   Airway Mallampati: III  TM Distance: <3 FB     Dental   Pulmonary neg pulmonary ROS,    Pulmonary exam normal        Cardiovascular hypertension, Pt. on medications and Pt. on home beta blockers + CAD, + Peripheral Vascular Disease and +CHF  Normal cardiovascular exam+ dysrhythmias Ventricular Fibrillation + Cardiac Defibrillator      Neuro/Psych  Headaches, negative psych ROS   GI/Hepatic Neg liver ROS, IBS   Endo/Other  negative endocrine ROS  Renal/GU Renal disease  negative genitourinary   Musculoskeletal negative musculoskeletal ROS (+)   Abdominal Normal abdominal exam  (+)   Peds negative pediatric ROS (+)  Hematology  (+) anemia ,   Anesthesia Other Findings Apparent difficult intubation in the ICU after arrest  Reproductive/Obstetrics                           Anesthesia Physical Anesthesia Plan  ASA: III  Anesthesia Plan: General   Post-op Pain Management:    Induction: Intravenous  PONV Risk Score and Plan:   Airway Management Planned: Nasal Cannula  Additional Equipment:   Intra-op Plan:   Post-operative Plan:   Informed Consent: I have reviewed the patients History and Physical, chart, labs and discussed the procedure including the risks, benefits and alternatives for the proposed anesthesia with the patient or authorized representative who has indicated his/her understanding and acceptance.   Dental advisory given  Plan Discussed with: CRNA and Surgeon  Anesthesia Plan Comments:         Anesthesia Quick Evaluation

## 2017-05-22 NOTE — Op Note (Signed)
[facility] Gastroenterology Patient Name: Miranda [name] Procedure Date: 05/22/2017 9:01 AM MRN: [id number] Account #: [id number] Date of Birth: 1960/10/18 Admit Type: Outpatient Age: 56 Room: [facility] ENDO ROOM 3 Gender: Female Note Status: Finalized Procedure:            Colonoscopy Indications:          Screening for colorectal malignant neoplasm Providers:            Wyline Mood MD, MD Referring MD:         Onnie Boer. Sowles, MD (Referring MD) Medicines:            Monitored Anesthesia Care Complications:        No immediate complications. Procedure:            Pre-Anesthesia Assessment:                       - Prior to the procedure, a History and Physical was                        performed, and patient medications, allergies and                        sensitivities were reviewed. The patient's tolerance of                        previous anesthesia was reviewed.                       - The risks and benefits of the procedure and the                        sedation options and risks were discussed with the                        patient. All questions were answered and informed                        consent was obtained.                       - ASA Grade Assessment: III - A patient with severe                        systemic disease.                       After obtaining informed consent, the colonoscope was                        passed under direct vision. Throughout the procedure,                        the patient's blood pressure, pulse, and oxygen                        saturations were monitored continuously. The                        Colonoscope was introduced through the anus and  advanced to the the cecum, identified by the                        appendiceal orifice, IC valve and transillumination.                        The colonoscopy was performed with ease. The patient                        tolerated the procedure well.  The quality of the bowel                        preparation was poor. Findings:      The perianal and digital rectal examinations were normal.      A large amount of semi-solid stool was found at the hepatic flexure, in       the ascending colon and in the cecum.      The exam was otherwise without abnormality on direct and retroflexion       views. Impression:           - Preparation of the colon was poor.                       - Stool at the hepatic flexure, in the ascending colon                        and in the cecum.                       - The examination was otherwise normal on direct and                        retroflexion views.                       - No specimens collected. Recommendation:       - Discharge patient to home (with escort).                       - Resume previous diet.                       - Continue present medications.                       - Repeat colonoscopy in 6 months because the bowel                        preparation was suboptimal. Procedure Code(s):    --- Professional ---                       [phone] [id number], Colonoscopy, flexible; diagnostic, including                        collection of specimen(s) by brushing or washing, when                        performed (separate procedure) Diagnosis Code(s):    --- Professional ---                       Z12.11, Encounter  for screening for malignant neoplasm                        of colon CPT copyright 2016 American Medical Association. All rights reserved. The codes documented in this report are preliminary and upon coder review may  be revised to meet current compliance requirements. Wyline Mood, MD Wyline Mood MD, MD 05/22/2017 9:28:50 AM This report has been signed electronically. Number of Addenda: 0 Note Initiated On: 05/22/2017 9:01 AM Scope Withdrawal Time: 0 hours 5 minutes 57 seconds  Total Procedure Duration: 0 hours 10 minutes 31 seconds       [facility]

## 2017-05-23 ENCOUNTER — Encounter: Payer: Self-pay | Admitting: Gastroenterology

## 2017-05-23 DIAGNOSIS — I469 Cardiac arrest, cause unspecified: Secondary | ICD-10-CM | POA: Diagnosis not present

## 2017-05-23 NOTE — Progress Notes (Signed)
Daily Session Note  Patient Details  Name: Rozalynn Buege MRN: [id number] Date of Birth: [birth year] Referring Provider:     Cardiac Rehab from 04/28/2017 in [facility] Cardiac and Pulmonary Rehab  Referring Provider  Ida Rogue MD      Encounter Date: 05/23/2017  Check In: Session Check In - 05/23/17 0851      Check-In   Location  [facility]-Cardiac & Pulmonary Rehab    Staff Present  Nada Maclachlan, BA, ACSM CEP, Exercise Physiologist;Carroll Enterkin, RN, Levie Heritage, MA, ACSM RCEP, Exercise Physiologist    Supervising physician immediately available to respond to emergencies  See telemetry face sheet for immediately available ER MD    Medication changes reported      No    Fall or balance concerns reported     No    Warm-up and Cool-down  Performed on first and last piece of equipment    Resistance Training Performed  Yes    VAD Patient?  No      Pain Assessment   Currently in Pain?  No/denies          Social History   Tobacco Use  Smoking Status Never Smoker  Smokeless Tobacco Never Used    Goals Met:  Independence with exercise equipment Exercise tolerated well No report of cardiac concerns or symptoms Strength training completed today  Goals Unmet:  Not Applicable  Comments: Pt able to follow exercise prescription today without complaint.  Will continue to monitor for progression.   Dr. Emily Filbert is Medical Director for [facility] and [facility].

## 2017-05-27 ENCOUNTER — Ambulatory Visit: Payer: BC Managed Care – PPO | Admitting: Speech Pathology

## 2017-05-28 DIAGNOSIS — I469 Cardiac arrest, cause unspecified: Secondary | ICD-10-CM | POA: Diagnosis not present

## 2017-05-28 NOTE — Progress Notes (Signed)
Daily Session Note  Patient Details  Name: Miranda [name] MRN: [id number] Date of Birth: January 21, 1961 Referring Provider:     Cardiac Rehab from 04/28/2017 in [facility] Cardiac and Pulmonary Rehab  Referring Provider  Ida Rogue MD      Encounter Date: 05/28/2017  Check In: Session Check In - 05/28/17 0727      Check-In   Location  [facility]-Cardiac & Pulmonary Rehab    Staff Present  Justin Mend Lorre Nick, MA, ACSM RCEP, Exercise Physiologist;Susanne Bice, RN, BSN, CCRP    Supervising physician immediately available to respond to emergencies  See telemetry face sheet for immediately available ER MD    Medication changes reported      No    Fall or balance concerns reported     No    Warm-up and Cool-down  Performed on first and last piece of equipment    Resistance Training Performed  Yes    VAD Patient?  No      Pain Assessment   Currently in Pain?  No/denies          Social History   Tobacco Use  Smoking Status Never Smoker  Smokeless Tobacco Never Used    Goals Met:  Independence with exercise equipment Exercise tolerated well No report of cardiac concerns or symptoms Strength training completed today  Goals Unmet:  Not Applicable  Comments: Pt able to follow exercise prescription today without complaint.  Will continue to monitor for progression.   Dr. Emily Filbert is Medical Director for [place] and [facility].

## 2017-06-09 ENCOUNTER — Encounter: Payer: BC Managed Care – PPO | Attending: Cardiovascular Disease

## 2017-06-09 DIAGNOSIS — I2510 Atherosclerotic heart disease of native coronary artery without angina pectoris: Secondary | ICD-10-CM | POA: Insufficient documentation

## 2017-06-09 DIAGNOSIS — I110 Hypertensive heart disease with heart failure: Secondary | ICD-10-CM | POA: Insufficient documentation

## 2017-06-09 DIAGNOSIS — I469 Cardiac arrest, cause unspecified: Secondary | ICD-10-CM | POA: Insufficient documentation

## 2017-06-09 DIAGNOSIS — Z79899 Other long term (current) drug therapy: Secondary | ICD-10-CM | POA: Insufficient documentation

## 2017-06-09 DIAGNOSIS — Z7982 Long term (current) use of aspirin: Secondary | ICD-10-CM | POA: Insufficient documentation

## 2017-06-09 DIAGNOSIS — I4901 Ventricular fibrillation: Secondary | ICD-10-CM | POA: Insufficient documentation

## 2017-06-09 DIAGNOSIS — I5022 Chronic systolic (congestive) heart failure: Secondary | ICD-10-CM | POA: Insufficient documentation

## 2017-06-11 ENCOUNTER — Encounter: Payer: Self-pay | Admitting: *Deleted

## 2017-06-11 DIAGNOSIS — I469 Cardiac arrest, cause unspecified: Secondary | ICD-10-CM

## 2017-06-11 NOTE — Progress Notes (Signed)
Cardiac Individual Treatment Plan  Patient Details  Name: Demmi Sindt MRN: [id number] Date of Birth: 06/19/1961 Referring Provider:     Cardiac Rehab from 04/28/2017 in [facility] Cardiac and Pulmonary Rehab  Referring Provider  Ida Rogue MD      Initial Encounter Date:    Cardiac Rehab from 04/28/2017 in [facility] Cardiac and Pulmonary Rehab  Date  04/28/17  Referring Provider  Ida Rogue MD      Visit Diagnosis: Cardiac arrest [facility])  Patient's Home Medications on Admission:  Current Outpatient Medications:  .  aspirin EC 81 MG EC tablet, Take 1 tablet (81 mg total) by mouth daily., Disp: , Rfl:  .  carvedilol (COREG) 3.125 MG tablet, Take 1 tablet (3.125 mg total) by mouth 2 (two) times daily with a meal., Disp: 180 tablet, Rfl: 3 .  lipase/protease/amylase (CREON) 36000 UNITS CPEP capsule, Take 2 capsules (72,000 Units total) 3 (three) times daily before meals by mouth., Disp: 90 capsule, Rfl: 3 .  potassium chloride SA (K-DUR,KLOR-CON) 20 MEQ tablet, Take 1 tablet (20 mEq total) by mouth daily., Disp: 90 tablet, Rfl: 3  Past Medical History: Past Medical History:  Diagnosis Date  . Coronary artery disease, non-occlusive    a. LHC 02/25/2017: no angiographically significant CAD, EF <35%, moderately elevated LVEDP at 28 mmHg.  Marland Kitchen Dependent edema    bilateral legs  . Hot flashes   . HTN (hypertension)   . Hypokalemia   . IBS (irritable bowel syndrome)   . 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 02/25/2017: LHC without signficant CAD; b. possibly 2/2 diarrhea with electrolyte abnormalities/Imodium usage - K 2.7; c. s/p Medtronic MRI compatible dual chamber AICD, serial number [id number].  . Migraines   . Nausea   . NICM w/ recovery of LV dysfxn    a. TTE 02/26/2017: EF < 20%, diffuse HK, mild AI, RV sys fxn mildly reduced; b. TTE 03/03/2017: EF 55-60%, nl WM, mild AI, PASP 46    Tobacco Use: Social History   Tobacco Use  Smoking Status Never Smoker  Smokeless  Tobacco Never Used    Labs: Recent Review Flowsheet Data    Labs for ITP Cardiac and Pulmonary Rehab Latest Ref Rng & Units 03/06/2017 03/07/2017 03/09/2017 03/10/2017 03/15/2017   Trlycerides <150 mg/dL 329(H) 262(H) - 119 -   Hemoglobin A1c 4.8 - 5.6 % - - - - 7.6(H)   PHART 7.350 - 7.450 7.48(H) - 7.51(H) - -   PCO2ART 32.0 - 48.0 mmHg 37 - 37 - -   HCO3 20.0 - 28.0 mmol/L 27.6 - 29.5(H) - -   ACIDBASEDEF 0.0 - 2.0 mmol/L - - - - -   O2SAT % 98.8 - 97.8 - -       Exercise Target Goals:    Exercise Program Goal: Individual exercise prescription set with THRR, safety & activity barriers. Participant demonstrates ability to understand and report RPE using BORG scale, to self-measure pulse accurately, and to acknowledge the importance of the exercise prescription.  Exercise Prescription Goal: Starting with aerobic activity 30 plus minutes a day, 3 days per week for initial exercise prescription. Provide home exercise prescription and guidelines that participant acknowledges understanding prior to discharge.  Activity Barriers & Risk Stratification: Activity Barriers & Cardiac Risk Stratification - 04/28/17 1244      Activity Barriers & Cardiac Risk Stratification   Activity Barriers  Deconditioning;Muscular Weakness;Decreased Ventricular Function    Cardiac Risk Stratification  High       6 Minute  Walk: 6 Minute Walk    Row Name 04/28/17 1242         6 Minute Walk   Distance  1525 feet     Walk Time  6 minutes     # of Rest Breaks  0     MPH  2.8     METS  3.5     RPE  12     Perceived Dyspnea   0     VO2 Peak  12.59     Symptoms  No     Resting HR  64 bpm     Resting BP  116/70     Resting Oxygen Saturation   100 %     Exercise Oxygen Saturation  during 6 min walk  97 %     Max Ex. HR  98 bpm     Max Ex. BP  146/84     2 Minute Post BP  134/72        Oxygen Initial Assessment:   Oxygen Re-Evaluation:   Oxygen Discharge (Final Oxygen  Re-Evaluation):   Initial Exercise Prescription: Initial Exercise Prescription - 04/28/17 1200      Date of Initial Exercise RX and Referring Provider   Date  04/28/17    Referring Provider  Ida Rogue MD      Treadmill   MPH  2.8    Grade  1    Minutes  15    METs  3.5      NuStep   Level  2    SPM  80    Minutes  15    METs  3      REL-XR   Level  2    Watts  50    Speed  2.8    Minutes  15    METs  3      Prescription Details   Frequency (times per week)  2    Duration  Progress to 45 minutes of aerobic exercise without signs/symptoms of physical distress      Intensity   THRR 40-80% of Max Heartrate  65-130    Ratings of Perceived Exertion  11-13    Perceived Dyspnea  0-4      Progression   Progression  Continue progressive overload as per policy without signs/symptoms or physical distress.      Resistance Training   Training Prescription  Yes    Weight  4    Reps  10-15       Perform Capillary Blood Glucose checks as needed.  Exercise Prescription Changes: Exercise Prescription Changes    Row Name 04/28/17 1200 05/07/17 1500 05/09/17 0800 05/20/17 1400 06/04/17 1400     Response to Exercise   Blood Pressure (Admit)  116/70  124/70  -  124/64  106/68   Blood Pressure (Exercise)  146/84  130/76  -  142/88  132/70   Blood Pressure (Exit)  134/72  124/72  -  122/84  122/66   Heart Rate (Admit)  64 bpm  73 bpm  -  63 bpm  84 bpm   Heart Rate (Exercise)  98 bpm  104 bpm  -  107 bpm  106 bpm   Heart Rate (Exit)  68 bpm  85 bpm  -  75 bpm  67 bpm   Oxygen Saturation (Admit)  100 %  -  -  -  -   Oxygen Saturation (Exercise)  97 %  -  -  -  -  Oxygen Saturation (Exit)  97 %  -  -  -  -   Rating of Perceived Exertion (Exercise)  12  12  -  11  12   Perceived Dyspnea (Exercise)  0  -  -  -  -   Symptoms  None  none  -  none  none   Comments  walk test completed   second full day of exercise  -  -  -   Duration  Progress to 45 minutes of aerobic  exercise without signs/symptoms of physical distress  Progress to 45 minutes of aerobic exercise without signs/symptoms of physical distress  -  Continue with 45 min of aerobic exercise without signs/symptoms of physical distress.  Continue with 45 min of aerobic exercise without signs/symptoms of physical distress.   Intensity  THRR New  THRR unchanged  -  THRR unchanged  THRR unchanged     Progression   Progression  -  Continue to progress workloads to maintain intensity without signs/symptoms of physical distress.  -  Continue to progress workloads to maintain intensity without signs/symptoms of physical distress.  Continue to progress workloads to maintain intensity without signs/symptoms of physical distress.   Average METs  -  3.51  -  4.37  4.34     Resistance Training   Training Prescription  -  Yes  -  Yes  Yes   Weight  -  4 lbs  -  4 lbs  4 lbs   Reps  -  10-15  -  10-15  10-15     Interval Training   Interval Training  -  No  -  No  No     Treadmill   MPH  -  2.8  -  3  3   Grade  -  1  -  1  1   Minutes  -  15  -  15  15   METs  -  3.5  -  3.71  3.71     NuStep   Level  -  2  -  4  4   Minutes  -  15  -  15  15   METs  -  2.7  -  5.9  3.2     REL-XR   Level  -  2  -  4  4   Speed  -  50  -  -  -   Minutes  -  15  -  15  15   METs  -  4.3  -  3.5  6.1     Home Exercise Plan   Plans to continue exercise at  -  -  Home (comment) walking and gym in apartment complex  Home (comment) walking and gym in apartment complex  Home (comment) walking and gym in apartment complex   Frequency  -  -  Add 2 additional days to program exercise sessions.  Add 2 additional days to program exercise sessions.  Add 2 additional days to program exercise sessions.   Initial Home Exercises Provided  -  -  05/09/17  05/09/17  05/09/17      Exercise Comments: Exercise Comments    Row Name 04/30/17 0741           Exercise Comments  First full day of exercise!  Patient was oriented to  gym and equipment including functions, settings, policies, and procedures.  Patient's individual exercise prescription and treatment plan were reviewed.  All starting workloads were established based on the results of the 6 minute walk test done at initial orientation visit.  The plan for exercise progression was also introduced and progression will be customized based on patient's performance and goals.          Exercise Goals and Review: Exercise Goals    Row Name 04/28/17 1253             Exercise Goals   Increase Physical Activity  Yes       Intervention  Provide advice, education, support and counseling about physical activity/exercise needs.;Develop an individualized exercise prescription for aerobic and resistive training based on initial evaluation findings, risk stratification, comorbidities and participant's personal goals.       Expected Outcomes  Achievement of increased cardiorespiratory fitness and enhanced flexibility, muscular endurance and strength shown through measurements of functional capacity and personal statement of participant.       Increase Strength and Stamina  Yes       Intervention  Provide advice, education, support and counseling about physical activity/exercise needs.;Develop an individualized exercise prescription for aerobic and resistive training based on initial evaluation findings, risk stratification, comorbidities and participant's personal goals.       Expected Outcomes  Achievement of increased cardiorespiratory fitness and enhanced flexibility, muscular endurance and strength shown through measurements of functional capacity and personal statement of participant.       Able to understand and use rate of perceived exertion (RPE) scale  Yes       Intervention  Provide education and explanation on how to use RPE scale       Expected Outcomes  Short Term: Able to use RPE daily in rehab to express subjective intensity level;Long Term:  Able to use RPE to  guide intensity level when exercising independently       Knowledge and understanding of Target Heart Rate Range (THRR)  Yes       Intervention  Provide education and explanation of THRR including how the numbers were predicted and where they are located for reference       Expected Outcomes  Short Term: Able to state/look up THRR;Short Term: Able to use daily as guideline for intensity in rehab;Long Term: Able to use THRR to govern intensity when exercising independently       Able to check pulse independently  Yes       Intervention  Provide education and demonstration on how to check pulse in carotid and radial arteries.;Review the importance of being able to check your own pulse for safety during independent exercise       Expected Outcomes  Short Term: Able to explain why pulse checking is important during independent exercise;Long Term: Able to check pulse independently and accurately       Understanding of Exercise Prescription  Yes       Intervention  Provide education, explanation, and written materials on patient's individual exercise prescription       Expected Outcomes  Short Term: Able to explain program exercise prescription;Long Term: Able to explain home exercise prescription to exercise independently          Exercise Goals Re-Evaluation : Exercise Goals Re-Evaluation    Row Name 04/30/17 0741 05/07/17 1545 05/09/17 0818 05/20/17 1443 06/04/17 1445     Exercise Goal Re-Evaluation   Exercise Goals Review  Able to understand and use rate of perceived exertion (RPE) scale;Knowledge and understanding of Target Heart Rate Range (THRR);Understanding of Exercise Prescription  -  Increase Physical Activity;Able to understand and use Dyspnea scale;Understanding of Exercise Prescription;Increase Strength and Stamina;Knowledge and understanding of Target Heart Rate Range (THRR);Able to understand and use rate of perceived exertion (RPE) scale;Able to check pulse independently  Increase  Physical Activity;Increase Strength and Stamina;Understanding of Exercise Prescription  Increase Physical Activity;Increase Strength and Stamina;Understanding of Exercise Prescription   Comments  Reviewed RPE scale, THR and program prescription with pt today.  Pt voiced understanding and was given a copy of goals to take home.   Crystie is off to a good start in rehab.  She has completed two full days of exercise.  We will continue to monitor her progression.   Reviewed home exercise with pt today.  Pt plans to walk and go to gym in her apartment complex for exercise.  Reviewed THR, pulse, RPE, sign and symptoms, NTG use, and when to call 911 or MD.  Also discussed weather considerations and indoor options.  Pt voiced understanding.  Leshonda has been doing well in rehab.  Se is now up to level 4 on the XR and 3.0 mph on the treadmill.  We will continue to monitor her progression.   Rayna has been doing well in rehab.  She is currently out of town, but promised to walk everyday that she was gone. She also said that she would be active chasing her granddaughter around.  She is now using 4 lb weights!  We will continue to monitor her progress and increase workloads when she returns.    Expected Outcomes  Short: Use RPE daily to regulate intensity.  Long: Follow program prescription in THR.  Short: Attend rehab regularly.  Long: Make exercise part of routine.   Short: Add in two extra days a week.  Long: Make exercise part of daily routine.   Short: Continue to try to increase workloads.  Long: Exercise more at home.   Short: Return to rehab regularly.  Long: Increase more workloads.       Discharge Exercise Prescription (Final Exercise Prescription Changes): Exercise Prescription Changes - 06/04/17 1400      Response to Exercise   Blood Pressure (Admit)  106/68    Blood Pressure (Exercise)  132/70    Blood Pressure (Exit)  122/66    Heart Rate (Admit)  84 bpm    Heart Rate (Exercise)  106 bpm    Heart Rate  (Exit)  67 bpm    Rating of Perceived Exertion (Exercise)  12    Symptoms  none    Duration  Continue with 45 min of aerobic exercise without signs/symptoms of physical distress.    Intensity  THRR unchanged      Progression   Progression  Continue to progress workloads to maintain intensity without signs/symptoms of physical distress.    Average METs  4.34      Resistance Training   Training Prescription  Yes    Weight  4 lbs    Reps  10-15      Interval Training   Interval Training  No      Treadmill   MPH  3    Grade  1    Minutes  15    METs  3.71      NuStep   Level  4    Minutes  15    METs  3.2      REL-XR   Level  4    Minutes  15    METs  6.1  Home Exercise Plan   Plans to continue exercise at  Home (comment) walking and gym in apartment complex    Frequency  Add 2 additional days to program exercise sessions.    Initial Home Exercises Provided  05/09/17       Nutrition:  Target Goals: Understanding of nutrition guidelines, daily intake of sodium <1552m, cholesterol <2044m calories 30% from fat and 7% or less from saturated fats, daily to have 5 or more servings of fruits and vegetables.  Biometrics: Pre Biometrics - 04/28/17 1254      Pre Biometrics   Height  5' 1.1" (1.552 m)    Weight  181 lb 1 oz (82.1 kg)    Waist Circumference  34.5 inches    Hip Circumference  44.5 inches    Waist to Hip Ratio  0.78 %    BMI (Calculated)  34.1    Single Leg Stand  7.08 seconds        Nutrition Therapy Plan and Nutrition Goals: Nutrition Therapy & Goals - 05/15/17 1715      Nutrition Therapy   Diet  TLC, low fiber due to pancreatic insufficiency and IBS    Protein (specify units)  6-7oz    Fruits and Vegetables  5 servings/day    Sodium  1500 grams      Personal Nutrition Goals   Nutrition Goal  Avoid hard, crunchy foods and large portions of high fiber veggies and fruits.     Personal Goal #2  Keep sodium intake to 150030maily, an average of  500m48mth each meal.     Comments  Ms. StolLaatsch had significant relief of diarrhea since beginning treatment with Creon (digestive enzymes). She is working to control sodium intake. Gave FODmap diet so she can eliminate potential IBS triggers if she does have additional bouts of IBS.        Nutrition Discharge: Rate Your Plate Scores: Nutrition Assessments - 04/28/17 1204      MEDFICTS Scores   Pre Score  3       Nutrition Goals Re-Evaluation: Nutrition Goals Re-Evaluation    Row Name 06/04/17 1442             Goals   Nutrition Goal  Avoid hard, crunchy foods and large portions of high fiber veggies and fruits. Watch sodium intake       Comment  [place]arius[place] of town but planned to watch her diet while she is away.         Expected Outcome  Short: Enjoy her vacation.  Long: Continue to follow heart healthy diet.           Nutrition Goals Discharge (Final Nutrition Goals Re-Evaluation): Nutrition Goals Re-Evaluation - 06/04/17 1442      Goals   Nutrition Goal  Avoid hard, crunchy foods and large portions of high fiber veggies and fruits. Watch sodium intake    Comment  [place]ori[place]airie[place] of town but planned to watch her diet while she is away.      Expected Outcome  Short: Enjoy her vacation.  Long: Continue to follow heart healthy diet.        Psychosocial: Target Goals: Acknowledge presence or absence of significant depression and/or stress, maximize coping skills, provide positive support system. Participant is able to verbalize types and ability to use techniques and skills needed for reducing stress and depression.   Initial Review & Psychosocial Screening: Initial Psych Review & Screening - 04/28/17 1206  Initial Review   Current issues with  Current Sleep Concerns Only sleeping 2-3 hours at a time. Has referral to MD for sleep study      Family Dynamics   Good Support System?  Yes Husband, son that lives at home, daughter and a sister lives in [place]       Barriers   Psychosocial barriers to participate in program  There are no identifiable barriers or psychosocial needs.;The patient should benefit from training in stress management and relaxation.      Screening Interventions   Interventions  Encouraged to exercise;Provide feedback about the scores to participant;To provide support and resources with identified psychosocial needs       Quality of Life Scores:  Quality of Life - 04/28/17 1211      Quality of Life Scores   Health/Function Pre  28.62 %    Socioeconomic Pre  30 %    Psych/Spiritual Pre  30 %    Family Pre  37.6 %    GLOBAL Pre  29.06 %       PHQ-9: Recent Review Flowsheet Data    Depression screen [facility] 2/9 04/28/2017   Decreased Interest 0   Down, Depressed, Hopeless 0   PHQ - 2 Score 0   Altered sleeping 0   Tired, decreased energy 0   Change in appetite 0   Feeling bad or failure about yourself  0   Trouble concentrating 0   Moving slowly or fidgety/restless 0   Suicidal thoughts 0   PHQ-9 Score 0   Difficult doing work/chores Not difficult at all     Interpretation of Total Score  Total Score Depression Severity:  1-4 = Minimal depression, 5-9 = Mild depression, 10-14 = Moderate depression, 15-19 = Moderately severe depression, 20-27 = Severe depression   Psychosocial Evaluation and Intervention: Psychosocial Evaluation - 05/07/17 0935      Psychosocial Evaluation & Interventions   Interventions  Encouraged to exercise with the program and follow exercise prescription;Stress management education    Comments  Counselor met with Ms. Wille Glaser Cecille Rubin) today for initial psychosocial evaluation.  She is a 56 year old who went into cardiac arrest on 8/21.  She has a strong support system  with a spouse of 21 years and an adult son who lives in the home as well.  Peityn states she sleeps "horribly" and had a sleep study last night.  She reports having used essential oils in the past week which was helpful and she got  almost 6 hours sleep those nights.  She will be researching this more.  Makinsley has a great appetite and denies a history of depression or anxiety.  She states her mood is typically very positive and she has minimal stress in her life other than her health and not being able to drive due to her condition until March, so her son is driving her back and forth to class and once she returns to work - will do the same.  Demitra has goals to increase her stamina and strength and to begin a habit of exercising.  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      Expected Outcomes  Rhylie will benefit from consistent exercise to achieve her stated goals.  The educational and psychoeducational components of this program will be helpful for her to learn more about her condition  and positive ways to manage this in her life.  She will see the ENT soon for her voice concerns and will update staff on the outcome of her sleep study.      Continue Psychosocial Services   Follow up required by staff       Psychosocial Re-Evaluation: Psychosocial Re-Evaluation    [place] Name 05/14/17 1012 06/04/17 1441           Psychosocial Re-Evaluation   Current issues with  Current Sleep Concerns  Current Stress Concerns      Comments  Counselor follow up with Cecille Rubin today reporting the outcome of her sleep study was that she has "insomnia" but she states the use of a diffuser with essential oils has improved her sleep to 5-6 hours/night vs. 2-3 hours - so she is pleased with that.  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  She continues to be in a positive mood and has been able to return to work part-time.  Counselor will continue to follow with her.    Candee will be out of town for the next two weeks.  She is visiting family in [place] and seeing her  granddaughter for the first time.  She was very excited to be able to go and to have the energy to be able to go!!      Expected Outcomes  Joelene will continue to exercise consistently and continue with her sleep regimen that is working.    Short: enjoy her vacation  Long: Return to rehab and attend regularly      Interventions  -  Encouraged to attend Cardiac Rehabilitation for the exercise;Stress management education      Continue Psychosocial Services   -  Follow up required by staff         Psychosocial Discharge (Final Psychosocial Re-Evaluation): Psychosocial Re-Evaluation - 06/04/17 1441      Psychosocial Re-Evaluation   Current issues with  Current Stress Concerns    Comments  Nyrie will be out of town for the next two weeks.  She is visiting family in [place] and seeing her granddaughter for the first time.  She was very excited to be able to go and to have the energy to be able to go!!    Expected Outcomes  Short: enjoy her vacation  Long: Return to rehab and attend regularly    Interventions  Encouraged to attend Cardiac Rehabilitation for the exercise;Stress management education    Continue Psychosocial Services   Follow up required by staff       Vocational Rehabilitation: Provide vocational rehab assistance to qualifying candidates.   Vocational Rehab Evaluation & Intervention: Vocational Rehab - 04/28/17 1213      Initial Vocational Rehab Evaluation & Intervention   Assessment shows need for Vocational Rehabilitation  No       Education: Education Goals: Education classes will be provided on a variety of topics geared toward better understanding of heart health and risk factor modification. Participant will state understanding/return demonstration of topics presented as noted by education test scores.  Learning Barriers/Preferences: Learning Barriers/Preferences - 04/28/17 1212      Learning Barriers/Preferences   Learning Barriers  None    Learning Preferences   Verbal Instruction       Education Topics: General Nutrition Guidelines/Fats and Fiber: -Group instruction provided by verbal, written material, models and posters to present the general guidelines for heart healthy nutrition. Gives  an explanation and review of dietary fats and fiber.   Controlling Sodium/Reading Food Labels: -Group verbal and written material supporting the discussion of sodium use in heart healthy nutrition. Review and explanation with models, verbal and written materials for utilization of the food label.   Exercise Physiology & Risk Factors: - Group verbal and written instruction with models to review the exercise physiology of the cardiovascular system and associated critical values. Details cardiovascular disease risk factors and the goals associated with each risk factor.   Cardiac Rehab from 05/21/2017 in [facility] Cardiac and Pulmonary Rehab  Date  05/14/17  Educator  [facility]  Instruction Review Code  1- Verbalizes Understanding      Aerobic Exercise & Resistance Training: - Gives group verbal and written discussion on the health impact of inactivity. On the components of aerobic and resistive training programs and the benefits of this training and how to safely progress through these programs.   Cardiac Rehab from 05/21/2017 in [facility] Cardiac and Pulmonary Rehab  Date  05/07/17  Educator  [facility]  Instruction Review Code  1- Verbalizes Understanding      Flexibility, Balance, General Exercise Guidelines: - Provides group verbal and written instruction on the benefits of flexibility and balance training programs. Provides general exercise guidelines with specific guidelines to those with heart or lung disease. Demonstration and skill practice provided.   Stress Management: - Provides group verbal and written instruction about the health risks of elevated stress, cause of high stress, and healthy ways to reduce stress.   Cardiac Rehab from 05/21/2017 in [facility] Cardiac and  Pulmonary Rehab  Date  05/21/17  Educator  [facility]  Instruction Review Code  1- Verbalizes Understanding      Depression: - Provides group verbal and written instruction on the correlation between heart/lung disease and depressed mood, treatment options, and the stigmas associated with seeking treatment.   Anatomy & Physiology of the Heart: - Group verbal and written instruction and models provide basic cardiac anatomy and physiology, with the coronary electrical and arterial systems. Review of: AMI, Angina, Valve disease, Heart Failure, Cardiac Arrhythmia, Pacemakers, and the ICD.   Cardiac Procedures: - Group verbal and written instruction to review commonly prescribed medications for heart disease. Reviews the medication, class of the drug, and side effects. Includes the steps to properly store meds and maintain the prescription regimen. (beta blockers and nitrates)   Cardiac Medications I: - Group verbal and written instruction to review commonly prescribed medications for heart disease. Reviews the medication, class of the drug, and side effects. Includes the steps to properly store meds and maintain the prescription regimen.   Cardiac Medications II: -Group verbal and written instruction to review commonly prescribed medications for heart disease. Reviews the medication, class of the drug, and side effects. (all other drug classes)    Go Sex-Intimacy & Heart Disease, Get SMART - Goal Setting: - Group verbal and written instruction through game format to discuss heart disease and the return to sexual intimacy. Provides group verbal and written material to discuss and apply goal setting through the application of the S.M.A.R.T. Method.   Other Matters of the Heart: - Provides group verbal, written materials and models to describe Heart Failure, Angina, Valve Disease, Peripheral Artery Disease, and Diabetes in the realm of heart disease. Includes description of the disease process and  treatment options available to the cardiac patient.   Exercise & Equipment Safety: - Individual verbal instruction and demonstration of equipment use and safety with use of  the equipment.   Cardiac Rehab from 05/21/2017 in [facility] Cardiac and Pulmonary Rehab  Date  04/28/17  Educator  SB  Instruction Review Code  1- Verbalizes Understanding      Infection Prevention: - Provides verbal and written material to individual with discussion of infection control including proper hand washing and proper equipment cleaning during exercise session.   Cardiac Rehab from 05/21/2017 in [facility] Cardiac and Pulmonary Rehab  Date  04/28/17  Educator  SB  Instruction Review Code  1- Verbalizes Understanding      Falls Prevention: - Provides verbal and written material to individual with discussion of falls prevention and safety.   Cardiac Rehab from 05/21/2017 in [facility] Cardiac and Pulmonary Rehab  Date  04/28/17  Educator  SB  Instruction Review Code  1- Verbalizes Understanding      Diabetes: - Individual verbal and written instruction to review signs/symptoms of diabetes, desired ranges of glucose level fasting, after meals and with exercise. Acknowledge that pre and post exercise glucose checks will be done for 3 sessions at entry of program.   Other: -Provides group and verbal instruction on various topics (see comments)    Knowledge Questionnaire Score: Knowledge Questionnaire Score - 04/28/17 1212      Knowledge Questionnaire Score   Pre Score  25/28 Reviewed corrcet responses with Cecille Rubin. She verbalized understanding of the response and had no further questions today.       Core Components/Risk Factors/Patient Goals at Admission: Personal Goals and Risk Factors at Admission - 04/28/17 1205      Core Components/Risk Factors/Patient Goals on Admission    Weight Management  Obesity;Weight Loss;Yes    Intervention  Weight Management: Develop a combined nutrition and exercise program  designed to reach desired caloric intake, while maintaining appropriate intake of nutrient and fiber, sodium and fats, and appropriate energy expenditure required for the weight goal.;Weight Management/Obesity: Establish reasonable short term and long term weight goals.    Admit Weight  181 lb (82.1 kg) lost 30 pounds while in the hospital    Goal Weight: Short Term  178 lb (80.7 kg)    Goal Weight: Long Term  151 lb (68.5 kg)    Expected Outcomes  Long Term: Adherence to nutrition and physical activity/exercise program aimed toward attainment of established weight goal;Weight Loss: Understanding of general recommendations for a balanced deficit meal plan, which promotes 1-2 lb weight loss per week and includes a negative energy balance of [id number] kcal/d;Understanding of distribution of calorie intake throughout the day with the consumption of 4-5 meals/snacks;Understanding recommendations for meals to include 15-35% energy as protein, 25-35% energy from fat, 35-60% energy from carbohydrates, less than 240m of dietary cholesterol, 20-35 gm of total fiber daily    Hypertension  Yes    Intervention  Provide education on lifestyle modifcations including regular physical activity/exercise, weight management, moderate sodium restriction and increased consumption of fresh fruit, vegetables, and low fat dairy, alcohol moderation, and smoking cessation.;Monitor prescription use compliance.    Expected Outcomes  Short Term: Continued assessment and intervention until BP is < 140/945mHG in hypertensive participants. < 130/8076mG in hypertensive participants with diabetes, heart failure or chronic kidney disease.;Long Term: Maintenance of blood pressure at goal levels.       Core Components/Risk Factors/Patient Goals Review:  Goals and Risk Factor Review    Row Name 06/04/17 1443             Core Components/Risk Factors/Patient Goals Review   Personal Goals Review  Weight Management/Obesity;Hypertension        Review  Delaina has been doing well in rehab.  She is currently out of town visiting family. She has reached her short term weight loss goal and hopes to maintain that even while away.  Her blood pressures have been good in rehab and she does check them some at home.        Expected Outcomes  Short: Continue to work on weight loss.  Long: Continue to monitor her risk factors.           Core Components/Risk Factors/Patient Goals at Discharge (Final Review):  Goals and Risk Factor Review - 06/04/17 1443      Core Components/Risk Factors/Patient Goals Review   Personal Goals Review  Weight Management/Obesity;Hypertension    Review  Suly has been doing well in rehab.  She is currently out of town visiting family. She has reached her short term weight loss goal and hopes to maintain that even while away.  Her blood pressures have been good in rehab and she does check them some at home.     Expected Outcomes  Short: Continue to work on weight loss.  Long: Continue to monitor her risk factors.        ITP Comments: ITP Comments    Row Name 04/28/17 1202 05/14/17 0631 06/04/17 1440 06/11/17 0558     ITP Comments   Medical review completed today.  Initial ITP created for review by Medical Director and changes as necessary.   Documentation of diagnosis can be found in [facility] Encounter 03/20/2017  30 day review. Continue with ITP unless directed changes per Medical Director review.  New to program  Larrie will be out of town for the next two weeks.  She is visiting family in [place] and seeing her granddaughter for the first time.  She was very excited to be able to go and to have the energy to be able to go!!  30 day review. Continue with ITP unless directed changes per Medical Director review.        Comments:

## 2017-06-13 DIAGNOSIS — Z79899 Other long term (current) drug therapy: Secondary | ICD-10-CM | POA: Diagnosis not present

## 2017-06-13 DIAGNOSIS — Z7982 Long term (current) use of aspirin: Secondary | ICD-10-CM | POA: Diagnosis not present

## 2017-06-13 DIAGNOSIS — I469 Cardiac arrest, cause unspecified: Secondary | ICD-10-CM

## 2017-06-13 DIAGNOSIS — I2510 Atherosclerotic heart disease of native coronary artery without angina pectoris: Secondary | ICD-10-CM | POA: Diagnosis not present

## 2017-06-13 DIAGNOSIS — I110 Hypertensive heart disease with heart failure: Secondary | ICD-10-CM | POA: Diagnosis not present

## 2017-06-13 DIAGNOSIS — I5022 Chronic systolic (congestive) heart failure: Secondary | ICD-10-CM | POA: Diagnosis not present

## 2017-06-13 DIAGNOSIS — I4901 Ventricular fibrillation: Secondary | ICD-10-CM | POA: Diagnosis not present

## 2017-06-13 NOTE — Progress Notes (Signed)
Daily Session Note  Patient Details  Name: Miranda [name] MRN: [id number] Date of Birth: 04-04-1961 Referring Provider:     Cardiac Rehab from 04/28/2017 in [facility] Cardiac and Pulmonary Rehab  Referring Provider  Ida Rogue MD      Encounter Date: 06/13/2017  Check In: Session Check In - 06/13/17 0924      Check-In   Location  [facility]-Cardiac & Pulmonary Rehab    Staff Present  Darel Hong, RN BSN;Jessica Luan Pulling, MA, ACSM RCEP, Exercise Physiologist;Amanda Oletta Darter, [place], ACSM CEP, Exercise Physiologist    Supervising physician immediately available to respond to emergencies  See telemetry face sheet for immediately available ER MD    Medication changes reported      No    Fall or balance concerns reported     No    Warm-up and Cool-down  Performed on first and last piece of equipment    Resistance Training Performed  Yes    VAD Patient?  No      Pain Assessment   Currently in Pain?  No/denies    Multiple Pain Sites  No          Social History   Tobacco Use  Smoking Status Never Smoker  Smokeless Tobacco Never Used    Goals Met:  Independence with exercise equipment Exercise tolerated well No report of cardiac concerns or symptoms Strength training completed today  Goals Unmet:  Not Applicable  Comments: Pt able to follow exercise prescription today without complaint.  Will continue to monitor for progression.    Dr. Emily Filbert is Medical Director for [place] and [facility].

## 2017-06-18 ENCOUNTER — Ambulatory Visit: Payer: BC Managed Care – PPO | Admitting: Internal Medicine

## 2017-06-18 DIAGNOSIS — I469 Cardiac arrest, cause unspecified: Secondary | ICD-10-CM

## 2017-06-18 NOTE — Progress Notes (Signed)
Daily Session Note  Patient Details  Name: Miranda [name] MRN: [id number] Date of Birth: 1961/04/24 Referring Provider:     Cardiac Rehab from 04/28/2017 in [facility] Cardiac and Pulmonary Rehab  Referring Provider  Ida Rogue MD      Encounter Date: 06/18/2017  Check In: Session Check In - 06/18/17 0729      Check-In   Location  [facility]-Cardiac & Pulmonary Rehab    Staff Present  Justin Mend Lorre Nick, MA, ACSM RCEP, Exercise Physiologist;Susanne Bice, RN, BSN, CCRP    Supervising physician immediately available to respond to emergencies  See telemetry face sheet for immediately available ER MD    Medication changes reported      No    Fall or balance concerns reported     No    Warm-up and Cool-down  Performed on first and last piece of equipment    Resistance Training Performed  Yes    VAD Patient?  No      Pain Assessment   Currently in Pain?  No/denies        Exercise Prescription Changes - 06/17/17 1400      Response to Exercise   Blood Pressure (Admit)  128/74    Blood Pressure (Exercise)  112/68    Blood Pressure (Exit)  130/74    Heart Rate (Admit)  56 bpm    Heart Rate (Exercise)  105 bpm    Heart Rate (Exit)  60 bpm    Rating of Perceived Exertion (Exercise)  12    Symptoms  none    Duration  Continue with 45 min of aerobic exercise without signs/symptoms of physical distress.    Intensity  THRR unchanged      Progression   Progression  Continue to progress workloads to maintain intensity without signs/symptoms of physical distress.    Average METs  4.27      Resistance Training   Training Prescription  Yes    Weight  4 lbs    Reps  10-15      Interval Training   Interval Training  No      Treadmill   MPH  3    Grade  1    Minutes  15    METs  3.71      NuStep   Level  4    Minutes  15    METs  3.3      REL-XR   Level  5    Minutes  15    METs  5.8      Home Exercise Plan   Plans to continue exercise at  Home (comment)  walking and gym in apartment complex    Frequency  Add 2 additional days to program exercise sessions.    Initial Home Exercises Provided  05/09/17       Social History   Tobacco Use  Smoking Status Never Smoker  Smokeless Tobacco Never Used    Goals Met:  Independence with exercise equipment Exercise tolerated well Personal goals reviewed No report of cardiac concerns or symptoms Strength training completed today  Goals Unmet:  Not Applicable  Comments: Pt able to follow exercise prescription today without complaint.  Will continue to monitor for progression.   Dr. Emily Filbert is Medical Director for [place] and [facility].

## 2017-06-19 ENCOUNTER — Ambulatory Visit (INDEPENDENT_AMBULATORY_CARE_PROVIDER_SITE_OTHER): Payer: BC Managed Care – PPO | Admitting: Internal Medicine

## 2017-06-19 ENCOUNTER — Ambulatory Visit: Payer: BC Managed Care – PPO | Admitting: Speech Pathology

## 2017-06-19 ENCOUNTER — Encounter: Payer: Self-pay | Admitting: Internal Medicine

## 2017-06-19 VITALS — BP 106/70 | HR 48 | Ht 60.0 in | Wt 177.5 lb

## 2017-06-19 DIAGNOSIS — I469 Cardiac arrest, cause unspecified: Secondary | ICD-10-CM | POA: Diagnosis not present

## 2017-06-19 DIAGNOSIS — I495 Sick sinus syndrome: Secondary | ICD-10-CM | POA: Diagnosis not present

## 2017-06-19 DIAGNOSIS — Z95810 Presence of automatic (implantable) cardiac defibrillator: Secondary | ICD-10-CM | POA: Diagnosis not present

## 2017-06-19 NOTE — Progress Notes (Signed)
Patient Care Team: Soles, Willaim Rayas, MD as PCP - General (Family Medicine) Mariah Milling Tollie Pizza, MD as Consulting Physician (Cardiology) Merwyn Katos, MD as Consulting Physician (Pulmonary Disease)   HPI  Miranda [name] is a 56 y.o. 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  Evaluation at that time included an MRI showing mild epicardial gadolinium uptake in the distal anterior wall and inferior base she had interval normalization of LV function and QT intervals.   She is back at work without chest pain or shortness of breath.  She has had no interval syncope.  Date Cr K  10/18 0.67 4.4            Past Medical History:  Diagnosis Date  . Coronary artery disease, non-occlusive    a. LHC 02/25/2017: no angiographically significant CAD, EF <35%, moderately elevated LVEDP at 28 mmHg.  Marland Kitchen Dependent edema    bilateral legs  . Hot flashes   . HTN (hypertension)   . Hypokalemia   . IBS (irritable bowel syndrome)   . Idiopathic Ventricular fibrillation (HCC)    a. 02/25/2017: LHC without signficant CAD; b. possibly 2/2 diarrhea with electrolyte abnormalities/Imodium usage - K 2.7; c. s/p Medtronic MRI compatible dual chamber AICD, serial number [id number].  . Migraines   . Nausea   . NICM w/ recovery of LV dysfxn    a. TTE 02/26/2017: EF < 20%, diffuse HK, mild AI, RV sys fxn mildly reduced; b. TTE 03/03/2017: EF 55-60%, nl WM, mild AI, PASP 46    Past Surgical History:  Procedure Laterality Date  . APPENDECTOMY  1974  . CHOLECYSTECTOMY  1984  . COLONOSCOPY WITH PROPOFOL N/A 05/22/2017   Procedure: COLONOSCOPY WITH PROPOFOL;  Surgeon: Wyline Mood, MD;  Location: [facility] ENDOSCOPY;  Service: Gastroenterology;  Laterality: N/A;  . ICD IMPLANT N/A 03/17/2017   Procedure: ICD Implant;  Surgeon: Duke Salvia, MD;  Location: [facility] INVASIVE CV LAB;  Service: Cardiovascular;  Laterality: N/A;  . LEFT HEART CATH AND CORONARY  ANGIOGRAPHY N/A 02/26/2017   Procedure: LEFT HEART CATH AND CORONARY ANGIOGRAPHY;  Surgeon: Yvonne Kendall, MD;  Location: [facility] INVASIVE CV LAB;  Service: Cardiovascular;  Laterality: N/A;  . TONSILLECTOMY  1964    Current Outpatient Medications  Medication Sig Dispense Refill  . aspirin EC 81 MG EC tablet Take 1 tablet (81 mg total) by mouth daily.    . carvedilol (COREG) 3.125 MG tablet Take 1 tablet (3.125 mg total) by mouth 2 (two) times daily with a meal. 180 tablet 3  . lipase/protease/amylase (CREON) 36000 UNITS CPEP capsule Take 2 capsules (72,000 Units total) 3 (three) times daily before meals by mouth. 90 capsule 3  . potassium chloride SA (K-DUR,KLOR-CON) 20 MEQ tablet Take 1 tablet (20 mEq total) by mouth daily. 90 tablet 3   No current facility-administered medications for this visit.     Allergies  Allergen Reactions  . Ace Inhibitors   . Compazine [Prochlorperazine Edisylate]   . Lisinopril   . Penicillins     Has patient had a PCN reaction causing immediate rash, facial/tongue/throat swelling, SOB or lightheadedness with hypotension: Unknown Has patient had a PCN reaction causing severe rash involving mucus membranes or skin necrosis: Unknown Has patient had a PCN reaction that required hospitalization: Unknown Has patient had a PCN reaction occurring within the last 10 years: Unknown If all of the above answers are "NO", then may proceed with Cephalosporin  use.       Review of Systems negative except from HPI and PMH  Physical Exam BP 106/70 (BP Location: Left Arm, Patient Position: Sitting, Cuff Size: Large)   Pulse (!) 48   Ht 5' (1.524 m)   Wt 177 lb 8 oz (80.5 kg)   BMI 34.67 kg/m  Well developed and well nourished in no acute distress HENT normal E scleral and icterus clear Neck Supple JVP flat; carotids brisk and full Clear to ausculation Device pocket well healed; without hematoma or erythema.  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  Bradycardia is not symptomatic at this point; we will continue carvedilol.  Reviewed response to ICD shocks.  Resume driving 1/613/19   Current medicines are reviewed at length with the patient today .  The patient does not  have concerns regarding medicines.

## 2017-06-19 NOTE — Patient Instructions (Signed)
Medication Instructions: - Your physician recommends that you continue on your current medications as directed. Please refer to the Current Medication list given to you today.  Labwork: - none ordered  Procedures/Testing: - none ordered  Follow-Up: - Remote monitoring is used to monitor your Pacemaker of ICD from home. This monitoring reduces the number of office visits required to check your device to one time per year. It allows [place] to keep an eye on the functioning of your device to ensure it is working properly. You are scheduled for a device check from home on 09/18/17. You may send your transmission at any time that day. If you have a wireless device, the transmission will be sent automatically. After your physician reviews your transmission, you will receive a postcard with your next transmission date.  - Your physician wants you to follow-up in: 9 months with Dr. Graciela Husbands. You will receive a reminder letter in the mail two months in advance. If you don't receive a letter, please call our office to schedule the follow-up appointment.   Any Additional Special Instructions Will Be Listed Below (If Applicable).     If you need a refill on your cardiac medications before your next appointment, please call your pharmacy.

## 2017-06-20 ENCOUNTER — Encounter: Payer: BC Managed Care – PPO | Admitting: *Deleted

## 2017-06-20 DIAGNOSIS — I469 Cardiac arrest, cause unspecified: Secondary | ICD-10-CM

## 2017-06-20 NOTE — Progress Notes (Signed)
Daily Session Note  Patient Details  Name: Miranda [name] MRN: [id number] Date of Birth: Dec 19, 1960 Referring Provider:     Cardiac Rehab from 04/28/2017 in [facility] Cardiac and Pulmonary Rehab  Referring Provider  Ida Rogue MD      Encounter Date: 06/20/2017  Check In: Session Check In - 06/20/17 1042      Check-In   Location  [facility]-Cardiac & Pulmonary Rehab    Staff Present  Renita Papa, RN Vickki Hearing, BA, ACSM CEP, Exercise Physiologist;Sheretta Grumbine Luan Pulling, [place], ACSM RCEP, Exercise Physiologist    Supervising physician immediately available to respond to emergencies  See telemetry face sheet for immediately available ER MD    Medication changes reported      No    Fall or balance concerns reported     No    Warm-up and Cool-down  Performed on first and last piece of equipment    Resistance Training Performed  Yes    VAD Patient?  No      Pain Assessment   Currently in Pain?  No/denies          Social History   Tobacco Use  Smoking Status Never Smoker  Smokeless Tobacco Never Used    Goals Met:  Independence with exercise equipment Exercise tolerated well No report of cardiac concerns or symptoms Strength training completed today  Goals Unmet:  Not Applicable  Comments: Pt able to follow exercise prescription today without complaint.  Will continue to monitor for progression.    Dr. Emily Filbert is Medical Director for [place] and [facility].

## 2017-06-25 ENCOUNTER — Ambulatory Visit: Payer: BC Managed Care – PPO | Admitting: Speech Pathology

## 2017-06-25 DIAGNOSIS — I469 Cardiac arrest, cause unspecified: Secondary | ICD-10-CM

## 2017-06-25 NOTE — Progress Notes (Signed)
Daily Session Note  Patient Details  Name: Miranda [name] MRN: [id number] Date of Birth: 01/22/1961 Referring Provider:     Cardiac Rehab from 04/28/2017 in [facility] Cardiac and Pulmonary Rehab  Referring Provider  Gollan, Timothy MD      Encounter Date: 06/25/2017  Check In: Session Check In - 06/25/17 0722      Check-In   Location  [facility]-Cardiac & Pulmonary Rehab    Staff Present    RCP,RRT,BSRT;Susanne Bice, RN, BSN, CCRP;Jessica Hawkins, MA, ACSM RCEP, Exercise Physiologist    Supervising physician immediately available to respond to emergencies  See telemetry face sheet for immediately available ER MD    Medication changes reported      No    Fall or balance concerns reported     No    Warm-up and Cool-down  Performed on first and last piece of equipment    Resistance Training Performed  Yes    VAD Patient?  No      Pain Assessment   Currently in Pain?  No/denies          Social History   Tobacco Use  Smoking Status Never Smoker  Smokeless Tobacco Never Used    Goals Met:  Independence with exercise equipment Exercise tolerated well No report of cardiac concerns or symptoms Strength training completed today  Goals Unmet:  Not Applicable  Comments: Pt able to follow exercise prescription today without complaint.  Will continue to monitor for progression.   Dr. Mark Miller is Medical Director for [facility] and [facility]. 

## 2017-06-27 ENCOUNTER — Encounter: Payer: Self-pay | Admitting: Speech Pathology

## 2017-06-27 ENCOUNTER — Ambulatory Visit: Payer: BC Managed Care – PPO | Admitting: Speech Pathology

## 2017-06-27 ENCOUNTER — Ambulatory Visit: Payer: BC Managed Care – PPO | Attending: Unknown Physician Specialty | Admitting: Speech Pathology

## 2017-06-27 ENCOUNTER — Other Ambulatory Visit: Payer: Self-pay

## 2017-06-27 DIAGNOSIS — R490 Dysphonia: Secondary | ICD-10-CM | POA: Insufficient documentation

## 2017-06-27 DIAGNOSIS — I469 Cardiac arrest, cause unspecified: Secondary | ICD-10-CM

## 2017-06-27 NOTE — Progress Notes (Signed)
Daily Session Note  Patient Details  Name: Jazmarie Biever MRN: [id number] Date of Birth: [birth year] Referring Provider:     Cardiac Rehab from 04/28/2017 in [facility] Cardiac and Pulmonary Rehab  Referring Provider  Ida Rogue MD      Encounter Date: 06/27/2017  Check In: Session Check In - 06/27/17 0843      Check-In   Location  [facility]-Cardiac & Pulmonary Rehab    Staff Present  Renita Papa, RN Vickki Hearing, BA, ACSM CEP, Exercise Physiologist;Jessica Luan Pulling, MA, ACSM RCEP, Exercise Physiologist    Supervising physician immediately available to respond to emergencies  See telemetry face sheet for immediately available ER MD    Medication changes reported      No    Fall or balance concerns reported     No    Warm-up and Cool-down  Performed on first and last piece of equipment    Resistance Training Performed  Yes    VAD Patient?  No      Pain Assessment   Currently in Pain?  No/denies          Social History   Tobacco Use  Smoking Status Never Smoker  Smokeless Tobacco Never Used    Goals Met:  Independence with exercise equipment Exercise tolerated well No report of cardiac concerns or symptoms Strength training completed today  Goals Unmet:  Not Applicable  Comments:  Pt able to follow exercise prescription today without complaint.  Will continue to monitor for progression.       Dr. Emily Filbert is Medical Director for [place] and [facility].

## 2017-06-27 NOTE — Therapy (Signed)
[facility] MAIN [facility] SERVICES [address] [place], [place], [address] Phone: [phone]   Fax:  [phone]  Speech Language Pathology Treatment  Patient Details  Name: Miranda [name] MRN: [id number] Date of Birth: 03-16-1961 Referring Provider: Dr. Jenne Campus   Encounter Date: 06/27/2017  End of Session - 06/27/17 1419    Visit Number  2    Number of Visits  17    Date for SLP Re-Evaluation  07/21/17    SLP Start Time  1000    SLP Stop Time   1045    SLP Time Calculation (min)  45 min    Activity Tolerance  Patient tolerated treatment well       Past Medical History:  Diagnosis Date  . Coronary artery disease, non-occlusive    a. LHC 02/25/2017: no angiographically significant CAD, EF <35%, moderately elevated LVEDP at 28 mmHg.  Marland Kitchen Dependent edema    bilateral legs  . Hot flashes   . HTN (hypertension)   . Hypokalemia   . IBS (irritable bowel syndrome)   . Idiopathic Ventricular fibrillation (HCC)    a. 02/25/2017: LHC without signficant CAD; b. possibly 2/2 diarrhea with electrolyte abnormalities/Imodium usage - K 2.7; c. s/p Medtronic MRI compatible dual chamber AICD, serial number [id number].  . Migraines   . Nausea   . NICM w/ recovery of LV dysfxn    a. TTE 02/26/2017: EF < 20%, diffuse HK, mild AI, RV sys fxn mildly reduced; b. TTE 03/03/2017: EF 55-60%, nl WM, mild AI, PASP 46    Past Surgical History:  Procedure Laterality Date  . APPENDECTOMY  1974  . CHOLECYSTECTOMY  1984  . COLONOSCOPY WITH PROPOFOL N/A 05/22/2017   Procedure: COLONOSCOPY WITH PROPOFOL;  Surgeon: Wyline Mood, MD;  Location: [facility] ENDOSCOPY;  Service: Gastroenterology;  Laterality: N/A;  . ICD IMPLANT N/A 03/17/2017   Procedure: ICD Implant;  Surgeon: Duke Salvia, MD;  Location: [facility] INVASIVE CV LAB;  Service: Cardiovascular;  Laterality: N/A;  . LEFT HEART CATH AND CORONARY ANGIOGRAPHY N/A 02/26/2017   Procedure: LEFT HEART CATH AND CORONARY ANGIOGRAPHY;   Surgeon: Yvonne Kendall, MD;  Location: [facility] INVASIVE CV LAB;  Service: Cardiovascular;  Laterality: N/A;  . TONSILLECTOMY  1964    There were no vitals filed for this visit.  Subjective Assessment - 06/27/17 1418    Subjective  Patient reports that her voice is better overall but she is currently experiencing allergy flare up    Currently in Pain?  No/denies            ADULT SLP TREATMENT - 06/27/17 0001      General Information   Behavior/Cognition  Alert;Cooperative;Pleasant mood    HPI  56 year old woman, with history of lengthy and emergent intubation (August 2018), referred by Dr. Jenne Campus for voice therapy.  Per report, abnormal laryngeal findings include: mild bowing of the vocal cords.        Treatment Provided   Treatment provided  Cognitive-Linquistic      Pain Assessment   Pain Assessment  No/denies pain      Cognitive-Linquistic Treatment   Treatment focused on  Voice    Skilled Treatment  The patient was provided with written and verbal teaching regarding breath support exercises.  She demonstrates improved abdominal/diaphragmatic breathing.  Patient instructed in relaxed phonation / oral resonance. Improved oral resonance with semi-occluded phonation strategies.  Patient is able to maintain clear vocal quality / oral resonance in initial /w/ words and  phrases, automatic speech series, and single words with overall 50% accuracy.  Her control was inconsistent, possibly due to current flare up of allergies.      Assessment / Recommendations / Plan   Plan  Continue with current plan of care      Progression Toward Goals   Progression toward goals  Progressing toward goals       SLP Education - 06/27/17 1418    Education provided  Yes    Education Details  semi occluded voice, vocal loudness    Person(s) Educated  Patient    Methods  Explanation    Comprehension  Verbalized understanding         SLP Long Term Goals - 05/21/17 1235      SLP LONG TERM  GOAL #1   Title  The patient will demonstrate independent understanding of extrinsic laryngeal muscle stretches and breath support exercises.      Time  8    Period  Weeks    Status  New    Target Date  07/21/17      SLP LONG TERM GOAL #2   Title  The patient will minimize vocal tension via resonant voice therapy (or comparable technique) with min SLP cues with 80% accuracy.    Time  8    Period  Weeks    Status  New    Target Date  07/21/17      SLP LONG TERM GOAL #3   Title  The patient will maximize voice quality and loudness using breath support/oral resonance for paragraph length recitation with 80% accuracy.    Time  8    Period  Weeks    Status  New    Target Date  07/21/17       Plan - 06/27/17 1419    Clinical Impression Statement  Patient able to improve vocal quality with semi-occluded strategies to improve oral resonance and vocal loudness to decrease laryngeal strain.  She had inconsistent success today, possibly due to current flare up of allergies.    Speech Therapy Frequency  2x / week    Duration  Other (comment)    Treatment/Interventions  SLP instruction and feedback;Patient/family education;Other (comment) Voice therapy    Potential to Achieve Goals  Good    Potential Considerations  Ability to learn/carryover information;Co-morbidities;Cooperation/participation level;Medical prognosis;Previous level of function;Severity of impairments;Family/community support    SLP Home Exercise Plan  neck, shoulder, tongue, and throat stretches; breath support exercises; semi-occluded voice    Consulted and Agree with Plan of Care  Patient       Patient will benefit from skilled therapeutic intervention in order to improve the following deficits and impairments:   Dysphonia    Problem List Patient Active Problem List   Diagnosis Date Noted  . Acute blood loss anemia   . Hypoalbuminemia due to protein-calorie malnutrition (HCC)   . Benign essential HTN   . Debility    . AKI (acute kidney injury) (HCC) 03/14/2017  . Diarrhea 03/14/2017  . Bradycardia   . Cardiac arrest with ventricular fibrillation (HCC) 02/26/2017  . Cardiac arrest (HCC) 02/26/2017  . Acute respiratory failure (HCC)   . Hypokalemia   . Encounter for central line placement   . Cardiogenic shock (HCC)   . Acute pulmonary edema (HCC)   . Anoxic encephalopathy (HCC)   . Hypertension 08/16/2016  . Edema 08/16/2016  . Nausea 08/16/2016  . Class 2 obesity due to excess calories without serious comorbidity with body mass  index (BMI) of 35.0 to 35.9 in adult 08/16/2016  . Open-angle glaucoma 08/13/2012  . Encounter for routine gynecological examination 08/23/2011  . Cortical senile cataract 11/02/2010  . Vitreous degeneration 11/02/2010  . Borderline glaucoma, open angle with borderline findings 11/01/2010  . Myopia 11/01/2010  . Presbyopia 11/01/2010   Dollene PrimroseSusan G Lirio Bach, MS/CCC- SLP  Leandrew KoyanagiAbernathy, Susie 06/27/2017, 2:21 PM  [facility] MAIN [facility] [address] [place], [place], [address] Phone: [phone]   Fax:  [phone]   Name: Miranda LinkLori [name] MRN: [id number] Date of Birth: May 29, 1961

## 2017-07-02 ENCOUNTER — Encounter: Payer: BC Managed Care – PPO | Admitting: *Deleted

## 2017-07-02 DIAGNOSIS — I469 Cardiac arrest, cause unspecified: Secondary | ICD-10-CM | POA: Diagnosis not present

## 2017-07-02 NOTE — Progress Notes (Signed)
Daily Session Note  Patient Details  Name: Miranda [name] MRN: [id number] Date of Birth: 1960/10/20 Referring Provider:     Cardiac Rehab from 04/28/2017 in [facility] Cardiac [facility] Pulmonary Rehab  Referring Provider  Ida Rogue MD      Encounter Date: 07/02/2017  Check In: Session Check In - 07/02/17 0743      Check-In   Location  [facility]-Cardiac & Pulmonary Rehab    Staff Present  Heath Lark, RN, BSN, CCRP;Jessica Luan Pulling, MA, ACSM RCEP, Exercise Physiologist;Other Joellyn Rued, BS, [place]     Supervising physician immediately available to respond to emergencies  See telemetry face sheet for immediately available ER MD    Medication changes reported      No    Fall or balance concerns reported     No    Warm-up and Cool-down  Performed on first and last piece of equipment    Resistance Training Performed  Yes    VAD Patient?  No      Pain Assessment   Currently in Pain?  No/denies    Multiple Pain Sites  No          Social History   Tobacco Use  Smoking Status Never Smoker  Smokeless Tobacco Never Used    Goals Met:  Independence with exercise equipment Exercise tolerated well No report of cardiac concerns or symptoms Strength training completed today  Goals Unmet:  Not Applicable  Comments: Pt able to follow exercise prescription today without complaint.  Will continue to monitor for progression.    Dr. Emily Filbert is Medical Director for [facility].

## 2017-07-09 ENCOUNTER — Encounter: Payer: Self-pay | Admitting: *Deleted

## 2017-07-09 ENCOUNTER — Encounter: Payer: BC Managed Care – PPO | Attending: Cardiovascular Disease

## 2017-07-09 DIAGNOSIS — I469 Cardiac arrest, cause unspecified: Secondary | ICD-10-CM

## 2017-07-09 DIAGNOSIS — I5022 Chronic systolic (congestive) heart failure: Secondary | ICD-10-CM | POA: Insufficient documentation

## 2017-07-09 DIAGNOSIS — Z7982 Long term (current) use of aspirin: Secondary | ICD-10-CM | POA: Insufficient documentation

## 2017-07-09 DIAGNOSIS — I4901 Ventricular fibrillation: Secondary | ICD-10-CM | POA: Insufficient documentation

## 2017-07-09 DIAGNOSIS — Z79899 Other long term (current) drug therapy: Secondary | ICD-10-CM | POA: Insufficient documentation

## 2017-07-09 DIAGNOSIS — I2510 Atherosclerotic heart disease of native coronary artery without angina pectoris: Secondary | ICD-10-CM | POA: Insufficient documentation

## 2017-07-09 DIAGNOSIS — I110 Hypertensive heart disease with heart failure: Secondary | ICD-10-CM | POA: Insufficient documentation

## 2017-07-09 NOTE — Progress Notes (Signed)
Cardiac Individual Treatment Plan  Patient Details  Name: Miranda [name] MRN: [id number] Date of Birth: 06/19/1961 Referring Provider:     Cardiac Rehab from 04/28/2017 in [facility] Cardiac and Pulmonary Rehab  Referring Provider  Ida Rogue MD      Initial Encounter Date:    Cardiac Rehab from 04/28/2017 in [facility] Cardiac and Pulmonary Rehab  Date  04/28/17  Referring Provider  Ida Rogue MD      Visit Diagnosis: Cardiac arrest [facility])  Patient's Home Medications on Admission:  Current Outpatient Medications:  .  aspirin EC 81 MG EC tablet, Take 1 tablet (81 mg total) by mouth daily., Disp: , Rfl:  .  carvedilol (COREG) 3.125 MG tablet, Take 1 tablet (3.125 mg total) by mouth 2 (two) times daily with a meal., Disp: 180 tablet, Rfl: 3 .  lipase/protease/amylase (CREON) 36000 UNITS CPEP capsule, Take 2 capsules (72,000 Units total) 3 (three) times daily before meals by mouth., Disp: 90 capsule, Rfl: 3 .  potassium chloride SA (K-DUR,KLOR-CON) 20 MEQ tablet, Take 1 tablet (20 mEq total) by mouth daily., Disp: 90 tablet, Rfl: 3  Past Medical History: Past Medical History:  Diagnosis Date  . Coronary artery disease, non-occlusive    a. LHC 02/25/2017: no angiographically significant CAD, EF <35%, moderately elevated LVEDP at 28 mmHg.  Marland Kitchen Dependent edema    bilateral legs  . Hot flashes   . HTN (hypertension)   . Hypokalemia   . IBS (irritable bowel syndrome)   . 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 02/25/2017: LHC without signficant CAD; b. possibly 2/2 diarrhea with electrolyte abnormalities/Imodium usage - K 2.7; c. s/p Medtronic MRI compatible dual chamber AICD, serial number [id number].  . Migraines   . Nausea   . NICM w/ recovery of LV dysfxn    a. TTE 02/26/2017: EF < 20%, diffuse HK, mild AI, RV sys fxn mildly reduced; b. TTE 03/03/2017: EF 55-60%, nl WM, mild AI, PASP 46    Tobacco Use: Social History   Tobacco Use  Smoking Status Never Smoker  Smokeless  Tobacco Never Used    Labs: Recent Review Flowsheet Data    Labs for ITP Cardiac and Pulmonary Rehab Latest Ref Rng & Units 03/06/2017 03/07/2017 03/09/2017 03/10/2017 03/15/2017   Trlycerides <150 mg/dL 329(H) 262(H) - 119 -   Hemoglobin A1c 4.8 - 5.6 % - - - - 7.6(H)   PHART 7.350 - 7.450 7.48(H) - 7.51(H) - -   PCO2ART 32.0 - 48.0 mmHg 37 - 37 - -   HCO3 20.0 - 28.0 mmol/L 27.6 - 29.5(H) - -   ACIDBASEDEF 0.0 - 2.0 mmol/L - - - - -   O2SAT % 98.8 - 97.8 - -       Exercise Target Goals:    Exercise Program Goal: Individual exercise prescription set with THRR, safety & activity barriers. Participant demonstrates ability to understand and report RPE using BORG scale, to self-measure pulse accurately, and to acknowledge the importance of the exercise prescription.  Exercise Prescription Goal: Starting with aerobic activity 30 plus minutes a day, 3 days per week for initial exercise prescription. Provide home exercise prescription and guidelines that participant acknowledges understanding prior to discharge.  Activity Barriers & Risk Stratification: Activity Barriers & Cardiac Risk Stratification - 04/28/17 1244      Activity Barriers & Cardiac Risk Stratification   Activity Barriers  Deconditioning;Muscular Weakness;Decreased Ventricular Function    Cardiac Risk Stratification  High       6 Minute  Walk: 6 Minute Walk    Row Name 04/28/17 1242         6 Minute Walk   Distance  1525 feet     Walk Time  6 minutes     # of Rest Breaks  0     MPH  2.8     METS  3.5     RPE  12     Perceived Dyspnea   0     VO2 Peak  12.59     Symptoms  No     Resting HR  64 bpm     Resting BP  116/70     Resting Oxygen Saturation   100 %     Exercise Oxygen Saturation  during 6 min walk  97 %     Max Ex. HR  98 bpm     Max Ex. BP  146/84     2 Minute Post BP  134/72        Oxygen Initial Assessment:   Oxygen Re-Evaluation:   Oxygen Discharge (Final Oxygen  Re-Evaluation):   Initial Exercise Prescription: Initial Exercise Prescription - 04/28/17 1200      Date of Initial Exercise RX and Referring Provider   Date  04/28/17    Referring Provider  Ida Rogue MD      Treadmill   MPH  2.8    Grade  1    Minutes  15    METs  3.5      NuStep   Level  2    SPM  80    Minutes  15    METs  3      REL-XR   Level  2    Watts  50    Speed  2.8    Minutes  15    METs  3      Prescription Details   Frequency (times per week)  2    Duration  Progress to 45 minutes of aerobic exercise without signs/symptoms of physical distress      Intensity   THRR 40-80% of Max Heartrate  65-130    Ratings of Perceived Exertion  11-13    Perceived Dyspnea  0-4      Progression   Progression  Continue progressive overload as per policy without signs/symptoms or physical distress.      Resistance Training   Training Prescription  Yes    Weight  4    Reps  10-15       Perform Capillary Blood Glucose checks as needed.  Exercise Prescription Changes: Exercise Prescription Changes    Row Name 04/28/17 1200 05/07/17 1500 05/09/17 0800 05/20/17 1400 06/04/17 1400     Response to Exercise   Blood Pressure (Admit)  116/70  124/70  -  124/64  106/68   Blood Pressure (Exercise)  146/84  130/76  -  142/88  132/70   Blood Pressure (Exit)  134/72  124/72  -  122/84  122/66   Heart Rate (Admit)  64 bpm  73 bpm  -  63 bpm  84 bpm   Heart Rate (Exercise)  98 bpm  104 bpm  -  107 bpm  106 bpm   Heart Rate (Exit)  68 bpm  85 bpm  -  75 bpm  67 bpm   Oxygen Saturation (Admit)  100 %  -  -  -  -   Oxygen Saturation (Exercise)  97 %  -  -  -  -  Oxygen Saturation (Exit)  97 %  -  -  -  -   Rating of Perceived Exertion (Exercise)  12  12  -  11  12   Perceived Dyspnea (Exercise)  0  -  -  -  -   Symptoms  None  none  -  none  none   Comments  walk test completed   second full day of exercise  -  -  -   Duration  Progress to 45 minutes of aerobic  exercise without signs/symptoms of physical distress  Progress to 45 minutes of aerobic exercise without signs/symptoms of physical distress  -  Continue with 45 min of aerobic exercise without signs/symptoms of physical distress.  Continue with 45 min of aerobic exercise without signs/symptoms of physical distress.   Intensity  THRR New  THRR unchanged  -  THRR unchanged  THRR unchanged     Progression   Progression  -  Continue to progress workloads to maintain intensity without signs/symptoms of physical distress.  -  Continue to progress workloads to maintain intensity without signs/symptoms of physical distress.  Continue to progress workloads to maintain intensity without signs/symptoms of physical distress.   Average METs  -  3.51  -  4.37  4.34     Resistance Training   Training Prescription  -  Yes  -  Yes  Yes   Weight  -  4 lbs  -  4 lbs  4 lbs   Reps  -  10-15  -  10-15  10-15     Interval Training   Interval Training  -  No  -  No  No     Treadmill   MPH  -  2.8  -  3  3   Grade  -  1  -  1  1   Minutes  -  15  -  15  15   METs  -  3.5  -  3.71  3.71     NuStep   Level  -  2  -  4  4   Minutes  -  15  -  15  15   METs  -  2.7  -  5.9  3.2     REL-XR   Level  -  2  -  4  4   Speed  -  50  -  -  -   Minutes  -  15  -  15  15   METs  -  4.3  -  3.5  6.1     Home Exercise Plan   Plans to continue exercise at  -  -  Home (comment) walking and gym in apartment complex  Home (comment) walking and gym in apartment complex  Home (comment) walking and gym in apartment complex   Frequency  -  -  Add 2 additional days to program exercise sessions.  Add 2 additional days to program exercise sessions.  Add 2 additional days to program exercise sessions.   Initial Home Exercises Provided  -  -  05/09/17  05/09/17  05/09/17   Row Name 06/17/17 1400 07/02/17 1600           Response to Exercise   Blood Pressure (Admit)  128/74  124/60      Blood Pressure (Exercise)  112/68   142/88      Blood Pressure (Exit)  130/74  110/60  Heart Rate (Admit)  56 bpm  49 bpm      Heart Rate (Exercise)  105 bpm  97 bpm      Heart Rate (Exit)  60 bpm  55 bpm      Rating of Perceived Exertion (Exercise)  12  11      Symptoms  none  none      Duration  Continue with 45 min of aerobic exercise without signs/symptoms of physical distress.  Continue with 45 min of aerobic exercise without signs/symptoms of physical distress.      Intensity  THRR unchanged  THRR unchanged        Progression   Progression  Continue to progress workloads to maintain intensity without signs/symptoms of physical distress.  Continue to progress workloads to maintain intensity without signs/symptoms of physical distress.      Average METs  4.27  4.67        Resistance Training   Training Prescription  Yes  Yes      Weight  4 lbs  4 lbs      Reps  10-15  10-15        Interval Training   Interval Training  No  No        Treadmill   MPH  3  3      Grade  1  2      Minutes  15  15      METs  3.71  4.12        NuStep   Level  4  4      Minutes  15  15      METs  3.3  3.5        REL-XR   Level  5  6      Minutes  15  15      METs  5.8  6.4        Home Exercise Plan   Plans to continue exercise at  Home (comment) walking and gym in apartment complex  Home (comment) walking and gym in apartment complex      Frequency  Add 2 additional days to program exercise sessions.  Add 2 additional days to program exercise sessions.      Initial Home Exercises Provided  05/09/17  05/09/17         Exercise Comments: Exercise Comments    Row Name 04/30/17 0741           Exercise Comments  First full day of exercise!  Patient was oriented to gym and equipment including functions, settings, policies, and procedures.  Patient's individual exercise prescription and treatment plan were reviewed.  All starting workloads were established based on the results of the 6 minute walk test done at initial  orientation visit.  The plan for exercise progression was also introduced and progression will be customized based on patient's performance and goals.          Exercise Goals and Review: Exercise Goals    Row Name 04/28/17 1253             Exercise Goals   Increase Physical Activity  Yes       Intervention  Provide advice, education, support and counseling about physical activity/exercise needs.;Develop an individualized exercise prescription for aerobic and resistive training based on initial evaluation findings, risk stratification, comorbidities and participant's personal goals.       Expected Outcomes  Achievement of increased cardiorespiratory fitness and enhanced flexibility, muscular endurance  and strength shown through measurements of functional capacity and personal statement of participant.       Increase Strength and Stamina  Yes       Intervention  Provide advice, education, support and counseling about physical activity/exercise needs.;Develop an individualized exercise prescription for aerobic and resistive training based on initial evaluation findings, risk stratification, comorbidities and participant's personal goals.       Expected Outcomes  Achievement of increased cardiorespiratory fitness and enhanced flexibility, muscular endurance and strength shown through measurements of functional capacity and personal statement of participant.       Able to understand and use rate of perceived exertion (RPE) scale  Yes       Intervention  Provide education and explanation on how to use RPE scale       Expected Outcomes  Short Term: Able to use RPE daily in rehab to express subjective intensity level;Long Term:  Able to use RPE to guide intensity level when exercising independently       Knowledge and understanding of Target Heart Rate Range (THRR)  Yes       Intervention  Provide education and explanation of THRR including how the numbers were predicted and where they are located for  reference       Expected Outcomes  Short Term: Able to state/look up THRR;Short Term: Able to use daily as guideline for intensity in rehab;Long Term: Able to use THRR to govern intensity when exercising independently       Able to check pulse independently  Yes       Intervention  Provide education and demonstration on how to check pulse in carotid and radial arteries.;Review the importance of being able to check your own pulse for safety during independent exercise       Expected Outcomes  Short Term: Able to explain why pulse checking is important during independent exercise;Long Term: Able to check pulse independently and accurately       Understanding of Exercise Prescription  Yes       Intervention  Provide education, explanation, and written materials on patient's individual exercise prescription       Expected Outcomes  Short Term: Able to explain program exercise prescription;Long Term: Able to explain home exercise prescription to exercise independently          Exercise Goals Re-Evaluation : Exercise Goals Re-Evaluation    Row Name 04/30/17 0741 05/07/17 1545 05/09/17 0818 05/20/17 1443 06/04/17 1445     Exercise Goal Re-Evaluation   Exercise Goals Review  Able to understand and use rate of perceived exertion (RPE) scale;Knowledge and understanding of Target Heart Rate Range (THRR);Understanding of Exercise Prescription  -  Increase Physical Activity;Able to understand and use Dyspnea scale;Understanding of Exercise Prescription;Increase Strength and Stamina;Knowledge and understanding of Target Heart Rate Range (THRR);Able to understand and use rate of perceived exertion (RPE) scale;Able to check pulse independently  Increase Physical Activity;Increase Strength and Stamina;Understanding of Exercise Prescription  Increase Physical Activity;Increase Strength and Stamina;Understanding of Exercise Prescription   Comments  Reviewed RPE scale, THR and program prescription with pt today.  Pt  voiced understanding and was given a copy of goals to take home.   Jacqulyn is off to a good start in rehab.  She has completed two full days of exercise.  We will continue to monitor her progression.   Reviewed home exercise with pt today.  Pt plans to walk and go to gym in her apartment complex for exercise.  Reviewed THR, pulse,  RPE, sign and symptoms, NTG use, and when to call 911 or MD.  Also discussed weather considerations and indoor options.  Pt voiced understanding.  Marguerette has been doing well in rehab.  Se is now up to level 4 on the XR and 3.0 mph on the treadmill.  We will continue to monitor her progression.   Liv has been doing well in rehab.  She is currently out of town, but promised to walk everyday that she was gone. She also said that she would be active chasing her granddaughter around.  She is now using 4 lb weights!  We will continue to monitor her progress and increase workloads when she returns.    Expected Outcomes  Short: Use RPE daily to regulate intensity.  Long: Follow program prescription in THR.  Short: Attend rehab regularly.  Long: Make exercise part of routine.   Short: Add in two extra days a week.  Long: Make exercise part of daily routine.   Short: Continue to try to increase workloads.  Long: Exercise more at home.   Short: Return to rehab regularly.  Long: Increase more workloads.    [place] Name 06/17/17 1402 07/02/17 1615           Exercise Goal Re-Evaluation   Exercise Goals Review  Increase Physical Activity;Increase Strength and Stamina;Understanding of Exercise Prescription  Increase Physical Activity;Increase Strength and Stamina;Understanding of Exercise Prescription      Comments  Threasa continues to do well in rehab.  She exercised daily while she was out of town.  She did notice that it was not the same as what she had been doing while she was here.  However, she was able to return to her workloads without a problem.  We will continue to monitor her progression.   Shanequia  has been doing well in rehab. 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  She is now up to level 6 on the XR at 6 METs.  We will continue to monitor her progression.       Expected Outcomes  Short: Continue to work on increasing workloads.  Long: Continue to exercise daily.   Short: Talk about adding in intervals. Long: Continue to make exercise part of routine.          Discharge Exercise Prescription (Final Exercise Prescription Changes): Exercise Prescription Changes - 07/02/17 1600      Response to Exercise   Blood Pressure (Admit)  124/60    Blood Pressure (Exercise)  142/88    Blood Pressure (Exit)  110/60    Heart Rate (Admit)  49 bpm    Heart Rate (Exercise)  97 bpm    Heart Rate (Exit)  55 bpm    Rating of Perceived Exertion (Exercise)  11    Symptoms  none    Duration  Continue with 45 min of aerobic exercise without signs/symptoms of physical distress.    Intensity  THRR unchanged      Progression   Progression  Continue to progress workloads to maintain intensity without signs/symptoms of physical distress.    Average METs  4.67      Resistance Training   Training Prescription  Yes    Weight  4 lbs    Reps  10-15      Interval Training   Interval Training  No      Treadmill   MPH  3    Grade  2    Minutes  15    METs  4.12      NuStep   Level  4    Minutes  15    METs  3.5      REL-XR   Level  6    Minutes  15    METs  6.4      Home Exercise Plan   Plans to continue exercise at  Home (comment) walking and gym in apartment complex    Frequency  Add 2 additional days to program exercise sessions.    Initial Home Exercises Provided  05/09/17       Nutrition:  Target Goals: Understanding of nutrition guidelines, daily intake of sodium <1574m, cholesterol <2050m calories 30% from fat and 7% or less from saturated fats, daily to have 5 or more servings of  fruits and vegetables.  Biometrics: Pre Biometrics - 04/28/17 1254      Pre Biometrics   Height  5' 1.1" (1.552 m)    Weight  181 lb 1 oz (82.1 kg)    Waist Circumference  34.5 inches    Hip Circumference  44.5 inches    Waist to Hip Ratio  0.78 %    BMI (Calculated)  34.1    Single Leg Stand  7.08 seconds        Nutrition Therapy Plan and Nutrition Goals: Nutrition Therapy & Goals - 05/15/17 1715      Nutrition Therapy   Diet  TLC, low fiber due to pancreatic insufficiency and IBS    Protein (specify units)  6-7oz    Fruits and Vegetables  5 servings/day    Sodium  1500 grams      Personal Nutrition Goals   Nutrition Goal  Avoid hard, crunchy foods and large portions of high fiber veggies and fruits.     Personal Goal #2  Keep sodium intake to 150028maily, an average of 500m23mth each meal.     Comments  Ms. StolKluesner had significant relief of diarrhea since beginning treatment with Creon (digestive enzymes). She is working to control sodium intake. Gave FODmap diet so she can eliminate potential IBS triggers if she does have additional bouts of IBS.        Nutrition Discharge: Rate Your Plate Scores: Nutrition Assessments - 04/28/17 1204      MEDFICTS Scores   Pre Score  3       Nutrition Goals Re-Evaluation: Nutrition Goals Re-Evaluation    Row Name 06/04/17 1442 06/18/17 0740           Goals   Current Weight  -  174 lb (78.9 kg)      Nutrition Goal  Avoid hard, crunchy foods and large portions of high fiber veggies and fruits. Watch sodium intake  Avoid hard, crunchy foods and large portions of high fiber veggies and fruits. Watch sodium intake      Comment  LoriTraviaout of town but planned to watch her diet while she is away.    [place] a cookie while she was out of town and her granddaughter told on her!!  She enjoyed it!  She is eating more fruits and veggies and drinking more water.  She is closely monitoring her sodium intake.        Expected Outcome   Short: Enjoy her vacation.  Long: Continue to follow heart healthy diet.   Short: Continue to watch diet.  Long: Maintain  heart healthy diet.          Nutrition Goals Discharge (Final Nutrition Goals Re-Evaluation): Nutrition Goals Re-Evaluation - 06/18/17 0740      Goals   Current Weight  174 lb (78.9 kg)    Nutrition Goal  Avoid hard, crunchy foods and large portions of high fiber veggies and fruits. Watch sodium intake    Comment  Keajah had a cookie while she was out of town and her granddaughter told on her!!  She enjoyed it!  She is eating more fruits and veggies and drinking more water.  She is closely monitoring her sodium intake.      Expected Outcome  Short: Continue to watch diet.  Long: Maintain heart healthy diet.        Psychosocial: Target Goals: Acknowledge presence or absence of significant depression and/or stress, maximize coping skills, provide positive support system. Participant is able to verbalize types and ability to use techniques and skills needed for reducing stress and depression.   Initial Review & Psychosocial Screening: Initial Psych Review & Screening - 04/28/17 1206      Initial Review   Current issues with  Current Sleep Concerns Only sleeping 2-3 hours at a time. Has referral to MD for sleep study      Family Dynamics   Good Support System?  Yes Husband, son that lives at home, daughter and a sister lives in [place]      Barriers   Psychosocial barriers to participate in program  There are no identifiable barriers or psychosocial needs.;The patient should benefit from training in stress management and relaxation.      Screening Interventions   Interventions  Encouraged to exercise;Provide feedback about the scores to participant;To provide support and resources with identified psychosocial needs       Quality of Life Scores:  Quality of Life - 04/28/17 1211      Quality of Life Scores   Health/Function Pre  28.62 %    Socioeconomic Pre  30 %     Psych/Spiritual Pre  30 %    Family Pre  37.6 %    GLOBAL Pre  29.06 %       PHQ-9: Recent Review Flowsheet Data    Depression screen [facility] 2/9 04/28/2017   Decreased Interest 0   Down, Depressed, Hopeless 0   PHQ - 2 Score 0   Altered sleeping 0   Tired, decreased energy 0   Change in appetite 0   Feeling bad or failure about yourself  0   Trouble concentrating 0   Moving slowly or fidgety/restless 0   Suicidal thoughts 0   PHQ-9 Score 0   Difficult doing work/chores Not difficult at all     Interpretation of Total Score  Total Score Depression Severity:  1-4 = Minimal depression, 5-9 = Mild depression, 10-14 = Moderate depression, 15-19 = Moderately severe depression, 20-27 = Severe depression   Psychosocial Evaluation and Intervention: Psychosocial Evaluation - 05/07/17 0935      Psychosocial Evaluation & Interventions   Interventions  Encouraged to exercise with the program and follow exercise prescription;Stress management education    Comments  Counselor met with Ms. Wille Glaser Cecille Rubin) today for initial psychosocial evaluation.  She is a 57 year old who went into cardiac arrest on 8/21.  She has a strong support system  with a spouse of 36 years and an adult son who lives in the home as well.  Concettina states she sleeps "horribly" and had a  sleep study last night.  She reports having used essential oils in the past week which was helpful and she got almost 6 hours sleep those nights.  She will be researching this more.  Jewelia has a great appetite and denies a history of depression or anxiety.  She states her mood is typically very positive and she has minimal stress in her life other than her health and not being able to drive due to her condition until March, so her son is driving her back and forth to class and once she returns to work - will do the same.  Shunte has goals to increase her stamina and strength and to begin a habit of exercising.  She also reports seeing an ENT on 11/7  due to her voice which she states her vocal chords may have damaged during her emergency incident with her heart on 8/21.      Expected Outcomes  Kristyn will benefit from consistent exercise to achieve her stated goals.  The educational and psychoeducational components of this program will be helpful for her to learn more about her condition and positive ways to manage this in her life.  She will see the ENT soon for her voice concerns and will update staff on the outcome of her sleep study.      Continue Psychosocial Services   Follow up required by staff       Psychosocial Re-Evaluation: Psychosocial Re-Evaluation    [place] Name 05/14/17 1012 06/04/17 1441 06/18/17 0744         Psychosocial Re-Evaluation   Current issues with  Current Sleep Concerns  Current Stress Concerns  Current Stress Concerns     Comments  Counselor follow up with Cecille Rubin today reporting the outcome of her sleep study was that she has "insomnia" but she states the use of a diffuser with essential oils has improved her sleep to 5-6 hours/night vs. 2-3 hours - so she is pleased with that.  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  She continues to be in a positive mood and has been able to return to work part-time.  Counselor will continue to follow with her.    Shelisa will be out of town for the next two weeks.  She is visiting family in [place] and seeing her granddaughter for the first time.  She was very excited to be able to go and to have the energy to be able to go!!  Leler is back to work!  She covered during the snow.  She is still frustrated not being able to drive, but plans to talk to the doctor about it this week.  She enjoyed her vacation.   She is sleeping better now then when she started the progream.       Expected Outcomes  Cicely will continue to exercise consistently and continue with her sleep regimen that is working.    Short: enjoy her vacation  Long:  Return to rehab and attend regularly  Short: Continue to attend classes to gain strength and talk to doctor about driving.  Long: Continue to maintain positive attitude.      Interventions  -  Encouraged to attend Cardiac Rehabilitation for the exercise;Stress management education  -     Continue Psychosocial Services   -  Follow up required by staff  -        Psychosocial Discharge (Final Psychosocial Re-Evaluation): Psychosocial Re-Evaluation -  06/18/17 0744      Psychosocial Re-Evaluation   Current issues with  Current Stress Concerns    Comments  Otis is back to work!  She covered during the snow.  She is still frustrated not being able to drive, but plans to talk to the doctor about it this week.  She enjoyed her vacation.   She is sleeping better now then when she started the progream.      Expected Outcomes  Short: Continue to attend classes to gain strength and talk to doctor about driving.  Long: Continue to maintain positive attitude.        Vocational Rehabilitation: Provide vocational rehab assistance to qualifying candidates.   Vocational Rehab Evaluation & Intervention: Vocational Rehab - 04/28/17 1213      Initial Vocational Rehab Evaluation & Intervention   Assessment shows need for Vocational Rehabilitation  No       Education: Education Goals: Education classes will be provided on a variety of topics geared toward better understanding of heart health and risk factor modification. Participant will state understanding/return demonstration of topics presented as noted by education test scores.  Learning Barriers/Preferences: Learning Barriers/Preferences - 04/28/17 1212      Learning Barriers/Preferences   Learning Barriers  None    Learning Preferences  Verbal Instruction       Education Topics: General Nutrition Guidelines/Fats and Fiber: -Group instruction provided by verbal, written material, models and posters to present the general guidelines for heart  healthy nutrition. Gives an explanation and review of dietary fats and fiber.   Controlling Sodium/Reading Food Labels: -Group verbal and written material supporting the discussion of sodium use in heart healthy nutrition. Review and explanation with models, verbal and written materials for utilization of the food label.   Exercise Physiology & Risk Factors: - Group verbal and written instruction with models to review the exercise physiology of the cardiovascular system and associated critical values. Details cardiovascular disease risk factors and the goals associated with each risk factor.   Cardiac Rehab from 07/02/2017 in [facility] Cardiac and Pulmonary Rehab  Date  07/02/17  Educator  [facility]  Instruction Review Code  1- Verbalizes Understanding      Aerobic Exercise & Resistance Training: - Gives group verbal and written discussion on the health impact of inactivity. On the components of aerobic and resistive training programs and the benefits of this training and how to safely progress through these programs.   Cardiac Rehab from 07/02/2017 in [facility] Cardiac and Pulmonary Rehab  Date  05/07/17  Educator  [facility]  Instruction Review Code  1- Verbalizes Understanding      Flexibility, Balance, General Exercise Guidelines: - Provides group verbal and written instruction on the benefits of flexibility and balance training programs. Provides general exercise guidelines with specific guidelines to those with heart or lung disease. Demonstration and skill practice provided.   Stress Management: - Provides group verbal and written instruction about the health risks of elevated stress, cause of high stress, and healthy ways to reduce stress.   Cardiac Rehab from 07/02/2017 in [facility] Cardiac and Pulmonary Rehab  Date  05/21/17  Educator  [facility]  Instruction Review Code  1- Verbalizes Understanding      Depression: - Provides group verbal and written instruction on the correlation between heart/lung  disease and depressed mood, treatment options, and the stigmas associated with seeking treatment.   Cardiac Rehab from 07/02/2017 in [facility] Cardiac and Pulmonary Rehab  Date  06/18/17  Educator  Lupita Leash  Instruction Review Code  1- Verbalizes Understanding      Anatomy & Physiology of the Heart: - Group verbal and written instruction and models provide basic cardiac anatomy and physiology, with the coronary electrical and arterial systems. Review of: AMI, Angina, Valve disease, Heart Failure, Cardiac Arrhythmia, Pacemakers, and the ICD.   Cardiac Procedures: - Group verbal and written instruction to review commonly prescribed medications for heart disease. Reviews the medication, class of the drug, and side effects. Includes the steps to properly store meds and maintain the prescription regimen. (beta blockers and nitrates)   Cardiac Medications I: - Group verbal and written instruction to review commonly prescribed medications for heart disease. Reviews the medication, class of the drug, and side effects. Includes the steps to properly store meds and maintain the prescription regimen.   Cardiac Medications II: -Group verbal and written instruction to review commonly prescribed medications for heart disease. Reviews the medication, class of the drug, and side effects. (all other drug classes)    Go Sex-Intimacy & Heart Disease, Get SMART - Goal Setting: - Group verbal and written instruction through game format to discuss heart disease and the return to sexual intimacy. Provides group verbal and written material to discuss and apply goal setting through the application of the S.M.A.R.T. Method.   Other Matters of the Heart: - Provides group verbal, written materials and models to describe Heart Failure, Angina, Valve Disease, Peripheral Artery Disease, and Diabetes in the realm of heart disease. Includes description of the disease process and treatment options available to the cardiac  patient.   Exercise & Equipment Safety: - Individual verbal instruction and demonstration of equipment use and safety with use of the equipment.   Cardiac Rehab from 07/02/2017 in [facility] Cardiac and Pulmonary Rehab  Date  04/28/17  Educator  SB  Instruction Review Code  1- Verbalizes Understanding      Infection Prevention: - Provides verbal and written material to individual with discussion of infection control including proper hand washing and proper equipment cleaning during exercise session.   Cardiac Rehab from 07/02/2017 in [facility] Cardiac and Pulmonary Rehab  Date  04/28/17  Educator  SB  Instruction Review Code  1- Verbalizes Understanding      Falls Prevention: - Provides verbal and written material to individual with discussion of falls prevention and safety.   Cardiac Rehab from 07/02/2017 in [facility] Cardiac and Pulmonary Rehab  Date  04/28/17  Educator  SB  Instruction Review Code  1- Verbalizes Understanding      Diabetes: - Individual verbal and written instruction to review signs/symptoms of diabetes, desired ranges of glucose level fasting, after meals and with exercise. Acknowledge that pre and post exercise glucose checks will be done for 3 sessions at entry of program.   Other: -Provides group and verbal instruction on various topics (see comments)    Knowledge Questionnaire Score: Knowledge Questionnaire Score - 04/28/17 1212      Knowledge Questionnaire Score   Pre Score  25/28 Reviewed corrcet responses with Cecille Rubin. She verbalized understanding of the response and had no further questions today.       Core Components/Risk Factors/Patient Goals at Admission: Personal Goals and Risk Factors at Admission - 04/28/17 1205      Core Components/Risk Factors/Patient Goals on Admission    Weight Management  Obesity;Weight Loss;Yes    Intervention  Weight Management: Develop a combined nutrition and exercise program designed to reach desired caloric intake, while  maintaining appropriate intake of nutrient and fiber,  sodium and fats, and appropriate energy expenditure required for the weight goal.;Weight Management/Obesity: Establish reasonable short term and long term weight goals.    Admit Weight  181 lb (82.1 kg) lost 30 pounds while in the hospital    Goal Weight: Short Term  178 lb (80.7 kg)    Goal Weight: Long Term  151 lb (68.5 kg)    Expected Outcomes  Long Term: Adherence to nutrition and physical activity/exercise program aimed toward attainment of established weight goal;Weight Loss: Understanding of general recommendations for a balanced deficit meal plan, which promotes 1-2 lb weight loss per week and includes a negative energy balance of [phone] kcal/d;Understanding of distribution of calorie intake throughout the day with the consumption of 4-5 meals/snacks;Understanding recommendations for meals to include 15-35% energy as protein, 25-35% energy from fat, 35-60% energy from carbohydrates, less than 283m of dietary cholesterol, 20-35 gm of total fiber daily    Hypertension  Yes    Intervention  Provide education on lifestyle modifcations including regular physical activity/exercise, weight management, moderate sodium restriction and increased consumption of fresh fruit, vegetables, and low fat dairy, alcohol moderation, and smoking cessation.;Monitor prescription use compliance.    Expected Outcomes  Short Term: Continued assessment and intervention until BP is < 140/919mHG in hypertensive participants. < 130/8080mG in hypertensive participants with diabetes, heart failure or chronic kidney disease.;Long Term: Maintenance of blood pressure at goal levels.       Core Components/Risk Factors/Patient Goals Review:  Goals and Risk Factor Review    Row Name 06/04/17 1443 06/18/17 0739           Core Components/Risk Factors/Patient Goals Review   Personal Goals Review  Weight Management/Obesity;Hypertension  Weight  Management/Obesity;Hypertension      Review  LorMeaghans been doing well in rehab.  She is currently out of town visiting family. She has reached her short term weight loss goal and hopes to maintain that even while away.  Her blood pressures have been good in rehab and she does check them some at home.   LorEmorie doing well in rehab. Her weight is coming down; she is down to 174 lbs today.  Her blood pressures continue to do well.        Expected Outcomes  Short: Continue to work on weight loss.  Long: Continue to monitor her risk factors.   Short: Continue to work on weight loss.  Long: Continue to monitor her risk factors.          Core Components/Risk Factors/Patient Goals at Discharge (Final Review):  Goals and Risk Factor Review - 06/18/17 0739      Core Components/Risk Factors/Patient Goals Review   Personal Goals Review  Weight Management/Obesity;Hypertension    Review  LorCarlissa doing well in rehab. Her weight is coming down; she is down to 174 lbs today.  Her blood pressures continue to do well.      Expected Outcomes  Short: Continue to work on weight loss.  Long: Continue to monitor her risk factors.        ITP Comments: ITP Comments    Row Name 04/28/17 1202 05/14/17 0631 06/04/17 1440 06/11/17 0558 07/09/17 0640   ITP Comments   Medical review completed today.  Initial ITP created for review by Medical Director and changes as necessary.   [facility]  30 day review. Continue with ITP unless directed changes per Medical Director review.  New to program  Ennifer will be out of town for the next two weeks.  She is visiting family in [place] and seeing her granddaughter for the first time.  She was very excited to be able to go and to have the energy to be able to go!!  30 day review. Continue with ITP unless directed changes per Medical Director review.   30 day review. Continue with ITP unless directed changes per Medical Director review.        Comments:

## 2017-07-11 ENCOUNTER — Other Ambulatory Visit: Payer: Self-pay

## 2017-07-11 ENCOUNTER — Telehealth: Payer: Self-pay | Admitting: Gastroenterology

## 2017-07-11 DIAGNOSIS — I110 Hypertensive heart disease with heart failure: Secondary | ICD-10-CM | POA: Diagnosis not present

## 2017-07-11 DIAGNOSIS — R197 Diarrhea, unspecified: Secondary | ICD-10-CM

## 2017-07-11 DIAGNOSIS — Z79899 Other long term (current) drug therapy: Secondary | ICD-10-CM | POA: Diagnosis not present

## 2017-07-11 DIAGNOSIS — I469 Cardiac arrest, cause unspecified: Secondary | ICD-10-CM | POA: Diagnosis not present

## 2017-07-11 DIAGNOSIS — Z7982 Long term (current) use of aspirin: Secondary | ICD-10-CM | POA: Diagnosis not present

## 2017-07-11 DIAGNOSIS — I5022 Chronic systolic (congestive) heart failure: Secondary | ICD-10-CM | POA: Diagnosis not present

## 2017-07-11 DIAGNOSIS — I2510 Atherosclerotic heart disease of native coronary artery without angina pectoris: Secondary | ICD-10-CM | POA: Diagnosis not present

## 2017-07-11 DIAGNOSIS — I4901 Ventricular fibrillation: Secondary | ICD-10-CM | POA: Diagnosis not present

## 2017-07-11 MED ORDER — PANCRELIPASE (LIP-PROT-AMYL) 36000-114000 UNITS PO CPEP: 72000.0000 [IU] | ORAL_CAPSULE | Freq: Three times a day (TID) | ORAL | 3 refills | Status: DC

## 2017-07-11 NOTE — Progress Notes (Signed)
Daily Session Note  Patient Details  Name: Miranda [name] MRN: [id number] Date of Birth: 11/17/1960 Referring Provider:     Cardiac Rehab from 04/28/2017 in [facility] Cardiac and Pulmonary Rehab  Referring Provider  Gollan, Timothy MD      Encounter Date: 07/11/2017  Check In: Session Check In - 07/11/17 0933      Check-In   Location  [facility]-Cardiac & Pulmonary Rehab    Staff Present  Meredith Craven, RN BSN;Jessica Hawkins, MA, ACSM RCEP, Exercise Physiologist; , BA, ACSM CEP, Exercise Physiologist    Supervising physician immediately available to respond to emergencies  See telemetry face sheet for immediately available ER MD    Medication changes reported      No    Fall or balance concerns reported     No    Warm-up and Cool-down  Performed on first and last piece of equipment    Resistance Training Performed  Yes    VAD Patient?  No      Pain Assessment   Currently in Pain?  No/denies    Multiple Pain Sites  No          Social History   Tobacco Use  Smoking Status Never Smoker  Smokeless Tobacco Never Used    Goals Met:  Independence with exercise equipment Exercise tolerated well No report of cardiac concerns or symptoms Strength training completed today  Goals Unmet:  Not Applicable  Comments: Pt able to follow exercise prescription today without complaint.  Will continue to monitor for progression.    Dr. Mark Miller is Medical Director for [facility] and [facility]. 

## 2017-07-11 NOTE — Telephone Encounter (Signed)
Patient left a voice message that she needs more Creon. Please call

## 2017-07-16 DIAGNOSIS — I469 Cardiac arrest, cause unspecified: Secondary | ICD-10-CM

## 2017-07-16 NOTE — Progress Notes (Signed)
Daily Session Note  Patient Details  Name: Miranda [name] MRN: [id number] Date of Birth: 1961-06-22 Referring Provider:     Cardiac Rehab from 04/28/2017 in [name] Cardiac and Pulmonary Rehab  Referring Provider  Ida Rogue MD      Encounter Date: 07/16/2017  Check In: Session Check In - 07/16/17 1743      Check-In   Location  [facility]-Cardiac & Pulmonary Rehab    Staff Present  Renita Papa, RN Vickki Hearing, BA, ACSM CEP, Exercise Physiologist;Carroll Enterkin, RN, BSN    Supervising physician immediately available to respond to emergencies  See telemetry face sheet for immediately available ER MD    Medication changes reported      No    Fall or balance concerns reported     No    Warm-up and Cool-down  Performed on first and last piece of equipment    Resistance Training Performed  Yes    VAD Patient?  No      Pain Assessment   Currently in Pain?  No/denies    Multiple Pain Sites  No          Social History   Tobacco Use  Smoking Status Never Smoker  Smokeless Tobacco Never Used    Goals Met:  Independence with exercise equipment Exercise tolerated well No report of cardiac concerns or symptoms Strength training completed today  Goals Unmet:  Not Applicable  Comments: Pt able to follow exercise prescription today without complaint.  Will continue to monitor for progression.    Dr. Emily Filbert is Medical Director for [facility] and [facility].

## 2017-07-18 DIAGNOSIS — I469 Cardiac arrest, cause unspecified: Secondary | ICD-10-CM

## 2017-07-18 NOTE — Progress Notes (Signed)
Daily Session Note  Patient Details  Name: Miranda [name] MRN: [id number] Date of Birth: Jun 24, 1961 Referring Provider:     Cardiac Rehab from 04/28/2017 in [facility] Cardiac and Pulmonary Rehab  Referring Provider  Ida Rogue MD      Encounter Date: 07/18/2017  Check In: Session Check In - 07/18/17 0842      Check-In   Location  [facility]-Cardiac & Pulmonary Rehab    Staff Present  Renita Papa, RN Vickki Hearing, BA, ACSM CEP, Exercise Physiologist;Darlene Brozowski Luan Pulling, MA, ACSM RCEP, Exercise Physiologist    Supervising physician immediately available to respond to emergencies  See telemetry face sheet for immediately available ER MD    Medication changes reported      No    Fall or balance concerns reported     No    Warm-up and Cool-down  Performed on first and last piece of equipment    Resistance Training Performed  Yes    VAD Patient?  No      Pain Assessment   Currently in Pain?  No/denies          Social History   Tobacco Use  Smoking Status Never Smoker  Smokeless Tobacco Never Used    Goals Met:  Independence with exercise equipment Exercise tolerated well Personal goals reviewed No report of cardiac concerns or symptoms Strength training completed today  Goals Unmet:  Not Applicable  Comments: Pt able to follow exercise prescription today without complaint.  Will continue to monitor for progression. See ITP for goal review.    Dr. Emily Filbert is Medical Director for [place] and [facility].

## 2017-07-21 DIAGNOSIS — I469 Cardiac arrest, cause unspecified: Secondary | ICD-10-CM | POA: Diagnosis not present

## 2017-07-21 NOTE — Progress Notes (Signed)
Daily Session Note  Patient Details  Name: Miranda [name] MRN: [id number] Date of Birth: [birth year] Referring Provider:     Cardiac Rehab from 04/28/2017 in [facility] Cardiac and Pulmonary Rehab  Referring Provider  Ida Rogue MD      Encounter Date: 07/21/2017  Check In: Session Check In - 07/21/17 1728      Check-In   Location  [facility]-Cardiac & Pulmonary Rehab    Staff Present  Earlean Shawl, BS, ACSM CEP, Exercise Physiologist;Amanda Oletta Darter, BA, ACSM CEP, Exercise Physiologist;Carroll Enterkin, RN, BSN    Supervising physician immediately available to respond to emergencies  See telemetry face sheet for immediately available ER MD    Medication changes reported      No    Fall or balance concerns reported     No    Warm-up and Cool-down  Performed on first and last piece of equipment    Resistance Training Performed  Yes    VAD Patient?  No      Pain Assessment   Currently in Pain?  No/denies    Multiple Pain Sites  No          Social History   Tobacco Use  Smoking Status Never Smoker  Smokeless Tobacco Never Used    Goals Met:  Independence with exercise equipment Exercise tolerated well No report of cardiac concerns or symptoms Strength training completed today  Goals Unmet:  Not Applicable  Comments: Pt able to follow exercise prescription today without complaint.  Will continue to monitor for progression.    Dr. Emily Filbert is Medical Director for [place] and [facility].

## 2017-07-23 DIAGNOSIS — I469 Cardiac arrest, cause unspecified: Secondary | ICD-10-CM

## 2017-07-23 NOTE — Progress Notes (Signed)
Daily Session Note  Patient Details  Name: Charmayne Odell MRN: [id number] Date of Birth: 1960/10/13 Referring Provider:     Cardiac Rehab from 04/28/2017 in [facility] Cardiac and Pulmonary Rehab  Referring Provider  Ida Rogue MD      Encounter Date: 07/23/2017  Check In: Session Check In - 07/23/17 0726      Check-In   Location  [facility]-Cardiac & Pulmonary Rehab    Staff Present  Justin Mend RCP,RRT,BSRT;Krista Frederico Hamman, RN BSN;Jessica Luan Pulling, MA, [place], CCRP, Exercise Physiologist    Supervising physician immediately available to respond to emergencies  See telemetry face sheet for immediately available ER MD    Medication changes reported      No    Fall or balance concerns reported     No    Warm-up and Cool-down  Performed on first and last piece of equipment    Resistance Training Performed  Yes    VAD Patient?  No      Pain Assessment   Currently in Pain?  No/denies          Social History   Tobacco Use  Smoking Status Never Smoker  Smokeless Tobacco Never Used    Goals Met:  Independence with exercise equipment Exercise tolerated well No report of cardiac concerns or symptoms Strength training completed today  Goals Unmet:  Not Applicable  Comments: Pt able to follow exercise prescription today without complaint.  Will continue to monitor for progression.   Dr. Emily Filbert is Medical Director for [place] and [facility].

## 2017-07-25 VITALS — Ht 61.1 in | Wt 177.0 lb

## 2017-07-25 DIAGNOSIS — I469 Cardiac arrest, cause unspecified: Secondary | ICD-10-CM | POA: Diagnosis not present

## 2017-07-25 NOTE — Progress Notes (Signed)
Daily Session Note  Patient Details  Name: Miranda [name] Date MRN: [id number] Date of Birth: 01/13/1961 Referring Provider:     Cardiac Rehab from 04/28/2017 in [facility] Cardiac and Pulmonary Rehab  Referring Provider  Gollan, Timothy MD      Encounter Date: 07/25/2017  Check In: Session Check In - 07/25/17 0841      Check-In   Location  [facility]-Cardiac & Pulmonary Rehab    Staff Present  Jessica Hawkins, MA, RCEP, CCRP, Exercise Physiologist;Meredith Craven, RN BSN; , BA, ACSM CEP, Exercise Physiologist    Supervising physician immediately available to respond to emergencies  See telemetry face sheet for immediately available ER MD    Medication changes reported      No    Fall or balance concerns reported     No    Warm-up and Cool-down  Performed on first and last piece of equipment    Resistance Training Performed  Yes    VAD Patient?  No      Pain Assessment   Currently in Pain?  No/denies    Multiple Pain Sites  No          Social History   Tobacco Use  Smoking Status Never Smoker  Smokeless Tobacco Never Used    Goals Met:  Independence with exercise equipment Exercise tolerated well No report of cardiac concerns or symptoms Strength training completed today  Goals Unmet:  Not Applicable  Comments:  6 Minute Walk    Row Name 04/28/17 1242 07/25/17 0843       6 Minute Walk   Phase  -  Discharge    Distance  1525 feet  1550 feet    Distance % Change  -  1.6 %    Distance Feet Change  -  25 ft    Walk Time  6 minutes  6 minutes    # of Rest Breaks  0  0    MPH  2.8  2.94    METS  3.5  3.74    RPE  12  12    Perceived Dyspnea   0  -    VO2 Peak  12.59  13.08    Symptoms  No  No    Resting HR  64 bpm  49 bpm    Resting BP  116/70  130/76    Resting Oxygen Saturation   100 %  95 %    Exercise Oxygen Saturation  during 6 min walk  97 %  100 %    Max Ex. HR  98 bpm  105 bpm    Max Ex. BP  146/84  144/74    2 Minute Post BP  134/72  -          Dr. Mark Miller is Medical Director for [facility] and [facility]. 

## 2017-07-26 NOTE — Progress Notes (Signed)
Cardiology Office Note  Date:  07/28/2017   ID:  Miranda [name], DOB 1960-07-09, MRN [id number]  PCP:  Judeen Hammans, MD   Chief Complaint  Patient presents with  . other    3 month f/u no complaints.  Meds reviewed verbally with pt.    HPI:  Miranda [name] Is a very pleasant 57 year old woman with history of  hypertension,   leg swelling. 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 TTE 03/03/2017: EF 55-60%, nl WM, mild AI, PASP 46 Medtronic ICD was implanted 9/18 MRI showing mild epicardial gadolinium uptake in the distal anterior wall and inferior base  No significant CAD by cath 02/2017 she had interval normalization of LV function and QT intervals  works in Astronomer at [employer] Who presents for follow up of her NICM, sustained VT, HTN  In follow-up she reports that she is doing well Completing cardiac rehabilitation 3 more sessions to go Reports having significant bradycardia when measured by cardiac rehabilitation, heart rates in the 30s With exertion heart rate up to 60 Asymptomatic Next ICD check March 2019 Last checked by Dr. Graciela Husbands December 2018  Blood pressure stable on current regimen Only taking low-dose Coreg with potassium Concerned Coreg could be causing her chronic cough Wonders if carvedilol could be causing hair loss   allergy to ACE inhibitor with cough and hives No significant allergy or intolerance to ARB's, reports they did not work as well  Recently has not had any problems with leg swelling,  wears compression hose daily  She denies any significant chest pressure, shortness of breath on exertion She has never smoked, no diabetes  EKG on today's visit shows sinus bradycardia rate 48 bpm, no significant ST or T-wave changes  PMH:   has a past medical history of Coronary artery disease, non-occlusive, Dependent edema, Hot flashes, HTN  (hypertension), Hypokalemia, IBS (irritable bowel syndrome), Idiopathic Ventricular fibrillation (HCC), Migraines, Nausea, and NICM w/ recovery of LV dysfxn.  PSH:    Past Surgical History:  Procedure Laterality Date  . APPENDECTOMY  1974  . CHOLECYSTECTOMY  1984  . COLONOSCOPY WITH PROPOFOL N/A 05/22/2017   Procedure: COLONOSCOPY WITH PROPOFOL;  Surgeon: Wyline Mood, MD;  Location: [facility] ENDOSCOPY;  Service: Gastroenterology;  Laterality: N/A;  . ICD IMPLANT N/A 03/17/2017   Procedure: ICD Implant;  Surgeon: Duke Salvia, MD;  Location: [facility] INVASIVE CV LAB;  Service: Cardiovascular;  Laterality: N/A;  . LEFT HEART CATH AND CORONARY ANGIOGRAPHY N/A 02/26/2017   Procedure: LEFT HEART CATH AND CORONARY ANGIOGRAPHY;  Surgeon: Yvonne Kendall, MD;  Location: [facility] INVASIVE CV LAB;  Service: Cardiovascular;  Laterality: N/A;  . TONSILLECTOMY  1964    Current Outpatient Medications  Medication Sig Dispense Refill  . aspirin EC 81 MG EC tablet Take 1 tablet (81 mg total) by mouth daily.    . carvedilol (COREG) 3.125 MG tablet Take 1 tablet (3.125 mg total) by mouth 2 (two) times daily with a meal. 180 tablet 3  . lipase/protease/amylase (CREON) 36000 UNITS CPEP capsule Take 2 capsules (72,000 Units total) by mouth 3 (three) times daily before meals. 90 capsule 3  . potassium chloride SA (K-DUR,KLOR-CON) 20 MEQ tablet Take 1 tablet (20 mEq total) by mouth daily. 90 tablet 3   No current facility-administered medications for this visit.      Allergies:   Ace inhibitors; Compazine [prochlorperazine edisylate]; Lisinopril; and Penicillins  Social History:  The patient  reports that  has never smoked. she has never used smokeless tobacco. She reports that she does not drink alcohol or use drugs.   Family History:   family history includes Heart disease in her father; Hyperlipidemia in her father; Hypertension in her brother, father, and sister; Lymphoma in her mother; Migraines in her sister;  Stroke in her father.    Review of Systems: Review of Systems  Constitutional: Negative.   Respiratory: Negative.   Cardiovascular: Negative.   Gastrointestinal: Negative.   Musculoskeletal: Negative.   Neurological: Negative.   Psychiatric/Behavioral: Negative.   All other systems reviewed and are negative.    PHYSICAL EXAM: VS:  BP 130/82 (BP Location: Left Arm, Patient Position: Sitting, Cuff Size: Large)   Pulse (!) 48   Ht 5' (1.524 m)   Wt 179 lb 8 oz (81.4 kg)   BMI 35.06 kg/m  , BMI Body mass index is 35.06 kg/m. GEN: Well nourished, well developed, in no acute distress, obese  HEENT: normal  Neck: no JVD, carotid bruits, or masses Cardiac: RRR; no murmurs, rubs, or gallops,no edema  Respiratory:  clear to auscultation bilaterally, normal work of breathing GI: soft, nontender, nondistended, + BS MS: no deformity or atrophy  Skin: warm and dry, no rash Neuro:  Strength and sensation are intact Psych: euthymic mood, full affect    Recent Labs: 03/14/2017: Magnesium 2.1 03/15/2017: B Natriuretic Peptide 116.2; TSH 1.637 03/21/2017: ALT 48 03/24/2017: Hemoglobin 11.5; Platelets 178 04/29/2017: BUN 11; Creatinine, Ser 0.67; Potassium 4.4; Sodium 142    Lipid Panel Lab Results  Component Value Date   TRIG 119 03/10/2017      Wt Readings from Last 3 Encounters:  07/28/17 179 lb 8 oz (81.4 kg)  07/25/17 177 lb (80.3 kg)  06/19/17 177 lb 8 oz (80.5 kg)       ASSESSMENT AND PLAN:  Hypertension, unspecified type - Plan: EKG 12-Lead Blood pressure is well controlled on today's visit. No changes made to the medications. We did discuss her bradycardia, asymptomatic Will continue on Coreg for now in light of history of nonsustained VT  Edema, unspecified type - Plan: EKG 12-Lead Edema resolved Recommended compression hose for any leg swelling in the summer  Nonsustained VT ICD placed, on low-dose beta blocker  Cardiomyopathy, dilated, nonischemic Improved  ejection fraction on previous echocardiogram   Total encounter time more than 25 minutes  Greater than 50% was spent in counseling and coordination of care with the patient   Disposition:   F/U 1 year   Orders Placed This Encounter  Procedures  . Basic metabolic panel  . EKG 12-Lead     Signed, Dossie Arbour, M.D., Ph.D. 07/28/2017  [facility] [place], [place] [phone] \

## 2017-07-28 ENCOUNTER — Encounter: Payer: Self-pay | Admitting: Cardiovascular Disease

## 2017-07-28 ENCOUNTER — Ambulatory Visit: Payer: BC Managed Care – PPO | Admitting: Cardiovascular Disease

## 2017-07-28 VITALS — BP 130/82 | HR 48 | Ht 60.0 in | Wt 179.5 lb

## 2017-07-28 DIAGNOSIS — Z6835 Body mass index (BMI) 35.0-35.9, adult: Secondary | ICD-10-CM

## 2017-07-28 DIAGNOSIS — I469 Cardiac arrest, cause unspecified: Secondary | ICD-10-CM

## 2017-07-28 DIAGNOSIS — I10 Essential (primary) hypertension: Secondary | ICD-10-CM | POA: Diagnosis not present

## 2017-07-28 DIAGNOSIS — I4901 Ventricular fibrillation: Secondary | ICD-10-CM

## 2017-07-28 DIAGNOSIS — E6609 Other obesity due to excess calories: Secondary | ICD-10-CM

## 2017-07-28 NOTE — Progress Notes (Signed)
Daily Session Note  Patient Details  Name: Miranda [name] MRN: [id number] Date of Birth: 04-08-1961 Referring Provider:     Cardiac Rehab from 04/28/2017 in [facility]  Referring Provider  Ida Rogue MD      Encounter Date: 07/28/2017  Check In: Session Check In - 07/28/17 1735      Check-In   Location  [facility]-Cardiac & Pulmonary Rehab    Staff Present  [place], BS, PEC;Nada Maclachlan, BA, ACSM CEP, Exercise Physiologist;Carroll Enterkin, RN, BSN    Supervising physician immediately available to respond to emergencies  See telemetry face sheet for immediately available ER MD    Medication changes reported      No    Fall or balance concerns reported     No    Warm-up and Cool-down  Performed on first and last piece of equipment    Resistance Training Performed  Yes    VAD Patient?  No      Pain Assessment   Currently in Pain?  No/denies    Multiple Pain Sites  No        Exercise Prescription Changes - 07/28/17 1600      Response to Exercise   Blood Pressure (Admit)  130/76    Blood Pressure (Exercise)  144/74    Blood Pressure (Exit)  112/64    Heart Rate (Admit)  49 bpm    Heart Rate (Exercise)  100 bpm    Heart Rate (Exit)  60 bpm    Rating of Perceived Exertion (Exercise)  12    Symptoms  none    Duration  Continue with 45 min of aerobic exercise without signs/symptoms of physical distress.    Intensity  THRR unchanged      Progression   Progression  Continue to progress workloads to maintain intensity without signs/symptoms of physical distress.    Average METs  4.54      Resistance Training   Training Prescription  Yes    Weight  4 lbs    Reps  10-15      Interval Training   Interval Training  No      Treadmill   MPH  3    Grade  2    Minutes  15    METs  4.12      NuStep   Level  4    Minutes  15    METs  3.4      REL-XR   Level  6    Minutes  15    METs  6.1      Home Exercise Plan   Plans to continue  exercise at  Home (comment) walking and gym in apartment complex    Frequency  Add 2 additional days to program exercise sessions.    Initial Home Exercises Provided  05/09/17       Social History   Tobacco Use  Smoking Status Never Smoker  Smokeless Tobacco Never Used    Goals Met:  Independence with exercise equipment Exercise tolerated well No report of cardiac concerns or symptoms Strength training completed today  Goals Unmet:  Not Applicable  Comments: Pt able to follow exercise prescription today without complaint.  Will continue to monitor for progression.    Dr. Emily Filbert is Medical Director for [facility] and [facility].

## 2017-07-28 NOTE — Patient Instructions (Addendum)
Medication Instructions:   No medication changes made  Labwork:  We will check BMP today  Testing/Procedures:  No further testing at this time   Follow-Up: It was a pleasure seeing you in the office today. Please call us if you have new issues that need to be addressed before your next appt.  [phone]  Your physician wants you to follow-up in: 12 months.  You will receive a reminder letter in the mail two months in advance. If you don't receive a letter, please call our office to schedule the follow-up appointment.  If you need a refill on your cardiac medications before your next appointment, please call your pharmacy.

## 2017-07-29 LAB — BASIC METABOLIC PANEL
BUN / CREAT RATIO: 26 — AB (ref 9–23)
BUN: 18 mg/dL (ref 6–24)
CHLORIDE: 105 mmol/L (ref 96–106)
CO2: 20 mmol/L (ref 20–29)
CREATININE: 0.7 mg/dL (ref 0.57–1.00)
Calcium: 9.6 mg/dL (ref 8.7–10.2)
GFR calc non Af Amer: 96 mL/min/{1.73_m2} (ref >59)
GFR, EST AFRICAN AMERICAN: 110 mL/min/{1.73_m2} (ref >59)
GLUCOSE: 90 mg/dL (ref 65–99)
Potassium: 5.3 mmol/L — ABNORMAL HIGH (ref 3.5–5.2)
Sodium: 143 mmol/L (ref 134–144)

## 2017-07-31 ENCOUNTER — Encounter: Payer: BC Managed Care – PPO | Admitting: *Deleted

## 2017-07-31 ENCOUNTER — Telehealth: Payer: Self-pay | Admitting: Cardiovascular Disease

## 2017-07-31 DIAGNOSIS — I469 Cardiac arrest, cause unspecified: Secondary | ICD-10-CM

## 2017-07-31 NOTE — Progress Notes (Signed)
Daily Session Note  Patient Details  Name: Miranda [name] MRN: [id number] Date of Birth: 05/04/1961 Referring Provider:     Cardiac Rehab from 04/28/2017 in [facility]  Referring Provider  Gollan, Timothy MD      Encounter Date: 07/31/2017  Check In: Session Check In - 07/31/17 1057      Check-In   Location  [facility]    Staff Present   , MA, RCEP, CCRP, Exercise Physiologist;Amanda Sommer, BA, ACSM CEP, Exercise Physiologist;Carroll Enterkin, RN, BSN    Supervising physician immediately available to respond to emergencies  See telemetry face sheet for immediately available ER MD    Medication changes reported      No    Fall or balance concerns reported     No    Warm-up and Cool-down  Performed on first and last piece of equipment    Resistance Training Performed  Yes    VAD Patient?  No      Pain Assessment   Currently in Pain?  No/denies          Social History   Tobacco Use  Smoking Status Never Smoker  Smokeless Tobacco Never Used    Goals Met:  Independence with exercise equipment Exercise tolerated well No report of cardiac concerns or symptoms Strength training completed today  Goals Unmet:  Not Applicable  Comments: Pt able to follow exercise prescription today without complaint.  Will continue to monitor for progression.    Dr. Mark Miller is Medical Director for [facility] and [facility]. 

## 2017-07-31 NOTE — Telephone Encounter (Signed)
Patient calling to discuss recent testing results   She is working today so leave a detailed msg of changes or directions when calling back

## 2017-07-31 NOTE — Patient Instructions (Signed)
Discharge Patient Instructions  Patient Details  Name: Miranda [name] MRN: [id number] Date of Birth: [birth year] Referring Provider:  Minna Merritts, MD   Number of Visits: 36/36  Reason for Discharge:  Patient reached a stable level of exercise. Patient independent in their exercise. Patient has met program and personal goals.  Smoking History:  Social History   Tobacco Use  Smoking Status Never Smoker  Smokeless Tobacco Never Used    Diagnosis:  Cardiac arrest [facility])  Initial Exercise Prescription: Initial Exercise Prescription - 04/28/17 1200      Date of Initial Exercise RX and Referring Provider   Date  04/28/17    Referring Provider  Ida Rogue MD      Treadmill   MPH  2.8    Grade  1    Minutes  15    METs  3.5      NuStep   Level  2    SPM  80    Minutes  15    METs  3      REL-XR   Level  2    Watts  50    Speed  2.8    Minutes  15    METs  3      Prescription Details   Frequency (times per week)  2    Duration  Progress to 45 minutes of aerobic exercise without signs/symptoms of physical distress      Intensity   THRR 40-80% of Max Heartrate  65-130    Ratings of Perceived Exertion  11-13    Perceived Dyspnea  0-4      Progression   Progression  Continue progressive overload as per policy without signs/symptoms or physical distress.      Resistance Training   Training Prescription  Yes    Weight  4    Reps  10-15       Discharge Exercise Prescription (Final Exercise Prescription Changes): Exercise Prescription Changes - 07/28/17 1600      Response to Exercise   Blood Pressure (Admit)  130/76    Blood Pressure (Exercise)  144/74    Blood Pressure (Exit)  112/64    Heart Rate (Admit)  49 bpm    Heart Rate (Exercise)  100 bpm    Heart Rate (Exit)  60 bpm    Rating of Perceived Exertion (Exercise)  12    Symptoms  none    Duration  Continue with 45 min of aerobic exercise without signs/symptoms of physical distress.     Intensity  THRR unchanged      Progression   Progression  Continue to progress workloads to maintain intensity without signs/symptoms of physical distress.    Average METs  4.54      Resistance Training   Training Prescription  Yes    Weight  4 lbs    Reps  10-15      Interval Training   Interval Training  No      Treadmill   MPH  3    Grade  2    Minutes  15    METs  4.12      NuStep   Level  4    Minutes  15    METs  3.4      REL-XR   Level  6    Minutes  15    METs  6.1      Home Exercise Plan   Plans to continue exercise at  Home (comment) walking  and gym in apartment complex    Frequency  Add 2 additional days to program exercise sessions.    Initial Home Exercises Provided  05/09/17       Functional Capacity: 6 Minute Walk    Row Name 04/28/17 1242 07/25/17 0843       6 Minute Walk   Phase  -  Discharge    Distance  1525 feet  1550 feet    Distance % Change  -  1.6 %    Distance Feet Change  -  25 ft    Walk Time  6 minutes  6 minutes    # of Rest Breaks  0  0    MPH  2.8  2.94    METS  3.5  3.74    RPE  12  12    Perceived Dyspnea   0  -    VO2 Peak  12.59  13.08    Symptoms  No  No    Resting HR  64 bpm  49 bpm    Resting BP  116/70  130/76    Resting Oxygen Saturation   100 %  95 %    Exercise Oxygen Saturation  during 6 min walk  97 %  100 %    Max Ex. HR  98 bpm  105 bpm    Max Ex. BP  146/84  144/74    2 Minute Post BP  134/72  -       Quality of Life: Quality of Life - 07/25/17 0835      Quality of Life Scores   Health/Function Pre  28.62 %    Health/Function Post  27.07 %    Health/Function % Change  -5.42 %    Socioeconomic Pre  30 %    Socioeconomic Post  27.43 %    Socioeconomic % Change   -8.57 %    Psych/Spiritual Pre  30 %    Psych/Spiritual Post  28.93 %    Psych/Spiritual % Change  -3.57 %    Family Pre  27.6 %    Family Post  25.2 %    Family % Change  -8.7 %    GLOBAL Pre  29.06 %    GLOBAL Post  27.26 %     GLOBAL % Change  -6.19 %       Personal Goals: Goals established at orientation with interventions provided to work toward goal. Personal Goals and Risk Factors at Admission - 04/28/17 1205      Core Components/Risk Factors/Patient Goals on Admission    Weight Management  Obesity;Weight Loss;Yes    Intervention  Weight Management: Develop a combined nutrition and exercise program designed to reach desired caloric intake, while maintaining appropriate intake of nutrient and fiber, sodium and fats, and appropriate energy expenditure required for the weight goal.;Weight Management/Obesity: Establish reasonable short term and long term weight goals.    Admit Weight  181 lb (82.1 kg) lost 30 pounds while in the hospital    Goal Weight: Short Term  178 lb (80.7 kg)    Goal Weight: Long Term  151 lb (68.5 kg)    Expected Outcomes  Long Term: Adherence to nutrition and physical activity/exercise program aimed toward attainment of established weight goal;Weight Loss: Understanding of general recommendations for a balanced deficit meal plan, which promotes 1-2 lb weight loss per week and includes a negative energy balance of [phone] kcal/d;Understanding of distribution of calorie intake throughout the day with the  consumption of 4-5 meals/snacks;Understanding recommendations for meals to include 15-35% energy as protein, 25-35% energy from fat, 35-60% energy from carbohydrates, less than 222m of dietary cholesterol, 20-35 gm of total fiber daily    Hypertension  Yes    Intervention  Provide education on lifestyle modifcations including regular physical activity/exercise, weight management, moderate sodium restriction and increased consumption of fresh fruit, vegetables, and low fat dairy, alcohol moderation, and smoking cessation.;Monitor prescription use compliance.    Expected Outcomes  Short Term: Continued assessment and intervention until BP is < 140/993mHG in hypertensive participants. < 130/8046mG  in hypertensive participants with diabetes, heart failure or chronic kidney disease.;Long Term: Maintenance of blood pressure at goal levels.        Personal Goals Discharge: Goals and Risk Factor Review - 07/18/17 0838      Core Components/Risk Factors/Patient Goals Review   Personal Goals Review  Weight Management/Obesity;Hypertension    Review  Eadie weight is holding steady around 178 lbs.  Her blood pressures continue to do well.  She wants to talk Dr. GolRockey Situout switching up her meds to give her more variablilty in her heart rate.     Expected Outcomes  Short: Continue to work on weight loss. Long: Continue to monitor blood pressures.        Exercise Goals and Review: Exercise Goals    Row Name 04/28/17 1253             Exercise Goals   Increase Physical Activity  Yes       Intervention  Provide advice, education, support and counseling about physical activity/exercise needs.;Develop an individualized exercise prescription for aerobic and resistive training based on initial evaluation findings, risk stratification, comorbidities and participant's personal goals.       Expected Outcomes  Achievement of increased cardiorespiratory fitness and enhanced flexibility, muscular endurance and strength shown through measurements of functional capacity and personal statement of participant.       Increase Strength and Stamina  Yes       Intervention  Provide advice, education, support and counseling about physical activity/exercise needs.;Develop an individualized exercise prescription for aerobic and resistive training based on initial evaluation findings, risk stratification, comorbidities and participant's personal goals.       Expected Outcomes  Achievement of increased cardiorespiratory fitness and enhanced flexibility, muscular endurance and strength shown through measurements of functional capacity and personal statement of participant.       Able to understand and use rate of  perceived exertion (RPE) scale  Yes       Intervention  Provide education and explanation on how to use RPE scale       Expected Outcomes  Short Term: Able to use RPE daily in rehab to express subjective intensity level;Long Term:  Able to use RPE to guide intensity level when exercising independently       Knowledge and understanding of Target Heart Rate Range (THRR)  Yes       Intervention  Provide education and explanation of THRR including how the numbers were predicted and where they are located for reference       Expected Outcomes  Short Term: Able to state/look up THRR;Short Term: Able to use daily as guideline for intensity in rehab;Long Term: Able to use THRR to govern intensity when exercising independently       Able to check pulse independently  Yes       Intervention  Provide education and demonstration on how to check pulse  in carotid and radial arteries.;Review the importance of being able to check your own pulse for safety during independent exercise       Expected Outcomes  Short Term: Able to explain why pulse checking is important during independent exercise;Long Term: Able to check pulse independently and accurately       Understanding of Exercise Prescription  Yes       Intervention  Provide education, explanation, and written materials on patient's individual exercise prescription       Expected Outcomes  Short Term: Able to explain program exercise prescription;Long Term: Able to explain home exercise prescription to exercise independently          Nutrition & Weight - Outcomes: Pre Biometrics - 04/28/17 1254      Pre Biometrics   Height  5' 1.1" (1.552 m)    Weight  181 lb 1 oz (82.1 kg)    Waist Circumference  34.5 inches    Hip Circumference  44.5 inches    Waist to Hip Ratio  0.78 %    BMI (Calculated)  34.1    Single Leg Stand  7.08 seconds      Post Biometrics - 07/25/17 0842       Post  Biometrics   Height  5' 1.1" (1.552 m)    Weight  177 lb (80.3 kg)     Waist Circumference  34.5 inches    Hip Circumference  43.5 inches    Waist to Hip Ratio  0.79 %    BMI (Calculated)  33.33    Single Leg Stand  28.5 seconds       Nutrition: Nutrition Therapy & Goals - 05/15/17 1715      Nutrition Therapy   Diet  TLC, low fiber due to pancreatic insufficiency and IBS    Protein (specify units)  6-7oz    Fruits and Vegetables  5 servings/day    Sodium  1500 grams      Personal Nutrition Goals   Nutrition Goal  Avoid hard, crunchy foods and large portions of high fiber veggies and fruits.     Personal Goal #2  Keep sodium intake to 1552m daily, an average of 5054mwith each meal.     Comments  Ms. StChambersas had significant relief of diarrhea since beginning treatment with Creon (digestive enzymes). She is working to control sodium intake. Gave FODmap diet so she can eliminate potential IBS triggers if she does have additional bouts of IBS.        Nutrition Discharge: Nutrition Assessments - 07/25/17 0835      MEDFICTS Scores   Pre Score  3    Post Score  3    Score Difference  0       Education Questionnaire Score: Knowledge Questionnaire Score - 07/25/17 0835      Knowledge Questionnaire Score   Pre Score  25/28    Post Score  26/28 reviewed with pt today       Goals reviewed with patient; copy given to patient.

## 2017-07-31 NOTE — Telephone Encounter (Signed)
See results note. Reviewed results and recommendations with patient and she verbalized understanding with no further questions at this time.

## 2017-08-01 DIAGNOSIS — I469 Cardiac arrest, cause unspecified: Secondary | ICD-10-CM

## 2017-08-01 NOTE — Progress Notes (Signed)
Daily Session Note  Patient Details  Name: Miranda [name] MRN: [id number] Date of Birth: 10-19-1960 Referring Provider:     Cardiac Rehab from 04/28/2017 in [facility]  Referring Provider  Ida Rogue MD      Encounter Date: 08/01/2017  Check In: Session Check In - 08/01/17 0853      Check-In   Location  [facility]    Staff Present  Joellyn Rued, BS, [place];Nada Maclachlan, BA, ACSM CEP, Exercise Physiologist;Susanne Bice, RN, BSN, CCRP    Supervising physician immediately available to respond to emergencies  See telemetry face sheet for immediately available ER MD    Medication changes reported      No    Fall or balance concerns reported     No    Tobacco Cessation  No Change    Warm-up and Cool-down  Performed on first and last piece of equipment    Resistance Training Performed  Yes    VAD Patient?  No      Pain Assessment   Currently in Pain?  No/denies    Multiple Pain Sites  No          Social History   Tobacco Use  Smoking Status Never Smoker  Smokeless Tobacco Never Used    Goals Met:  Independence with exercise equipment Exercise tolerated well No report of cardiac concerns or symptoms Strength training completed today  Goals Unmet:  Not Applicable  Comments:  Chieko graduated today from cardiac rehab with 33/36 sessions completed.  Details of the patient's exercise prescription and what She needs to do in order to continue the prescription and progress were discussed with patient.  Patient was given a copy of prescription and goals.  Patient verbalized understanding.  Sharlene plans to continue to exercise by utilizing the gym at her apartment as well as continuing to work.    Dr. Emily Filbert is Medical Director for [facility].

## 2017-08-04 ENCOUNTER — Ambulatory Visit: Payer: BC Managed Care – PPO

## 2017-08-06 ENCOUNTER — Encounter: Payer: Self-pay | Admitting: *Deleted

## 2017-08-06 ENCOUNTER — Ambulatory Visit: Payer: BC Managed Care – PPO

## 2017-08-06 DIAGNOSIS — I469 Cardiac arrest, cause unspecified: Secondary | ICD-10-CM

## 2017-08-06 NOTE — Progress Notes (Signed)
Cardiac Individual Treatment Plan  Patient Details  Name: Miranda [name] MRN: [id number] Date of Birth: 08-26-1960 Referring Provider:     Cardiac Rehab from 04/28/2017 in [facility] Cardiac and Pulmonary Rehab  Referring Provider  Ida Rogue MD      Initial Encounter Date:    Cardiac Rehab from 04/28/2017 in [facility] Cardiac and Pulmonary Rehab  Date  04/28/17  Referring Provider  Ida Rogue MD      Visit Diagnosis: Cardiac arrest [facility])  Patient's Home Medications on Admission:  Current Outpatient Medications:  .  aspirin EC 81 MG EC tablet, Take 1 tablet (81 mg total) by mouth daily., Disp: , Rfl:  .  carvedilol (COREG) 3.125 MG tablet, Take 1 tablet (3.125 mg total) by mouth 2 (two) times daily with a meal., Disp: 180 tablet, Rfl: 3 .  lipase/protease/amylase (CREON) 36000 UNITS CPEP capsule, Take 2 capsules (72,000 Units total) by mouth 3 (three) times daily before meals., Disp: 90 capsule, Rfl: 3 .  potassium chloride SA (K-DUR,KLOR-CON) 20 MEQ tablet, Take 1 tablet (20 mEq total) by mouth daily., Disp: 90 tablet, Rfl: 3  Past Medical History: Past Medical History:  Diagnosis Date  . Coronary artery disease, non-occlusive    a. LHC 02/25/2017: no angiographically significant CAD, EF <35%, moderately elevated LVEDP at 28 mmHg.  Marland Kitchen Dependent edema    bilateral legs  . Hot flashes   . HTN (hypertension)   . Hypokalemia   . IBS (irritable bowel syndrome)   . 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 02/25/2017: LHC without signficant CAD; b. possibly 2/2 diarrhea with electrolyte abnormalities/Imodium usage - K 2.7; c. s/p Medtronic MRI compatible dual chamber AICD, serial number [id number].  . Migraines   . Nausea   . NICM w/ recovery of LV dysfxn    a. TTE 02/26/2017: EF < 20%, diffuse HK, mild AI, RV sys fxn mildly reduced; b. TTE 03/03/2017: EF 55-60%, nl WM, mild AI, PASP 46    Tobacco Use: Social History   Tobacco Use  Smoking Status Never Smoker  Smokeless  Tobacco Never Used    Labs: Recent Review Flowsheet Data    Labs for ITP Cardiac and Pulmonary Rehab Latest Ref Rng & Units 03/06/2017 03/07/2017 03/09/2017 03/10/2017 03/15/2017   Trlycerides <150 mg/dL 329(H) 262(H) - 119 -   Hemoglobin A1c 4.8 - 5.6 % - - - - 7.6(H)   PHART 7.350 - 7.450 7.48(H) - 7.51(H) - -   PCO2ART 32.0 - 48.0 mmHg 37 - 37 - -   HCO3 20.0 - 28.0 mmol/L 27.6 - 29.5(H) - -   ACIDBASEDEF 0.0 - 2.0 mmol/L - - - - -   O2SAT % 98.8 - 97.8 - -       Exercise Target Goals:    Exercise Program Goal: Individual exercise prescription set using results from initial 6 min walk test and THRR while considering  patient's activity barriers and safety.   Exercise Prescription Goal: Initial exercise prescription builds to 30-45 minutes a day of aerobic activity, 2-3 days per week.  Home exercise guidelines will be given to patient during program as part of exercise prescription that the participant will acknowledge.  Activity Barriers & Risk Stratification: Activity Barriers & Cardiac Risk Stratification - 04/28/17 1244      Activity Barriers & Cardiac Risk Stratification   Activity Barriers  Deconditioning;Muscular Weakness;Decreased Ventricular Function    Cardiac Risk Stratification  High       6 Minute Walk: 6 Minute Walk  [place] Name 04/28/17 1242 07/25/17 0843       6 Minute Walk   Phase  -  Discharge    Distance  1525 feet  1550 feet    Distance % Change  -  1.6 %    Distance Feet Change  -  25 ft    Walk Time  6 minutes  6 minutes    # of Rest Breaks  0  0    MPH  2.8  2.94    METS  3.5  3.74    RPE  12  12    Perceived Dyspnea   0  -    VO2 Peak  12.59  13.08    Symptoms  No  No    Resting HR  64 bpm  49 bpm    Resting BP  116/70  130/76    Resting Oxygen Saturation   100 %  95 %    Exercise Oxygen Saturation  during 6 min walk  97 %  100 %    Max Ex. HR  98 bpm  105 bpm    Max Ex. BP  146/84  144/74    2 Minute Post BP  134/72  -       Oxygen  Initial Assessment:   Oxygen Re-Evaluation:   Oxygen Discharge (Final Oxygen Re-Evaluation):   Initial Exercise Prescription: Initial Exercise Prescription - 04/28/17 1200      Date of Initial Exercise RX and Referring Provider   Date  04/28/17    Referring Provider  Ida Rogue MD      Treadmill   MPH  2.8    Grade  1    Minutes  15    METs  3.5      NuStep   Level  2    SPM  80    Minutes  15    METs  3      REL-XR   Level  2    Watts  50    Speed  2.8    Minutes  15    METs  3      Prescription Details   Frequency (times per week)  2    Duration  Progress to 45 minutes of aerobic exercise without signs/symptoms of physical distress      Intensity   THRR 40-80% of Max Heartrate  65-130    Ratings of Perceived Exertion  11-13    Perceived Dyspnea  0-4      Progression   Progression  Continue progressive overload as per policy without signs/symptoms or physical distress.      Resistance Training   Training Prescription  Yes    Weight  4    Reps  10-15       Perform Capillary Blood Glucose checks as needed.  Exercise Prescription Changes: Exercise Prescription Changes    Row Name 04/28/17 1200 05/07/17 1500 05/09/17 0800 05/20/17 1400 06/04/17 1400     Response to Exercise   Blood Pressure (Admit)  116/70  124/70  -  124/64  106/68   Blood Pressure (Exercise)  146/84  130/76  -  142/88  132/70   Blood Pressure (Exit)  134/72  124/72  -  122/84  122/66   Heart Rate (Admit)  64 bpm  73 bpm  -  63 bpm  84 bpm   Heart Rate (Exercise)  98 bpm  104 bpm  -  107 bpm  106 bpm   Heart Rate (  Exit)  68 bpm  85 bpm  -  75 bpm  67 bpm   Oxygen Saturation (Admit)  100 %  -  -  -  -   Oxygen Saturation (Exercise)  97 %  -  -  -  -   Oxygen Saturation (Exit)  97 %  -  -  -  -   Rating of Perceived Exertion (Exercise)  12  12  -  11  12   Perceived Dyspnea (Exercise)  0  -  -  -  -   Symptoms  None  none  -  none  none   Comments  walk test completed   second  full day of exercise  -  -  -   Duration  Progress to 45 minutes of aerobic exercise without signs/symptoms of physical distress  Progress to 45 minutes of aerobic exercise without signs/symptoms of physical distress  -  Continue with 45 min of aerobic exercise without signs/symptoms of physical distress.  Continue with 45 min of aerobic exercise without signs/symptoms of physical distress.   Intensity  THRR New  THRR unchanged  -  THRR unchanged  THRR unchanged     Progression   Progression  -  Continue to progress workloads to maintain intensity without signs/symptoms of physical distress.  -  Continue to progress workloads to maintain intensity without signs/symptoms of physical distress.  Continue to progress workloads to maintain intensity without signs/symptoms of physical distress.   Average METs  -  3.51  -  4.37  4.34     Resistance Training   Training Prescription  -  Yes  -  Yes  Yes   Weight  -  4 lbs  -  4 lbs  4 lbs   Reps  -  10-15  -  10-15  10-15     Interval Training   Interval Training  -  No  -  No  No     Treadmill   MPH  -  2.8  -  3  3   Grade  -  1  -  1  1   Minutes  -  15  -  15  15   METs  -  3.5  -  3.71  3.71     NuStep   Level  -  2  -  4  4   Minutes  -  15  -  15  15   METs  -  2.7  -  5.9  3.2     REL-XR   Level  -  2  -  4  4   Speed  -  50  -  -  -   Minutes  -  15  -  15  15   METs  -  4.3  -  3.5  6.1     Home Exercise Plan   Plans to continue exercise at  -  -  Home (comment) walking and gym in apartment complex  Home (comment) walking and gym in apartment complex  Home (comment) walking and gym in apartment complex   Frequency  -  -  Add 2 additional days to program exercise sessions.  Add 2 additional days to program exercise sessions.  Add 2 additional days to program exercise sessions.   Initial Home Exercises Provided  -  -  05/09/17  05/09/17  05/09/17   Row Name 06/17/17 1400 07/02/17 1600 07/18/17 1400  07/28/17 1600       Response  to Exercise   Blood Pressure (Admit)  128/74  124/60  108/64  130/76    Blood Pressure (Exercise)  112/68  142/88  124/66  144/74    Blood Pressure (Exit)  130/74  110/60  104/64  112/64    Heart Rate (Admit)  56 bpm  49 bpm  60 bpm  49 bpm    Heart Rate (Exercise)  105 bpm  97 bpm  112 bpm  100 bpm    Heart Rate (Exit)  60 bpm  55 bpm  59 bpm  60 bpm    Rating of Perceived Exertion (Exercise)  _0 Symptoms  none  none  none  none    Duration  Continue with 45 min of aerobic exercise without signs/symptoms of physical distress.  Continue with 45 min of aerobic exercise without signs/symptoms of physical distress.  Continue with 45 min of aerobic exercise without signs/symptoms of physical distress.  Continue with 45 min of aerobic exercise without signs/symptoms of physical distress.    Intensity  THRR unchanged  THRR unchanged  THRR unchanged  THRR unchanged      Progression   Progression  Continue to progress workloads to maintain intensity without signs/symptoms of physical distress.  Continue to progress workloads to maintain intensity without signs/symptoms of physical distress.  Continue to progress workloads to maintain intensity without signs/symptoms of physical distress.  Continue to progress workloads to maintain intensity without signs/symptoms of physical distress.    Average METs  4.27  4.67  4.61  4.54      Resistance Training   Training Prescription  Yes  Yes  Yes  Yes    Weight  4 lbs  4 lbs  4 lbs  4 lbs    Reps  10-15  10-15  10-15  10-15      Interval Training   Interval Training  No  No  No  No      Treadmill   MPH  _1 Grade  _2 Minutes  _3 METs  3.71  4.12  4.12  4.12      NuStep   Level  _4 Minutes  _5 METs  3.3  3.5  3.3  3.4      REL-XR   Level  _6 Minutes  _7 METs  5.8  6.4  6.4  6.1      Home Exercise Plan   Plans to continue exercise at  Home  (comment) walking and gym in apartment complex  Home (comment) walking and gym in apartment complex  Home (comment) walking and gym in apartment complex  Home (comment) walking and gym in apartment complex    Frequency  Add 2 additional days to program exercise sessions.  Add 2 additional days to program exercise sessions.  Add 2 additional days to program exercise sessions.  Add 2 additional days to program exercise sessions.    Initial Home Exercises Provided  05/09/17  05/09/17  05/09/17  05/09/17       Exercise Comments: Exercise Comments  [place] Name 04/30/17 [phone]           Exercise Comments  First full day of exercise!  Patient was oriented to gym and equipment including functions, settings, policies, and procedures.  Patient's individual exercise prescription and treatment plan were reviewed.  All starting workloads were established based on the results of the 6 minute walk test done at initial orientation visit.  The plan for exercise progression was also introduced and progression will be customized based on patient's performance and goals.          Exercise Goals and Review: Exercise Goals    Row Name 04/28/17 1253             Exercise Goals   Increase Physical Activity  Yes       Intervention  Provide advice, education, support and counseling about physical activity/exercise needs.;Develop an individualized exercise prescription for aerobic and resistive training based on initial evaluation findings, risk stratification, comorbidities and participant's personal goals.       Expected Outcomes  Achievement of increased cardiorespiratory fitness and enhanced flexibility, muscular endurance and strength shown through measurements of functional capacity and personal statement of participant.       Increase Strength and Stamina  Yes       Intervention  Provide advice, education, support and counseling about physical activity/exercise needs.;Develop an individualized exercise  prescription for aerobic and resistive training based on initial evaluation findings, risk stratification, comorbidities and participant's personal goals.       Expected Outcomes  Achievement of increased cardiorespiratory fitness and enhanced flexibility, muscular endurance and strength shown through measurements of functional capacity and personal statement of participant.       Able to understand and use rate of perceived exertion (RPE) scale  Yes       Intervention  Provide education and explanation on how to use RPE scale       Expected Outcomes  Short Term: Able to use RPE daily in rehab to express subjective intensity level;Long Term:  Able to use RPE to guide intensity level when exercising independently       Knowledge and understanding of Target Heart Rate Range (THRR)  Yes       Intervention  Provide education and explanation of THRR including how the numbers were predicted and where they are located for reference       Expected Outcomes  Short Term: Able to state/look up THRR;Short Term: Able to use daily as guideline for intensity in rehab;Long Term: Able to use THRR to govern intensity when exercising independently       Able to check pulse independently  Yes       Intervention  Provide education and demonstration on how to check pulse in carotid and radial arteries.;Review the importance of being able to check your own pulse for safety during independent exercise       Expected Outcomes  Short Term: Able to explain why pulse checking is important during independent exercise;Long Term: Able to check pulse independently and accurately       Understanding of Exercise Prescription  Yes       Intervention  Provide education, explanation, and written materials on patient's individual exercise prescription       Expected Outcomes  Short Term: Able to explain program exercise prescription;Long Term: Able to explain home exercise prescription to exercise independently          Exercise Goals  Re-Evaluation : Exercise Goals Re-Evaluation    Row Name  04/30/17 0741 05/07/17 1545 05/09/17 0818 05/20/17 1443 06/04/17 1445     Exercise Goal Re-Evaluation   Exercise Goals Review  Able to understand and use rate of perceived exertion (RPE) scale;Knowledge and understanding of Target Heart Rate Range (THRR);Understanding of Exercise Prescription  -  Increase Physical Activity;Able to understand and use Dyspnea scale;Understanding of Exercise Prescription;Increase Strength and Stamina;Knowledge and understanding of Target Heart Rate Range (THRR);Able to understand and use rate of perceived exertion (RPE) scale;Able to check pulse independently  Increase Physical Activity;Increase Strength and Stamina;Understanding of Exercise Prescription  Increase Physical Activity;Increase Strength and Stamina;Understanding of Exercise Prescription   Comments  Reviewed RPE scale, THR and program prescription with pt today.  Pt voiced understanding and was given a copy of goals to take home.   Derriona is off to a good start in rehab.  She has completed two full days of exercise.  We will continue to monitor her progression.   Reviewed home exercise with pt today.  Pt plans to walk and go to gym in her apartment complex for exercise.  Reviewed THR, pulse, RPE, sign and symptoms, NTG use, and when to call 911 or MD.  Also discussed weather considerations and indoor options.  Pt voiced understanding.  Albana has been doing well in rehab.  Se is now up to level 4 on the XR and 3.0 mph on the treadmill.  We will continue to monitor her progression.   Kitty has been doing well in rehab.  She is currently out of town, but promised to walk everyday that she was gone. She also said that she would be active chasing her granddaughter around.  She is now using 4 lb weights!  We will continue to monitor her progress and increase workloads when she returns.    Expected Outcomes  Short: Use RPE daily to regulate intensity.  Long: Follow  program prescription in THR.  Short: Attend rehab regularly.  Long: Make exercise part of routine.   Short: Add in two extra days a week.  Long: Make exercise part of daily routine.   Short: Continue to try to increase workloads.  Long: Exercise more at home.   Short: Return to rehab regularly.  Long: Increase more workloads.    [place] Name 06/17/17 1402 07/02/17 1615 07/18/17 0836 07/18/17 1437 07/28/17 1611     Exercise Goal Re-Evaluation   Exercise Goals Review  Increase Physical Activity;Increase Strength and Stamina;Understanding of Exercise Prescription  Increase Physical Activity;Increase Strength and Stamina;Understanding of Exercise Prescription  Increase Physical Activity;Increase Strength and Stamina;Understanding of Exercise Prescription  Increase Physical Activity;Increase Strength and Stamina;Understanding of Exercise Prescription  Increase Physical Activity;Increase Strength and Stamina;Understanding of Exercise Prescription   Comments  Ikia continues to do well in rehab.  She exercised daily while she was out of town.  She did notice that it was not the same as what she had been doing while she was here.  However, she was able to return to her workloads without a problem.  We will continue to monitor her progression.   Cynia has been doing well in rehab. 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  She is now up to level 6 on the XR at 6 METs.  We will continue to monitor her progression.   Anniyah has been doing well in rehab.  She is thinking about going to burn rate in [place] with  her co-workers.  She knows that it will hold her accountable and she is planning to come to the [facility] as well after graduation.   -  Nicki will be graduating a week early!  She graduates this Friday.  She improved her post 6MWT by 51f.  She has done well in rehab and plans to continue to exercise by going to the gym with her  coworkers.     Expected Outcomes  Short: Continue to work on increasing workloads.  Long: Continue to exercise daily.   Short: Talk about adding in intervals. Long: Continue to make exercise part of routine.   Short: Completed post 6MWT.  Long: Continue to exercise regularly.   -  Short: Graduation!  Long: Continue to exercies regularly.       Discharge Exercise Prescription (Final Exercise Prescription Changes): Exercise Prescription Changes - 07/28/17 1600      Response to Exercise   Blood Pressure (Admit)  130/76    Blood Pressure (Exercise)  144/74    Blood Pressure (Exit)  112/64    Heart Rate (Admit)  49 bpm    Heart Rate (Exercise)  100 bpm    Heart Rate (Exit)  60 bpm    Rating of Perceived Exertion (Exercise)  12    Symptoms  none    Duration  Continue with 45 min of aerobic exercise without signs/symptoms of physical distress.    Intensity  THRR unchanged      Progression   Progression  Continue to progress workloads to maintain intensity without signs/symptoms of physical distress.    Average METs  4.54      Resistance Training   Training Prescription  Yes    Weight  4 lbs    Reps  10-15      Interval Training   Interval Training  No      Treadmill   MPH  3    Grade  2    Minutes  15    METs  4.12      NuStep   Level  4    Minutes  15    METs  3.4      REL-XR   Level  6    Minutes  15    METs  6.1      Home Exercise Plan   Plans to continue exercise at  Home (comment) walking and gym in apartment complex    Frequency  Add 2 additional days to program exercise sessions.    Initial Home Exercises Provided  05/09/17       Nutrition:  Target Goals: Understanding of nutrition guidelines, daily intake of sodium <15049m cholesterol <20071mcalories 30% from fat and 7% or less from saturated fats, daily to have 5 or more servings of fruits and vegetables.  Biometrics: Pre Biometrics - 04/28/17 1254      Pre Biometrics   Height  5' 1.1" (1.552 m)     Weight  181 lb 1 oz (82.1 kg)    Waist Circumference  34.5 inches    Hip Circumference  44.5 inches    Waist to Hip Ratio  0.78 %    BMI (Calculated)  34.1    Single Leg Stand  7.08 seconds      Post Biometrics - 07/25/17 0842       Post  Biometrics   Height  5' 1.1" (1.552 m)    Weight  177 lb (80.3 kg)    Waist Circumference  34.5 inches  Hip Circumference  43.5 inches    Waist to Hip Ratio  0.79 %    BMI (Calculated)  33.33    Single Leg Stand  28.5 seconds       Nutrition Therapy Plan and Nutrition Goals: Nutrition Therapy & Goals - 05/15/17 1715      Nutrition Therapy   Diet  TLC, low fiber due to pancreatic insufficiency and IBS    Protein (specify units)  6-7oz    Fruits and Vegetables  5 servings/day    Sodium  1500 grams      Personal Nutrition Goals   Nutrition Goal  Avoid hard, crunchy foods and large portions of high fiber veggies and fruits.     Personal Goal #2  Keep sodium intake to 1530m daily, an average of 5088mwith each meal.     Comments  Ms. StSpadaccinias had significant relief of diarrhea since beginning treatment with Creon (digestive enzymes). She is working to control sodium intake. Gave FODmap diet so she can eliminate potential IBS triggers if she does have additional bouts of IBS.        Nutrition Assessments: Nutrition Assessments - 07/25/17 0835      MEDFICTS Scores   Pre Score  3    Post Score  3    Score Difference  0       Nutrition Goals Re-Evaluation: Nutrition Goals Re-Evaluation    Row Name 06/04/17 1442 06/18/17 0740 07/18/17 0840         Goals   Current Weight  -  174 lb (78.9 kg)  178 lb (80.7 kg)     Nutrition Goal  Avoid hard, crunchy foods and large portions of high fiber veggies and fruits. Watch sodium intake  Avoid hard, crunchy foods and large portions of high fiber veggies and fruits. Watch sodium intake  Avoid hard, crunchy foods and large portions of high fiber veggies and fruits. Watch sodium intake      Comment  LoJhoselins out of town but planned to watch her diet while she is away.    LoAlexiusad a cookie while she was out of town and her granddaughter told on her!!  She enjoyed it!  She is eating more fruits and veggies and drinking more water.  She is closely monitoring her sodium intake.    LoShoshanahas been sticking back on her diet now that the holidays are over.  She enjoyed spending time with her family but is back on track now.  She continues to watch her sodium intake and portion sizes.      Expected Outcome  Short: Enjoy her vacation.  Long: Continue to follow heart healthy diet.   Short: Continue to watch diet.  Long: Maintain heart healthy diet.   Short: Continue to watch diet.  Long: Maintain heart healthy diet.         Nutrition Goals Discharge (Final Nutrition Goals Re-Evaluation): Nutrition Goals Re-Evaluation - 07/18/17 0840      Goals   Current Weight  178 lb (80.7 kg)    Nutrition Goal  Avoid hard, crunchy foods and large portions of high fiber veggies and fruits. Watch sodium intake    Comment  LoLakesaas been sticking back on her diet now that the holidays are over.  She enjoyed spending time with her family but is back on track now.  She continues to watch her sodium intake and portion sizes.     Expected Outcome  Short: Continue to watch diet.  Long: Maintain heart healthy diet.        Psychosocial: Target Goals: Acknowledge presence or absence of significant depression and/or stress, maximize coping skills, provide positive support system. Participant is able to verbalize types and ability to use techniques and skills needed for reducing stress and depression.   Initial Review & Psychosocial Screening: Initial Psych Review & Screening - 04/28/17 1206      Initial Review   Current issues with  Current Sleep Concerns Only sleeping 2-3 hours at a time. Has referral to MD for sleep study      Family Dynamics   Good Support System?  Yes Husband, son that lives at home, daughter and  a sister lives in [place]      Barriers   Psychosocial barriers to participate in program  There are no identifiable barriers or psychosocial needs.;The patient should benefit from training in stress management and relaxation.      Screening Interventions   Interventions  Encouraged to exercise;Provide feedback about the scores to participant;To provide support and resources with identified psychosocial needs       Quality of Life Scores:  Quality of Life - 07/25/17 0835      Quality of Life Scores   Health/Function Pre  28.62 %    Health/Function Post  27.07 %    Health/Function % Change  -5.42 %    Socioeconomic Pre  30 %    Socioeconomic Post  27.43 %    Socioeconomic % Change   -8.57 %    Psych/Spiritual Pre  30 %    Psych/Spiritual Post  28.93 %    Psych/Spiritual % Change  -3.57 %    Family Pre  27.6 %    Family Post  25.2 %    Family % Change  -8.7 %    GLOBAL Pre  29.06 %    GLOBAL Post  27.26 %    GLOBAL % Change  -6.19 %      Scores of 19 and below usually indicate a poorer quality of life in these areas.  A difference of  2-3 points is a clinically meaningful difference.  A difference of 2-3 points in the total score of the Quality of Life Index has been associated with significant improvement in overall quality of life, self-image, physical symptoms, and general health in studies assessing change in quality of life.  PHQ-9: Recent Review Flowsheet Data    Depression screen [facility] 2/9 07/25/2017 04/28/2017   Decreased Interest 0 0   Down, Depressed, Hopeless 0 0   PHQ - 2 Score 0 0   Altered sleeping 3 0   Tired, decreased energy 1 0   Change in appetite 0 0   Feeling bad or failure about yourself  0 0   Trouble concentrating 0 0   Moving slowly or fidgety/restless 0 0   Suicidal thoughts 0 0   PHQ-9 Score 4 0   Difficult doing work/chores Not difficult at all Not difficult at all     Interpretation of Total Score  Total Score Depression Severity:  1-4 =  Minimal depression, 5-9 = Mild depression, 10-14 = Moderate depression, 15-19 = Moderately severe depression, 20-27 = Severe depression   Psychosocial Evaluation and Intervention: Psychosocial Evaluation - 05/07/17 0935      Psychosocial Evaluation & Interventions   Interventions  Encouraged to exercise with the program and follow exercise prescription;Stress management education    Comments  Counselor met with Ms. Wille Glaser Cecille Rubin) today for initial  psychosocial evaluation.  She is a 57 year old who went into cardiac arrest on 8/21.  She has a strong support system  with a spouse of 31 years and an adult son who lives in the home as well.  Shandelle states she sleeps "horribly" and had a sleep study last night.  She reports having used essential oils in the past week which was helpful and she got almost 6 hours sleep those nights.  She will be researching this more.  Jaden has a great appetite and denies a history of depression or anxiety.  She states her mood is typically very positive and she has minimal stress in her life other than her health and not being able to drive due to her condition until March, so her son is driving her back and forth to class and once she returns to work - will do the same.  Florita has goals to increase her stamina and strength and to begin a habit of exercising.  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      Expected Outcomes  Mykael will benefit from consistent exercise to achieve her stated goals.  The educational and psychoeducational components of this program will be helpful for her to learn more about her condition and positive ways to manage this in her life.  She will see the ENT soon for her voice concerns and will update staff on the outcome of her sleep study.      Continue Psychosocial Services   Follow up required by staff       Psychosocial  Re-Evaluation: Psychosocial Re-Evaluation    Elgin Name 05/14/17 1012 06/04/17 1441 06/18/17 0744 07/16/17 1707       Psychosocial Re-Evaluation   Current issues with  Current Sleep Concerns  Current Stress Concerns  Current Stress Concerns  -    Comments  Counselor follow up with Cecille Rubin today reporting the outcome of her sleep study was that she has "insomnia" but she states the use of a diffuser with essential oils has improved her sleep to 5-6 hours/night vs. 2-3 hours - so she is pleased with that.  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  She continues to be in a positive mood and has been able to return to work part-time.  Counselor will continue to follow with her.    Norrine will be out of town for the next two weeks.  She is visiting family in [place] and seeing her granddaughter for the first time.  She was very excited to be able to go and to have the energy to be able to go!!  Islam is back to work!  She covered during the snow.  She is still frustrated not being able to drive, but plans to talk to the doctor about it this week.  She enjoyed her vacation.   She is sleeping better now then when she started the progream.    Counselor follow up with Cecille Rubin today reporting she has been back at work now for [employer] and things are going well.  She is sleeping better and longer and has more energy.  She will be completing this program in several weeks.  Addisynn reports much progress while here with her stamina and strength and is looking into a local gym for  continued exercise upon her discharge from this program.  Counselor commended Marlene on all her hard work and progress made.      Expected Outcomes  Barbee will continue to exercise consistently and continue with her sleep regimen that is working.    Short: enjoy her vacation  Long: Return to rehab and attend regularly  Short: Continue to attend classes to gain strength and talk to doctor about  driving.  Long: Continue to maintain positive attitude.   Continue to exercise consistently - even after completion of program.  Continue to practice good sleep hygiene.      Interventions  -  Encouraged to attend Cardiac Rehabilitation for the exercise;Stress management education  -  -    Continue Psychosocial Services   -  Follow up required by staff  -  -       Psychosocial Discharge (Final Psychosocial Re-Evaluation): Psychosocial Re-Evaluation - 07/16/17 1707      Psychosocial Re-Evaluation   Comments  Counselor follow up with Cecille Rubin today reporting she has been back at work now for [employer] and things are going well.  She is sleeping better and longer and has more energy.  She will be completing this program in several weeks.  Chaise reports much progress while here with her stamina and strength and is looking into a local gym for continued exercise upon her discharge from this program.  Counselor commended Dandra on all her hard work and progress made.      Expected Outcomes  Continue to exercise consistently - even after completion of program.  Continue to practice good sleep hygiene.         Vocational Rehabilitation: Provide vocational rehab assistance to qualifying candidates.   Vocational Rehab Evaluation & Intervention: Vocational Rehab - 04/28/17 1213      Initial Vocational Rehab Evaluation & Intervention   Assessment shows need for Vocational Rehabilitation  No       Education: Education Goals: Education classes will be provided on a variety of topics geared toward better understanding of heart health and risk factor modification. Participant will state understanding/return demonstration of topics presented as noted by education test scores.  Learning Barriers/Preferences: Learning Barriers/Preferences - 04/28/17 1212      Learning Barriers/Preferences   Learning Barriers  None    Learning Preferences  Verbal Instruction       Education Topics:  AED/CPR: - Group  verbal and written instruction with the use of models to demonstrate the basic use of the AED with the basic ABC's of resuscitation.   General Nutrition Guidelines/Fats and Fiber: -Group instruction provided by verbal, written material, models and posters to present the general guidelines for heart healthy nutrition. Gives an explanation and review of dietary fats and fiber.   Controlling Sodium/Reading Food Labels: -Group verbal and written material supporting the discussion of sodium use in heart healthy nutrition. Review and explanation with models, verbal and written materials for utilization of the food label.   Exercise Physiology & General Exercise Guidelines: - Group verbal and written instruction with models to review the exercise physiology of the cardiovascular system and associated critical values. Provides general exercise guidelines with specific guidelines to those with heart or lung disease.    Cardiac Rehab from 07/31/2017 in [facility] Cardiac and Pulmonary Rehab  Date  07/02/17  Educator  [facility]  Instruction Review Code  1- Verbalizes Understanding      Aerobic Exercise & Resistance Training: - Gives group verbal and written instruction on the various components  of exercise. Focuses on aerobic and resistive training programs and the benefits of this training and how to safely progress through these programs..   Cardiac Rehab from 07/31/2017 in [facility] Cardiac and Pulmonary Rehab  Date  05/07/17  Educator  [facility]  Instruction Review Code  1- Verbalizes Understanding      Flexibility, Balance, Mind/Body Relaxation: Provides group verbal/written instruction on the benefits of flexibility and balance training, including mind/body exercise modes such as yoga, pilates and tai chi.  Demonstration and skill practice provided.   Stress and Anxiety: - Provides group verbal and written instruction about the health risks of elevated stress and causes of high stress.  Discuss the correlation  between heart/lung disease and anxiety and treatment options. Review healthy ways to manage with stress and anxiety.   Cardiac Rehab from 07/31/2017 in [facility] Cardiac and Pulmonary Rehab  Date  05/21/17  Educator  [facility]  Instruction Review Code  1- Verbalizes Understanding      Depression: - Provides group verbal and written instruction on the correlation between heart/lung disease and depressed mood, treatment options, and the stigmas associated with seeking treatment.   Cardiac Rehab from 07/31/2017 in [facility] Cardiac and Pulmonary Rehab  Date  06/18/17  Educator  [facility]  Instruction Review Code  1- Verbalizes Understanding      Anatomy & Physiology of the Heart: - Group verbal and written instruction and models provide basic cardiac anatomy and physiology, with the coronary electrical and arterial systems. Review of Valvular disease and Heart Failure   Cardiac Rehab from 07/31/2017 in [facility] Cardiac and Pulmonary Rehab  Date  07/28/17  Educator  CE  Instruction Review Code  1- Verbalizes Understanding      Cardiac Procedures: - Group verbal and written instruction to review commonly prescribed medications for heart disease. Reviews the medication, class of the drug, and side effects. Includes the steps to properly store meds and maintain the prescription regimen. (beta blockers and nitrates)   Cardiac Medications I: - Group verbal and written instruction to review commonly prescribed medications for heart disease. Reviews the medication, class of the drug, and side effects. Includes the steps to properly store meds and maintain the prescription regimen.   Cardiac Rehab from 07/31/2017 in [facility] Cardiac and Pulmonary Rehab  Date  07/21/17  Educator  [facility]  Instruction Review Code  1- Verbalizes Understanding      Cardiac Medications II: -Group verbal and written instruction to review commonly prescribed medications for heart disease. Reviews the medication, class of the drug, and side effects.  (all other drug classes)   Cardiac Rehab from 07/31/2017 in [facility] Cardiac and Pulmonary Rehab  Date  07/16/17 [facility] Factors]  Educator  [employer]  Instruction Review Code  1- Verbalizes Understanding       Go Sex-Intimacy & Heart Disease, Get SMART - Goal Setting: - Group verbal and written instruction through game format to discuss heart disease and the return to sexual intimacy. Provides group verbal and written material to discuss and apply goal setting through the application of the S.M.A.R.T. Method.   Other Matters of the Heart: - Provides group verbal, written materials and models to describe Stable Angina and Peripheral Artery. Includes description of the disease process and treatment options available to the cardiac patient.   Cardiac Rehab from 07/31/2017 in [facility] Cardiac and Pulmonary Rehab  Date  07/31/17  Educator  CE  Instruction Review Code  1- Verbalizes Understanding      Exercise & Equipment Safety: - Individual  verbal instruction and demonstration of equipment use and safety with use of the equipment.   Cardiac Rehab from 07/31/2017 in [facility] Cardiac and Pulmonary Rehab  Date  04/28/17  Educator  [facility]  Instruction Review Code  1- Verbalizes Understanding      Infection Prevention: - Provides verbal and written material to individual with discussion of infection control including proper hand washing and proper equipment cleaning during exercise session.   Cardiac Rehab from 07/31/2017 in [facility] Cardiac and Pulmonary Rehab  Date  04/28/17  Educator  [facility]  Instruction Review Code  1- Verbalizes Understanding      Falls Prevention: - Provides verbal and written material to individual with discussion of falls prevention and safety.   Cardiac Rehab from 07/31/2017 in [facility] Cardiac and Pulmonary Rehab  Date  04/28/17  Educator  SB  Instruction Review Code  1- Verbalizes Understanding      Diabetes: - Individual verbal and written instruction to review signs/symptoms of  diabetes, desired ranges of glucose level fasting, after meals and with exercise. Acknowledge that pre and post exercise glucose checks will be done for 3 sessions at entry of program.   Know Your Numbers and Risk Factors: -Group verbal and written instruction about important numbers in your health.  Discussion of what are risk factors and how they play a role in the disease process.  Review of Cholesterol, Blood Pressure, Diabetes, and BMI and the role they play in your overall health.   Cardiac Rehab from 07/31/2017 in [facility] Cardiac and Pulmonary Rehab  Date  07/16/17 [facility] Factors]  Educator  [employer]  Instruction Review Code  1- [employer] Understanding      Sleep Hygiene: -Provides group verbal and written instruction about how sleep can affect your health.  Define sleep hygiene, discuss sleep cycles and impact of sleep habits. Review good sleep hygiene tips.    Other: -Provides group and verbal instruction on various topics (see comments)   Knowledge Questionnaire Score: Knowledge Questionnaire Score - 07/25/17 0835      Knowledge Questionnaire Score   Pre Score  25/28    Post Score  26/28 reviewed with pt today       Core Components/Risk Factors/Patient Goals at Admission: Personal Goals and Risk Factors at Admission - 04/28/17 1205      Core Components/Risk Factors/Patient Goals on Admission    Weight Management  Obesity;Weight Loss;Yes    Intervention  Weight Management: Develop a combined nutrition and exercise program designed to reach desired caloric intake, while maintaining appropriate intake of nutrient and fiber, sodium and fats, and appropriate energy expenditure required for the weight goal.;Weight Management/Obesity: Establish reasonable short term and long term weight goals.    Admit Weight  181 lb (82.1 kg) lost 30 pounds while in the hospital    Goal Weight: Short Term  178 lb (80.7 kg)    Goal Weight: Long Term  151 lb (68.5 kg)    Expected Outcomes  Long Term:  Adherence to nutrition and physical activity/exercise program aimed toward attainment of established weight goal;Weight Loss: Understanding of general recommendations for a balanced deficit meal plan, which promotes 1-2 lb weight loss per week and includes a negative energy balance of [phone] kcal/d;Understanding of distribution of calorie intake throughout the day with the consumption of 4-5 meals/snacks;Understanding recommendations for meals to include 15-35% energy as protein, 25-35% energy from fat, 35-60% energy from carbohydrates, less than 265m of dietary cholesterol, 20-35 gm of total fiber daily    Hypertension  Yes    Intervention  Provide education on lifestyle modifcations including regular physical activity/exercise, weight management, moderate sodium restriction and increased consumption of fresh fruit, vegetables, and low fat dairy, alcohol moderation, and smoking cessation.;Monitor prescription use compliance.    Expected Outcomes  Short Term: Continued assessment and intervention until BP is < 140/73m HG in hypertensive participants. < 130/8107mHG in hypertensive participants with diabetes, heart failure or chronic kidney disease.;Long Term: Maintenance of blood pressure at goal levels.       Core Components/Risk Factors/Patient Goals Review:  Goals and Risk Factor Review    Row Name 06/04/17 1443 06/18/17 0739 07/18/17 0838         Core Components/Risk Factors/Patient Goals Review   Personal Goals Review  Weight Management/Obesity;Hypertension  Weight Management/Obesity;Hypertension  Weight Management/Obesity;Hypertension     Review  LoTrentonas been doing well in rehab.  She is currently out of town visiting family. She has reached her short term weight loss goal and hopes to maintain that even while away.  Her blood pressures have been good in rehab and she does check them some at home.   LoJontaes doing well in rehab. Her weight is coming down; she is down to 174 lbs today.  Her  blood pressures continue to do well.    Jermani weight is holding steady around 178 lbs.  Her blood pressures continue to do well.  She wants to talk Dr. GoRockey Situbout switching up her meds to give her more variablilty in her heart rate.      Expected Outcomes  Short: Continue to work on weight loss.  Long: Continue to monitor her risk factors.   Short: Continue to work on weight loss.  Long: Continue to monitor her risk factors.   Short: Continue to work on weight loss. Long: Continue to monitor blood pressures.         Core Components/Risk Factors/Patient Goals at Discharge (Final Review):  Goals and Risk Factor Review - 07/18/17 0838      Core Components/Risk Factors/Patient Goals Review   Personal Goals Review  Weight Management/Obesity;Hypertension    Review  Emmer weight is holding steady around 178 lbs.  Her blood pressures continue to do well.  She wants to talk Dr. GoRockey Situbout switching up her meds to give her more variablilty in her heart rate.     Expected Outcomes  Short: Continue to work on weight loss. Long: Continue to monitor blood pressures.        ITP Comments: ITP Comments    Row Name 04/28/17 1202 05/14/17 0631 06/04/17 1440 06/11/17 0558 07/09/17 0640   ITP Comments   Medical review completed today.  Initial ITP created for review by Medical Director and changes as necessary.   [facility]  30 day review. Continue with ITP unless directed changes per Medical Director review.  New to program  LoKaeganill be out of town for the next two weeks.  [place].  She was very excited to be able to go and to have the energy to be able to go!!  30 day review. Continue with ITP unless directed changes per Medical Director review.   30 day review. Continue with ITP unless directed changes per Medical Director review.    [place] 08/06/17 0958           ITP Comments   30 day  review. Continue with ITP unless directed changes per Medical Director review.   Discharged          Comments:

## 2017-08-08 ENCOUNTER — Ambulatory Visit: Payer: BC Managed Care – PPO

## 2017-08-11 ENCOUNTER — Ambulatory Visit: Payer: BC Managed Care – PPO

## 2017-08-13 ENCOUNTER — Ambulatory Visit: Payer: BC Managed Care – PPO

## 2017-08-15 ENCOUNTER — Ambulatory Visit: Payer: BC Managed Care – PPO

## 2017-08-18 ENCOUNTER — Ambulatory Visit: Payer: BC Managed Care – PPO

## 2017-08-19 ENCOUNTER — Encounter: Payer: Self-pay | Admitting: Cardiovascular Disease

## 2017-08-20 ENCOUNTER — Ambulatory Visit: Payer: BC Managed Care – PPO

## 2017-08-22 ENCOUNTER — Ambulatory Visit: Payer: BC Managed Care – PPO

## 2017-08-25 ENCOUNTER — Other Ambulatory Visit: Payer: Self-pay | Admitting: *Deleted

## 2017-08-25 ENCOUNTER — Ambulatory Visit: Payer: BC Managed Care – PPO

## 2017-08-25 MED ORDER — METOPROLOL SUCCINATE ER 25 MG PO TB24: 12.5000 mg | ORAL_TABLET | Freq: Every day | ORAL | 3 refills | Status: DC

## 2017-08-27 ENCOUNTER — Ambulatory Visit: Payer: BC Managed Care – PPO

## 2017-08-29 ENCOUNTER — Ambulatory Visit: Payer: BC Managed Care – PPO

## 2017-09-01 ENCOUNTER — Ambulatory Visit: Payer: BC Managed Care – PPO

## 2017-09-02 ENCOUNTER — Encounter: Payer: Self-pay | Admitting: Cardiovascular Disease

## 2017-09-12 ENCOUNTER — Telehealth: Payer: Self-pay | Admitting: *Deleted

## 2017-09-12 NOTE — Telephone Encounter (Signed)
From: Julien Nordmann, MD  Sent: 09/12/17 12:15  To: [name]  Subject: RE: RE: Non-Urgent Medical Question    Would recommend stopping the carvedilol   Start low-dose metoprolol succinate as we did before if cough is getting better on metoprolol     It does seem unusual that holding such a low-dose carvedilol would cause a spike in blood pressure. Would check more than 1 blood pressure as stress anxiety or pain can run numbers high. Look for trend not just 1 number     For elevated blood pressure we can use losartan 25 mg   If blood pressure runs high would increase slowly up to 50   Max dose is 100     On the losartan likely will not need potassium   After she has been on metoprolol and losartan for several weeks, 2 or 3   We can check basic metabolic panel for potassium level     Nurses will hopefully call this afternoon to see what you would like to do    thx  TG           ----- Message -----   From: [name]   Sent: 09/09/2017 9:25 AM EST    To: Julien Nordmann, MD  Subject: RE: RE: Non-Urgent Medical Question    do you think I need a chest xray just to rule out everything else, also I have been on 10 meq of kdur , since my last k was high, should I have another k level as well ?? thanks   ----- Message -----  From: Nurse Evlyn Kanner A  Sent: 09/08/2017 3:50 PM EST  To: [name]  Subject: RE: RE: Non-Urgent Medical Question  Sent message over to Dr. Mariah Milling for review and will let you know if he should have any other recommendations.    Thanks,  Blue Diamond Cellar RN, BSN  [facility]  [phone]      ----- Message -----   From: [name]   Sent: 09/07/2017 3:25 PM EST    To: Julien Nordmann, MD  Subject: RE: RE: Non-Urgent Medical Question      When I did the metop it was almost gone, after 3 Coreg doses its back    ----- Message -----  From: Julien Nordmann, MD  Sent: 09/07/17 15:18  To: [name]  Subject: RE: Non-Urgent Medical  Question    Would stop coreg and see if cough goes away  Cough may be from something else?  thx  TG      ----- Message -----   From: [name]   Sent: 09/02/2017 9:21 AM EST    To: Julien Nordmann, MD  Subject: Non-Urgent Medical Question    Hi guys,  I tried metoprolol vs coreg and I had B/P s of 170/90's and ankle edema. I stopped it and went back to my coreg. Any further thoughts   Thanks, Rhyannon Barrineau   [phone]

## 2017-09-12 NOTE — Telephone Encounter (Signed)
I left a message for the patient to call. 

## 2017-09-12 NOTE — Telephone Encounter (Signed)
Patient returned call. She reports cough has improved once she switched from coreg to metoprolol 12.5mg  qd. Pressures have improved - she felt to have been under stress when BP was running high.  She would like to continue to monitor BP and HR and call if either is consistently elevated.

## 2017-09-18 ENCOUNTER — Ambulatory Visit (INDEPENDENT_AMBULATORY_CARE_PROVIDER_SITE_OTHER): Payer: BC Managed Care – PPO | Admitting: *Deleted

## 2017-09-18 DIAGNOSIS — I469 Cardiac arrest, cause unspecified: Secondary | ICD-10-CM | POA: Diagnosis not present

## 2017-09-18 NOTE — Progress Notes (Signed)
Remote ICD transmission.   

## 2017-09-19 ENCOUNTER — Encounter: Payer: Self-pay | Admitting: Cardiology

## 2017-09-30 LAB — CUP PACEART REMOTE DEVICE CHECK
Battery Remaining Longevity: 136 mo
Battery Voltage: 3.12 V
Brady Statistic RV Percent Paced: 0.02 %
Date Time Interrogation Session: 20190314092910
HighPow Impedance: 67 Ohm
Implantable Lead Location: 753860
Implantable Pulse Generator Implant Date: 20180910
Lead Channel Impedance Value: 532 Ohm
Lead Channel Pacing Threshold Pulse Width: 0.4 ms
Lead Channel Setting Pacing Amplitude: 2.5 V
Lead Channel Setting Pacing Pulse Width: 0.4 ms
MDC IDC LEAD IMPLANT DT: 20180910
MDC IDC MSMT LEADCHNL RV IMPEDANCE VALUE: 456 Ohm
MDC IDC MSMT LEADCHNL RV PACING THRESHOLD AMPLITUDE: 0.5 V
MDC IDC MSMT LEADCHNL RV SENSING INTR AMPL: 9.375 mV
MDC IDC MSMT LEADCHNL RV SENSING INTR AMPL: 9.375 mV
MDC IDC SET LEADCHNL RV SENSING SENSITIVITY: 0.3 mV

## 2017-10-14 ENCOUNTER — Other Ambulatory Visit: Payer: Self-pay | Admitting: Gastroenterology

## 2017-10-14 DIAGNOSIS — R197 Diarrhea, unspecified: Secondary | ICD-10-CM

## 2017-10-16 ENCOUNTER — Other Ambulatory Visit: Payer: Self-pay | Admitting: Cardiovascular Disease

## 2017-10-16 ENCOUNTER — Encounter: Payer: Self-pay | Admitting: Cardiovascular Disease

## 2017-10-16 MED ORDER — LOSARTAN POTASSIUM 25 MG PO TABS: 25.0000 mg | ORAL_TABLET | Freq: Every day | ORAL | 6 refills | Status: DC

## 2017-11-25 ENCOUNTER — Encounter: Payer: Self-pay | Admitting: Cardiovascular Disease

## 2017-11-25 ENCOUNTER — Telehealth: Payer: Self-pay | Admitting: Cardiovascular Disease

## 2017-11-25 MED ORDER — METOPROLOL SUCCINATE ER 25 MG PO TB24: 25.0000 mg | ORAL_TABLET | Freq: Two times a day (BID) | ORAL | 3 refills | Status: DC

## 2017-11-25 NOTE — Telephone Encounter (Signed)
Patient calling back in due to mychart message and needing clarification for her medication request. She reports that Dr. Mariah Milling had increased her to once daily metoprolol and then to twice daily due to continued elevation in her blood pressures. Reviewed heart rates and she reports that they are now running in the 60-70's range. Will send in refill for dose she is currently taking which has been working for her. She verbalized understanding with no further questions at this time.

## 2017-12-18 ENCOUNTER — Ambulatory Visit (INDEPENDENT_AMBULATORY_CARE_PROVIDER_SITE_OTHER): Payer: BC Managed Care – PPO | Admitting: *Deleted

## 2017-12-18 DIAGNOSIS — I4901 Ventricular fibrillation: Secondary | ICD-10-CM

## 2017-12-18 DIAGNOSIS — I469 Cardiac arrest, cause unspecified: Secondary | ICD-10-CM | POA: Diagnosis not present

## 2017-12-18 NOTE — Progress Notes (Signed)
Remote ICD transmission.   

## 2017-12-23 LAB — CUP PACEART REMOTE DEVICE CHECK
Battery Remaining Longevity: 135 mo
Battery Voltage: 3.08 V
HighPow Impedance: 72 Ohm
Implantable Lead Location: 753860
Lead Channel Impedance Value: 570 Ohm
Lead Channel Pacing Threshold Pulse Width: 0.4 ms
Lead Channel Setting Pacing Amplitude: 2.5 V
Lead Channel Setting Pacing Pulse Width: 0.4 ms
MDC IDC LEAD IMPLANT DT: 20180910
MDC IDC MSMT LEADCHNL RV IMPEDANCE VALUE: 494 Ohm
MDC IDC MSMT LEADCHNL RV PACING THRESHOLD AMPLITUDE: 0.625 V
MDC IDC MSMT LEADCHNL RV SENSING INTR AMPL: 9.75 mV
MDC IDC MSMT LEADCHNL RV SENSING INTR AMPL: 9.75 mV
MDC IDC PG IMPLANT DT: 20180910
MDC IDC SESS DTM: 20190613093709
MDC IDC SET LEADCHNL RV SENSING SENSITIVITY: 0.3 mV
MDC IDC STAT BRADY RV PERCENT PACED: 0.34 %

## 2017-12-30 ENCOUNTER — Encounter: Payer: Self-pay | Admitting: Internal Medicine

## 2018-03-17 ENCOUNTER — Encounter: Payer: Self-pay | Admitting: Internal Medicine

## 2018-03-17 ENCOUNTER — Ambulatory Visit (INDEPENDENT_AMBULATORY_CARE_PROVIDER_SITE_OTHER): Payer: BC Managed Care – PPO | Admitting: Internal Medicine

## 2018-03-17 VITALS — BP 140/70 | HR 46 | Ht 60.0 in | Wt 188.2 lb

## 2018-03-17 DIAGNOSIS — Z95810 Presence of automatic (implantable) cardiac defibrillator: Secondary | ICD-10-CM

## 2018-03-17 DIAGNOSIS — I469 Cardiac arrest, cause unspecified: Secondary | ICD-10-CM | POA: Diagnosis not present

## 2018-03-17 DIAGNOSIS — R001 Bradycardia, unspecified: Secondary | ICD-10-CM | POA: Diagnosis not present

## 2018-03-17 NOTE — Progress Notes (Signed)
Patient Care Team: Soles, Willaim Rayas, MD as PCP - General (Family Medicine) Mariah Milling Tollie Pizza, MD as Consulting Physician (Cardiology) Merwyn Katos, MD as Consulting Physician (Pulmonary Disease)   HPI  Miranda [name] is a 57 y.o. 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  Evaluation at that time included an MRI showing mild epicardial gadolinium uptake in the distal anterior wall and inferior base-- thought consistent with myocarditis  DATE TEST EF   8/18 LHC  25 % Cors Normal  8/18 Echo   25>>60% %   9/18 cMRI 70% LGE distal anterior wall and base    She had interval normalization of LV function and QT intervals.   The patient denies chest pain, shortness of breath, nocturnal dyspnea, orthopnea or peripheral edema.  There have been no palpitations, lightheadedness or syncope.  Playing kickball  Date Cr K  10/18 0.67 4.4  1/19  0.7 5.3       Past Medical History:  Diagnosis Date  . Coronary artery disease, non-occlusive    a. LHC 02/25/2017: no angiographically significant CAD, EF <35%, moderately elevated LVEDP at 28 mmHg.  Marland Kitchen Dependent edema    bilateral legs  . Hot flashes   . HTN (hypertension)   . Hypokalemia   . IBS (irritable bowel syndrome)   . Idiopathic Ventricular fibrillation (HCC)    a. 02/25/2017: LHC without signficant CAD; b. possibly 2/2 diarrhea with electrolyte abnormalities/Imodium usage - K 2.7; c. s/p Medtronic MRI compatible dual chamber AICD, serial number [id number].  . Migraines   . Nausea   . NICM w/ recovery of LV dysfxn    a. TTE 02/26/2017: EF < 20%, diffuse HK, mild AI, RV sys fxn mildly reduced; b. TTE 03/03/2017: EF 55-60%, nl WM, mild AI, PASP 46    Past Surgical History:  Procedure Laterality Date  . APPENDECTOMY  1974  . CHOLECYSTECTOMY  1984  . COLONOSCOPY WITH PROPOFOL N/A 05/22/2017   Procedure: COLONOSCOPY WITH PROPOFOL;  Surgeon: Wyline Mood, MD;  Location: [facility]  ENDOSCOPY;  Service: Gastroenterology;  Laterality: N/A;  . ICD IMPLANT N/A 03/17/2017   Procedure: ICD Implant;  Surgeon: Duke Salvia, MD;  Location: [facility] INVASIVE CV LAB;  Service: Cardiovascular;  Laterality: N/A;  . LEFT HEART CATH AND CORONARY ANGIOGRAPHY N/A 02/26/2017   Procedure: LEFT HEART CATH AND CORONARY ANGIOGRAPHY;  Surgeon: Yvonne Kendall, MD;  Location: [facility] INVASIVE CV LAB;  Service: Cardiovascular;  Laterality: N/A;  . TONSILLECTOMY  1964    Current Outpatient Medications  Medication Sig Dispense Refill  . aspirin EC 81 MG EC tablet Take 1 tablet (81 mg total) by mouth daily.    Marland Kitchen CREON 36000 units CPEP capsule TAKE 2 CAPSULES (72,000 UNITS TOTAL) BY MOUTH 3 (THREE) TIMES DAILY BEFORE MEALS. 90 capsule 3  . losartan (COZAAR) 25 MG tablet Take 1 tablet (25 mg total) by mouth daily. 30 tablet 6  . metoprolol succinate (TOPROL-XL) 25 MG 24 hr tablet Take 25 mg by mouth daily.    . potassium chloride SA (K-DUR,KLOR-CON) 20 MEQ tablet Take 1 tablet (20 mEq total) by mouth daily. 90 tablet 3   No current facility-administered medications for this visit.     Allergies  Allergen Reactions  . Penicillins Other (See Comments) and Shortness Of Breath    Has patient had a PCN reaction causing immediate rash, facial/tongue/throat swelling, SOB or lightheadedness with hypotension: Unknown Has patient had a PCN  reaction causing severe rash involving mucus membranes or skin necrosis: Unknown Has patient had a PCN reaction that required hospitalization: Unknown Has patient had a PCN reaction occurring within the last 10 years: Unknown If all of the above answers are "NO", then may proceed with Cephalosporin use.   Marland Kitchen Prochlorperazine Shortness Of Breath  . Ace Inhibitors   . Compazine [Prochlorperazine Edisylate]   . Lisinopril Other (See Comments)  . Prochlorperazine Maleate Other (See Comments)      Review of Systems negative except from HPI and PMH  Physical Exam BP  140/70 (BP Location: Left Arm, Patient Position: Sitting, Cuff Size: Normal)   Pulse (!) 46   Ht 5' (1.524 m)   Wt 188 lb 4 oz (85.4 kg)   BMI 36.77 kg/m  Well developed and nourished in no acute distress HENT normal Neck supple with JVP-flat Clear Device pocket well healed; without hematoma or erythema.  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  The information was reviewed. No changes were made in the programming.     Sinus bradycardia  Bradycardia is asymptomatic;  we will continue beta-blockers for right now.  Data suggests that recovery of LV function is protected by the ongoing use of ACE/ARB and beta-blockers.  However, given the rapid recovery of her LV function, I wonder whether the initial ejection fraction was simply post arrest and not really reflective of a cardiomyopathy.  Against this however, was the finding on cMRI of delayed gadolinium enhancement    Current medicines are reviewed at length with the patient today .  The patient does not  have concerns regarding medicines.

## 2018-03-17 NOTE — Patient Instructions (Signed)
Medication Instructions: - Your physician recommends that you continue on your current medications as directed. Please refer to the Current Medication list given to you today.  Labwork: - - Your physician recommends that you have lab work today: [employer]  Procedures/Testing: - none ordered  Follow-Up: - Remote monitoring is used to monitor your Pacemaker of ICD from home. This monitoring reduces the number of office visits required to check your device to one time per year. It allows [place] to keep an eye on the functioning of your device to ensure it is working properly. You are scheduled for a device check from home on 03/19/18. You may send your transmission at any time that day. If you have a wireless device, the transmission will be sent automatically. After your physician reviews your transmission, you will receive a postcard with your next transmission date.   - Your physician wants you to follow-up in: 1 year with Dr. Graciela Husbands. You will receive a reminder letter in the mail two months in advance. If you don't receive a letter, please call our office to schedule the follow-up appointment.   Any Additional Special Instructions Will Be Listed Below (If Applicable).     If you need a refill on your cardiac medications before your next appointment, please call your pharmacy.

## 2018-03-18 LAB — BASIC METABOLIC PANEL
BUN/Creatinine Ratio: 28 — ABNORMAL HIGH (ref 9–23)
BUN: 17 mg/dL (ref 6–24)
CALCIUM: 9.5 mg/dL (ref 8.7–10.2)
CO2: 20 mmol/L (ref 20–29)
Chloride: 107 mmol/L — ABNORMAL HIGH (ref 96–106)
Creatinine, Ser: 0.61 mg/dL (ref 0.57–1.00)
GFR calc Af Amer: 115 mL/min/{1.73_m2} (ref >59)
GFR calc non Af Amer: 100 mL/min/{1.73_m2} (ref >59)
GLUCOSE: 93 mg/dL (ref 65–99)
Potassium: 4.1 mmol/L (ref 3.5–5.2)
Sodium: 145 mmol/L — ABNORMAL HIGH (ref 134–144)

## 2018-03-19 ENCOUNTER — Ambulatory Visit (INDEPENDENT_AMBULATORY_CARE_PROVIDER_SITE_OTHER): Payer: BC Managed Care – PPO | Admitting: *Deleted

## 2018-03-19 ENCOUNTER — Telehealth: Payer: Self-pay

## 2018-03-19 DIAGNOSIS — I469 Cardiac arrest, cause unspecified: Secondary | ICD-10-CM

## 2018-03-19 NOTE — Telephone Encounter (Signed)
LMOVM reminding pt to send remote transmission.   

## 2018-03-20 NOTE — Progress Notes (Signed)
Remote ICD transmission.   

## 2018-04-01 LAB — CUP PACEART INCLINIC DEVICE CHECK
Brady Statistic RV Percent Paced: 0.4 %
Date Time Interrogation Session: 20190925114903
HighPow Impedance: 72 Ohm
Implantable Lead Location: 753860
Lead Channel Impedance Value: 570 Ohm
Lead Channel Pacing Threshold Amplitude: 0.5 V
Lead Channel Pacing Threshold Pulse Width: 0.4 ms
MDC IDC LEAD IMPLANT DT: 20180910
MDC IDC MSMT LEADCHNL RV SENSING INTR AMPL: 11.4 mV
MDC IDC PG IMPLANT DT: 20180910

## 2018-04-07 ENCOUNTER — Ambulatory Visit
Admission: RE | Admit: 2018-04-07 | Discharge: 2018-04-07 | Disposition: A | Discharge status: Home / Self Care | Payer: BC Managed Care – PPO | Source: Ambulatory Visit | Attending: Cardiovascular Disease | Admitting: Cardiovascular Disease

## 2018-04-07 ENCOUNTER — Telehealth: Payer: Self-pay | Admitting: Cardiovascular Disease

## 2018-04-07 ENCOUNTER — Ambulatory Visit
Admission: RE | Admit: 2018-04-07 | Discharge: 2018-04-07 | Disposition: A | Discharge status: Home / Self Care | Payer: BC Managed Care – PPO | Source: Ambulatory Visit | Attending: Nurse Practitioner | Admitting: Nurse Practitioner

## 2018-04-07 DIAGNOSIS — R0602 Shortness of breath: Secondary | ICD-10-CM | POA: Insufficient documentation

## 2018-04-07 DIAGNOSIS — R05 Cough: Secondary | ICD-10-CM | POA: Insufficient documentation

## 2018-04-07 DIAGNOSIS — R059 Cough, unspecified: Secondary | ICD-10-CM

## 2018-04-07 NOTE — Telephone Encounter (Signed)
I called and spoke with the patient.  She states that she started to develop SOB Friday night at work. She has continued to have some SOB with walking and has developed a cough. Cough is mostly dry, but she has had intermittent episodes of white to clear phlegm.  She has had some intermittent lower extremity swelling that will improve with elevation of the lower extremities.  Her weight has been stable.  BP (HR) today 150/76 (40's). Per the patient, these readings are quite normal for her.   Discussed with Ward Givens, NP. Will send the patient for a chest x-ray today. The patient is aware and agreeable.  Order placed. Patient instructed to go today to have this done.

## 2018-04-07 NOTE — Telephone Encounter (Signed)
Pt c/o Shortness Of Breath: STAT if SOB developed within the last 24 hours or pt is noticeably SOB on the phone  1. Are you currently SOB (can you hear that pt is SOB on the phone)? Yes   2. How long have you been experiencing SOB?  Friday Night   3. Are you SOB when sitting or when up moving around? Mostly on exertion   4. Are you currently experiencing any other symptoms? Coughing swelling in ankles

## 2018-04-07 NOTE — Telephone Encounter (Signed)
lmov to fu on mychart request   More Detail >>  Appointment Request  Miranda [name]  Sent: Tue April 07, 2018 8:40 AM  To: Vida Rigger [facility] Scheduling   Miranda [name]  MRN: [id number] DOB: Jul 24, 1960  Pt Home: [phone]    Entered: [phone]      Message   Appointment Request From: [name]    With Provider: Julien Nordmann, MD [facility] [place]]    Preferred Date Range: Any date 04/10/2018 or later    Preferred Times: Any time    Reason for visit: Request an Appointment    Comments:  some ankle swelling, cough, SOB. off on Friday

## 2018-04-08 LAB — CUP PACEART REMOTE DEVICE CHECK
Battery Voltage: 3.05 V
Brady Statistic RV Percent Paced: 0.02 %
Date Time Interrogation Session: 20190913010254
HighPow Impedance: 72 Ohm
Implantable Lead Implant Date: 20180910
Implantable Lead Location: 753860
Implantable Pulse Generator Implant Date: 20180910
Lead Channel Pacing Threshold Amplitude: 0.5 V
Lead Channel Pacing Threshold Pulse Width: 0.4 ms
Lead Channel Sensing Intrinsic Amplitude: 9.125 mV
Lead Channel Setting Pacing Amplitude: 2.5 V
Lead Channel Setting Sensing Sensitivity: 0.3 mV
MDC IDC MSMT BATTERY REMAINING LONGEVITY: 133 mo
MDC IDC MSMT LEADCHNL RV IMPEDANCE VALUE: 532 Ohm
MDC IDC MSMT LEADCHNL RV IMPEDANCE VALUE: 570 Ohm
MDC IDC MSMT LEADCHNL RV SENSING INTR AMPL: 9.125 mV
MDC IDC SET LEADCHNL RV PACING PULSEWIDTH: 0.4 ms

## 2018-04-08 NOTE — Telephone Encounter (Signed)
I left a message for the patient to call back on her cell # regarding her chest x-ray results.

## 2018-04-09 NOTE — Telephone Encounter (Signed)
Patient returning call.   Please send msg through mychart as patient is working and often unable to answer phone.

## 2018-04-09 NOTE — Telephone Encounter (Signed)
Chest xray results sent to patient via MyChart as she requested.

## 2018-04-09 NOTE — Telephone Encounter (Signed)
No answer. Left message to call back.   

## 2018-04-15 ENCOUNTER — Other Ambulatory Visit: Payer: Self-pay | Admitting: Cardiovascular Disease

## 2018-05-13 ENCOUNTER — Telehealth: Payer: Self-pay

## 2018-05-13 MED ORDER — POTASSIUM CHLORIDE CRYS ER 20 MEQ PO TBCR: 20.0000 meq | EXTENDED_RELEASE_TABLET | Freq: Every day | ORAL | 2 refills | Status: DC

## 2018-05-13 NOTE — Telephone Encounter (Signed)
RX for Klpr-con  sent to pharmacy

## 2018-06-18 ENCOUNTER — Ambulatory Visit (INDEPENDENT_AMBULATORY_CARE_PROVIDER_SITE_OTHER): Payer: BC Managed Care – PPO

## 2018-06-18 ENCOUNTER — Telehealth: Payer: Self-pay | Admitting: Cardiology

## 2018-06-18 DIAGNOSIS — I469 Cardiac arrest, cause unspecified: Secondary | ICD-10-CM

## 2018-06-18 NOTE — Telephone Encounter (Signed)
LMOVM reminding pt to send remote transmission.   

## 2018-06-19 ENCOUNTER — Encounter: Payer: Self-pay | Admitting: Cardiology

## 2018-06-19 NOTE — Progress Notes (Signed)
Remote ICD transmission.   

## 2018-08-01 LAB — CUP PACEART REMOTE DEVICE CHECK
Battery Remaining Longevity: 132 mo
Battery Voltage: 3.04 V
Date Time Interrogation Session: 20191213002425
HIGH POWER IMPEDANCE MEASURED VALUE: 80 Ohm
Implantable Lead Implant Date: 20180910
Implantable Pulse Generator Implant Date: 20180910
Lead Channel Impedance Value: 532 Ohm
Lead Channel Pacing Threshold Amplitude: 0.5 V
Lead Channel Sensing Intrinsic Amplitude: 9.5 mV
Lead Channel Setting Pacing Amplitude: 2.5 V
Lead Channel Setting Pacing Pulse Width: 0.4 ms
MDC IDC LEAD LOCATION: 753860
MDC IDC MSMT LEADCHNL RV IMPEDANCE VALUE: 589 Ohm
MDC IDC MSMT LEADCHNL RV PACING THRESHOLD PULSEWIDTH: 0.4 ms
MDC IDC MSMT LEADCHNL RV SENSING INTR AMPL: 9.5 mV
MDC IDC SET LEADCHNL RV SENSING SENSITIVITY: 0.3 mV
MDC IDC STAT BRADY RV PERCENT PACED: 0.09 %

## 2018-08-31 ENCOUNTER — Telehealth: Payer: Self-pay

## 2018-08-31 NOTE — Telephone Encounter (Signed)
LMOVM for patient to clarify the directions for Metoprolol 25 mg.  Refill request  From [facility] is for Metoprolol 25 mg. Twice a day. Last office visit is Metoprolol 25 mg daily.

## 2018-09-01 NOTE — Telephone Encounter (Signed)
Patient returned phone call, and did clarify that she is taking the Metoprolol 25 mg once a day. Her prescription has been filled.

## 2018-09-01 NOTE — Telephone Encounter (Signed)
Pt is returning your call

## 2018-09-09 NOTE — Progress Notes (Signed)
Cardiology Office Note  Date:  09/11/2018   ID:  Miranda [name], DOB [birth year], MRN [id number]  PCP:  Herminio Commons, MD   Chief Complaint  Patient presents with  . other    12 month f/u c/o fatigue. Meds reviewed verbally with pt.    HPI:  Miranda [name] Is a very pleasant 58 y.o. woman with history of  hypertension,   leg swelling. 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 She is feeling tired today and has been feeling like that the past 3 months. Reports heart rates of 40's and averages 140/90 blood pressures at home. Endorses ankle swelling which worsens from standing too long. She wears compression hose.  She is currently not taking Lasix. Still taking potassium tablets 10 mEq daily. She used to drink 6 L of water daily, but dropped to 2 L after feeling too bloated. Has contemplated cutting work hours due to 12+ hour shifts being too exhaustive.   Still compliant with metoprolol succinate 25 mg and losartan 25 mg daily.   Has shortness of breath with exertion. She regularly exercises with a bike.  Feels more tired, something does not feel right  Reports she is not sleeping well, but is something she has had for years.   She is scheduled for a check-up on her Medtronic ICD on 09/19/2018.    Blood pressure 120/90  CR 0.61 Glucose 93  EKG personally reviewed by myself on todays visit Shows sinus bradycardia rhythm. 53 bpm. Otherwise normal ECG.   OTHER  PAST MEDICAL HISTORY REVIEWED BY ME FOR TODAY'S VISIT:    PMH:   has a past medical history of Coronary artery disease, non-occlusive, Dependent edema, Hot flashes, HTN (hypertension), Hypokalemia, IBS (irritable bowel syndrome), Idiopathic Ventricular fibrillation ([place]), Migraines, Nausea, and NICM w/ recovery of LV dysfxn.  PSH:    Past Surgical History:  Procedure Laterality Date  . APPENDECTOMY  1974  . CHOLECYSTECTOMY  1984  . COLONOSCOPY WITH PROPOFOL N/A 05/22/2017   Procedure: COLONOSCOPY WITH PROPOFOL;  Surgeon: Jonathon Bellows, MD;  Location: [facility] ENDOSCOPY;  Service: Gastroenterology;  Laterality: N/A;  . ICD IMPLANT N/A 03/17/2017   Procedure: ICD Implant;  Surgeon: Deboraha Sprang, MD;  Location: [place] CV LAB;  Service: Cardiovascular;  Laterality: N/A;  . LEFT HEART CATH AND CORONARY ANGIOGRAPHY N/A 02/26/2017   Procedure: LEFT HEART CATH AND CORONARY ANGIOGRAPHY;  Surgeon: Nelva Bush, MD;  Location: [place] CV LAB;  Service: Cardiovascular;  Laterality: N/A;  . TONSILLECTOMY  1964    Current Outpatient Medications  Medication Sig Dispense Refill  . aspirin EC 81 MG EC tablet Take 1 tablet (81 mg total) by mouth daily.    Marland Kitchen CREON 36000 units CPEP capsule TAKE 2 CAPSULES (72,000 UNITS TOTAL) BY MOUTH 3 (THREE) TIMES DAILY BEFORE MEALS. 90 capsule 3  . famotidine (PEPCID) 10 MG tablet Take 10 mg by mouth at bedtime.    . potassium chloride SA (K-DUR,KLOR-CON) 20 MEQ  tablet Take 1 tablet (20 mEq total) by mouth daily. 90 tablet 2  . furosemide (LASIX) 20 MG tablet Take 1 tablet (20 mg total) by mouth daily. 90 tablet 3  . losartan (COZAAR) 50 MG tablet Take 1 tablet (50 mg total) by mouth daily. 90 tablet 3  . metoprolol succinate (TOPROL XL) 25 MG 24 hr tablet Take 0.5 tablets (12.5 mg total) by mouth daily. 45 tablet 3   No current facility-administered medications for this visit.      Allergies:   Penicillins; Prochlorperazine; Ace inhibitors; Compazine  [prochlorperazine edisylate]; Lisinopril; and Prochlorperazine maleate   Social History:  The patient  reports that she has never smoked. She has never used smokeless tobacco. She reports that she does not drink alcohol or use drugs.   Family History:   family history includes Heart disease in her father; Hyperlipidemia in her father; Hypertension in her brother, father, and sister; Lymphoma in her mother; Migraines in her sister; Stroke in her father.    Review of Systems: Review of Systems  Constitutional: Positive for malaise/fatigue.  Eyes: Negative.   Respiratory: Positive for shortness of breath.   Cardiovascular: Positive for leg swelling.  Gastrointestinal: Negative.   Genitourinary: Negative.   Musculoskeletal: Negative.   Neurological: Negative.   Psychiatric/Behavioral: Negative.   All other systems reviewed and are negative.    PHYSICAL EXAM: VS:  BP 120/90 (BP Location: Left Arm, Patient Position: Sitting, Cuff Size: Normal)   Pulse (!) 53   Ht _0  (1.549 m)   Wt 200 lb 12 oz (91.1 kg)   BMI 37.93 kg/m  , BMI Body mass index is 37.93 kg/m.  Constitutional:  oriented to person, place, and time. No distress.  HENT:  Head: Grossly normal Eyes:  no discharge. No scleral icterus.  Neck: No JVD, no carotid bruits  Cardiovascular: Regular rate and rhythm, no murmurs appreciated, nonpitting edema bilateral lower extremities. Pulmonary/Chest: Clear to auscultation bilaterally, no wheezes or rales Abdominal: Soft.  no distension.  no tenderness.  Musculoskeletal: Normal range of motion Neurological:  normal muscle tone. Coordination normal. No atrophy Skin: Skin warm and dry Psychiatric: normal affect, pleasant  Recent Labs: 03/17/2018: BUN 17; Creatinine, Ser 0.61; Potassium 4.1; Sodium 145    Lipid Panel Lab Results  Component Value Date   TRIG 119 03/10/2017      Wt Readings from Last 3 Encounters:  09/11/18 200 lb 12 oz (91.1 kg)  03/17/18 188 lb 4 oz  (85.4 kg)  07/28/17 179 lb 8 oz (81.4 kg)      ASSESSMENT AND PLAN:  Hypertension, unspecified type  Plan: EKG 12-Lead Recommend increasing losartan up to 50 mg daily. We did discuss her bradycardia, asymptomatic Suggested she decrease the metoprolol succinate down to 12.5 mg daily Lasix as below  Edema, unspecified type  Plan: EKG 12-Lead Recommend taking Lasix 20 mg daily prn with potassium 20 mg as needed Recommended compression hose for any leg swelling in the summer Decrease salt and fluid intake  Nonsustained VT Plan: Recommend decreasing metoprolol to 12.5 mg daily for possibly symptomatic bradycardia ICD in place, followed by EP  Cardiomyopathy, dilated, nonischemic Prior echocardiogram with normal ejection fraction  Obesity, morbid Weight up tremendously over the past year, 20 pounds Recommended dietary restrictions, walking program  Fatigue Lower metoprolol dose as above, Lab work done today She has poor sleep hygiene,  Recommended weight loss   Total encounter time more than 25 minutes  Greater than 50% was spent in  counseling and coordination of care with the patient   Disposition:   F/U PRN She reports that she is moving to [facility] Medications refilled   Orders Placed This Encounter  Procedures  . CBC with Differential/Platelet  . Comp Met (CMET)  . TSH  . EKG 12-Lead   I, Jesus Reyes am acting as a scribe for Ida Rogue, M.D., Ph.D.  I, Ida Rogue, M.D. Ph.D., have reviewed the above documentation for accuracy and completeness, and I agree with the above.    Signed, Esmond Plants, M.D., Ph.D. 09/11/2018  [place], [place] \

## 2018-09-11 ENCOUNTER — Ambulatory Visit: Payer: BC Managed Care – PPO | Admitting: Cardiovascular Disease

## 2018-09-11 ENCOUNTER — Encounter: Payer: Self-pay | Admitting: Cardiovascular Disease

## 2018-09-11 VITALS — BP 120/90 | HR 53 | Ht 61.0 in | Wt 200.8 lb

## 2018-09-11 DIAGNOSIS — I469 Cardiac arrest, cause unspecified: Secondary | ICD-10-CM

## 2018-09-11 DIAGNOSIS — I4901 Ventricular fibrillation: Secondary | ICD-10-CM | POA: Diagnosis not present

## 2018-09-11 MED ORDER — METOPROLOL SUCCINATE ER 25 MG PO TB24: 12.5000 mg | ORAL_TABLET | Freq: Every day | ORAL | 3 refills | Status: DC

## 2018-09-11 MED ORDER — FUROSEMIDE 20 MG PO TABS: 20.0000 mg | ORAL_TABLET | Freq: Every day | ORAL | 3 refills | Status: AC

## 2018-09-11 MED ORDER — LOSARTAN POTASSIUM 50 MG PO TABS: 50.0000 mg | ORAL_TABLET | Freq: Every day | ORAL | 3 refills | Status: DC

## 2018-09-11 MED ORDER — POTASSIUM CHLORIDE CRYS ER 20 MEQ PO TBCR: 20.0000 meq | EXTENDED_RELEASE_TABLET | Freq: Every day | ORAL | 2 refills | Status: AC

## 2018-09-11 NOTE — Patient Instructions (Addendum)
Medication Instructions:  Your physician has recommended you make the following change in your medication:  1. TAKE Furosemide 20 mg daily as needed with potassium 20 meq 2. INCREASE Losartan to 50 mg daily 3. DECREASE Metoprolol 25 mg to 1/2 tablet (12.5 mg) once daily  If you need a refill on your cardiac medications before your next appointment, please call your pharmacy.    Lab work: CMP, CBC and TSH today   If you have labs (blood work) drawn today and your tests are completely normal, you will receive your results only by: Marland Kitchen MyChart Message (if you have MyChart) OR . A paper copy in the mail If you have any lab test that is abnormal or we need to change your treatment, we will call you to review the results.   Testing/Procedures: No new testing needed   Follow-Up: At [facility], you and your health needs are our priority.  As part of our continuing mission to provide you with exceptional heart care, we have created designated Provider Care Teams.  These Care Teams include your primary Cardiologist (physician) and Advanced Practice Providers (APPs -  Physician Assistants and Nurse Practitioners) who all work together to provide you with the care you need, when you need it.  . You will need a follow up appointment as needed .   Please call our office 2 months in advance to schedule this appointment.    . Providers on your designated Care Team:   . Nicolasa Ducking, NP . Eula Listen, PA-C . Marisue Ivan, PA-C  Any Other Special Instructions Will Be Listed Below (If Applicable).  For educational health videos Log in to : [email or web address] Or : [email or web address], password : [email or web address]

## 2018-09-12 LAB — COMPREHENSIVE METABOLIC PANEL
ALBUMIN: 4.1 g/dL (ref 3.8–4.9)
ALT: 13 IU/L (ref 0–32)
AST: 16 IU/L (ref 0–40)
Albumin/Globulin Ratio: 1.5 (ref 1.2–2.2)
Alkaline Phosphatase: 122 IU/L — ABNORMAL HIGH (ref 39–117)
BUN / CREAT RATIO: 32 — AB (ref 9–23)
BUN: 19 mg/dL (ref 6–24)
Bilirubin Total: 0.4 mg/dL (ref 0.0–1.2)
CO2: 21 mmol/L (ref 20–29)
CREATININE: 0.6 mg/dL (ref 0.57–1.00)
Calcium: 9.1 mg/dL (ref 8.7–10.2)
Chloride: 102 mmol/L (ref 96–106)
GFR calc non Af Amer: 100 mL/min/{1.73_m2} (ref >59)
GFR, EST AFRICAN AMERICAN: 115 mL/min/{1.73_m2} (ref >59)
GLOBULIN, TOTAL: 2.8 g/dL (ref 1.5–4.5)
GLUCOSE: 95 mg/dL (ref 65–99)
Potassium: 4.4 mmol/L (ref 3.5–5.2)
SODIUM: 138 mmol/L (ref 134–144)
TOTAL PROTEIN: 6.9 g/dL (ref 6.0–8.5)

## 2018-09-12 LAB — CBC WITH DIFFERENTIAL/PLATELET
BASOS: 0 %
Basophils Absolute: 0 10*3/uL (ref 0.0–0.2)
EOS (ABSOLUTE): 0.1 10*3/uL (ref 0.0–0.4)
EOS: 2 %
HEMATOCRIT: 37.5 % (ref 34.0–46.6)
Hemoglobin: 12.8 g/dL (ref 11.1–15.9)
IMMATURE GRANULOCYTES: 0 %
Immature Grans (Abs): 0 10*3/uL (ref 0.0–0.1)
LYMPHS ABS: 2 10*3/uL (ref 0.7–3.1)
Lymphs: 34 %
MCH: 30.7 pg (ref 26.6–33.0)
MCHC: 34.1 g/dL (ref 31.5–35.7)
MCV: 90 fL (ref 79–97)
MONOS ABS: 0.4 10*3/uL (ref 0.1–0.9)
Monocytes: 7 %
NEUTROS PCT: 57 %
Neutrophils Absolute: 3.3 10*3/uL (ref 1.4–7.0)
Platelets: 245 10*3/uL (ref 150–450)
RBC: 4.17 x10E6/uL (ref 3.77–5.28)
RDW: 12.6 % (ref 11.7–15.4)
WBC: 5.9 10*3/uL (ref 3.4–10.8)

## 2018-09-12 LAB — TSH: TSH: 1.08 u[IU]/mL (ref 0.450–4.500)

## 2018-09-17 ENCOUNTER — Telehealth: Payer: Self-pay | Admitting: Cardiovascular Disease

## 2018-09-17 ENCOUNTER — Ambulatory Visit (INDEPENDENT_AMBULATORY_CARE_PROVIDER_SITE_OTHER): Payer: BC Managed Care – PPO | Admitting: *Deleted

## 2018-09-17 DIAGNOSIS — I469 Cardiac arrest, cause unspecified: Secondary | ICD-10-CM | POA: Diagnosis not present

## 2018-09-17 DIAGNOSIS — I4901 Ventricular fibrillation: Secondary | ICD-10-CM

## 2018-09-17 NOTE — Telephone Encounter (Signed)
Pt would like to discuss Losartan doseage.

## 2018-09-17 NOTE — Telephone Encounter (Signed)
Losartan 100 mg daily 90 days, 3 refills thx TG

## 2018-09-17 NOTE — Telephone Encounter (Signed)
Returned the pt call. Pt was seen by Dr.Gollan on 09/11/18 and her Losartan was reduced to 50mg  daily. She works at [employer] and ran into [employer] yesterday, she showed him a copy of her recent BP readings. Dr.Gollan adv her to increase Losartan back to 100mg  daily. Pt sts that she will need a refill for Los[facility] 100mg  [facility] [address] Message fwd to Dr.Gollan to confirm.

## 2018-09-18 LAB — CUP PACEART REMOTE DEVICE CHECK
Battery Remaining Longevity: 130 mo
Battery Voltage: 3.03 V
Date Time Interrogation Session: 20200313120658
HighPow Impedance: 80 Ohm
Implantable Lead Implant Date: 20180910
Implantable Lead Location: 753860
Implantable Pulse Generator Implant Date: 20180910
Lead Channel Impedance Value: 513 Ohm
Lead Channel Pacing Threshold Amplitude: 0.5 V
Lead Channel Pacing Threshold Pulse Width: 0.4 ms
Lead Channel Sensing Intrinsic Amplitude: 8.375 mV
Lead Channel Sensing Intrinsic Amplitude: 8.375 mV
Lead Channel Setting Pacing Amplitude: 2.5 V
Lead Channel Setting Sensing Sensitivity: 0.3 mV
MDC IDC MSMT LEADCHNL RV IMPEDANCE VALUE: 589 Ohm
MDC IDC SET LEADCHNL RV PACING PULSEWIDTH: 0.4 ms
MDC IDC STAT BRADY RV PERCENT PACED: 0.06 %

## 2018-09-18 MED ORDER — LOSARTAN POTASSIUM 100 MG PO TABS: 100.0000 mg | ORAL_TABLET | Freq: Every day | ORAL | 3 refills | Status: AC

## 2018-09-18 NOTE — Telephone Encounter (Signed)
RX for losartan [facility] mg daily [facility] [address]

## 2018-09-24 ENCOUNTER — Encounter: Payer: Self-pay | Admitting: Cardiology

## 2018-09-24 NOTE — Progress Notes (Signed)
Remote ICD transmission.   

## 2018-11-01 ENCOUNTER — Encounter: Payer: Self-pay | Admitting: Emergency Medicine

## 2018-11-01 ENCOUNTER — Emergency Department
Admission: EM | Admit: 2018-11-01 | Discharge: 2018-11-01 | Disposition: A | Discharge status: Home / Self Care | Payer: BC Managed Care – PPO | Attending: Emergency Medicine | Admitting: Emergency Medicine

## 2018-11-01 ENCOUNTER — Other Ambulatory Visit: Payer: Self-pay

## 2018-11-01 ENCOUNTER — Emergency Department: Payer: BC Managed Care – PPO

## 2018-11-01 DIAGNOSIS — I2510 Atherosclerotic heart disease of native coronary artery without angina pectoris: Secondary | ICD-10-CM | POA: Insufficient documentation

## 2018-11-01 DIAGNOSIS — I4891 Unspecified atrial fibrillation: Secondary | ICD-10-CM | POA: Insufficient documentation

## 2018-11-01 DIAGNOSIS — R05 Cough: Secondary | ICD-10-CM | POA: Diagnosis present

## 2018-11-01 DIAGNOSIS — I10 Essential (primary) hypertension: Secondary | ICD-10-CM | POA: Diagnosis not present

## 2018-11-01 DIAGNOSIS — Z20828 Contact with and (suspected) exposure to other viral communicable diseases: Secondary | ICD-10-CM | POA: Insufficient documentation

## 2018-11-01 DIAGNOSIS — H1031 Unspecified acute conjunctivitis, right eye: Secondary | ICD-10-CM | POA: Insufficient documentation

## 2018-11-01 DIAGNOSIS — R059 Cough, unspecified: Secondary | ICD-10-CM

## 2018-11-01 DIAGNOSIS — Z7982 Long term (current) use of aspirin: Secondary | ICD-10-CM | POA: Insufficient documentation

## 2018-11-01 LAB — CBC WITH DIFFERENTIAL/PLATELET
Abs Immature Granulocytes: 0.02 10*3/uL (ref 0.00–0.07)
Basophils Absolute: 0 10*3/uL (ref 0.0–0.1)
Basophils Relative: 0 %
Eosinophils Absolute: 0.1 10*3/uL (ref 0.0–0.5)
Eosinophils Relative: 2 %
HCT: 36.6 % (ref 36.0–46.0)
Hemoglobin: 12.3 g/dL (ref 12.0–15.0)
Immature Granulocytes: 0 %
Lymphocytes Relative: 27 %
Lymphs Abs: 1.8 10*3/uL (ref 0.7–4.0)
MCH: 30.8 pg (ref 26.0–34.0)
MCHC: 33.6 g/dL (ref 30.0–36.0)
MCV: 91.5 fL (ref 80.0–100.0)
Monocytes Absolute: 0.4 10*3/uL (ref 0.1–1.0)
Monocytes Relative: 6 %
Neutro Abs: 4.3 10*3/uL (ref 1.7–7.7)
Neutrophils Relative %: 65 %
Platelets: 221 10*3/uL (ref 150–400)
RBC: 4 MIL/uL (ref 3.87–5.11)
RDW: 11.8 % (ref 11.5–15.5)
WBC: 6.7 10*3/uL (ref 4.0–10.5)
nRBC: 0 % (ref 0.0–0.2)

## 2018-11-01 LAB — BASIC METABOLIC PANEL
Anion gap: 9 (ref 5–15)
BUN: 18 mg/dL (ref 6–20)
CO2: 23 mmol/L (ref 22–32)
Calcium: 9 mg/dL (ref 8.9–10.3)
Chloride: 105 mmol/L (ref 98–111)
Creatinine, Ser: 0.6 mg/dL (ref 0.44–1.00)
GFR calc Af Amer: >60 mL/min (ref >60)
GFR calc non Af Amer: >60 mL/min (ref >60)
Glucose, Bld: 106 mg/dL — ABNORMAL HIGH (ref 70–99)
Potassium: 4.3 mmol/L (ref 3.5–5.1)
Sodium: 137 mmol/L (ref 135–145)

## 2018-11-01 LAB — TROPONIN I: Troponin I: <0.03 ng/mL (ref <0.03)

## 2018-11-01 MED ORDER — CIPROFLOXACIN HCL 0.3 % OP SOLN
1.0000 [drp] | Freq: Once | OPHTHALMIC | Status: AC
  Administered 2018-11-01: 1 [drp] via OPHTHALMIC
  Filled 2018-11-01: qty 2.5

## 2018-11-01 NOTE — ED Triage Notes (Signed)
Pt to ED via POV c/o right eye pain since last night and worsening dry cough. Pt is in NAD.

## 2018-11-01 NOTE — Discharge Instructions (Signed)
QUARANTINE INSTRUCTION ° °Follow these instructions at home: ° °Protecting others °To avoid spreading the illness to other people: °Quarantine in your home until you have had no cough and fever for 10 days. Household members should also be quarantine for at least 14 days after being exposed to you. °Wash your hands often with soap and water. If soap and water are not available, use an alcohol-based hand sanitizer. If you have not cleaned your hands, do not touch your face. °Make sure that all people in your household wash their hands well and often. °Cover your nose and mouth when you cough or sneeze. °Throw away used tissues. °Stay home if you have any cold-like or flu-like symptoms. °General instructions °Take over-the-counter and prescription medicines only as told by your health care provider. °If you need medication for fever take tylenol and avoid NSAIDs °Drink enough fluid to keep your urine pale yellow. °Rest at home as directed by your health care provider. °Do not give aspirin to a child with the flu, because of the association with Reye's syndrome. °Do not use tobacco products, including cigarettes, chewing tobacco, and e-cigarettes. If you need help quitting, ask your health care provider. °Keep all follow-up visits as told by your health care provider. This is important. °How is this prevented? °Avoid areas where an outbreak has been reported. °Avoid large groups of people. °Keep a safe distance from people who are coughing and sneezing. °Do not touch your face if you have not cleaned your hands. °When you are around people who are sick or might be sick, wear a mask to protect yourself. °Contact a health care provider if: °You have symptoms of SARS (cough, fever, chest pain, shortness of breath) that are not getting better at home. °You have a fever. °If you have no difficulty breathing contact your doctor or call Poison Control COVID hotline at 1-866-462-3821. If you have difficulty breathing go to  your local ER or call 911  ° °

## 2018-11-01 NOTE — ED Provider Notes (Signed)
[facility] Emergency Department Provider Note  ____________________________________________  Time seen: Approximately 9:10 AM  I have reviewed the triage vital signs and the nursing notes.   HISTORY  Chief Complaint Cough and Eye Pain   HPI Miranda [name] is a 58 y.o. 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  She has a history of chronic cough however over the last week her cough has been more pronounced.  The cough is dry.  She denies chest pain, fever, shortness of breath.  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  Last night and she also started noticing some purulent discharge coming from her right eye.  She denies visual changes.  She uses contact lenses.  She denies headache or fever.  Past Medical History:  Diagnosis Date  . Coronary artery disease, non-occlusive    a. LHC 02/25/2017: no angiographically significant CAD, EF <35%, moderately elevated LVEDP at 28 mmHg.  Marland Kitchen Dependent edema    bilateral legs  . Hot flashes   . HTN (hypertension)   . Hypokalemia   . IBS (irritable bowel syndrome)   . Idiopathic Ventricular fibrillation (HCC)    a. 02/25/2017: LHC without signficant CAD; b. possibly 2/2 diarrhea with electrolyte abnormalities/Imodium usage - K 2.7; c. s/p Medtronic MRI compatible dual chamber AICD, serial number [id number].  . Migraines   . Nausea   . NICM w/ recovery of LV dysfxn    a. TTE 02/26/2017: EF < 20%, diffuse HK, mild AI, RV sys fxn mildly reduced; b. TTE 03/03/2017: EF 55-60%, nl WM, mild AI, PASP 46    Patient Active Problem List   Diagnosis Date Noted  . Acute blood loss anemia   . Hypoalbuminemia due to protein-calorie malnutrition (HCC)   . Benign essential HTN   . Debility    . AKI (acute kidney injury) (HCC) 03/14/2017  . Diarrhea 03/14/2017  . Bradycardia   . Cardiac arrest with ventricular fibrillation (HCC) 02/26/2017  . Cardiac arrest (HCC) 02/26/2017  . Acute respiratory failure (HCC)   . Hypokalemia   . Encounter for central line placement   . Cardiogenic shock (HCC)   . Acute pulmonary edema (HCC)   . Anoxic encephalopathy (HCC)   . Hypertension 08/16/2016  . Edema 08/16/2016  . Nausea 08/16/2016  . Class 2 obesity due to excess calories without serious comorbidity with body mass index (BMI) of 35.0 to 35.9 in adult 08/16/2016  . Open-angle glaucoma 08/13/2012  . Encounter for routine gynecological examination 08/23/2011  . Cortical senile cataract 11/02/2010  . Vitreous degeneration 11/02/2010  . Borderline glaucoma, open angle with borderline findings 11/01/2010  . Myopia 11/01/2010  . Presbyopia 11/01/2010    Past Surgical History:  Procedure Laterality Date  . APPENDECTOMY  1974  . CHOLECYSTECTOMY  1984  . COLONOSCOPY WITH PROPOFOL N/A 05/22/2017   Procedure: COLONOSCOPY WITH PROPOFOL;  Surgeon: Wyline Mood, MD;  Location: [facility] ENDOSCOPY;  Service: Gastroenterology;  Laterality: N/A;  . ICD IMPLANT N/A 03/17/2017   Procedure: ICD Implant;  Surgeon: Duke Salvia, MD;  Location: [facility] INVASIVE CV LAB;  Service: Cardiovascular;  Laterality: N/A;  . LEFT HEART CATH AND CORONARY ANGIOGRAPHY N/A 02/26/2017   Procedure: LEFT HEART CATH AND CORONARY ANGIOGRAPHY;  Surgeon:  End, Cristal Deer, MD;  Location: [facility] INVASIVE CV LAB;  Service: Cardiovascular;  Laterality: N/A;  . TONSILLECTOMY  1964    Prior to Admission medications   Medication Sig Start Date End Date Taking? Authorizing Provider  aspirin EC 81 MG EC tablet Take 1 tablet (81 mg total) by mouth daily. 03/19/17   Sheilah Pigeon, PA-C  CREON 36000 units CPEP capsule TAKE 2 CAPSULES (72,000 UNITS TOTAL) BY MOUTH 3 (THREE) TIMES DAILY BEFORE MEALS. 10/14/17   Wyline Mood, MD  famotidine  (PEPCID) 10 MG tablet Take 10 mg by mouth at bedtime.    [provider]  furosemide (LASIX) 20 MG tablet Take 1 tablet (20 mg total) by mouth daily. 09/11/18 12/10/18  Antonieta Iba, MD  losartan (COZAAR) 100 MG tablet Take 1 tablet (100 mg total) by mouth daily. 09/18/18 12/17/18  Antonieta Iba, MD  metoprolol succinate (TOPROL XL) 25 MG 24 hr tablet Take 0.5 tablets (12.5 mg total) by mouth daily. 09/11/18   Antonieta Iba, MD  potassium chloride SA (K-DUR,KLOR-CON) 20 MEQ tablet Take 1 tablet (20 mEq total) by mouth daily. 09/11/18   Antonieta Iba, MD    Allergies Penicillins; Prochlorperazine; Ace inhibitors; Compazine [prochlorperazine edisylate]; Lisinopril; and Prochlorperazine maleate  Family History  Problem Relation Age of Onset  . Lymphoma Mother   . Heart disease Father   . Stroke Father   . Hyperlipidemia Father   . Hypertension Father   . Hypertension Sister   . Migraines Sister   . Hypertension Brother     Social History Social History   Tobacco Use  . Smoking status: Never Smoker  . Smokeless tobacco: Never Used  Substance Use Topics  . Alcohol use: No  . Drug use: No    Review of Systems  Constitutional: Negative for fever. Eyes: Negative for visual changes. + R eye discharge ENT: Negative for sore throat. Neck: No neck pain  Cardiovascular: Negative for chest pain. Respiratory: Negative for shortness of breath. + cough Gastrointestinal: Negative for abdominal pain, vomiting or diarrhea. Genitourinary: Negative for dysuria. Musculoskeletal: Negative for back pain. Skin: Negative for rash. Neurological: Negative for headaches, weakness or numbness. Psych: No SI or HI  ____________________________________________   PHYSICAL EXAM:  VITAL SIGNS: ED Triage Vitals  Enc Vitals Group     BP 11/01/18 0836 (!) 157/73     Pulse Rate 11/01/18 0836 (!) 55     Resp 11/01/18 0836 18     Temp 11/01/18 0836 97.8 F (36.6 C)     Temp Source  11/01/18 0836 Oral     SpO2 11/01/18 0836 100 %     Weight --      Height --      Head Circumference --      Peak Flow --      Pain Score 11/01/18 0833 6     Pain Loc --      Pain Edu? --      Excl. in GC? --     Constitutional: Alert and oriented. Well appearing and in no apparent distress. HEENT:      Head: Normocephalic and atraumatic.         Eyes: Watery discharge from the R eye with injected conjunctiva, PERRL, EOMI      Mouth/Throat: Mucous membranes are moist.       Neck: Supple with no signs of meningismus. Cardiovascular: Regular rate and rhythm. No murmurs, gallops, or rubs. 2+ symmetrical distal pulses are present in  all extremities. No JVD. Respiratory: Normal respiratory effort. Lungs are clear to auscultation bilaterally. No wheezes, crackles, or rhonchi.  Gastrointestinal: Soft, non tender, and non distended with positive bowel sounds. No rebound or guarding. Musculoskeletal: Nontender with normal range of motion in all extremities. No edema, cyanosis, or erythema of extremities. Neurologic: Normal speech and language. Face is symmetric. Moving all extremities. No gross focal neurologic deficits are appreciated. Skin: Skin is warm, dry and intact. No rash noted. Psychiatric: Mood and affect are normal. Speech and behavior are normal.  ____________________________________________   LABS (all labs ordered are listed, but only abnormal results are displayed)  Labs Reviewed  BASIC METABOLIC PANEL - Abnormal; Notable for the following components:      Result Value   Glucose, Bld 106 (*)    All other components within normal limits  NOVEL CORONAVIRUS, NAA (HOSPITAL ORDER, SEND-OUT TO REF LAB)  CBC WITH DIFFERENTIAL/PLATELET  TROPONIN I   ____________________________________________  EKG  ED ECG REPORT I, Nita Sickle, the attending physician, personally viewed and interpreted this ECG.  Sinus bradycardia, rate of 44, normal intervals, normal axis, no ST  elevations or depressions.  Unchanged from prior. ____________________________________________  RADIOLOGY  I have personally reviewed the images performed during this visit and I agree with the Radiologist's read.   Interpretation by Radiologist:  Dg Chest Portable 1 View  Result Date: 11/01/2018 CLINICAL DATA:  Red tender eyes.  Cough. EXAM: PORTABLE CHEST 1 VIEW COMPARISON:  April 07, 2018 FINDINGS: Cardiomegaly. Stable AICD device. No pneumothorax. No pulmonary nodules, masses, or focal infiltrates. IMPRESSION: No active disease. Electronically Signed   By: Gerome Sam III M.D   On: 11/01/2018 09:13      ____________________________________________   PROCEDURES  Procedure(s) performed: None Procedures Critical Care performed:  None ____________________________________________   INITIAL IMPRESSION / ASSESSMENT AND PLAN / ED COURSE  58 y.o. 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  Patient actively coughing, but with normal work of breathing, normal sats, clear lungs on auscultation.  With her history of V. fib arrest we will get an EKG, basic labs and chest x-ray.  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  She does have evidence of right-sided conjunctivitis.  Extraocular movements are intact, pupils are equal round and reactive.  She wears contact lenses therefore will start patient on ciprofloxacin drops.  Recommended not putting lenses on until 24 hours of no symptoms.    _________________________ 10:24 AM on 11/01/2018 -----------------------------------------  Labs with no acute findings, chest x-ray with no evidence of pulmonary edema or pneumonia.  Discussed quarantine instructions with patient.    Patient was evaluated in Emergency Department today for the symptoms described in the history of present illness. Patient was evaluated in the context of the global COVID-19 pandemic, which necessitated consideration that the patient might be at risk for infection with the SARS-CoV-2 virus that causes COVID-19. Patient is well appearing with normal work of breathing, normal sats, no tachycardia or tachypnea. Institutional protocols and algorithms that pertain to the evaluation of patients at risk for COVID-19 are in a state of rapid change based on information released by regulatory bodies including the CDC and federal and state organizations. These policies and algorithms  were followed during the patient's care in the ED. Discussed quarantine recommendations with patient.    As part of my medical decision making, I reviewed the following data within the electronic MEDICAL RECORD NUMBER Nursing notes reviewed and incorporated, Labs reviewed , EKG interpreted , Old EKG reviewed, Old chart reviewed, Radiograph reviewed , Notes from prior ED visits and [place] Controlled Substance Database    Pertinent labs & imaging results that were available during my care of the patient were reviewed by me and considered in my medical decision making (see chart for details).    ____________________________________________   FINAL CLINICAL IMPRESSION(S) / ED DIAGNOSES  Final diagnoses:  Cough  Acute bacterial conjunctivitis of right eye      NEW MEDICATIONS STARTED DURING THIS VISIT:  ED Discharge Orders    None       Note:  This document was prepared using Dragon voice recognition software and may include unintentional dictation errors.    Don PerkingVeronese, [place], MD 11/01/18 1025

## 2018-11-02 LAB — NOVEL CORONAVIRUS, NAA (HOSP ORDER, SEND-OUT TO REF LAB; TAT 18-24 HRS): SARS-CoV-2, NAA: NOT DETECTED

## 2018-11-26 ENCOUNTER — Other Ambulatory Visit: Payer: Self-pay | Admitting: *Deleted

## 2018-11-26 MED ORDER — METOPROLOL SUCCINATE ER 25 MG PO TB24: 12.5000 mg | ORAL_TABLET | Freq: Every day | ORAL | 1 refills | Status: AC

## 2018-12-17 ENCOUNTER — Ambulatory Visit (INDEPENDENT_AMBULATORY_CARE_PROVIDER_SITE_OTHER): Payer: BC Managed Care – PPO | Admitting: *Deleted

## 2018-12-17 DIAGNOSIS — I469 Cardiac arrest, cause unspecified: Secondary | ICD-10-CM

## 2018-12-18 LAB — CUP PACEART REMOTE DEVICE CHECK
Battery Remaining Longevity: 128 mo
Battery Voltage: 3.03 V
Brady Statistic RV Percent Paced: 0.3 %
Date Time Interrogation Session: 20200611210402
HighPow Impedance: 77 Ohm
Implantable Lead Implant Date: 20180910
Implantable Lead Location: 753860
Implantable Pulse Generator Implant Date: 20180910
Lead Channel Impedance Value: 589 Ohm
Lead Channel Impedance Value: 665 Ohm
Lead Channel Pacing Threshold Amplitude: 0.5 V
Lead Channel Pacing Threshold Pulse Width: 0.4 ms
Lead Channel Sensing Intrinsic Amplitude: 8.375 mV
Lead Channel Sensing Intrinsic Amplitude: 8.375 mV
Lead Channel Setting Pacing Amplitude: 2.5 V
Lead Channel Setting Pacing Pulse Width: 0.4 ms
Lead Channel Setting Sensing Sensitivity: 0.3 mV

## 2018-12-24 ENCOUNTER — Encounter: Payer: Self-pay | Admitting: Cardiology

## 2018-12-24 NOTE — Progress Notes (Signed)
Remote ICD transmission.   

## 2019-03-10 IMAGING — DX DG CHEST 1V PORT
1 series · 1 of 1 positions shown · non-contrast
Comparison: None.

CLINICAL DATA: Status post intubation.

EXAM:
PORTABLE CHEST 1 VIEW

[chest ap]
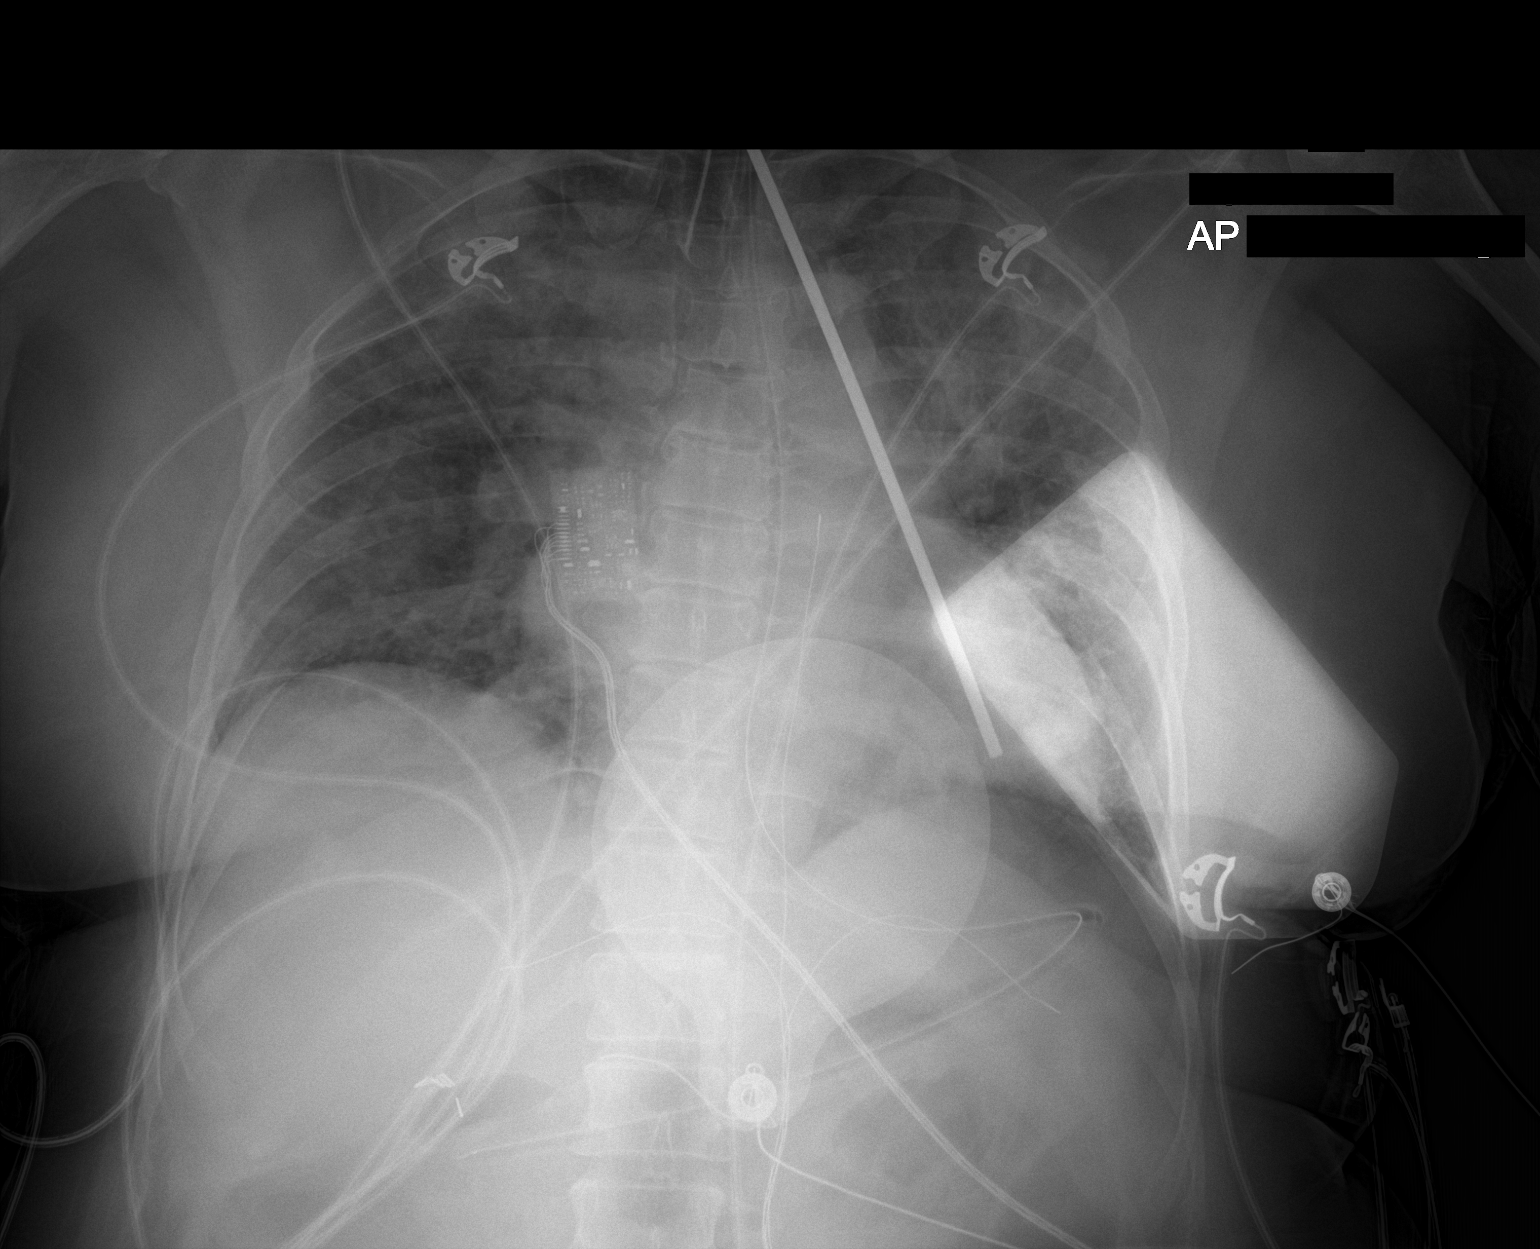

[1 of 1 positions shown; findings below may reference images not displayed]

FINDINGS: The heart size and mediastinal contours are within normal limits.
Endotracheal tube is seen projected over tracheal air shadow with
distal tip 4 cm above the carina. Nasogastric tube is seen looped
within proximal stomach. Bilateral lung opacities are noted
concerning for edema or possibly inflammation. No definite
pneumothorax or pleural effusion is noted. The visualized skeletal
structures are unremarkable.
IMPRESSION: Endotracheal and nasogastric tubes in grossly good position. Mild
bilateral lung opacities are noted concerning for edema or possibly
inflammation.

## 2019-03-10 IMAGING — CT CT ANGIO CHEST
3 of 7 series · 17 of 46 positions shown · IV contrast (APPLIED)
Comparison: Radiograph of same day.

CLINICAL DATA: Unwitnessed fall, unresponsive.

EXAM:
CT ANGIOGRAPHY CHEST WITH CONTRAST
TECHNIQUE: Multidetector CT imaging of the chest was performed using the
standard protocol during bolus administration of intravenous
contrast. Multiplanar CT image reconstructions and MIPs were
obtained to evaluate the vascular anatomy.
CONTRAST:  100 mL of Isovue 370 intravenously.

[Series 6: axial arterial · axial · arterial · 0.75mm/px · z∈[+223,+442]mm · 12 of 87 slices shown]
[im 7/87  lung]
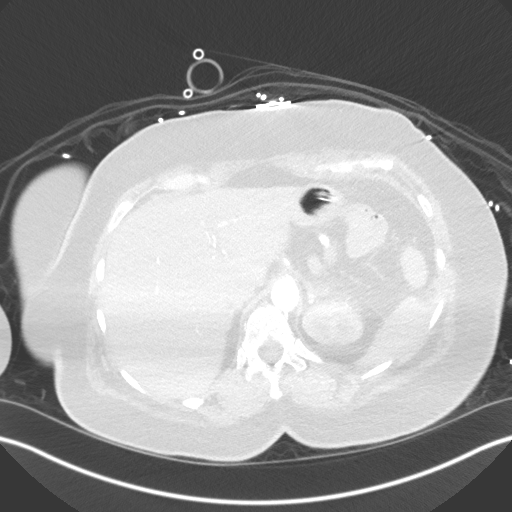
[im 14/87  soft-tissue]
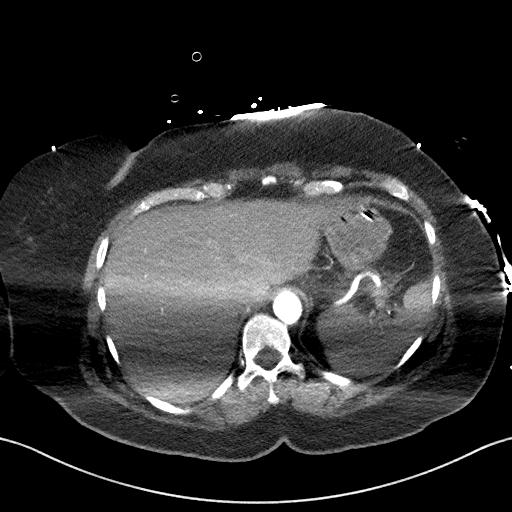
[im 20/87  lung]
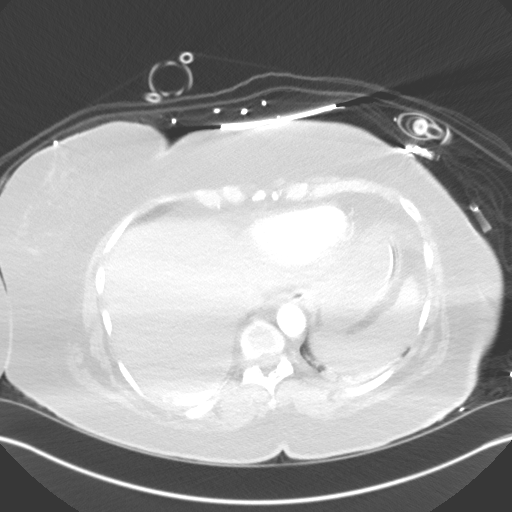
[im 27/87  soft-tissue]
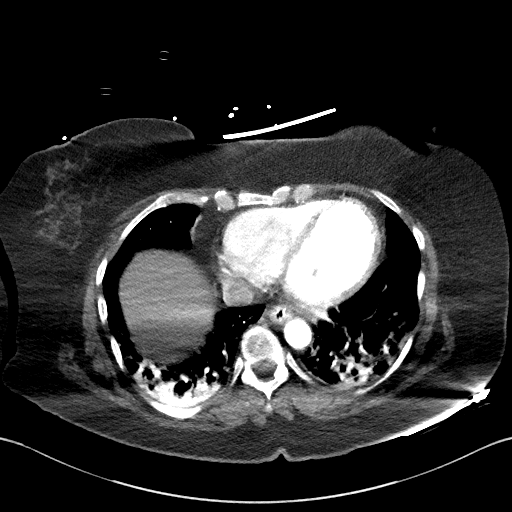
[im 34/87  lung]
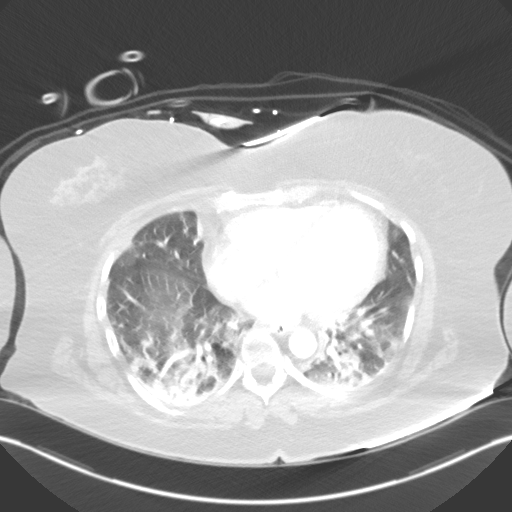
[im 40/87  soft-tissue]
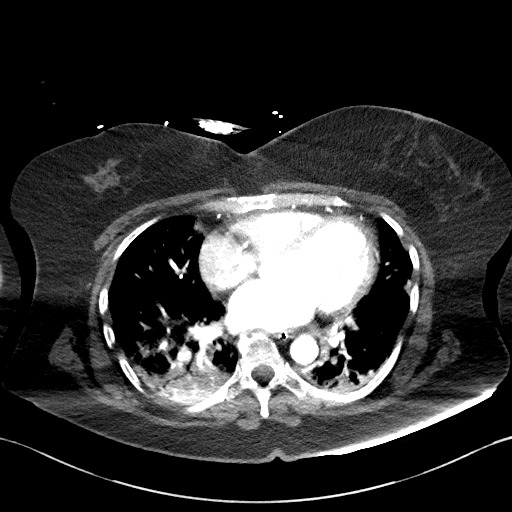
[im 47/87  lung]
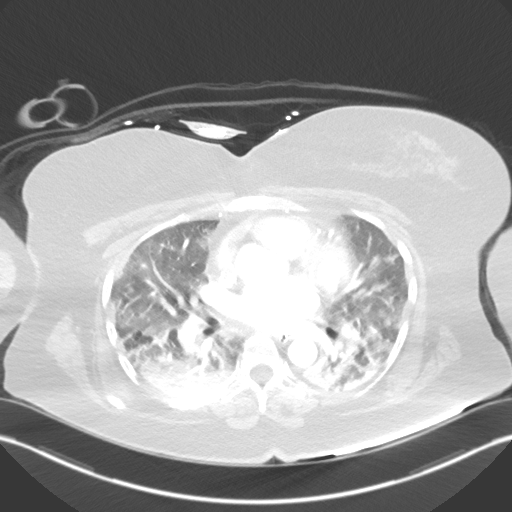
[im 53/87  soft-tissue]
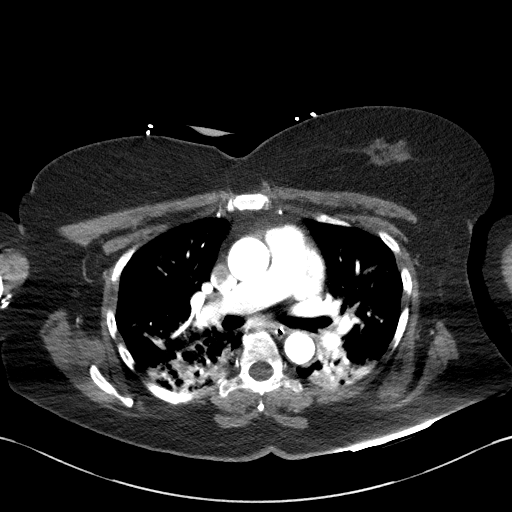
[im 60/87  lung]
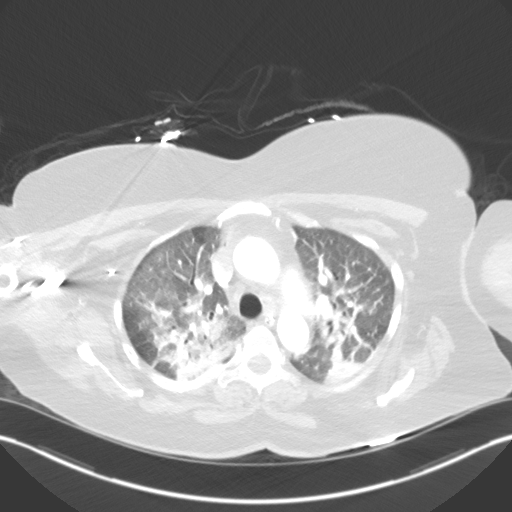
[im 67/87  soft-tissue]
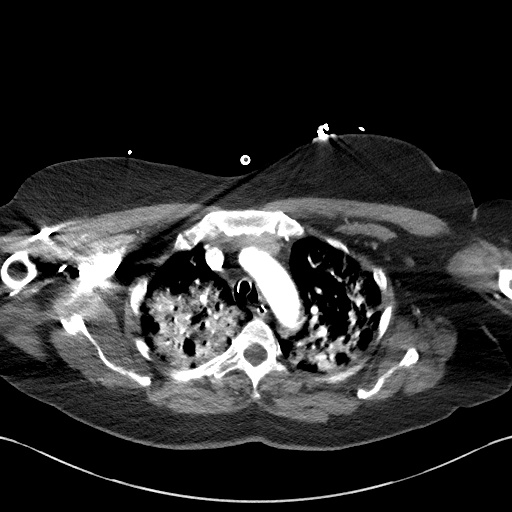
[im 73/87  lung]
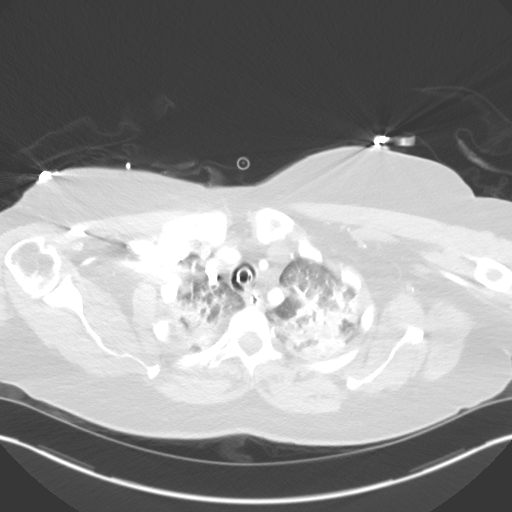
[im 80/87  soft-tissue]
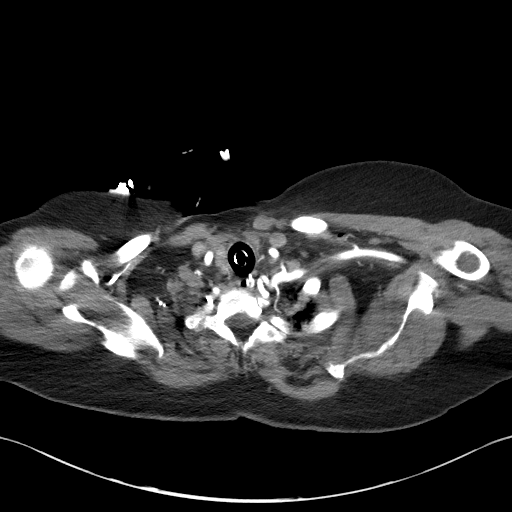

[Series 7: lung · axial · 0.75mm/px · z∈[+233,+268]mm · 2 of 53 slices shown]
[im 7/53  soft-tissue]
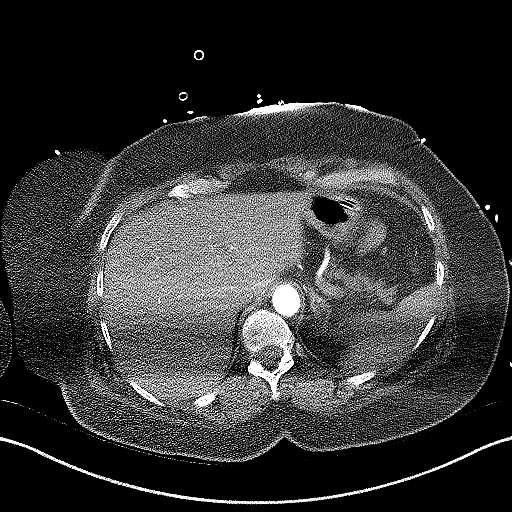
[im 14/53  soft-tissue]
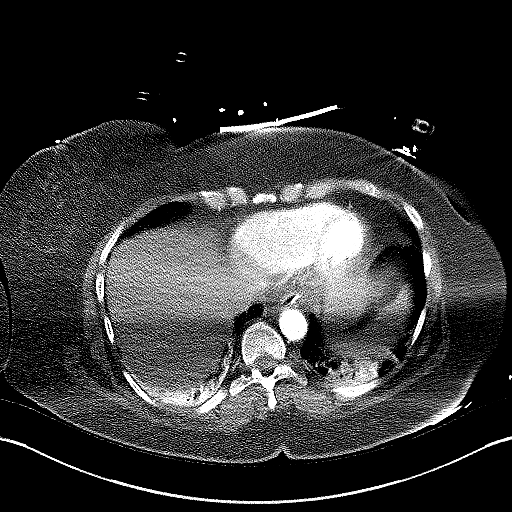

[Series 8: coronals · coronal · 0.53mm/px · 3 of 125 slices shown]
[im 32/125  soft-tissue]
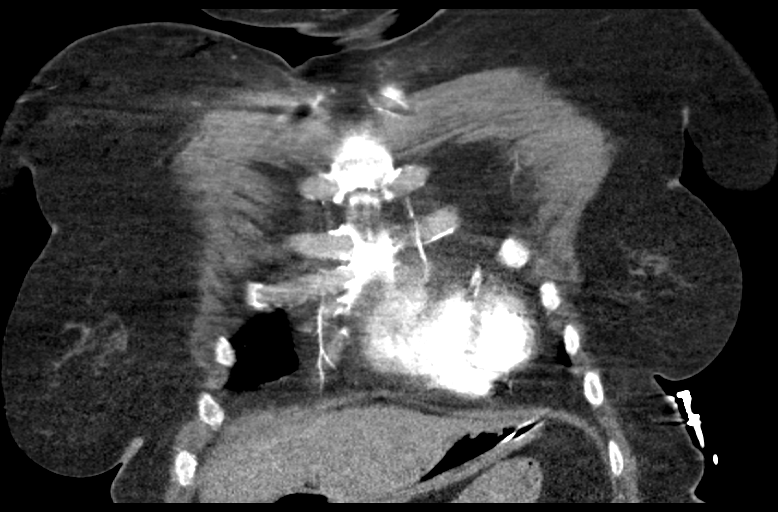
[im 63/125  soft-tissue]
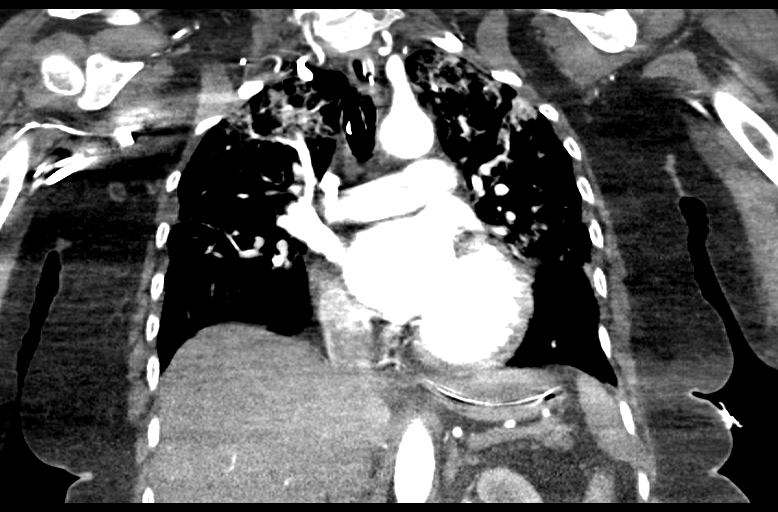
[im 94/125  soft-tissue]
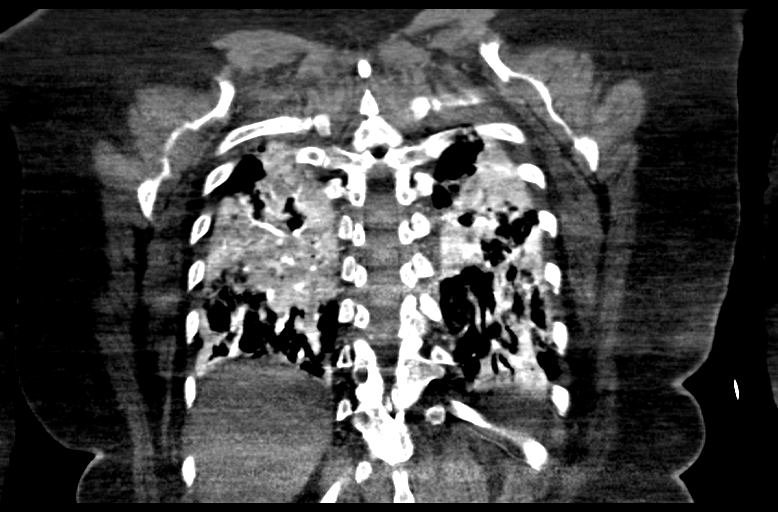

[17 of 46 positions shown; findings below may reference images not displayed]

FINDINGS: Cardiovascular: Suboptimal opacification of pulmonary arteries is
noted due to timing of contrast bolus as well as respiratory motion
artifact. Large central pulmonary embolus is not identified in the
main pulmonary artery or the main portions of the right or left
pulmonary emboli. However, smaller peripheral pulmonary emboli
cannot be excluded on the basis of this exam. There is no evidence
of thoracic aortic dissection or aneurysm. No pericardial effusion
is noted.

Mediastinum/Nodes: Endotracheal tube is in grossly good position. No
mediastinal adenopathy is noted. Thyroid gland is unremarkable.

Lungs/Pleura: No pneumothorax or pleural effusion is noted.
Bilateral diffuse lung opacities are noted concerning for edema or
pneumonia.

Upper Abdomen: Nasogastric tube tip is seen in distal stomach.

Musculoskeletal: No chest wall abnormality. No acute or significant
osseous findings.

Review of the MIP images confirms the above findings.
IMPRESSION: Suboptimal opacification of pulmonary arteries is noted due to
timing of contrast bolus as well as respiratory motion artifact.
Large central pulmonary embolus is not identified, but smaller
peripheral emboli cannot be excluded on the basis of this exam.

Bilateral lung opacities are noted concerning for edema or
pneumonia.

Endotracheal and nasogastric tubes appear to be in good position.

## 2019-03-18 ENCOUNTER — Encounter: Payer: BC Managed Care – PPO | Admitting: *Deleted

## 2019-04-07 ENCOUNTER — Ambulatory Visit (INDEPENDENT_AMBULATORY_CARE_PROVIDER_SITE_OTHER): Payer: BC Managed Care – PPO | Admitting: *Deleted

## 2019-04-07 DIAGNOSIS — I469 Cardiac arrest, cause unspecified: Secondary | ICD-10-CM

## 2019-04-07 LAB — CUP PACEART REMOTE DEVICE CHECK
Battery Remaining Longevity: 126 mo
Battery Voltage: 3.02 V
Brady Statistic RV Percent Paced: 0.11 %
Date Time Interrogation Session: 20200930093243
HighPow Impedance: 75 Ohm
Implantable Lead Implant Date: 20180910
Implantable Lead Location: 753860
Implantable Pulse Generator Implant Date: 20180910
Lead Channel Impedance Value: 532 Ohm
Lead Channel Impedance Value: 570 Ohm
Lead Channel Pacing Threshold Amplitude: 0.5 V
Lead Channel Pacing Threshold Pulse Width: 0.4 ms
Lead Channel Sensing Intrinsic Amplitude: 8.75 mV
Lead Channel Sensing Intrinsic Amplitude: 8.75 mV
Lead Channel Setting Pacing Amplitude: 2.5 V
Lead Channel Setting Pacing Pulse Width: 0.4 ms
Lead Channel Setting Sensing Sensitivity: 0.3 mV

## 2019-04-14 ENCOUNTER — Encounter: Payer: Self-pay | Admitting: Cardiology

## 2019-04-14 NOTE — Progress Notes (Signed)
Remote ICD transmission.
